# Patient Record
Sex: Male | Born: 1935 | Race: Black or African American | Hispanic: No | Marital: Married | Smoking: Current every day smoker
Health system: Southern US, Community
[De-identification: ages and names within clinical notes are randomized; demographics above are authoritative.]

## PROBLEM LIST (undated history)

## (undated) ENCOUNTER — Emergency Department (HOSPITAL_COMMUNITY): Payer: Medicare Other

## (undated) DIAGNOSIS — I2510 Atherosclerotic heart disease of native coronary artery without angina pectoris: Secondary | ICD-10-CM

## (undated) DIAGNOSIS — G119 Hereditary ataxia, unspecified: Secondary | ICD-10-CM

## (undated) DIAGNOSIS — D649 Anemia, unspecified: Secondary | ICD-10-CM

## (undated) DIAGNOSIS — R29898 Other symptoms and signs involving the musculoskeletal system: Secondary | ICD-10-CM

## (undated) DIAGNOSIS — C61 Malignant neoplasm of prostate: Secondary | ICD-10-CM

## (undated) DIAGNOSIS — I639 Cerebral infarction, unspecified: Secondary | ICD-10-CM

## (undated) DIAGNOSIS — N189 Chronic kidney disease, unspecified: Secondary | ICD-10-CM

## (undated) DIAGNOSIS — I10 Essential (primary) hypertension: Secondary | ICD-10-CM

## (undated) DIAGNOSIS — C801 Malignant (primary) neoplasm, unspecified: Secondary | ICD-10-CM

## (undated) DIAGNOSIS — R0789 Other chest pain: Secondary | ICD-10-CM

## (undated) HISTORY — DX: Hereditary ataxia, unspecified: G11.9

## (undated) HISTORY — DX: Other symptoms and signs involving the musculoskeletal system: R29.898

## (undated) HISTORY — DX: Cerebral infarction, unspecified: I63.9

## (undated) HISTORY — DX: Chronic kidney disease, unspecified: N18.9

---
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
## 2003-08-23 ENCOUNTER — Encounter: Admission: RE | Admit: 2003-08-23 | Discharge: 2003-08-23 | Payer: Self-pay | Admitting: General Practice

## 2004-04-17 ENCOUNTER — Emergency Department (HOSPITAL_COMMUNITY): Admission: EM | Admit: 2004-04-17 | Discharge: 2004-04-17 | Payer: Self-pay | Admitting: Emergency Medicine

## 2004-04-29 ENCOUNTER — Emergency Department (HOSPITAL_COMMUNITY): Admission: EM | Admit: 2004-04-29 | Discharge: 2004-04-29 | Payer: Self-pay | Admitting: Emergency Medicine

## 2004-06-05 ENCOUNTER — Ambulatory Visit (HOSPITAL_COMMUNITY): Admission: RE | Admit: 2004-06-05 | Discharge: 2004-06-05 | Payer: Self-pay | Admitting: Urology

## 2004-06-17 ENCOUNTER — Ambulatory Visit: Admission: RE | Admit: 2004-06-17 | Discharge: 2004-09-15 | Payer: Self-pay | Admitting: Radiation Oncology

## 2004-09-16 ENCOUNTER — Ambulatory Visit: Admission: RE | Admit: 2004-09-16 | Discharge: 2004-12-06 | Payer: Self-pay | Admitting: Radiation Oncology

## 2006-01-05 ENCOUNTER — Emergency Department (HOSPITAL_COMMUNITY): Admission: EM | Admit: 2006-01-05 | Discharge: 2006-01-06 | Payer: Self-pay | Admitting: Emergency Medicine

## 2006-07-07 ENCOUNTER — Ambulatory Visit (HOSPITAL_COMMUNITY): Admission: RE | Admit: 2006-07-07 | Discharge: 2006-07-07 | Payer: Self-pay | Admitting: Family Medicine

## 2006-11-14 ENCOUNTER — Inpatient Hospital Stay (HOSPITAL_COMMUNITY): Admission: EM | Admit: 2006-11-14 | Discharge: 2006-11-17 | Payer: Self-pay | Admitting: Emergency Medicine

## 2006-11-16 ENCOUNTER — Ambulatory Visit: Payer: Self-pay | Admitting: Vascular Surgery

## 2006-11-16 ENCOUNTER — Encounter (INDEPENDENT_AMBULATORY_CARE_PROVIDER_SITE_OTHER): Payer: Self-pay | Admitting: Interventional Cardiology

## 2006-12-24 ENCOUNTER — Encounter: Payer: Self-pay | Admitting: Internal Medicine

## 2006-12-24 DIAGNOSIS — C180 Malignant neoplasm of cecum: Secondary | ICD-10-CM

## 2007-01-08 DIAGNOSIS — C801 Malignant (primary) neoplasm, unspecified: Secondary | ICD-10-CM

## 2007-01-08 HISTORY — DX: Malignant (primary) neoplasm, unspecified: C80.1

## 2007-01-19 ENCOUNTER — Ambulatory Visit: Payer: Self-pay | Admitting: Internal Medicine

## 2007-02-06 ENCOUNTER — Inpatient Hospital Stay (HOSPITAL_COMMUNITY): Admission: RE | Admit: 2007-02-06 | Discharge: 2007-02-11 | Payer: Self-pay | Admitting: General Surgery

## 2007-02-06 HISTORY — PX: CHOLECYSTECTOMY: SHX55

## 2007-02-06 HISTORY — PX: HEMICOLECTOMY: SHX854

## 2007-02-07 ENCOUNTER — Encounter (INDEPENDENT_AMBULATORY_CARE_PROVIDER_SITE_OTHER): Payer: Self-pay | Admitting: General Surgery

## 2007-02-18 ENCOUNTER — Ambulatory Visit: Payer: Self-pay | Admitting: Oncology

## 2007-03-11 LAB — CBC WITH DIFFERENTIAL/PLATELET
BASO%: 0.6 % (ref 0.0–2.0)
EOS%: 19.3 % — ABNORMAL HIGH (ref 0.0–7.0)
Eosinophils Absolute: 1.2 10*3/uL — ABNORMAL HIGH (ref 0.0–0.5)
MCHC: 31.8 g/dL — ABNORMAL LOW (ref 32.0–35.9)
MCV: 75.3 fL — ABNORMAL LOW (ref 81.6–98.0)
MONO%: 7.3 % (ref 0.0–13.0)
NEUT#: 3 10*3/uL (ref 1.5–6.5)
RBC: 4.19 10*6/uL — ABNORMAL LOW (ref 4.20–5.71)
RDW: 24.3 % — ABNORMAL HIGH (ref 11.2–14.6)

## 2007-03-14 LAB — IRON AND TIBC
%SAT: 12 % — ABNORMAL LOW (ref 20–55)
TIBC: 435 ug/dL (ref 215–435)

## 2007-03-14 LAB — FERRITIN: Ferritin: 16 ng/mL — ABNORMAL LOW (ref 22–322)

## 2007-03-14 LAB — COMPREHENSIVE METABOLIC PANEL
ALT: 11 U/L (ref 0–53)
AST: 15 U/L (ref 0–37)
Albumin: 4.3 g/dL (ref 3.5–5.2)
Alkaline Phosphatase: 90 U/L (ref 39–117)
Glucose, Bld: 70 mg/dL (ref 70–99)
Potassium: 3.9 mEq/L (ref 3.5–5.3)
Sodium: 139 mEq/L (ref 135–145)
Total Protein: 8.2 g/dL (ref 6.0–8.3)

## 2007-03-14 LAB — TRANSFERRIN RECEPTOR, SOLUABLE: Transferrin Receptor, Soluble: 41.3 nmol/L

## 2007-04-04 ENCOUNTER — Ambulatory Visit (HOSPITAL_COMMUNITY): Admission: RE | Admit: 2007-04-04 | Discharge: 2007-04-04 | Payer: Self-pay | Admitting: Oncology

## 2007-04-07 ENCOUNTER — Ambulatory Visit: Payer: Self-pay | Admitting: Oncology

## 2007-05-20 ENCOUNTER — Ambulatory Visit: Payer: Self-pay | Admitting: Oncology

## 2007-12-16 IMAGING — CR DG CHEST 2V
2 series · 2 of 2 positions shown · non-contrast
Comparison: 11/15/06.

CLINICAL DATA: Colon cancer.  Preop radiograph. 
 CHEST ? 2 VIEW:

[w chest pa]
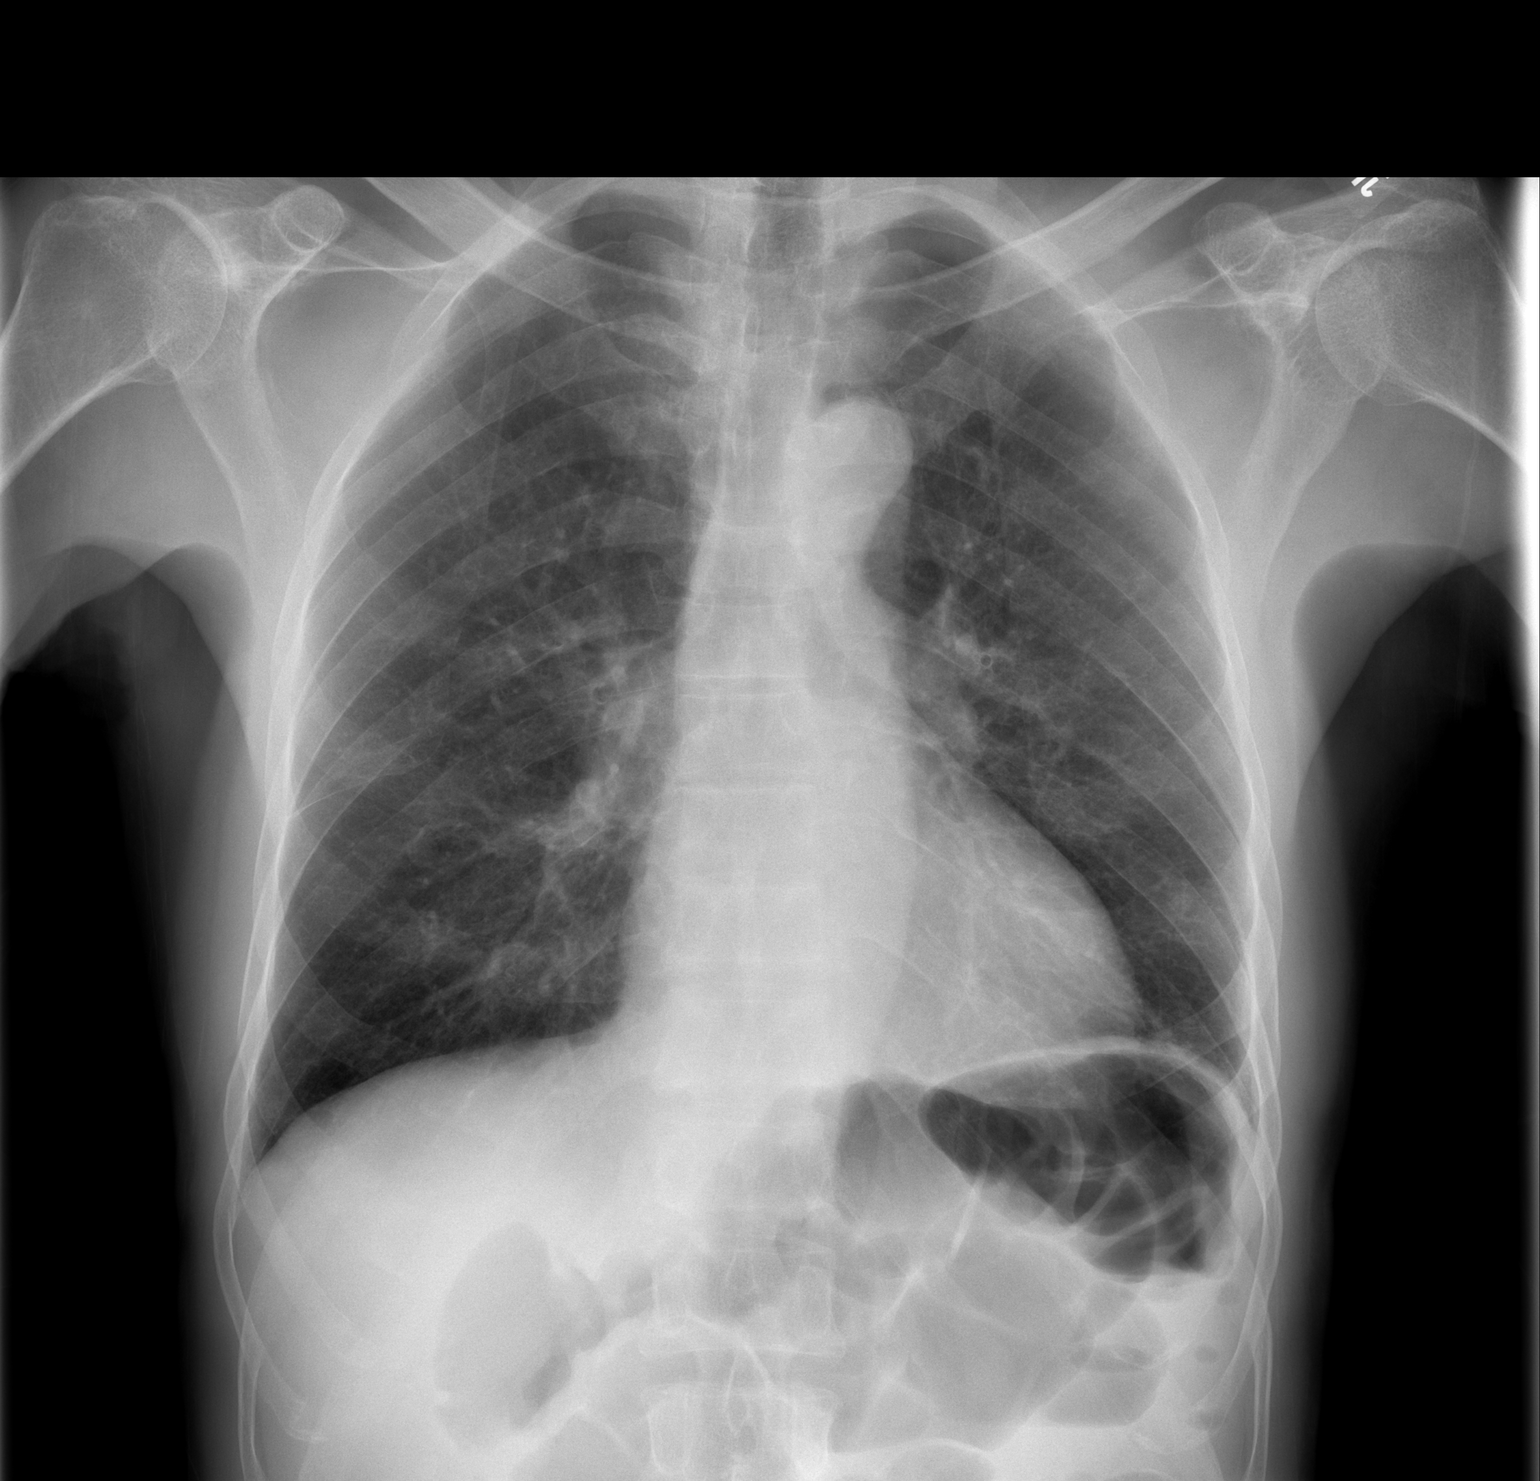

[w chest lat]
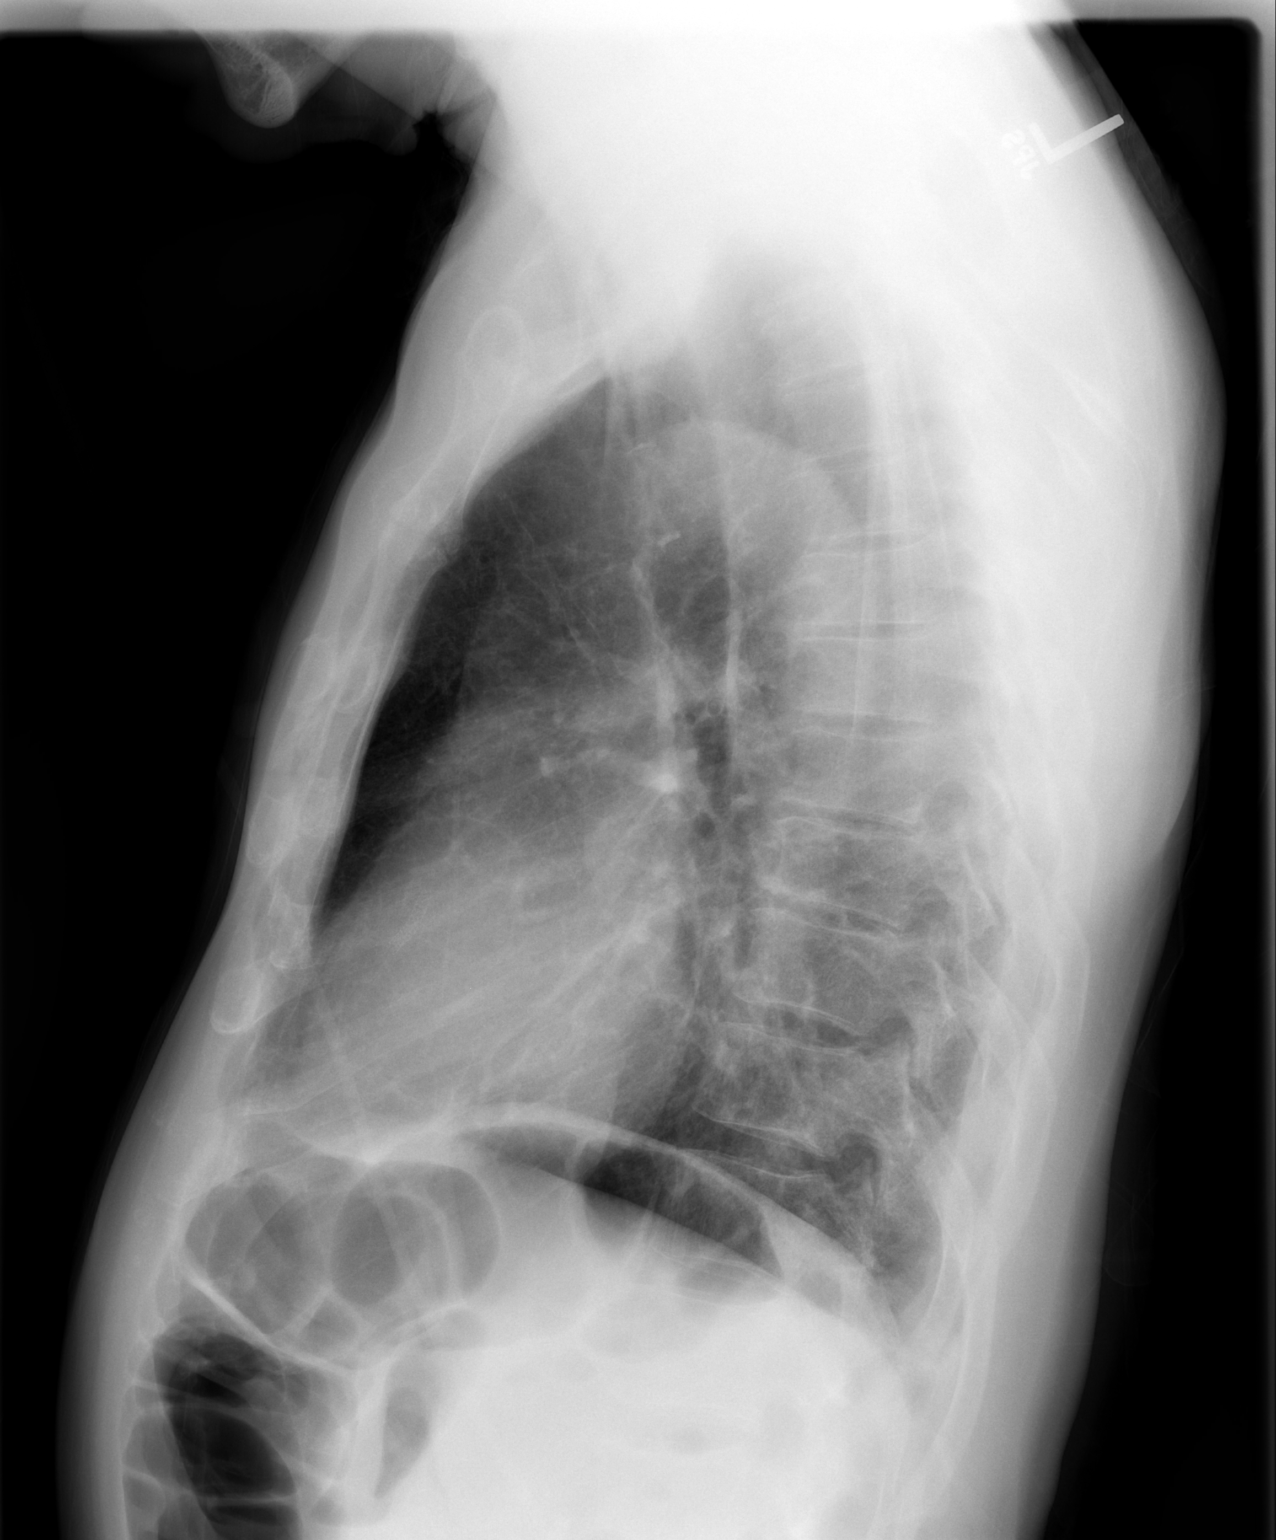

[2 of 2 positions shown; findings below may reference images not displayed]

FINDINGS: Heart size is mildly enlarged.  
 There is no pleural fluid or interstitial edema.  
 No airspace opacities are identified. 
 There are coarsened interstitial markings consistent with COPD/emphysema.
IMPRESSION: 1.  Chronic interstitial change.  
 2.  No acute cardiopulmonary abnormalities.

## 2008-03-02 ENCOUNTER — Emergency Department (HOSPITAL_COMMUNITY): Admission: EM | Admit: 2008-03-02 | Discharge: 2008-03-02 | Payer: Self-pay | Admitting: Emergency Medicine

## 2009-11-11 ENCOUNTER — Inpatient Hospital Stay (HOSPITAL_COMMUNITY)
Admission: EM | Admit: 2009-11-11 | Discharge: 2009-11-22 | Payer: Self-pay | Source: Home / Self Care | Admitting: Emergency Medicine

## 2010-03-09 DIAGNOSIS — I639 Cerebral infarction, unspecified: Secondary | ICD-10-CM

## 2010-03-09 HISTORY — DX: Cerebral infarction, unspecified: I63.9

## 2010-03-30 ENCOUNTER — Encounter (HOSPITAL_COMMUNITY): Payer: Self-pay | Admitting: Oncology

## 2010-04-29 ENCOUNTER — Emergency Department (HOSPITAL_COMMUNITY)
Admission: EM | Admit: 2010-04-29 | Discharge: 2010-04-29 | Disposition: A | Discharge status: Home / Self Care | Payer: No Typology Code available for payment source | Attending: Emergency Medicine | Admitting: Emergency Medicine

## 2010-04-29 ENCOUNTER — Emergency Department (HOSPITAL_COMMUNITY): Payer: No Typology Code available for payment source

## 2010-04-29 ENCOUNTER — Encounter (HOSPITAL_COMMUNITY): Payer: Self-pay | Admitting: Radiology

## 2010-04-29 DIAGNOSIS — E7800 Pure hypercholesterolemia, unspecified: Secondary | ICD-10-CM | POA: Insufficient documentation

## 2010-04-29 DIAGNOSIS — R1011 Right upper quadrant pain: Secondary | ICD-10-CM | POA: Insufficient documentation

## 2010-04-29 DIAGNOSIS — S20219A Contusion of unspecified front wall of thorax, initial encounter: Secondary | ICD-10-CM | POA: Insufficient documentation

## 2010-04-29 DIAGNOSIS — M542 Cervicalgia: Secondary | ICD-10-CM | POA: Insufficient documentation

## 2010-04-29 DIAGNOSIS — I10 Essential (primary) hypertension: Secondary | ICD-10-CM | POA: Insufficient documentation

## 2010-04-29 DIAGNOSIS — IMO0001 Reserved for inherently not codable concepts without codable children: Secondary | ICD-10-CM | POA: Insufficient documentation

## 2010-04-29 HISTORY — DX: Essential (primary) hypertension: I10

## 2010-04-29 LAB — POCT I-STAT, CHEM 8
BUN: 20 mg/dL (ref 6–23)
Potassium: 3.8 mEq/L (ref 3.5–5.1)
Sodium: 139 mEq/L (ref 135–145)
TCO2: 24 mmol/L (ref 0–100)

## 2010-04-29 MED ORDER — IOHEXOL 300 MG/ML  SOLN
100.0000 mL | Freq: Once | INTRAMUSCULAR | Status: AC | PRN
  Administered 2010-04-29: 100 mL via INTRAVENOUS

## 2010-05-22 LAB — URINE MICROSCOPIC-ADD ON

## 2010-05-22 LAB — COMPREHENSIVE METABOLIC PANEL
ALT: 10 U/L (ref 0–53)
ALT: 10 U/L (ref 0–53)
AST: 19 U/L (ref 0–37)
Albumin: 3.5 g/dL (ref 3.5–5.2)
Albumin: 4 g/dL (ref 3.5–5.2)
Alkaline Phosphatase: 79 U/L (ref 39–117)
Alkaline Phosphatase: 81 U/L (ref 39–117)
BUN: 15 mg/dL (ref 6–23)
CO2: 27 mEq/L (ref 19–32)
Calcium: 10.7 mg/dL — ABNORMAL HIGH (ref 8.4–10.5)
Chloride: 102 mEq/L (ref 96–112)
Chloride: 104 mEq/L (ref 96–112)
Creatinine, Ser: 1.42 mg/dL (ref 0.4–1.5)
GFR calc Af Amer: 58 mL/min — ABNORMAL LOW (ref >60)
GFR calc non Af Amer: 48 mL/min — ABNORMAL LOW (ref >60)
Glucose, Bld: 135 mg/dL — ABNORMAL HIGH (ref 70–99)
Glucose, Bld: 144 mg/dL — ABNORMAL HIGH (ref 70–99)
Potassium: 3.8 mEq/L (ref 3.5–5.1)
Potassium: 4 mEq/L (ref 3.5–5.1)
Sodium: 137 mEq/L (ref 135–145)
Sodium: 139 mEq/L (ref 135–145)
Total Bilirubin: 0.5 mg/dL (ref 0.3–1.2)
Total Protein: 7.8 g/dL (ref 6.0–8.3)
Total Protein: 8.6 g/dL — ABNORMAL HIGH (ref 6.0–8.3)

## 2010-05-22 LAB — BASIC METABOLIC PANEL
BUN: 11 mg/dL (ref 6–23)
BUN: 18 mg/dL (ref 6–23)
BUN: 23 mg/dL (ref 6–23)
CO2: 24 mEq/L (ref 19–32)
CO2: 24 mEq/L (ref 19–32)
CO2: 28 mEq/L (ref 19–32)
CO2: 29 mEq/L (ref 19–32)
CO2: 30 mEq/L (ref 19–32)
Calcium: 8.9 mg/dL (ref 8.4–10.5)
Calcium: 10.1 mg/dL (ref 8.4–10.5)
Calcium: 10.4 mg/dL (ref 8.4–10.5)
Calcium: 9.1 mg/dL (ref 8.4–10.5)
Calcium: 9.4 mg/dL (ref 8.4–10.5)
Calcium: 9.5 mg/dL (ref 8.4–10.5)
Chloride: 103 mEq/L (ref 96–112)
Chloride: 103 mEq/L (ref 96–112)
Creatinine, Ser: 1.04 mg/dL (ref 0.4–1.5)
Creatinine, Ser: 1.11 mg/dL (ref 0.4–1.5)
Creatinine, Ser: 1.36 mg/dL (ref 0.4–1.5)
Creatinine, Ser: 1.37 mg/dL (ref 0.4–1.5)
GFR calc Af Amer: >60 mL/min (ref >60)
GFR calc Af Amer: >60 mL/min (ref >60)
GFR calc Af Amer: >60 mL/min (ref >60)
GFR calc Af Amer: >60 mL/min (ref >60)
GFR calc Af Amer: >60 mL/min (ref >60)
GFR calc non Af Amer: >60 mL/min (ref >60)
GFR calc non Af Amer: 50 mL/min — ABNORMAL LOW (ref >60)
GFR calc non Af Amer: >60 mL/min (ref >60)
GFR calc non Af Amer: >60 mL/min (ref >60)
Glucose, Bld: 113 mg/dL — ABNORMAL HIGH (ref 70–99)
Glucose, Bld: 127 mg/dL — ABNORMAL HIGH (ref 70–99)
Glucose, Bld: 129 mg/dL — ABNORMAL HIGH (ref 70–99)
Glucose, Bld: 137 mg/dL — ABNORMAL HIGH (ref 70–99)
Potassium: 4.1 mEq/L (ref 3.5–5.1)
Potassium: 3.8 mEq/L (ref 3.5–5.1)
Potassium: 4.4 mEq/L (ref 3.5–5.1)
Sodium: 131 mEq/L — ABNORMAL LOW (ref 135–145)
Sodium: 135 mEq/L (ref 135–145)
Sodium: 140 mEq/L (ref 135–145)
Sodium: 143 mEq/L (ref 135–145)

## 2010-05-22 LAB — CBC
HCT: 37 % — ABNORMAL LOW (ref 39.0–52.0)
HCT: 37.3 % — ABNORMAL LOW (ref 39.0–52.0)
HCT: 39 % (ref 39.0–52.0)
HCT: 40.5 % (ref 39.0–52.0)
HCT: 42.3 % (ref 39.0–52.0)
HCT: 44.9 % (ref 39.0–52.0)
Hemoglobin: 12.8 g/dL — ABNORMAL LOW (ref 13.0–17.0)
Hemoglobin: 12.8 g/dL — ABNORMAL LOW (ref 13.0–17.0)
Hemoglobin: 13.3 g/dL (ref 13.0–17.0)
Hemoglobin: 13.4 g/dL (ref 13.0–17.0)
Hemoglobin: 15.2 g/dL (ref 13.0–17.0)
MCH: 28.7 pg (ref 26.0–34.0)
MCH: 28.9 pg (ref 26.0–34.0)
MCH: 29 pg (ref 26.0–34.0)
MCH: 29.2 pg (ref 26.0–34.0)
MCH: 29.5 pg (ref 26.0–34.0)
MCHC: 32.8 g/dL (ref 30.0–36.0)
MCHC: 33.1 g/dL (ref 30.0–36.0)
MCHC: 33.2 g/dL (ref 30.0–36.0)
MCHC: 34.3 g/dL (ref 30.0–36.0)
MCV: 84.5 fL (ref 78.0–100.0)
MCV: 85 fL (ref 78.0–100.0)
MCV: 87.4 fL (ref 78.0–100.0)
Platelets: 146 10*3/uL — ABNORMAL LOW (ref 150–400)
Platelets: 146 10*3/uL — ABNORMAL LOW (ref 150–400)
Platelets: 168 10*3/uL (ref 150–400)
Platelets: 185 10*3/uL (ref 150–400)
RBC: 4.38 MIL/uL (ref 4.22–5.81)
RBC: 4.39 MIL/uL (ref 4.22–5.81)
RBC: 4.46 MIL/uL (ref 4.22–5.81)
RBC: 4.91 MIL/uL (ref 4.22–5.81)
RBC: 5.16 MIL/uL (ref 4.22–5.81)
RDW: 13.9 % (ref 11.5–15.5)
RDW: 13.9 % (ref 11.5–15.5)
RDW: 14.3 % (ref 11.5–15.5)
WBC: 4.4 10*3/uL (ref 4.0–10.5)
WBC: 4.8 10*3/uL (ref 4.0–10.5)
WBC: 5.5 10*3/uL (ref 4.0–10.5)
WBC: 7 10*3/uL (ref 4.0–10.5)

## 2010-05-22 LAB — POCT CARDIAC MARKERS
CKMB, poc: <1 ng/mL — ABNORMAL LOW (ref 1.0–8.0)
Myoglobin, poc: 54.3 ng/mL (ref 12–200)
Troponin i, poc: <0.05 ng/mL (ref 0.00–0.09)

## 2010-05-22 LAB — DIFFERENTIAL
Basophils Absolute: 0 10*3/uL (ref 0.0–0.1)
Basophils Relative: 0 % (ref 0–1)
Basophils Relative: 0 % (ref 0–1)
Eosinophils Absolute: 0.1 10*3/uL (ref 0.0–0.7)
Eosinophils Absolute: 0.2 10*3/uL (ref 0.0–0.7)
Eosinophils Relative: 2 % (ref 0–5)
Lymphocytes Relative: 7 % — ABNORMAL LOW (ref 12–46)
Lymphs Abs: 0.6 10*3/uL — ABNORMAL LOW (ref 0.7–4.0)
Monocytes Absolute: 0.4 10*3/uL (ref 0.1–1.0)
Monocytes Absolute: 0.6 10*3/uL (ref 0.1–1.0)
Monocytes Relative: 12 % (ref 3–12)
Monocytes Relative: 5 % (ref 3–12)
Neutro Abs: 7.5 10*3/uL (ref 1.7–7.7)
Neutrophils Relative %: 86 % — ABNORMAL HIGH (ref 43–77)

## 2010-05-22 LAB — URINE CULTURE
Colony Count: NO GROWTH
Culture  Setup Time: 201109060043
Culture: NO GROWTH

## 2010-05-22 LAB — LACTIC ACID, PLASMA: Lactic Acid, Venous: 2.1 mmol/L (ref 0.5–2.2)

## 2010-05-22 LAB — LIPASE, BLOOD: Lipase: 21 U/L (ref 11–59)

## 2010-05-22 LAB — URINALYSIS, ROUTINE W REFLEX MICROSCOPIC
Glucose, UA: NEGATIVE mg/dL
Specific Gravity, Urine: 1.026 (ref 1.005–1.030)
Urobilinogen, UA: 1 mg/dL (ref 0.0–1.0)
pH: 5.5 (ref 5.0–8.0)

## 2010-05-22 LAB — T3: T3, Total: 56.5 ng/dl — ABNORMAL LOW (ref 80.0–204.0)

## 2010-05-22 LAB — TSH: TSH: 0.906 u[IU]/mL (ref 0.350–4.500)

## 2010-05-22 LAB — MAGNESIUM: Magnesium: 2 mg/dL (ref 1.5–2.5)

## 2010-07-22 NOTE — H&P (Signed)
Glen, [name] NO.:  [id number]   MEDICAL RECORD NO.:  [id number]          PATIENT TYPE:  INP   LOCATION:  NA                           FACILITY:  [facility]   PHYSICIAN:  Gwenyth Ober, M.D.    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   HISTORY OF PRESENT ILLNESS:  I first met Mr. [name] on January 21, 2007,  when he was seen in my office after referral from Dr. Teena Irani,  gastroenterologist for the [facility].  At that time I was seeing  him after he had been hospitalized in September 2008 with some  myocardial ischemia, requiring stent.  He was anemic at that time, and  further workup demonstrated that the patient had a cecal mass, likely  cecal cancer, along with some benign tubular adenomas of the sigmoid  colon.  He was sent to [place] for evaluation for possible right colectomy.  Unfortunately about a month ago, the patient's son died of complications  after surgery for colon cancer, and the patient came into my office on  the fourth very reluctant considering surgical intervention.   PAST MEDICAL HISTORY:  Significant for:  1. Previous ST-segment elevated myocardial infarction with      percutaneous coronary intervention with a bare metal stent.  2. Anemia, likely secondary to his cecal cancer.  3. History of prostate CA.  He has received intensity modulated      radiation therapy.  4. Hypertension  5. Hypercholesterolemia.   PAST SURGERIES:  He had the cardiac catheterization back in September  2008 but no other previous procedures.   PAST HISTORY:  Significant as we mentioned before.   MEDICATIONS:   When he came into see me:  1. Simvastatin 80 mg once a day.  2. Amlodipine 5 mg a day.  3. Metoprolol 50 mg p.o. daily.  4. Nitroglycerin p.r.n.   ALLERGIES:  NO KNOWN DRUG ALLERGIES.   SOCIAL HISTORY:  He is a half-pack a day smoker and nondrinker.   FAMILY HISTORY:  Significant for colon cancer in a son who died recently  from complications secondary to surgery.   REVIEW OF SYSTEMS:  The patient has history of heart disease, high blood  pressure, and colon cancer and skin cancer in the family.   PHYSICAL EXAMINATION:  VITAL SIGNS:  Last saw him in my office he had  his weight of 145 pounds, is 5 feet 8 inches tall and blood pressure was  120/63, pulse of 53.  HEENT: He is normocephalic and atraumatic.  He had no scleral icterus.  His mucous membranes were moist and pink.  NECK:  He had no carotid bruits.  He had no palpable cervical or  supraclavicular or scaly masses.  No adenopathy.  He had no  palpable  thyroid disease.  LUNG:  Clear to auscultation with normal excursion.  CARDIAC:  Regular rhythm and rate with no murmurs, no gallops, no lifts,  no heaves.  ABDOMEN:  Soft with mild distention, but he had good bowel sounds.  He  had no tenderness.  He had no rebound.  He had no guarding.  RECTAL:  Positive for blood.  No palpable masses.   LABORATORY:  I did review all the data sent over with him from his  office.  At that time he had a CT scan of the abdomen and pelvis done on  July 07, 2006 which showed he had a soft tissue cecum mass concerning  for colon cancer.  He also had gallstones.  There was some changes in  his lungs.  Pelvic CT was negative.  Biopsy of his cecal mass  demonstrated adenocarcinoma, and the colon polyp with diagnosis of  tubulovillous adenoma.  Pictures from the colonoscopy are available on  the patient's chart.  Recent laboratory studies show that his hemoglobin  was less than 8.0, and therefore, I am admitting the patient a day early  prior to  surgery in order although for [place] to give him blood prior to his  surgical intervention.  There is no evidence of any metastasis, and the  patient will require CEA level on admission.   IMPRESSION:  Adenocarcinoma of the cecum, requiring resection.  The  patient was recently diagnosed with coronary disease, manifested  primarily because of anemia.  The plan is to admit him a day before  surgery, give him blood and make sure that he is ready for surgery.  Give him a bowel prep during the day of surgery, but started him clear  liquids 48 hours prior to surgery.  Then we will go ahead do a right  colectomy as planned previously.  The risk and benefits have been  explained to the patient and his wife, and they wish to proceed.      Gwenyth Ober, M.D.  Electronically Signed     JOW/MEDQ  D:  02/04/2007  T:  02/04/2007  Job:  [id number]

## 2010-07-22 NOTE — Op Note (Signed)
NAMECONWELL, VITO NO.:  [id number]   MEDICAL RECORD NO.:  [id number]          PATIENT TYPE:  INP   LOCATION:  5703                         FACILITY:  [facility]   PHYSICIAN:  Gwenyth Ober, M.D.    DATE OF BIRTH:  Oct 20, 1930   DATE OF PROCEDURE:  02/07/2007  DATE OF DISCHARGE:                               OPERATIVE REPORT   PREOPERATIVE DIAGNOSIS:  Right colon cancer.   POSTOPERATIVE DIAGNOSES:  1. Right colon cancer.  2. Cholelithiasis.   PROCEDURES:  1. Right hemicolectomy.  2. Cholecystectomy.   SURGEON:  Gwenyth Ober, M.D.   ASSISTANT:  Sammuel Hines. Daiva Nakayama, M.D.   ANESTHESIA:  General endotracheal.   ESTIMATED BLOOD LOSS:  Less than 50 mL.   COMPLICATIONS:  None.   CONDITION:  Stable.   INDICATIONS FOR OPERATION:  The patient is a 77 year old male who was  admitted earlier this year with chest pain and anemia.  Subsequent  workup demonstrated cardiac disease and a right colon cancer.  He is now  being admitted for cholecystectomy.   FINDINGS AT SURGERY:  The patient was found to have a very large right  colon or cecal mass which was not fixed to surrounding structures but  well encased inside the colon on the right side.  He also had palpable  gallstones.  There was no evidence of metastasis to the liver, stomach,  the pelvis or any other intra-abdominal structure.   OPERATION:  The patient was taken to the operating room, placed on table  in supine position.  After an adequate general endotracheal anesthetic  was administered, he was prepped and draped in the usual sterile manner  exposing the midline.   A midline incision was made from about 3 cm below the xiphoid to about 1  cm below the umbilicus.  It was taken down through the midline fascia  using electrocautery.  Once we had of the peritoneal cavity, we  inspected the abdomen carefully, visually and palpably.  There was a  large mass in the right lower quadrant consistent with the  patient's  preoperative diagnosis of a cecal or right colon cancer.  There was a  palpable gallstone in the gallbladder but no evidence of any metastatic  disease to the surrounding area, liver or to the perihepatic area.  The  stomach was palpably normal and an orogastric tube could be palpated in  the stomach.  The spleen was normal but was not visually inspected.  We  palpated the rest of the colon, could not palpate any other masses in  the colon.  The small bowel was run from the ligament of Treitz down to  the terminal ileum and no findings were noted.   It was noted that the terminal ileum was tethered to the sigmoid colon  through adhesions and scar tissue which we had to remove in order to get  an adequate mobilization of the terminal ileum for resection and  anastomosis.   We dissected the right colon at the line of Toldt and rotated it  medially as we came upon the hepatic  flexure.  At that point, part of  the omentum was encountered which was actually detached from the liver  and the colon using a LigaSure device.  We dissected up to the  midportion of the transverse colon where we came across the colon with a  GIA 75 blue stapler.   The terminal ileum, the appendix and cecum had to be detached from its  surrounding peritoneal attachments including some scar tissue to the  redundant sigmoid colon.  This was done carefully and we subsequently  came across the terminal ileum using a GIA 75 stapler.  The mesentery  between the mid transverse colon and the terminal ileum was taken using  a LigaSure device with the exception of the large ileocolic vessel which  was ligated with a double ligature of 2-0 silk ties.  Once this was done  and we had taken the mesentery, we removed specimen.  We then removed  the gallbladder in a retrograde manner, isolating initially the cystic  duct, ligating that with large clips x2, transecting the cystic duct,  finding the cystic artery,  clipping it and then dissected out the  gallbladder retrograde with minimal difficulty.  We packed that area as  we went ahead and did our anastomosis between the transverse colon and  the terminal ileum.  This was done using a GIA 75 stapler with a TX-60  stapler being used to close off the resulting enterotomy.  The mesentery  was closed using interrupted figure-of-eight stitches of 2-0 silk.  We  allowed this to fall back into the right paracolic area after inspecting  that area for bleeding and finding minimal bleeding that required any  significant attention.  We changed the surgeon's gloves and assistant's  gloves and we irrigated with saline solution.  We inspected the hepatic  bed which was not bleeding.  We removed all packs and then closed.   We closed the fascia using a running looped #1 PDS suture.  The subcu  was irrigated with saline and the skin was closed using stainless steel  staples.  Sterile dressing was applied.  All needle counts, sponge  counts and instrument counts were correct.      Gwenyth Ober, M.D.  Electronically Signed     JOW/MEDQ  D:  02/07/2007  T:  02/07/2007  Job:  [id number]

## 2010-07-22 NOTE — Discharge Summary (Signed)
NAMEGARIEL, GLASTETTER NO.:  [id number]   MEDICAL RECORD NO.:  [id number]          PATIENT TYPE:  INP   LOCATION:  [id number]                         FACILITY:  [facility]   PHYSICIAN:  Gwenyth Ober, M.D.    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   PRINCIPAL PROCEDURE:  By Dr. Hulen Skains was a right hemicolectomy and a  cholecystectomy.  He also had a diagnosis of gallstones.   He was discharged to home in the care of his family.  At discharge, he  was placed back on his preoperative medications in addition to taking  Percocet to be taken for pain.  He is to return to see Dr. Hulen Skains on  February 15, 2007.   BRIEF SUMMARY OF HOSPITAL COURSE:  The patient was admitted the day of  surgery on February 06, 2007 for a right colectomy and a  cholecystectomy.  He had gallstones noted.  Postoperatively, he did well  on postoperative day #1.  His hemoglobin was 10.5.  Postoperative day  #2, his hemoglobin remained stable.  Postoperative day #3, he was doing  well.  He was on the Entereg postoperative bowel study and on a  postoperative motility drug which he tolerated well postoperative day  #4.  He was allowed to go home, tolerating a diet well, eating well with  minimal difficulty.  He was return to clinic to see Dr. Hulen Skains on  February 15, 2007.      Gwenyth Ober, M.D.  Electronically Signed     JOW/MEDQ  D:  03/18/2007  T:  03/18/2007  Job:  [id number]

## 2010-07-22 NOTE — Cardiovascular Report (Signed)
[name], [name] NO.:  [id number]   MEDICAL RECORD NO.:  [id number]          PATIENT TYPE:  INP   LOCATION:  2109                         FACILITY:  [facility]   PHYSICIAN:  Belva Crome, M.D.   DATE OF BIRTH:  05-26-1930   DATE OF PROCEDURE:  DATE OF DISCHARGE:                            CARDIAC CATHETERIZATION   INDICATIONS:  Probable acute coronary syndrome affecting the inferior  wall.   PROCEDURE PERFORMED:  1. Left heart mid to selective coronary angio.  2. Left ventriculography.  3. Cath with thrombectomy.  4. Bare metal stent.   DESCRIPTION:  After informed consent, the patient was emergently brought  to the cath lab where a 6-French sheath was started on the right femoral  artery using the modified Seldinger technique.  A 6-French A2  multipurpose catheter was used for hemodynamic recordings and left  ventriculography.  We then used preformed 6-French left Judkins  catheters for left coronary angiography.  He used a 6-French side-hole  Judkins right guide catheter for right coronary angiography.   We demonstrated total occlusion of the right coronary and quickly gave  the patient a bolus followed by an infusion of AngioMax.  He was loaded  with 600 mg of Plavix.  PCI was then performed using a hockey-stick 6-  [place] guide catheter as the side hole right catheter did not give good  support.  We were able to cross the stenosis with some difficulty.  We  used a FETCH thrombectomy catheter for two thrombectomy runs.  The  catheter would initially not cross the lesion, and we therefore used a 2-  0 balloon to predilate the lesion.  We then deployed an 18 mm long x  2.75 mm mini Vision stent in the mid RCA extending back to the proximal  RCA and postdilated to 3.00 mm using a 12-mm long 3.0 Quantum balloon.  We postdilated to 15 atmospheres.  Angio-Seal was used for closure.  TIMI grade 3 flow was noted.  Diffuse disease with up to 50% was noted  residually in the mid RCA.   Angio-Seal closure with good hemostasis.   RESULTS:  1. Hemodynamic data:      a.     Aortic pressure 148/67.      b.     Left ventricular pressure 148/16.  2. Left ventriculography:  Moderate inferior hypokinesis.  EF 60%.  3. Coronary angiography.      a.     Left main coronary widely patent.      b.     Left anterior descending coronary:  Widely patent with the       vessel wrapped around left ventricular apex, first diagonal,       contains eccentric 60-70% stenosis in the first diagonal.  The       diagonal is moderate in size.      c.     Circumflex artery:  Circumflex mid vessel, after the first       obtuse marginal, contains between 50-90% stenosis over the segment       that also involves the second obtuse  marginal.  The marginal       branch is moderate in size.      d.     Right coronary:  RCA is totally occluded proximally, distal       left-to-right collaterals are noted.  4. PCI:  100% mid RCA lesion reduced to 0% with TIMI grade 3 flow      following the intervention as outlined above.  Bare metal stent was      used because of the patient's comorbidities.  Residual 50% stenosis      was left in the distal portion of the mid right coronary.   CONCLUSIONS:  1. Acute coronary syndrome with inferior infarction, left coronary to      right coronary collaterals to the RCA prevented significant ST      elevation  2. Inferior wall motion abnormality.  3. Successful PCI on the right coronary with reduction in stenosis      from 100% to 0% residual, 50% stenosis is noted in the midvessel.  4. Severe disease involving the proximal/mid circumflex segmentally.  5. Widely patent LAD with 70-80% stenosis in the first diagonal.  6. Successful Angio-Seal.   PLAN:  Aspirin, Plavix times 2-4 weeks.  Continue beta blocker therapy.  Start statin.  Discontinue Angiomax.  Further management based upon  clinical status.      Belva Crome, M.D.   Electronically Signed     HWS/MEDQ  D:  11/14/2006  T:  11/15/2006  Job:  [id number]   cc:   Dr. Alyson Ingles

## 2010-07-22 NOTE — Assessment & Plan Note (Signed)
[facility]                         GASTROENTEROLOGY OFFICE NOTE   [name], [name]                        MRN:          [id number]  DATE:01/19/2007                            DOB:          09/01/1930    [name] is a very nice 77 year old gentleman who is brought by his  daughter for a second opinion of colon cancer.  This is actually  requested by [name], his daughter, because patient has refused  surgery.  Since we are close friends, she wanted me to give him my  opinion of how to proceed at this point.  The complicating factor in [name]' case is that his son died recently of complication of rectal  carcinoma.  He apparently had some other major medical issues including  alcohol use and his cancer was located in the rectum, requiring  colostomy.  There were multiple medical complications related to the  advanced stage of his cancer.   I have reviewed the records from [name] as well as a Consultation  report from Dr. Hulen Skains, and I have personally talked to Dr. Pernell Dupre  about [name]' cardiac catheterization which took place in September  of this year and resulted in placement of a bare metal stent after he  presented with unstable angina in the setting of the hemoglobin of 7  grams.  Dr. Tamala Julian assured me that he already talked to Dr. Hulen Skains and to  the patient and is quite sure that his cardiac output and his coronary  artery disease should not be an objection to general anesthesia.   [name] has an adenocarcinoma of the cecum which may be partial  obstructing, based on the size of the tumor by [name] and also by  patient's symptoms of constipation and right lower quadrant abdominal  pain.  His tumor causing significant GI blood loss as evidenced by his  hemoglobin of 7.  He required 7 units of packed cells while in the  hospital recently.  Other medical issues are history of prostate CA  which is under control.   MEDICATIONS:  1. Metoprolol 50 mg p.o. b.i.d.  2. Simvastatin 80 mg p.o. daily.  3. Amlodipine 5 mg p.o. daily.  4. Hyomax-SL 0.125 mg p.r.n.  5. Nitroglycerin.   PHYSICAL EXAM:  Blood pressure 112/58, pulse 64 and weight 147 pounds.  The patient was alert and oriented and very cooperative.  LUNGS:  Clear to auscultation.  COR:  With normal S1, normal S2.  ABDOMEN:  With voluntary guarding, normoactive bowel sounds, no  tenderness and no fullness in the right lower quadrant.  RECTAL EXAM:  Not done.  I anticipate patient is heme positive.  EXTREMITIES:  No edema.   IMPRESSION:  A 77 year old gentleman with well documented and histologic-  proven adenocarcinoma of the cecum without obvious liver metastasis or  spread beyond the wall of the colon.  He is an ideal candidate for cecal  right hemicolectomy, having his coronary artery disease treated recently  and being transfused to now close to normal hemoglobin.  I spent at  least 30  minutes talking to the patient in the direction of encouraging  him for surgery.  I also gave him a statistic as to if he does not have  surgery which would result in continued gastrointestinal blood loss and  eventual obstruction.  I also emphasized the fact that he ought to move  rather efficiently with his decision since he has been taken off the  Plavix and aspirin and he needs to go back on it as soon as possible  after the surgery.  Patient was concerned about possibility of colostomy  and, although we cannot rule out the possibility, the location of the  tumor and the size of it seems to make it much less likely that he would  end up with a diverting colostomy.  I am sure Dr. Hulen Skains already talked  to him about it.   PLAN:  I have encouraged [name] to go ahead and contact Dr. Hulen Skains so  he can put him on the schedule.  In the meantime, he ought to take some  Ensure or Boost as a dietary supplement and start on over-the-counter  iron supplements  to maintain his iron level.  I will be happy to talk to  him in the future if he has any questions, but at this point it is  really Dr. Richarda [name] decision as to the timing and type of the procedure.     Glen Bandy. Olevia Perches, MD  Electronically Signed    DMB/MedQ  DD: 01/19/2007  DT: 01/20/2007  Job #: [id number]   cc:   [name], M.D.  [name], M.D.  [name], M.D.  [name], M.D.

## 2010-07-22 NOTE — H&P (Signed)
[name], [name] NO.:  [id number]   MEDICAL RECORD NO.:  [id number]          PATIENT TYPE:  INP   LOCATION:  2109                         FACILITY:  [facility]   PHYSICIAN:  Belva Crome, M.D.   DATE OF BIRTH:  10/19/1930   DATE OF ADMISSION:  11/14/2006  DATE OF DISCHARGE:                              HISTORY & PHYSICAL   REASON FOR ADMISSION:  Chest pain.   SUBJECTIVE:  The patient is 55 and has a history of prostate cancer and  hypertension.  He comes in with a 4 to 6-week history of exertional  midsternal discomfort. 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  It was associated with diaphoresis  and dyspnea.  It was initially characterized as a 10/10 pain that is now  2/10.  ECG in ER revealed questionable inferior injury.   HABITS:  Positive tobacco, negative ethanol.   ALLERGIES:  None.   MEDICATIONS:  1. Metoprolol 100 mg per day.  2. Amlodipine 10 mg per day.  3. Darvocet-N 100 p.r.n. recently given for chest pain.   FAMILY HISTORY:  Positive for CVA from which his mother died at 65.  His  dad died at 17 of unknown causes. He has 7 siblings, many of whom have  hypertension.   PAST MEDICAL HISTORY:  1. Surgery for a brain tumor by Dr. Hal Neer greater than 6 years ago.  2. History of prostate cancer, no details known.  3. Hypertension.   REVIEW OF SYSTEMS:  Difficulty walking.  States he is weak on the right  side of his body.  Both of his legs hurt when he ambulates.   OBJECTIVE:   PHYSICAL EXAMINATION:  GENERAL:  The patient is an African-American  gentleman who appears older than his stated age of 54.  VITAL SIGNS:  Blood pressure 170/70, heart rate 60, respirations 16.  He  is lying flat in bed.  HEENT:  No jaundice is noted.  Extraocular movements are full.  NECK:  No JVD, carotid bruits, or adenopathy noted.  LUNGS:  Clear.  CARDIAC:  S4.  No murmur, no rub, no click.  ABDOMEN:  Soft.  No  obvious masses are noted.  Bruits are not present.  EXTREMITIES:  No edema.  Femoral pulses 2+.  Posterior tibial and  dorsalis pedis pulses are not audible.  NEUROLOGIC:  Exam reveals no focal deficits.  He is able to move his  upper and lower extremities without problems.  There is no focal cranial  nerve deficit.   EKGS raises the question of inferior angina with biphasic inferior T  waves, sinus bradycardia.   Chest x-ray reports possible pneumothorax, left lower lobe.   Creatinine is 1.7.  Hemoglobin 9.5.  INR and PTT are both normal.  Troponin I is 2.35 at point of care.  No other laboratory data are  available.   ASSESSMENT:  1. Possible acute coronary syndrome.  2. Medical comorbidities including prostate cancer, the details of      which are not apparent, and a history of brain tumor  resection, not      sure exactly when; it certainly has been greater than 5 years.  3. Renal insufficiency based upon laboratory data with creatinine of      1.7.  4. Anemia, etiology uncertain.   PLAN:  1. Emergency catheterization and PCI if clinically indicated.  2. Will withhold antithrombotic therapy for the time being as the      patient would seemingly be at high risk of bleeding to document      anatomy.  At that point, we will go forward if need be. We will go      ahead and give aspirin now.  3. Will review others.      Belva Crome, M.D.  Electronically Signed     HWS/MEDQ  D:  11/14/2006  T:  11/14/2006  Job:  [id number]   cc:   Rushie Nyhan, M.D.  Hanley Ben, M.D.

## 2010-07-22 NOTE — Consult Note (Signed)
NAMEMIKING, COVIN NO.:  [id number]   MEDICAL RECORD NO.:  [id number]          PATIENT TYPE:  INP   LOCATION:  [id number]                         FACILITY:  [facility]   PHYSICIAN:  Hanley Ben, M.D.  DATE OF BIRTH:  Aug 10, 1930   DATE OF CONSULTATION:  11/17/2006  DATE OF DISCHARGE:  11/17/2006                                 CONSULTATION   INDICATION FOR CONSULTATION:  Hematuria and prostate cancer.   The patient is 77 year old male who was admitted on November 14, 2006,  for chest pain.  He had a cardiac catheterization that showed occlusion  of the right coronary artery and he had a coronary artery stent  placement done.  He has a history of prostate cancer and was treated  with external beam from June 27 through October 28, 2004.  PSA at  diagnosis was 13.39, Gleason score 3+3.  PSA went down to 1.07 in  September 2007 and was 1.99 in March 2008.  He is scheduled for follow-  up next month.  The patient denies gross hematuria.  That he states that  he has been voiding fairly well.  He has no hesitancy or straining on  urination.   PAST MEDICAL HISTORY:  Positive for a history of heart disease, and he  had surgery for a brain tumor about 12 years ago.  He also has a history  of hypertension.   MEDICATIONS:  Metoprolol and amlodipine.   ALLERGIES:  No known drug allergies.   FAMILY HISTORY:  Positive for heart disease, hypertension, stroke.  He  has four brothers and three sisters.   SOCIAL HISTORY:  He is married, has seven children.  He has smoked for  several years and does not drink.   REVIEW OF SYSTEMS:  Positive for history of chest pain and shortness of  breath.  Everything else is negative.   PHYSICAL EXAMINATION:  This is a well-developed 77 year old male who is  in no acute distress at this time.  He is alert and oriented to time,  place and person.  Blood pressure is 151/76, pulse 50, respirations 18, temperature 98.2.  His head is normal.  He  has pink conjunctivae.  Ears, nose and throat  are within normal limits.  NECK:  Supple.  He has no cervical adenopathy, no thyromegaly.  CHEST:  Symmetrical.  LUNGS:  Fully expanded and clear to percussion and auscultation.  HEART:  Regular rhythm.  ABDOMEN:  Soft, nondistended, nontender.  He has no CVA tenderness.  Kidneys are not palpable.  He has no hepatomegaly or splenomegaly.  Bladder is not distended.  He has no inguinal hernia.  He has bilateral  nontender inguinal adenopathy.  Penis is uncircumcised.  The meatus is  normal.  Scrotum is normal in appearance.  He has no hydrocele.  The  right testis is atrophic.  The left testis is normal.  Both cords and  epididymis are within normal limits.  RECTAL:  He has no external hemorrhoids.  His sphincter tone is normal.  Prostate is firm, flat, nontender.  Seminal vesicles are not palpable   BUN  is 13, creatinine 1.28.  Hemoglobin today is 10.8, hematocrit 33.7,  WBC 5.7.  The patient had a CT scan in April 2008 that showed a tiny  cyst at the lower pole of the left kidney and a normal right kidney, and  there is a cecal soft tissue mass.   IMPRESSION:  1. Adenocarcinoma of prostate, status post intensity-modulated      radiation therapy.  2. Coronary artery disease status, post coronary stent placement.  3. Cecal soft tissue mass.   PLAN:  The patient will be followed in the office with a repeat PSA.  He  also has an appointment with a gastroenterologist for colonoscopy.  The  patient is scheduled for discharge today.  We will follow him up as an  outpatient.      Hanley Ben, M.D.  Electronically Signed     MN/MEDQ  D:  11/17/2006  T:  11/17/2006  Job:  [id number]

## 2010-07-25 NOTE — Discharge Summary (Signed)
NAMESUNAO, FAIRWEATHER NO.:  [id number]   MEDICAL RECORD NO.:  [id number]          PATIENT TYPE:  INP   LOCATION:  [id number]                         FACILITY:  [facility]   PHYSICIAN:  Belva Crome, M.D.   DATE OF BIRTH:  October 17, 1930   DATE OF ADMISSION:  11/14/2006  DATE OF DISCHARGE:  11/17/2006                               DISCHARGE SUMMARY   DISCHARGE DIAGNOSES:  1. ST-segment elevated myocardial infarction, status post percutaneous      coronary intervention utilizing a bare metal stent.  2. Anemia, microcytic, hypochromic, requiring transfusion.  3. History of prostate cancer, adenocarcinoma, status post intensity      modulated radiation therapy.  4. Hypertension.  5. Long-term medication use.   Mr. Kemna is a 77 year old male patient who comes in with a 4 to 6-week  history of exertional mid sternal chest discomfort while at church.  The  discomfort occurred at rest and it was associated with diaphoresis and  shortness of breath.  He presented to the emergency room and, in the  emergency room, there was questionable ST-segment elevation in the  inferior leads.  Therefore,  the patient was taken emergently to the  cardiac catheterization lab and found to have the following:  Left main  normal, LAD with irregularities, first diagonal 80%, circumflex with a  90% diffuse disease, right coronary artery occluded with collaterals,  left-to-right.  The patient then underwent bare metal stent placement to  the right coronary artery occluded area resulting in 0% post procedure  with TIMI III flow.  The patient does have residual circumflex disease  as noted above.   The patient was transfused during this hospitalization due to a  hemoglobin of 8.3.  We did consult the GI service and the patient had  been scheduled for colonoscopy in the past, but apparently never showed  up.  Indeed, the physician, Dr. Penelope Coop, felt the patient needed workup  for iron-deficiency anemia  and this was explained to the patient.  The  patient needs to follow up with Dr. Amedeo Plenty as an outpatient for the  colonoscopy and the phone number was written on the instruction sheet so  that the patient would call.   In addition, the patient has a history of prostate cancer and had some  hematuria, but during the hospitalization, Dr. Janice Norrie was consulted to  help [place] since the patient was going to be on blood thinners.  He had no  further recommendation and felt that the patient could be followed in  the office as an outpatient with a repeated PSA.   The patient's discharge instructions are as follows.  Clean the cath  site gently with soap and water.  No scrubbing.  No lifting over 10  pounds for 1 week.  No driving for 2 days.  Increase activity slowly.  Remain on a low-sodium heart-healthy diet.  The patient is to see Dr.  Janice Norrie next week and needed to call for this appointment.  He needs to see  Dr. Teena Irani as well to evaluate anemia and we have given the patient  his  phone number, and he is to call for this appointment.  The patient  has appointment with Dr. Daneen Schick on December 06, 2006, at 4 p.m.   DISCHARGE MEDICINES:  1. Norvasc 5 mg a day.  2. Darvocet p.r.n. pain.  3. Plavix 75 mg a day.  4. Baby aspirin 81 mg a day.  5. Lopressor 50 mg p.o. b.i.d.  6. Zocor 80 mg a day.  7. Sublingual nitroglycerin p.r.n. chest pain.   Please note the patient's lab studies are as follows.  Chest x-ray COPD.  EKG normal sinus rhythm, rate 52, with minimal ST-segment elevation in  the inferior leads and this was on admission.  By discharge, he had  inverted his T-waves in the inferior leads.  Sodium 139, potassium 4.2,  BUN 13, creatinine 1.28, maximum CK of 588, MB fraction of 50.4, maximum  troponin of 12.4, total cholesterol 136, triglycerides 93.  HDL 37, LDL  80.  Iron studies showed iron-deficiency anemia with a percent  saturation of 4 with an iron level at 12.  Ferritin level  at 7.   The patient is discharged to home in stable, but improved condition.      Joesphine Bare, P.A.      Belva Crome, M.D.  Electronically Signed    LB/MEDQ  D:  01/03/2007  T:  01/03/2007  Job:  [id number]   cc:   Belva Crome, M.D.  Hanley Ben, M.D.  John C. Amedeo Plenty, M.D.  Dr. Alyson Ingles

## 2010-12-12 LAB — DIFFERENTIAL
Basophils Absolute: 0 10*3/uL (ref 0.0–0.1)
Basophils Relative: 1 % (ref 0–1)
Lymphocytes Relative: 15 % (ref 12–46)
Monocytes Absolute: 0.4 10*3/uL (ref 0.1–1.0)
Monocytes Relative: 7 % (ref 3–12)
Neutro Abs: 2.6 10*3/uL (ref 1.7–7.7)
Neutrophils Relative %: 45 % (ref 43–77)

## 2010-12-12 LAB — BASIC METABOLIC PANEL
Calcium: 10.2 mg/dL (ref 8.4–10.5)
Creatinine, Ser: 1.34 mg/dL (ref 0.4–1.5)
GFR calc Af Amer: >60 mL/min (ref >60)
GFR calc non Af Amer: 52 mL/min — ABNORMAL LOW (ref >60)
Sodium: 140 mEq/L (ref 135–145)

## 2010-12-12 LAB — CBC
Hemoglobin: 13.6 g/dL (ref 13.0–17.0)
RBC: 4.61 MIL/uL (ref 4.22–5.81)

## 2010-12-15 LAB — BASIC METABOLIC PANEL
CO2: 27
Calcium: 9.2
Chloride: 98
Creatinine, Ser: 1.05
GFR calc Af Amer: >60
GFR calc Af Amer: >60
GFR calc non Af Amer: 56 — ABNORMAL LOW
Potassium: 4.2
Potassium: 4.8
Sodium: 132 — ABNORMAL LOW

## 2010-12-15 LAB — DIFFERENTIAL
Basophils Absolute: 0
Basophils Relative: 0
Eosinophils Absolute: 0.3
Eosinophils Relative: 2
Eosinophils Relative: 3
Lymphocytes Relative: 8 — ABNORMAL LOW
Lymphs Abs: 0.5 — ABNORMAL LOW
Lymphs Abs: 0.8
Monocytes Absolute: 0.6
Monocytes Absolute: 0.7
Monocytes Relative: 6
Neutro Abs: 8.6 — ABNORMAL HIGH
Neutrophils Relative %: 87 — ABNORMAL HIGH

## 2010-12-15 LAB — CBC
HCT: 30.8 — ABNORMAL LOW
HCT: 32.1 — ABNORMAL LOW
HCT: 33.5 — ABNORMAL LOW
Hemoglobin: 10.5 — ABNORMAL LOW
Hemoglobin: 9.8 — ABNORMAL LOW
MCHC: 31.3
MCV: 76.1 — ABNORMAL LOW
RBC: 4.12 — ABNORMAL LOW
RBC: 4.22
RDW: 21.5 — ABNORMAL HIGH
WBC: 10.3
WBC: 11.2 — ABNORMAL HIGH

## 2010-12-16 LAB — COMPREHENSIVE METABOLIC PANEL
ALT: 9
AST: 16
Albumin: 2.9 — ABNORMAL LOW
Chloride: 102
Creatinine, Ser: 1.37
GFR calc Af Amer: >60
Sodium: 136
Total Bilirubin: 0.5

## 2010-12-16 LAB — CBC
Hemoglobin: 7.3 — CL
Hemoglobin: 7.6 — CL
MCHC: 30.6
MCHC: 30.6
Platelets: 337
RBC: 3.26 — ABNORMAL LOW
RDW: 20.3 — ABNORMAL HIGH
WBC: 5.7

## 2010-12-16 LAB — DIFFERENTIAL
Basophils Absolute: 0
Eosinophils Absolute: 1.3 — ABNORMAL HIGH
Eosinophils Relative: 24 — ABNORMAL HIGH
Lymphocytes Relative: 13
Lymphs Abs: 0.7
Monocytes Absolute: 0.4

## 2010-12-16 LAB — BASIC METABOLIC PANEL
BUN: 18
CO2: 27
Calcium: 9.7
Creatinine, Ser: 1.33
GFR calc non Af Amer: 53 — ABNORMAL LOW
Glucose, Bld: 97

## 2010-12-16 LAB — CEA: CEA: 28.3 — ABNORMAL HIGH

## 2010-12-16 LAB — CROSSMATCH: Antibody Screen: NEGATIVE

## 2010-12-19 LAB — LIPID PANEL
Cholesterol: 136
HDL: 37 — ABNORMAL LOW
Triglycerides: 93

## 2010-12-19 LAB — CBC
HCT: 23.3 — ABNORMAL LOW
HCT: 26 — ABNORMAL LOW
HCT: 33.7 — ABNORMAL LOW
HCT: 37.1 — ABNORMAL LOW
Hemoglobin: 10.8 — ABNORMAL LOW
Hemoglobin: 7.2 — CL
MCHC: 30.2
MCHC: 32
MCHC: 32.6
MCV: 70 — ABNORMAL LOW
MCV: 73.3 — ABNORMAL LOW
MCV: 75.8 — ABNORMAL LOW
MCV: 76.5 — ABNORMAL LOW
Platelets: 191
Platelets: 233
RBC: 3.33 — ABNORMAL LOW
RBC: 3.55 — ABNORMAL LOW
RBC: 4.4
RBC: 4.89
RDW: 18.9 — ABNORMAL HIGH
RDW: 21.6 — ABNORMAL HIGH
WBC: 5.3
WBC: 5.5
WBC: 6.5

## 2010-12-19 LAB — POCT CARDIAC MARKERS
Myoglobin, poc: 146
Operator id: 265201
Troponin i, poc: 2.35

## 2010-12-19 LAB — I-STAT 8, (EC8 V) (CONVERTED LAB)
Glucose, Bld: 128 — ABNORMAL HIGH
Potassium: 4.9
TCO2: 25
pH, Ven: 7.395 — ABNORMAL HIGH

## 2010-12-19 LAB — IRON AND TIBC
Iron: 12 — ABNORMAL LOW
Saturation Ratios: 4 — ABNORMAL LOW
TIBC: 307

## 2010-12-19 LAB — POCT I-STAT CREATININE: Operator id: 265201

## 2010-12-19 LAB — CROSSMATCH

## 2010-12-19 LAB — BASIC METABOLIC PANEL
BUN: 17
CO2: 23
CO2: 26
CO2: 27
Chloride: 102
Chloride: 102
Chloride: 103
Creatinine, Ser: 1.29
Creatinine, Ser: 1.3
GFR calc Af Amer: >60
GFR calc Af Amer: >60
Glucose, Bld: 81
Glucose, Bld: 87
Potassium: 4.2
Potassium: 4.3
Potassium: 4.4
Sodium: 139

## 2010-12-19 LAB — ABO/RH: ABO/RH(D): A POS

## 2010-12-19 LAB — CARDIAC PANEL(CRET KIN+CKTOT+MB+TROPI)
CK, MB: 50.4 — ABNORMAL HIGH
CK, MB: 8.1 — ABNORMAL HIGH
Relative Index: 8.6 — ABNORMAL HIGH
Relative Index: 9.2 — ABNORMAL HIGH
Total CK: 259 — ABNORMAL HIGH
Troponin I: 4.72

## 2010-12-19 LAB — PROTIME-INR: Prothrombin Time: 12.9

## 2010-12-19 LAB — PREPARE RBC (CROSSMATCH)

## 2010-12-19 LAB — VITAMIN B12: Vitamin B-12: 471 (ref 211–911)

## 2012-04-09 ENCOUNTER — Encounter (HOSPITAL_COMMUNITY): Payer: Self-pay | Admitting: Radiology

## 2012-04-09 ENCOUNTER — Emergency Department (HOSPITAL_COMMUNITY)
Admission: EM | Admit: 2012-04-09 | Discharge: 2012-04-09 | Disposition: A | Discharge status: Home / Self Care | Payer: PRIVATE HEALTH INSURANCE | Attending: Emergency Medicine | Admitting: Emergency Medicine

## 2012-04-09 ENCOUNTER — Emergency Department (HOSPITAL_COMMUNITY): Payer: PRIVATE HEALTH INSURANCE

## 2012-04-09 DIAGNOSIS — S02640A Fracture of ramus of mandible, unspecified side, initial encounter for closed fracture: Secondary | ICD-10-CM | POA: Insufficient documentation

## 2012-04-09 DIAGNOSIS — Y9301 Activity, walking, marching and hiking: Secondary | ICD-10-CM | POA: Insufficient documentation

## 2012-04-09 DIAGNOSIS — I10 Essential (primary) hypertension: Secondary | ICD-10-CM | POA: Insufficient documentation

## 2012-04-09 DIAGNOSIS — Y929 Unspecified place or not applicable: Secondary | ICD-10-CM | POA: Insufficient documentation

## 2012-04-09 DIAGNOSIS — W010XXA Fall on same level from slipping, tripping and stumbling without subsequent striking against object, initial encounter: Secondary | ICD-10-CM | POA: Insufficient documentation

## 2012-04-09 DIAGNOSIS — S32519A Fracture of superior rim of unspecified pubis, initial encounter for closed fracture: Secondary | ICD-10-CM

## 2012-04-09 MED ORDER — IBUPROFEN 600 MG PO TABS: 600.0000 mg | ORAL_TABLET | Freq: Four times a day (QID) | ORAL | Status: DC | PRN

## 2012-04-09 NOTE — ED Notes (Signed)
Pt undressed, in gown, on continuous pulse oximetry and blood pressure cuff; family at bedside 

## 2012-04-09 NOTE — ED Provider Notes (Signed)
History     CSN: [id number]  Arrival date & time 04/09/12  [id number]   First MD Initiated Contact with Patient 04/09/12 1016      Chief Complaint  Patient presents with  . Fall    (Consider location/radiation/quality/duration/timing/severity/associated sxs/prior treatment) HPI  Pt with PMH of hypertension and hyperlipidemia presents to the ED after a fall that occurred yesterday. He uses a cane intermittently at baseline. He slipped on something yesterday and fell towards his left side. Denies hitting his head or injuring his neck. He admits that since the accident he has not been able to put any weight on his left leg. He used a cane to ambulate to the ED but he needed a significant amount of support. His pain is worse with abduction and weight bearing. Pain resolves with rest. He lives at home with family members. nad vss  Past Medical History  Diagnosis Date  . Hypertension     History reviewed. No pertinent past surgical history.  History reviewed. No pertinent family history.  History  Substance Use Topics  . Smoking status: Not on file  . Smokeless tobacco: Not on file  . Alcohol Use: Not on file      Review of Systems  All other systems reviewed and are negative.    Allergies  Review of patient's allergies indicates no known allergies.  Home Medications   Current Outpatient Rx  Name  Route  Sig  Dispense  Refill  . AMLODIPINE BESYLATE 10 MG PO TABS   Oral   Take 10 mg by mouth daily.         . ATORVASTATIN CALCIUM 40 MG PO TABS   Oral   Take 40 mg by mouth daily.         Marland Kitchen HYDROCODONE-ACETAMINOPHEN 5-325 MG PO TABS   Oral   Take 1 tablet by mouth once.         . IRON PO   Oral   Take 1 tablet by mouth at bedtime.         Marland Kitchen LISINOPRIL 40 MG PO TABS   Oral   Take 40 mg by mouth daily.         Marland Kitchen METOPROLOL TARTRATE 25 MG PO TABS   Oral   Take 12.5 mg by mouth 2 (two) times daily.         Marland Kitchen NITROGLYCERIN 0.4 MG SL SUBL   Sublingual  Place 0.4 mg under the tongue every 5 (five) minutes as needed. For chest pain           BP 153/64  Pulse 87  Temp 98.1 F (36.7 C) (Oral)  Resp 18  SpO2 100%  Physical Exam  Nursing note and vitals reviewed. Constitutional: He appears well-developed and well-nourished. No distress.  HENT:  Head: Normocephalic and atraumatic.  Eyes: Pupils are equal, round, and reactive to light.  Neck: Normal range of motion. Neck supple.  Cardiovascular: Normal rate and regular rhythm.   Pulmonary/Chest: Effort normal.  Abdominal: Soft.  Musculoskeletal:       Left hip: He exhibits decreased range of motion, decreased strength and tenderness. He exhibits no bony tenderness, no swelling, no crepitus, no deformity and no laceration.       Legs: Neurological: He is alert.  Skin: Skin is warm and dry.    ED Course  Procedures (including critical care time)  Labs Reviewed - No data to display Dg Hip Complete Left  04/09/2012  *RADIOLOGY REPORT*  Clinical Data: Post  fall, now with left-sided hip pain  LEFT HIP - COMPLETE 2+ VIEW  Comparison: CT abdomen pelvis - 02/27/2011  Findings:  There is a minimally displaced fracture of the left superior pubic ramus.  Query nondisplaced fracture of the left inferior pubic ramus.  No definite hip fracture.  The left hip joint spaces are preserved.  Normal appearance of the pubic symphysis.  Multiple phleboliths overlie the lower pelvis.  Vascular calcifications.  Regional soft tissues otherwise normal.  No radiopaque foreign body.  IMPRESSION:  Minimally displaced fracture of the left superior pubic ramus with likely nondisplaced fracture of the left inferior pubic ramus.   Original Report Authenticated By: Jake Seats, MD      1. Fracture of superior pubic ramus       MDM  Pt has fracture of the left superior pubic rami. I recommended admission because of concern for weight bearing but he has a wheel chair at home, a walker and assistance if he needs  it. He really wants to go home. We got a walker and ambulated him in the hallway without any difficulty. Will let patient go home. Family members advised to bring him back to the ED if they have any concerns. They agreed to check on him frequently.  Pt has been advised of the symptoms that warrant their return to the ED. Patient has voiced understanding and has agreed to follow-up with the PCP or specialist.        Linus Mako, [place] 04/09/12 1311

## 2012-04-09 NOTE — ED Notes (Signed)
Ambulated with walker in hallway without difficulty-

## 2012-04-09 NOTE — ED Notes (Signed)
Patient transported to X-ray 

## 2012-04-09 NOTE — ED Notes (Signed)
Pt presents with right leg extremity pain r.t fall from standing yesterday. Pt states that he was walking and slide onto the pavement.

## 2012-04-09 NOTE — ED Notes (Signed)
Pt returned from xray

## 2012-04-10 NOTE — ED Provider Notes (Signed)
Medical screening examination/treatment/procedure(s) were performed by non-physician practitioner and as supervising physician I was immediately available for consultation/collaboration.   Mirna Mires, MD 04/10/12 1447

## 2012-08-08 ENCOUNTER — Other Ambulatory Visit (HOSPITAL_COMMUNITY): Payer: Self-pay | Admitting: Urology

## 2012-08-08 DIAGNOSIS — C61 Malignant neoplasm of prostate: Secondary | ICD-10-CM

## 2012-08-15 ENCOUNTER — Encounter (HOSPITAL_COMMUNITY)
Admission: RE | Admit: 2012-08-15 | Discharge: 2012-08-15 | Disposition: A | Discharge status: Home / Self Care | Payer: PRIVATE HEALTH INSURANCE | Source: Ambulatory Visit | Attending: Urology | Admitting: Urology

## 2012-08-15 DIAGNOSIS — C61 Malignant neoplasm of prostate: Secondary | ICD-10-CM | POA: Insufficient documentation

## 2012-08-15 MED ORDER — TECHNETIUM TC 99M MEDRONATE IV KIT
25.0000 | PACK | Freq: Once | INTRAVENOUS | Status: AC | PRN
  Administered 2012-08-15: 25 via INTRAVENOUS

## 2012-12-26 ENCOUNTER — Other Ambulatory Visit: Payer: Self-pay | Admitting: *Deleted

## 2012-12-26 DIAGNOSIS — Z79899 Other long term (current) drug therapy: Secondary | ICD-10-CM

## 2012-12-26 DIAGNOSIS — E782 Mixed hyperlipidemia: Secondary | ICD-10-CM

## 2013-01-25 ENCOUNTER — Encounter: Payer: Self-pay | Admitting: Interventional Cardiology

## 2013-01-25 ENCOUNTER — Ambulatory Visit (INDEPENDENT_AMBULATORY_CARE_PROVIDER_SITE_OTHER): Payer: PRIVATE HEALTH INSURANCE | Admitting: Interventional Cardiology

## 2013-01-25 VITALS — BP 150/60 | HR 55 | Ht 68.0 in | Wt 165.0 lb

## 2013-01-25 DIAGNOSIS — I25810 Atherosclerosis of coronary artery bypass graft(s) without angina pectoris: Secondary | ICD-10-CM

## 2013-01-25 DIAGNOSIS — C180 Malignant neoplasm of cecum: Secondary | ICD-10-CM

## 2013-01-25 DIAGNOSIS — E782 Mixed hyperlipidemia: Secondary | ICD-10-CM | POA: Insufficient documentation

## 2013-01-25 DIAGNOSIS — I2510 Atherosclerotic heart disease of native coronary artery without angina pectoris: Secondary | ICD-10-CM | POA: Insufficient documentation

## 2013-01-25 DIAGNOSIS — I10 Essential (primary) hypertension: Secondary | ICD-10-CM | POA: Insufficient documentation

## 2013-01-25 DIAGNOSIS — E785 Hyperlipidemia, unspecified: Secondary | ICD-10-CM | POA: Insufficient documentation

## 2013-01-25 NOTE — Progress Notes (Signed)
Patient ID: [name], male   DOB: [birth year], 79 y.o.   MRN: [id number]    [address], [address], [place]  [address] Phone: [phone] Fax:  [phone]  Date:  01/25/2013   ID:  [name], DOB Jul 13, 1930, MRN [id number]  PCP:  No primary provider on file.   ASSESSMENT:  1. Coronary artery disease with prior history of right coronary stent, stable without angina 2. Hyperlipidemia on statin therapy (see the atorvastatin or simvastatin) 3. Muscle aches and pains possibly related to statin therapy 4. Hypertension, controlled  PLAN:  1. Discontinue statin therapy for at least 4 weeks. He should then give [place] a call and let us know if the muscle aches resolved. Management of lipids will be based upon response to statin free interval. 2. Maintain an active lifestyle 3. Decrease fat and salt in diet   SUBJECTIVE: [name] is a 79 y.o. male who is experiencing bilateral arm and leg discomfort/soreness. He's had no chest discomfort. He denies dyspnea and edema. No palpitations or syncope.   Wt Readings from Last 3 Encounters:  01/25/13 165 lb (74.844 kg)     Past Medical History  Diagnosis Date  . Hypertension     Current Outpatient Prescriptions  Medication Sig Dispense Refill  . amLODipine (NORVASC) 10 MG tablet Take 10 mg by mouth daily.      Marland Kitchen atorvastatin (LIPITOR) 40 MG tablet Take 40 mg by mouth daily.      Marland Kitchen HYDROcodone-acetaminophen (NORCO/VICODIN) 5-325 MG per tablet Take 1 tablet by mouth once.      Marland Kitchen ibuprofen (ADVIL,MOTRIN) 600 MG tablet Take 1 tablet (600 mg total) by mouth every 6 (six) hours as needed for pain.  30 tablet  0  . IRON PO Take 1 tablet by mouth at bedtime.      Marland Kitchen lisinopril (PRINIVIL,ZESTRIL) 40 MG tablet Take 40 mg by mouth daily.      . metoprolol tartrate (LOPRESSOR) 25 MG tablet Take 12.5 mg by mouth 2 (two) times daily.      . nitroGLYCERIN (NITROSTAT) 0.4 MG SL tablet Place 0.4 mg under the tongue every 5 (five)  minutes as needed. For chest pain       No current facility-administered medications for this visit.    Allergies:   No Known Allergies  Social History:  The patient  reports that he has been smoking.  He does not have any smokeless tobacco history on file. He reports that he does not drink alcohol or use illicit drugs.   ROS:  Please see the history of present illness.   Has difficulty arising from seats. Movement of arm and leg causes soreness. He denies angina. He has not needed nitroglycerin. No issues with his colon cancer. No blood in stool or urine.   All other systems reviewed and negative.   OBJECTIVE: VS:  BP 150/60  Pulse 55  Ht 5\' 8"  (1.727 m)  Wt 165 lb (74.844 kg)  BMI 25.09 kg/m2 Well nourished, well developed, in no acute distress, other than stated age 75: normal Neck: JVD flat. Carotid bruit 2+  Cardiac:  normal S1, S2; RRR; no murmur Lungs:  clear to auscultation bilaterally, no wheezing, rhonchi or rales Abd: soft, nontender, no hepatomegaly Ext: Edema  Absent . Pulses 2+  Skin: warm and dry Neuro:  CNs 2-12 intact, no focal abnormalities noted  EKG:  Normal sinus rhythm with flat T waves. No acute ST-T wave change  Signed, Illene Labrador III, MD 01/25/2013 10:20 AM

## 2013-01-25 NOTE — Patient Instructions (Signed)
Do NOT take atorvastatin/simvastatin( cholesterol medication) for 1 month. Call us with an update of how you are feeling. [phone]  Take all other medications as prescribed.  Your physician wants you to follow-up in: 1 year You will receive a reminder letter in the mail two months in advance. If you don't receive a letter, please call our office to schedule the follow-up appointment.

## 2013-05-30 ENCOUNTER — Encounter (HOSPITAL_COMMUNITY): Payer: Self-pay | Admitting: Emergency Medicine

## 2013-05-30 ENCOUNTER — Emergency Department (HOSPITAL_COMMUNITY)
Admission: EM | Admit: 2013-05-30 | Discharge: 2013-05-30 | Disposition: A | Discharge status: Home / Self Care | Payer: PRIVATE HEALTH INSURANCE | Attending: Emergency Medicine | Admitting: Emergency Medicine

## 2013-05-30 DIAGNOSIS — I10 Essential (primary) hypertension: Secondary | ICD-10-CM | POA: Insufficient documentation

## 2013-05-30 DIAGNOSIS — Z79899 Other long term (current) drug therapy: Secondary | ICD-10-CM | POA: Diagnosis not present

## 2013-05-30 DIAGNOSIS — I2510 Atherosclerotic heart disease of native coronary artery without angina pectoris: Secondary | ICD-10-CM | POA: Diagnosis not present

## 2013-05-30 DIAGNOSIS — M79609 Pain in unspecified limb: Secondary | ICD-10-CM | POA: Insufficient documentation

## 2013-05-30 DIAGNOSIS — M7989 Other specified soft tissue disorders: Secondary | ICD-10-CM | POA: Diagnosis not present

## 2013-05-30 DIAGNOSIS — F17200 Nicotine dependence, unspecified, uncomplicated: Secondary | ICD-10-CM | POA: Insufficient documentation

## 2013-05-30 DIAGNOSIS — IMO0001 Reserved for inherently not codable concepts without codable children: Secondary | ICD-10-CM | POA: Diagnosis not present

## 2013-05-30 DIAGNOSIS — M79604 Pain in right leg: Secondary | ICD-10-CM

## 2013-05-30 LAB — CBC WITH DIFFERENTIAL/PLATELET
BASOS PCT: 1 % (ref 0–1)
Basophils Absolute: 0.1 10*3/uL (ref 0.0–0.1)
EOS PCT: 22 % — AB (ref 0–5)
Eosinophils Absolute: 1.1 10*3/uL — ABNORMAL HIGH (ref 0.0–0.7)
HEMATOCRIT: 34.8 % — AB (ref 39.0–52.0)
HEMOGLOBIN: 11.5 g/dL — AB (ref 13.0–17.0)
LYMPHS ABS: 1 10*3/uL (ref 0.7–4.0)
Lymphocytes Relative: 19 % (ref 12–46)
MCH: 28.4 pg (ref 26.0–34.0)
MCHC: 33 g/dL (ref 30.0–36.0)
MCV: 85.9 fL (ref 78.0–100.0)
MONO ABS: 0.4 10*3/uL (ref 0.1–1.0)
Monocytes Relative: 8 % (ref 3–12)
NEUTROS PCT: 50 % (ref 43–77)
Neutro Abs: 2.5 10*3/uL (ref 1.7–7.7)
Platelets: 146 10*3/uL — ABNORMAL LOW (ref 150–400)
RBC: 4.05 MIL/uL — AB (ref 4.22–5.81)
RDW: 14.5 % (ref 11.5–15.5)
WBC: 5 10*3/uL (ref 4.0–10.5)

## 2013-05-30 LAB — BASIC METABOLIC PANEL
BUN: 18 mg/dL (ref 6–23)
CALCIUM: 10.5 mg/dL (ref 8.4–10.5)
CO2: 27 mEq/L (ref 19–32)
CREATININE: 1.18 mg/dL (ref 0.50–1.35)
Chloride: 101 mEq/L (ref 96–112)
GFR, EST AFRICAN AMERICAN: 65 mL/min — AB (ref >90)
GFR, EST NON AFRICAN AMERICAN: 56 mL/min — AB (ref >90)
Glucose, Bld: 84 mg/dL (ref 70–99)
POTASSIUM: 4.7 meq/L (ref 3.7–5.3)
Sodium: 138 mEq/L (ref 137–147)

## 2013-05-30 LAB — APTT: APTT: 25 s (ref 24–37)

## 2013-05-30 LAB — PROTIME-INR
INR: 1.03 (ref 0.00–1.49)
Prothrombin Time: 13.3 seconds (ref 11.6–15.2)

## 2013-05-30 MED ORDER — ONDANSETRON HCL 4 MG/2ML IJ SOLN: 4.0000 mg | Freq: Once | INTRAMUSCULAR | Status: DC

## 2013-05-30 MED ORDER — MORPHINE SULFATE 4 MG/ML IJ SOLN: 2.0000 mg | Freq: Once | INTRAMUSCULAR | Status: DC

## 2013-05-30 MED ORDER — TRAMADOL HCL 50 MG PO TABS
50.0000 mg | ORAL_TABLET | Freq: Once | ORAL | Status: AC
  Administered 2013-05-30: 50 mg via ORAL
  Filled 2013-05-30: qty 1

## 2013-05-30 MED ORDER — TRAMADOL HCL 50 MG PO TABS: 50.0000 mg | ORAL_TABLET | Freq: Four times a day (QID) | ORAL | Status: DC | PRN

## 2013-05-30 NOTE — ED Notes (Signed)
Pt states he does not want pain medication at this time.

## 2013-05-30 NOTE — ED Provider Notes (Signed)
CSN: [id number]     Arrival date & time 05/30/13  1246 History   First MD Initiated Contact with Patient 05/30/13 1310     Chief Complaint  Patient presents with  . Leg Pain     (Consider location/radiation/quality/duration/timing/severity/associated sxs/prior Treatment) HPI Comments: Patient is an 80 year old male with a past medical history of hypertension and CAD who presents with right leg pain for the past 3 days. Symptoms started gradually and progressively worsened since the onset. The pain is aching and severe without radiation. He reports associated leg swelling. Palpation of the right calf makes the pain worse. No alleviating factors. Patient denies any injury. No other associated symptoms. Patient denies recent travel, surgery, or immobilization.    Past Medical History  Diagnosis Date  . Hypertension    History reviewed. No pertinent past surgical history. Family History  Problem Relation Age of Onset  . Stroke Mother    History  Substance Use Topics  . Smoking status: Current Some Day Smoker  . Smokeless tobacco: Not on file  . Alcohol Use: No    Review of Systems  Constitutional: Negative for fever, chills and fatigue.  HENT: Negative for trouble swallowing.   Eyes: Negative for visual disturbance.  Respiratory: Negative for shortness of breath.   Cardiovascular: Positive for leg swelling. Negative for chest pain and palpitations.  Gastrointestinal: Negative for nausea, vomiting, abdominal pain and diarrhea.  Genitourinary: Negative for dysuria and difficulty urinating.  Musculoskeletal: Positive for myalgias. Negative for arthralgias and neck pain.  Skin: Negative for color change.  Neurological: Negative for dizziness and weakness.  Psychiatric/Behavioral: Negative for dysphoric mood.      Allergies  Review of patient's allergies indicates no known allergies.  Home Medications   Current Outpatient Rx  Name  Route  Sig  Dispense  Refill  . amLODipine  (NORVASC) 10 MG tablet   Oral   Take 10 mg by mouth daily.         Marland Kitchen atorvastatin (LIPITOR) 40 MG tablet   Oral   Take 40 mg by mouth daily.         . IRON PO   Oral   Take 1 tablet by mouth at bedtime.         Marland Kitchen lisinopril (PRINIVIL,ZESTRIL) 40 MG tablet   Oral   Take 40 mg by mouth daily.         . metoprolol tartrate (LOPRESSOR) 25 MG tablet   Oral   Take 12.5 mg by mouth 2 (two) times daily.         . nitroGLYCERIN (NITROSTAT) 0.4 MG SL tablet   Sublingual   Place 0.4 mg under the tongue every 5 (five) minutes as needed. For chest pain          BP 194/103  Pulse 81  Temp(Src) 98.9 F (37.2 C) (Oral)  Resp 20  Ht 5\' 8"  (1.727 m)  Wt 144 lb (65.318 kg)  BMI 21.90 kg/m2  SpO2 99% Physical Exam  Nursing note and vitals reviewed. Constitutional: He is oriented to person, place, and time. He appears well-developed and well-nourished. No distress.  HENT:  Head: Normocephalic and atraumatic.  Eyes: Conjunctivae and EOM are normal.  Neck: Normal range of motion.  Cardiovascular: Normal rate, regular rhythm and intact distal pulses.  Exam reveals no gallop and no friction rub.   No murmur heard. Pulmonary/Chest: Effort normal and breath sounds normal. He has no wheezes. He has no rales. He exhibits no tenderness.  Musculoskeletal: Normal range of motion.  Right lower leg edema and calf tenderness to palpation. No calf tenderness or edema on the left.   Neurological: He is alert and oriented to person, place, and time. Coordination abnormal.  Speech is goal-oriented. Moves limbs without ataxia.   Skin: Skin is warm and dry.  Psychiatric: He has a normal mood and affect. His behavior is normal.    ED Course  Procedures (including critical care time) Labs Review Labs Reviewed  CBC WITH DIFFERENTIAL - Abnormal; Notable for the following:    RBC 4.05 (*)    Hemoglobin 11.5 (*)    HCT 34.8 (*)    Platelets 146 (*)    Eosinophils Relative 22 (*)     Eosinophils Absolute 1.1 (*)    All other components within normal limits  BASIC METABOLIC PANEL - Abnormal; Notable for the following:    GFR calc non Af Amer 56 (*)    GFR calc Af Amer 65 (*)    All other components within normal limits  PROTIME-INR  APTT   Imaging Review No results found.   EKG Interpretation None      MDM   Final diagnoses:  Right leg pain    2:07 PM Labs pending. Patient will have DVT study of right lower extremity to rule out DVT. Vitals stable and patient afebrile. Patient denies chest pain or SOB.   Patient signed out to Noland Fordyce pending DVT study and labs. If patient has a DVT, he will be treated with lovenox injection and be discharged with anticoagulation.     Alvina Chou, [place] 05/31/13 [id number]

## 2013-05-30 NOTE — Progress Notes (Signed)
VASCULAR LAB PRELIMINARY  PRELIMINARY  PRELIMINARY  PRELIMINARY  Right lower extremity venous duplex  completed.    Preliminary report: Right leg is negative for deep and superficial vein thrombosis.  There is an ill defined structure in the mid to distal medial calf.  Arterial and venous flow in noted in and around this structure.  Uncertain of etiology   Latesa Fratto, RVT 05/30/2013, 3:14 PM

## 2013-05-30 NOTE — ED Provider Notes (Signed)
Pt signed out to me by Alvina Chou PA-C at shift change.  Pt is an 80yo male presenting with right lower leg pain x3 days. Denies injury to leg, ambulatory in triage with walker. On exam, pt has right lower leg edema and calf tenderness to palpation. No calf tenderness or edema on the left.    3:43 PM Labs pending. Patient will have DVT study of right lower extremity to rule out DVT.   Pt is not SOB or c/o chest pain.  Plan is to discharge pt home with appropriate tx pending DVT workup.   U/S right leg: Preliminary report: Right leg is negative for deep and superficial vein thrombosis. There is an ill defined structure in the mid to distal medial calf. Arterial and venous flow in noted in and around this structure. Uncertain of etiology. Labs: unremarkable.   Discussed findings with Dr. Audie Pinto. No further imaging or workup needed at this time. Pt may be discharged home. Discussed findings with pt. Will have him f/u with his PCP later this week for recheck of symptoms. Will tx symptomatically for pain. Rx: tramadol. Pt and family member verbalized understanding and agreement with tx plan.               Noland Fordyce, PA-C 05/30/13 1725

## 2013-05-30 NOTE — ED Notes (Addendum)
Pt reports right lower leg pain x 3 days. Denies injury to leg, pt is ambulatory at triage with walker. Pt is hypertensive at triage, states he has not taken his meds today.

## 2013-05-30 NOTE — ED Notes (Signed)
Pt returned from doppler study, attempted to obtain labs x2, second person to bedside to obtain labs

## 2013-06-03 NOTE — ED Provider Notes (Signed)
Medical screening examination/treatment/procedure(s) were performed by non-physician practitioner and as supervising physician I was immediately available for consultation/collaboration.   EKG Interpretation None        Tanna Furry, MD 06/03/13 1050

## 2014-01-29 ENCOUNTER — Ambulatory Visit (INDEPENDENT_AMBULATORY_CARE_PROVIDER_SITE_OTHER): Payer: PRIVATE HEALTH INSURANCE | Admitting: Interventional Cardiology

## 2014-01-29 ENCOUNTER — Encounter: Payer: Self-pay | Admitting: Interventional Cardiology

## 2014-01-29 ENCOUNTER — Other Ambulatory Visit: Payer: Self-pay

## 2014-01-29 VITALS — BP 148/100 | HR 73 | Ht 68.0 in | Wt 139.0 lb

## 2014-01-29 DIAGNOSIS — E785 Hyperlipidemia, unspecified: Secondary | ICD-10-CM

## 2014-01-29 DIAGNOSIS — I25810 Atherosclerosis of coronary artery bypass graft(s) without angina pectoris: Secondary | ICD-10-CM

## 2014-01-29 DIAGNOSIS — I10 Essential (primary) hypertension: Secondary | ICD-10-CM

## 2014-01-29 MED ORDER — NITROGLYCERIN 0.4 MG SL SUBL: 0.4000 mg | SUBLINGUAL_TABLET | SUBLINGUAL | Status: DC | PRN

## 2014-01-29 MED ORDER — ISOSORBIDE MONONITRATE ER 60 MG PO TB24: 60.0000 mg | ORAL_TABLET | Freq: Every day | ORAL | Status: DC

## 2014-01-29 MED ORDER — ATORVASTATIN CALCIUM 40 MG PO TABS: 40.0000 mg | ORAL_TABLET | Freq: Every day | ORAL | Status: DC

## 2014-01-29 MED ORDER — METOPROLOL TARTRATE 25 MG PO TABS: 12.5000 mg | ORAL_TABLET | Freq: Two times a day (BID) | ORAL | Status: DC

## 2014-01-29 NOTE — Progress Notes (Signed)
Patient [name]: [name], male   DOB: 05/15/1930, 80 y.o.   MRN: [id number]    [address], [place]  [address] Phone: [phone] Fax:  [phone]  Date:  01/29/2014   [name]:  [name], DOB 08/12/1930, MRN [id number]  PCP:  [name], MD   ASSESSMENT:  1. Coronary artery disease with prior mL stent in RCA and residual 80% obtuse marginal. Progressive angina over the past 2 months. Multiple sublingual nitroglycerin tablets to be in use. There is exertional dyspnea. 2. Hypertension, essential, stable 3. Hyperlipidemia, on statin therapy 4. ECG with new lateral T wave abnormality in lead 1, aVL, and V5 and 6.  PLAN:  1. Imdur 60 mg daily 2. Nitroglycerin use for episodes of chest discomfort did not resolve with rest 3. Coronary angiography to define anatomy and help guide therapy in this patient with known prior bare-metal stent in the right coronary and significant stenosis in the first obtuse marginal 8 years ago 4. The risk of the procedure including stroke, death, myocardial infarction, kidney injury, limb ischemia, among others were discussed in detail with the patient in the presence of his granddaughter. The patient is uncertain about whether to proceed but will let us know. I will plan to see him back in 30 days no matter the final decision concerning the cath.   SUBJECTIVE: [name] is a 80 y.o. male who is noticing exertional dyspnea and angina with walking. He uses a walker. He is independent. He had his wife live independently. He is accompanied by his granddaughter today. He states that for the last 2 months he has had more dyspnea than usual and a feeling of tightness in his chest with activity. He developed chest discomfort with getting out of the car coming into the building today. He is not having discomfort at this time. He denies transient neurological complaints. He has difficulty with balance and uses a walker. He denies orthopnea,  edema, PND, and palpitations.   Wt Readings from Last 3 Encounters:  01/29/14 139 lb (63.05 kg)  05/30/13 144 lb (65.318 kg)  01/25/13 165 lb (74.844 kg)     Past Medical History  Diagnosis Date  . Hypertension     Current Outpatient Prescriptions  Medication Sig Dispense Refill  . amLODipine (NORVASC) 10 MG tablet Take 10 mg by mouth daily.    Marland Kitchen atorvastatin (LIPITOR) 40 MG tablet Take 40 mg by mouth daily.    . IRON PO Take 1 tablet by mouth at bedtime.    Marland Kitchen lisinopril (PRINIVIL,ZESTRIL) 40 MG tablet Take 40 mg by mouth daily.    . metoprolol tartrate (LOPRESSOR) 25 MG tablet Take 12.5 mg by mouth 2 (two) times daily.    . nitroGLYCERIN (NITROSTAT) 0.4 MG SL tablet Place 0.4 mg under the tongue every 5 (five) minutes as needed. For chest pain    . traMADol (ULTRAM) 50 MG tablet Take 1 tablet (50 mg total) by mouth every 6 (six) hours as needed. 15 tablet 0   No current facility-administered medications for this visit.    Allergies:   No Known Allergies  Social History:  The patient  reports that he has been smoking.  He does not have any smokeless tobacco history on file. He reports that he does not drink alcohol or use illicit drugs.   ROS:  Please see the history of present illness.   Denies palpitations, nausea, vomiting, transient neurological complaints, recent falls, blood in the urine or  stool.   All other systems reviewed and negative.   OBJECTIVE: VS:  BP 148/100 mmHg  Pulse 73  Ht 5\' 8"  (1.727 m)  Wt 139 lb (63.05 kg)  BMI 21.14 kg/m2 Well nourished, well developed, in no acute distress, elderly black gentleman in no distress who has atrophy of his right hand HEENT: normal Neck: JVD flat. Carotid bruit absent  Cardiac:  normal S1, S2; RRR; no murmur Lungs:  clear to auscultation bilaterally, no wheezing, rhonchi or rales Abd: soft, nontender, no hepatomegaly Ext: Edema absent. Pulses 2+ Skin: warm and dry Neuro:  CNs 2-12 intact, no focal abnormalities  noted  EKG:  Normal sinus rhythm with lateral T-wave inversion including V4 through V6.. T-wave abnormality in 1 and aVL is new compared to prior from 2014    Signed, [place], MD 01/29/2014 12:14 PM

## 2014-01-29 NOTE — Patient Instructions (Signed)
Your physician has recommended you make the following change in your medication:  1) START Isosorbide 60mg  daily. An Rx has been sent to your pharmacy  Call the office when you decide to proceed with your Cardiac Catheterization.[phone] ( ask for Lattie Haw)  You have a follow up appointment on 02/21/14 @ 9am

## 2014-02-09 ENCOUNTER — Emergency Department (HOSPITAL_COMMUNITY): Payer: PRIVATE HEALTH INSURANCE

## 2014-02-09 ENCOUNTER — Encounter (HOSPITAL_COMMUNITY): Payer: Self-pay | Admitting: Emergency Medicine

## 2014-02-09 ENCOUNTER — Inpatient Hospital Stay (HOSPITAL_COMMUNITY)
Admission: EM | Admit: 2014-02-09 | Discharge: 2014-02-13 | DRG: 281 | Disposition: A | Discharge status: Home / Self Care | Payer: PRIVATE HEALTH INSURANCE | Attending: Cardiology | Admitting: Cardiology

## 2014-02-09 ENCOUNTER — Other Ambulatory Visit: Payer: Self-pay

## 2014-02-09 DIAGNOSIS — J449 Chronic obstructive pulmonary disease, unspecified: Secondary | ICD-10-CM | POA: Diagnosis present

## 2014-02-09 DIAGNOSIS — N183 Chronic kidney disease, stage 3 (moderate): Secondary | ICD-10-CM | POA: Diagnosis present

## 2014-02-09 DIAGNOSIS — N179 Acute kidney failure, unspecified: Secondary | ICD-10-CM | POA: Diagnosis present

## 2014-02-09 DIAGNOSIS — R0682 Tachypnea, not elsewhere classified: Secondary | ICD-10-CM | POA: Diagnosis present

## 2014-02-09 DIAGNOSIS — Z955 Presence of coronary angioplasty implant and graft: Secondary | ICD-10-CM

## 2014-02-09 DIAGNOSIS — J209 Acute bronchitis, unspecified: Secondary | ICD-10-CM | POA: Diagnosis not present

## 2014-02-09 DIAGNOSIS — I161 Hypertensive emergency: Secondary | ICD-10-CM | POA: Insufficient documentation

## 2014-02-09 DIAGNOSIS — Z8546 Personal history of malignant neoplasm of prostate: Secondary | ICD-10-CM

## 2014-02-09 DIAGNOSIS — I248 Other forms of acute ischemic heart disease: Secondary | ICD-10-CM | POA: Diagnosis present

## 2014-02-09 DIAGNOSIS — J440 Chronic obstructive pulmonary disease with acute lower respiratory infection: Secondary | ICD-10-CM | POA: Diagnosis not present

## 2014-02-09 DIAGNOSIS — J441 Chronic obstructive pulmonary disease with (acute) exacerbation: Secondary | ICD-10-CM | POA: Diagnosis not present

## 2014-02-09 DIAGNOSIS — R000 Tachycardia, unspecified: Secondary | ICD-10-CM | POA: Diagnosis present

## 2014-02-09 DIAGNOSIS — I359 Nonrheumatic aortic valve disorder, unspecified: Secondary | ICD-10-CM

## 2014-02-09 DIAGNOSIS — J810 Acute pulmonary edema: Secondary | ICD-10-CM | POA: Diagnosis present

## 2014-02-09 DIAGNOSIS — I2510 Atherosclerotic heart disease of native coronary artery without angina pectoris: Secondary | ICD-10-CM | POA: Diagnosis present

## 2014-02-09 DIAGNOSIS — R509 Fever, unspecified: Secondary | ICD-10-CM | POA: Insufficient documentation

## 2014-02-09 DIAGNOSIS — F17210 Nicotine dependence, cigarettes, uncomplicated: Secondary | ICD-10-CM | POA: Diagnosis present

## 2014-02-09 DIAGNOSIS — E782 Mixed hyperlipidemia: Secondary | ICD-10-CM | POA: Diagnosis present

## 2014-02-09 DIAGNOSIS — R079 Chest pain, unspecified: Secondary | ICD-10-CM

## 2014-02-09 DIAGNOSIS — I10 Essential (primary) hypertension: Secondary | ICD-10-CM | POA: Diagnosis present

## 2014-02-09 DIAGNOSIS — Z823 Family history of stroke: Secondary | ICD-10-CM | POA: Diagnosis not present

## 2014-02-09 DIAGNOSIS — R062 Wheezing: Secondary | ICD-10-CM | POA: Insufficient documentation

## 2014-02-09 DIAGNOSIS — I5033 Acute on chronic diastolic (congestive) heart failure: Principal | ICD-10-CM | POA: Diagnosis present

## 2014-02-09 DIAGNOSIS — I214 Non-ST elevation (NSTEMI) myocardial infarction: Secondary | ICD-10-CM | POA: Diagnosis present

## 2014-02-09 DIAGNOSIS — J811 Chronic pulmonary edema: Secondary | ICD-10-CM | POA: Diagnosis present

## 2014-02-09 DIAGNOSIS — E785 Hyperlipidemia, unspecified: Secondary | ICD-10-CM | POA: Diagnosis present

## 2014-02-09 DIAGNOSIS — I5032 Chronic diastolic (congestive) heart failure: Secondary | ICD-10-CM | POA: Insufficient documentation

## 2014-02-09 DIAGNOSIS — R0602 Shortness of breath: Secondary | ICD-10-CM | POA: Diagnosis present

## 2014-02-09 DIAGNOSIS — Z85038 Personal history of other malignant neoplasm of large intestine: Secondary | ICD-10-CM

## 2014-02-09 DIAGNOSIS — I129 Hypertensive chronic kidney disease with stage 1 through stage 4 chronic kidney disease, or unspecified chronic kidney disease: Secondary | ICD-10-CM | POA: Diagnosis present

## 2014-02-09 DIAGNOSIS — N189 Chronic kidney disease, unspecified: Secondary | ICD-10-CM

## 2014-02-09 DIAGNOSIS — I160 Hypertensive urgency: Secondary | ICD-10-CM | POA: Diagnosis present

## 2014-02-09 HISTORY — DX: Malignant (primary) neoplasm, unspecified: C80.1

## 2014-02-09 HISTORY — DX: Malignant neoplasm of prostate: C61

## 2014-02-09 HISTORY — DX: Anemia, unspecified: D64.9

## 2014-02-09 HISTORY — DX: Atherosclerotic heart disease of native coronary artery without angina pectoris: I25.10

## 2014-02-09 LAB — URINALYSIS, ROUTINE W REFLEX MICROSCOPIC
Bilirubin Urine: NEGATIVE
Glucose, UA: NEGATIVE mg/dL
KETONES UR: NEGATIVE mg/dL
LEUKOCYTES UA: NEGATIVE
NITRITE: NEGATIVE
PROTEIN: 100 mg/dL — AB
Specific Gravity, Urine: 1.008 (ref 1.005–1.030)
UROBILINOGEN UA: 1 mg/dL (ref 0.0–1.0)
pH: 6 (ref 5.0–8.0)

## 2014-02-09 LAB — URINE MICROSCOPIC-ADD ON

## 2014-02-09 LAB — TROPONIN I
TROPONIN I: 0.42 ng/mL — AB (ref <0.30)
Troponin I: 0.49 ng/mL (ref <0.30)

## 2014-02-09 LAB — POCT I-STAT 3, ART BLOOD GAS (G3+)
Acid-base deficit: 4 mmol/L — ABNORMAL HIGH (ref 0.0–2.0)
BICARBONATE: 24.5 meq/L — AB (ref 20.0–24.0)
Bicarbonate: 23.7 mEq/L (ref 20.0–24.0)
O2 Saturation: 100 %
O2 Saturation: 99 %
PCO2 ART: 37.3 mmHg (ref 35.0–45.0)
PH ART: 7.275 — AB (ref 7.350–7.450)
PH ART: 7.423 (ref 7.350–7.450)
Patient temperature: 36.2
TCO2: 25 mmol/L (ref 0–100)
TCO2: 26 mmol/L (ref 0–100)
pCO2 arterial: 51.1 mmHg — ABNORMAL HIGH (ref 35.0–45.0)
pO2, Arterial: 219 mmHg — ABNORMAL HIGH (ref 80.0–100.0)
pO2, Arterial: 116 mmHg — ABNORMAL HIGH (ref 80.0–100.0)

## 2014-02-09 LAB — PROTIME-INR
INR: 1.18 (ref 0.00–1.49)
PROTHROMBIN TIME: 15.2 s (ref 11.6–15.2)

## 2014-02-09 LAB — CBC WITH DIFFERENTIAL/PLATELET
Basophils Absolute: 0.1 10*3/uL (ref 0.0–0.1)
Basophils Relative: 1 % (ref 0–1)
EOS ABS: 2.4 10*3/uL — AB (ref 0.0–0.7)
EOS PCT: 24 % — AB (ref 0–5)
HCT: 38 % — ABNORMAL LOW (ref 39.0–52.0)
HEMOGLOBIN: 12.3 g/dL — AB (ref 13.0–17.0)
LYMPHS ABS: 2.8 10*3/uL (ref 0.7–4.0)
LYMPHS PCT: 28 % (ref 12–46)
MCH: 29.1 pg (ref 26.0–34.0)
MCHC: 32.4 g/dL (ref 30.0–36.0)
MCV: 89.8 fL (ref 78.0–100.0)
MONOS PCT: 6 % (ref 3–12)
Monocytes Absolute: 0.6 10*3/uL (ref 0.1–1.0)
NEUTROS PCT: 41 % — AB (ref 43–77)
Neutro Abs: 4.1 10*3/uL (ref 1.7–7.7)
Platelets: 143 10*3/uL — ABNORMAL LOW (ref 150–400)
RBC: 4.23 MIL/uL (ref 4.22–5.81)
RDW: 14.8 % (ref 11.5–15.5)
WBC: 9.9 10*3/uL (ref 4.0–10.5)

## 2014-02-09 LAB — BASIC METABOLIC PANEL
Anion gap: 18 — ABNORMAL HIGH (ref 5–15)
BUN: 27 mg/dL — ABNORMAL HIGH (ref 6–23)
CO2: 19 mEq/L (ref 19–32)
Calcium: 9.9 mg/dL (ref 8.4–10.5)
Chloride: 104 mEq/L (ref 96–112)
Creatinine, Ser: 1.94 mg/dL — ABNORMAL HIGH (ref 0.50–1.35)
GFR calc Af Amer: 36 mL/min — ABNORMAL LOW (ref >90)
GFR, EST NON AFRICAN AMERICAN: 31 mL/min — AB (ref >90)
GLUCOSE: 215 mg/dL — AB (ref 70–99)
POTASSIUM: 4.3 meq/L (ref 3.7–5.3)
Sodium: 141 mEq/L (ref 137–147)

## 2014-02-09 LAB — PRO B NATRIURETIC PEPTIDE: PRO B NATRI PEPTIDE: 17006 pg/mL — AB (ref 0–450)

## 2014-02-09 LAB — MRSA PCR SCREENING: MRSA BY PCR: NEGATIVE

## 2014-02-09 MED ORDER — AMLODIPINE BESYLATE 10 MG PO TABS
10.0000 mg | ORAL_TABLET | Freq: Every day | ORAL | Status: DC
  Administered 2014-02-09 – 2014-02-13 (×5): 10 mg via ORAL
  Filled 2014-02-09 (×5): qty 1

## 2014-02-09 MED ORDER — SODIUM CHLORIDE 0.9 % IJ SOLN
3.0000 mL | Freq: Two times a day (BID) | INTRAMUSCULAR | Status: DC
  Administered 2014-02-09 – 2014-02-13 (×9): 3 mL via INTRAVENOUS

## 2014-02-09 MED ORDER — FUROSEMIDE 10 MG/ML IJ SOLN
40.0000 mg | Freq: Once | INTRAMUSCULAR | Status: AC
  Administered 2014-02-09: 40 mg via INTRAVENOUS

## 2014-02-09 MED ORDER — NITROGLYCERIN IN D5W 200-5 MCG/ML-% IV SOLN
0.0000 ug/min | INTRAVENOUS | Status: DC
  Administered 2014-02-09 (×2): 40 ug/min via INTRAVENOUS

## 2014-02-09 MED ORDER — ISOSORBIDE MONONITRATE ER 60 MG PO TB24
60.0000 mg | ORAL_TABLET | Freq: Every day | ORAL | Status: DC
  Administered 2014-02-09 – 2014-02-13 (×5): 60 mg via ORAL
  Filled 2014-02-09 (×5): qty 1

## 2014-02-09 MED ORDER — ONDANSETRON HCL 4 MG/2ML IJ SOLN: 4.0000 mg | Freq: Four times a day (QID) | INTRAMUSCULAR | Status: DC | PRN

## 2014-02-09 MED ORDER — SODIUM CHLORIDE 0.9 % IJ SOLN: 3.0000 mL | INTRAMUSCULAR | Status: DC | PRN

## 2014-02-09 MED ORDER — SODIUM CHLORIDE 0.9 % IV SOLN
250.0000 mL | INTRAVENOUS | Status: DC | PRN
  Administered 2014-02-10: 250 mL via INTRAVENOUS

## 2014-02-09 MED ORDER — TRAMADOL HCL 50 MG PO TABS: 50.0000 mg | ORAL_TABLET | Freq: Four times a day (QID) | ORAL | Status: DC | PRN

## 2014-02-09 MED ORDER — METOPROLOL TARTRATE 12.5 MG HALF TABLET
12.5000 mg | ORAL_TABLET | Freq: Two times a day (BID) | ORAL | Status: DC
  Administered 2014-02-09 – 2014-02-10 (×4): 12.5 mg via ORAL
  Filled 2014-02-09 (×6): qty 1

## 2014-02-09 MED ORDER — PNEUMOCOCCAL VAC POLYVALENT 25 MCG/0.5ML IJ INJ
0.5000 mL | INJECTION | INTRAMUSCULAR | Status: AC
Start: 2014-02-10 — End: 2014-02-10
  Administered 2014-02-10: 0.5 mL via INTRAMUSCULAR
  Filled 2014-02-09: qty 0.5

## 2014-02-09 MED ORDER — NITROGLYCERIN 0.4 MG SL SUBL
0.4000 mg | SUBLINGUAL_TABLET | SUBLINGUAL | Status: DC | PRN
  Administered 2014-02-10 (×3): 0.4 mg via SUBLINGUAL
  Filled 2014-02-09 (×2): qty 1

## 2014-02-09 MED ORDER — ACETAMINOPHEN 325 MG PO TABS
650.0000 mg | ORAL_TABLET | ORAL | Status: DC | PRN
  Administered 2014-02-10: 650 mg via ORAL

## 2014-02-09 MED ORDER — INFLUENZA VAC SPLIT QUAD 0.5 ML IM SUSY
0.5000 mL | PREFILLED_SYRINGE | INTRAMUSCULAR | Status: AC
  Administered 2014-02-10: 0.5 mL via INTRAMUSCULAR
  Filled 2014-02-09: qty 0.5

## 2014-02-09 MED ORDER — NITROGLYCERIN IN D5W 200-5 MCG/ML-% IV SOLN
0.0000 ug/min | Freq: Once | INTRAVENOUS | Status: AC
  Administered 2014-02-09: 40 ug/min via INTRAVENOUS

## 2014-02-09 MED ORDER — ATORVASTATIN CALCIUM 40 MG PO TABS
40.0000 mg | ORAL_TABLET | Freq: Every evening | ORAL | Status: DC
  Administered 2014-02-09 – 2014-02-12 (×4): 40 mg via ORAL
  Filled 2014-02-09 (×5): qty 1

## 2014-02-09 NOTE — ED Notes (Signed)
Pt here from home with c/o sob and chest pain , pt placed on cpap on arrival and a nitro gtt due to his blood pressure ,

## 2014-02-09 NOTE — ED Provider Notes (Signed)
CSN: [id number]     Arrival date & time 02/09/14  0718 History   First MD Initiated Contact with Patient 02/09/14 [phone]     Chief Complaint  Patient presents with  . Respiratory Distress     (Consider location/radiation/quality/duration/timing/severity/associated sxs/prior Treatment) HPI Comments: Patient presents to the ER for evaluation of chest pain. Patient reports that he has been feeling short of breath through the night. Symptoms worsened this morning and then he had onset of substernal chest pain and pressure. He called EMS and was brought to the ER. Patient has been markedly hypertensive, tachycardic and very short of breath during transport.   Past Medical History  Diagnosis Date  . Hypertension    History reviewed. No pertinent past surgical history. Family History  Problem Relation Age of Onset  . Stroke Mother    History  Substance Use Topics  . Smoking status: Current Some Day Smoker  . Smokeless tobacco: Not on file  . Alcohol Use: No    Review of Systems  Respiratory: Positive for shortness of breath.   Cardiovascular: Positive for chest pain.  All other systems reviewed and are negative.     Allergies  Review of patient's allergies indicates no known allergies.  Home Medications   Prior to Admission medications   Medication Sig Start Date End Date Taking? Authorizing Provider  amLODipine (NORVASC) 10 MG tablet Take 10 mg by mouth daily.    Historical Provider, MD  atorvastatin (LIPITOR) 40 MG tablet Take 1 tablet (40 mg total) by mouth daily. 01/29/14   Belva Crome III, MD  IRON PO Take 1 tablet by mouth at bedtime.    Historical Provider, MD  isosorbide mononitrate (IMDUR) 60 MG 24 hr tablet Take 1 tablet (60 mg total) by mouth daily. 01/29/14   Belva Crome III, MD  lisinopril (PRINIVIL,ZESTRIL) 40 MG tablet Take 40 mg by mouth daily.    Historical Provider, MD  metoprolol tartrate (LOPRESSOR) 25 MG tablet Take 0.5 tablets (12.5 mg total) by mouth  2 (two) times daily. 01/29/14   Belva Crome III, MD  nitroGLYCERIN (NITROSTAT) 0.4 MG SL tablet Place 1 tablet (0.4 mg total) under the tongue every 5 (five) minutes as needed. For chest pain 01/29/14   Belva Crome III, MD  traMADol (ULTRAM) 50 MG tablet Take 1 tablet (50 mg total) by mouth every 6 (six) hours as needed. 05/30/13   Noland Fordyce, PA-C   BP 168/101 mmHg  Pulse 80  Temp(Src) 97.2 F (36.2 C)  Resp 19  SpO2 95% Physical Exam  Constitutional: He is oriented to person, place, and time. He appears well-developed and well-nourished. He appears distressed.  HENT:  Head: Normocephalic and atraumatic.  Right Ear: Hearing normal.  Left Ear: Hearing normal.  Nose: Nose normal.  Mouth/Throat: Oropharynx is clear and moist and mucous membranes are normal.  Eyes: Conjunctivae and EOM are normal. Pupils are equal, round, and reactive to light.  Neck: Normal range of motion. Neck supple.  Cardiovascular: Regular rhythm, S1 normal and S2 normal.  Tachycardia present.  Exam reveals no gallop and no friction rub.   No murmur heard. Pulmonary/Chest: Accessory muscle usage present. Tachypnea noted. He is in respiratory distress. He has rales. He exhibits no tenderness.  Abdominal: Soft. Normal appearance and bowel sounds are normal. There is no hepatosplenomegaly. There is no tenderness. There is no rebound, no guarding, no tenderness at McBurney's point and negative Murphy's sign. No hernia.  Musculoskeletal: Normal range of  motion.  Neurological: He is alert and oriented to person, place, and time. He has normal strength. No cranial nerve deficit or sensory deficit. Coordination normal. GCS eye subscore is 4. GCS verbal subscore is 5. GCS motor subscore is 6.  Skin: Skin is warm and intact. No rash noted. He is diaphoretic. No cyanosis.  Psychiatric: He has a normal mood and affect. His speech is normal and behavior is normal. Thought content normal.  Nursing note and vitals  reviewed.   ED Course  Procedures (including critical care time) Labs Review Labs Reviewed  CBC WITH DIFFERENTIAL - Abnormal; Notable for the following:    Hemoglobin 12.3 (*)    HCT 38.0 (*)    Platelets 143 (*)    Neutrophils Relative % 41 (*)    Eosinophils Relative 24 (*)    Eosinophils Absolute 2.4 (*)    All other components within normal limits  BASIC METABOLIC PANEL - Abnormal; Notable for the following:    Glucose, Bld 215 (*)    BUN 27 (*)    Creatinine, Ser 1.94 (*)    GFR calc non Af Amer 31 (*)    GFR calc Af Amer 36 (*)    Anion gap 18 (*)    All other components within normal limits  PRO B NATRIURETIC PEPTIDE - Abnormal; Notable for the following:    Pro B Natriuretic peptide (BNP) 17006.0 (*)    All other components within normal limits  TROPONIN I  PROTIME-INR  URINALYSIS, ROUTINE W REFLEX MICROSCOPIC  I-STAT CG4 LACTIC ACID, ED  I-STAT TROPOININ, ED  I-STAT CHEM 8, ED    Imaging Review Dg Chest Portable 1 View  02/09/2014   CLINICAL DATA:  Chest pain and difficulty breathing  EXAM: PORTABLE CHEST - 1 VIEW  COMPARISON:  April 29, 2010  FINDINGS: There is a degree of underlying emphysematous change. There is no edema or consolidation. Heart is enlarged with pulmonary vascularity within normal limits. No adenopathy. No bone lesions.  IMPRESSION: Cardiomegaly.  Underlying emphysema.  No edema or consolidation.   Electronically Signed   By: Lowella Grip M.D.   On: 02/09/2014 07:40     EKG Interpretation None       Date: 02/09/2014  Rate: 130  Rhythm: sinus tachycardia  QRS Axis: normal  Intervals: normal  ST/T Wave abnormalities: ST elevations anteriorly, ST depressions inferiorly and ST depressions laterally  Conduction Disutrbances:LVH  Narrative Interpretation:   Old EKG Reviewed: changes noted and anterior ST elevation are old, inf-lat depressions new, likely related to rate    MDM   Final diagnoses:  Acute pulmonary edema   Hypertensive emergency   Patient presented to the ER for evaluation of chest pain. Patient had been feeling short of breath over the course of the night and then suddenly worsened this morning. Upon arrival to the ER, patient is markedly hypertensive. EKG showed anterior ST elevations, but comparison to previous EKG reveals no change. He does have new subtle inferior and lateral depressions and tachycardia. Depressions might be rate related. Patient administered nitroglycerin. IV nitroglycerin was chosen because of his severe hypertension. Chest pain resolved, patient continued to be hypertensive. Examination revealed diffuse rales, consistent with pulmonary edema. Patient administered IV Lasix. Current presentation is consistent with hypertensive emergency with pulmonary edema. Cardiology has been consult, will admit the patient.  CRITICAL CARE Performed by: Orpah Greek   Total critical care time: 10min  Critical care time was exclusive of separately billable procedures and treating  other patients.  Critical care was necessary to treat or prevent imminent or life-threatening deterioration.  Critical care was time spent personally by me on the following activities: development of treatment plan with patient and/or surrogate as well as nursing, discussions with consultants, evaluation of patient's response to treatment, examination of patient, obtaining history from patient or surrogate, ordering and performing treatments and interventions, ordering and review of laboratory studies, ordering and review of radiographic studies, pulse oximetry and re-evaluation of patient's condition.     Orpah Greek, MD 02/09/14 [id number]

## 2014-02-09 NOTE — ED Notes (Signed)
Attempted report 

## 2014-02-09 NOTE — ED Notes (Signed)
Nitro gtt up to 40 mcg

## 2014-02-09 NOTE — ED Notes (Signed)
NTG rate changed to 66mcg

## 2014-02-09 NOTE — Plan of Care (Signed)
Problem: Phase I Progression Outcomes Goal: Dyspnea controlled at rest (HF) Outcome: Progressing

## 2014-02-09 NOTE — ED Notes (Signed)
Admitting MD at bedside talking with pt and family

## 2014-02-09 NOTE — H&P (Signed)
CARDIOLOGY HISTORY AND PHYSICAL   Patient ID: Nahir Daurio MRN: [id number]  DOB/AGE: Sep 08, 1930 80 y.o. Admit date: 02/09/2014  Primary Care Physician: [name], MD Primary Cardiologist:  Pernell Dupre  Clinical Summary Mr. Gayler is a 80 y.o.male. Patient presents with chest pain and pulmonary edema. He has known coronary disease. He had PCI to the right coronary artery in 2012. At that time there was an ejection fraction of 60% with inferior hypokinesis. He had seen Dr. Tamala Julian in the office recently with increasing chest pain. Dr. Tamala Julian wanted to proceed with catheterization. At that time the patient decided he did not want to proceed. It is not clear if the patient fully understands all of these issues. For several days he has had increased shortness of breath and chest discomfort. He presented the emergency room with pulmonary edema. He is responding to BiPAP. Foley has been placed and he is now having good urine output. His creatinine is up to 1.9. This is a significant increase from the last value available to me in the range of 1.18. I spoken with the patient's family in the emergency room also. He has severe ongoing hypertension at this point despite IV nitroglycerin. His blood pressure was mildly elevated in the office when seen recently. BNP is markedly elevated at 17,000. His first troponin is normal.   No Known Allergies  Home Medications  (Not in a hospital admission)  Scheduled Medications    Infusions    PRN Medications       Past Medical History  Diagnosis Date  . Hypertension     History reviewed. No pertinent past surgical history.  Family History  Problem Relation Age of Onset  . Stroke Mother     Social History Mr. Mccurdy reports that he has been smoking.  He does not have any smokeless tobacco history on file. Mr. Damery reports that he does not drink alcohol.   Review of systems: At this time patient denies fever, chills, headache,  sweats, rash, change in vision, change in hearing, nausea vomiting, urinary symptoms. All other systems are reviewed and are negative.  Physical examination: Patient is oriented to person time and place. Affect is normal in the setting of this shortness of breath. Head is atraumatic. Sclera and conjunctiva are normal. There is jugular venous distention. Lungs reveal diffuse rales. With BiPAP in place he is not uncomfortable. Cardiac exam revealed distant heart sounds. Abdomen is soft. He has no significant peripheral edema. There are no musculoskeletal deformities. There are no skin rashes. Neurologic is grossly intact. Overall he is uncomfortable but feeling better with BiPAP in place.    Neuropsychiatric: Alert and oriented x3, affect grossly appropriate.   Lab Results  Basic Metabolic Panel:  Recent Labs Lab 02/09/14 0702  NA 141  K 4.3  CL 104  CO2 19  GLUCOSE 215*  BUN 27*  CREATININE 1.94*  CALCIUM 9.9    Liver Function Tests: No results for input(s): AST, ALT, ALKPHOS, BILITOT, PROT, ALBUMIN in the last 168 hours.  CBC:  Recent Labs Lab 02/09/14 0702  WBC 9.9  NEUTROABS 4.1  HGB 12.3*  HCT 38.0*  MCV 89.8  PLT 143*    Cardiac Enzymes:  Recent Labs Lab 02/09/14 0702  TROPONINI <0.30    BNP: Invalid input(s): POCBNP   Radiology Dg Chest Portable 1 View  02/09/2014   CLINICAL DATA:  Chest pain and difficulty breathing  EXAM: PORTABLE CHEST - 1 VIEW  COMPARISON:  April 29, 2010  FINDINGS: There is a degree of underlying emphysematous change. There is no edema or consolidation. Heart is enlarged with pulmonary vascularity within normal limits. No adenopathy. No bone lesions.  IMPRESSION: Cardiomegaly.  Underlying emphysema.  No edema or consolidation.   Electronically Signed   By: Lowella Grip M.D.   On: 02/09/2014 07:40    Telemetry reveals sinus rhythm/sinus tachycardia  EKG reveals no acute ST changes.  Impression and Recommendations     Coronary artery disease     The patient underwent stenting of the right coronary artery in 2012. When he saw Dr. Tamala Julian in November, 2015, he was having some increase in his chest discomfort. Cardiac catheterization was recommended. The patient was hasn't at that time and never made plans to have his catheterization. He has had chest discomfort with his shortness of breath recently. However he presents currently with a normal troponin. There is no acute EKG change. We will first have to treat his acute pulmonary edema and his hypertension. Then decisions can be made as to whether or not proceeding with catheterization is appropriate. The patient does not appear to have a full understanding of his situation. He thought he might be too old to have further heart testing. I've spoken with him and his family member about this.    Essential hypertension       He presents with severe hypertension today. In the office in November he had mild hypertension. I suspect that most of his hypertensive response is related to his acute illness. He is beginning to diuresis and he is receiving IV nitroglycerin. We will watch his blood pressure over the next hour. If there is not significant improvement I will add IV labetalol.    Hyperlipidemia         Pulmonary edema     The patient does admit to having increasing shortness of breath over several days. However he does not have peripheral edema. He is diuresing well. Diuresis will be continued. Careful attempts will be made to control his blood pressure. When he is stable we will need follow-up 2-D echo. At the time of his catheterization in 2012, his EF was said to be 60% with inferior hypokinesis. I do not see any echo data since that time.   Acute renal failure    Historically his creatinine runs in the range of 1.2. Today it is 1.9. I'm hopeful that he'll have improvement as he diuresis and his blood pressure stabilizes.  Daryel November, MD 02/09/2014, 8:25 AM

## 2014-02-10 DIAGNOSIS — I5033 Acute on chronic diastolic (congestive) heart failure: Secondary | ICD-10-CM | POA: Diagnosis not present

## 2014-02-10 DIAGNOSIS — I160 Hypertensive urgency: Secondary | ICD-10-CM | POA: Diagnosis present

## 2014-02-10 DIAGNOSIS — J438 Other emphysema: Secondary | ICD-10-CM

## 2014-02-10 DIAGNOSIS — I248 Other forms of acute ischemic heart disease: Secondary | ICD-10-CM

## 2014-02-10 DIAGNOSIS — J810 Acute pulmonary edema: Secondary | ICD-10-CM | POA: Diagnosis present

## 2014-02-10 DIAGNOSIS — J449 Chronic obstructive pulmonary disease, unspecified: Secondary | ICD-10-CM | POA: Diagnosis present

## 2014-02-10 HISTORY — PX: CARDIAC CATHETERIZATION: SHX172

## 2014-02-10 LAB — BASIC METABOLIC PANEL
ANION GAP: 15 (ref 5–15)
BUN: 25 mg/dL — ABNORMAL HIGH (ref 6–23)
CHLORIDE: 97 meq/L (ref 96–112)
CO2: 25 mEq/L (ref 19–32)
Calcium: 9.8 mg/dL (ref 8.4–10.5)
Creatinine, Ser: 1.7 mg/dL — ABNORMAL HIGH (ref 0.50–1.35)
GFR calc Af Amer: 42 mL/min — ABNORMAL LOW (ref >90)
GFR calc non Af Amer: 36 mL/min — ABNORMAL LOW (ref >90)
GLUCOSE: 103 mg/dL — AB (ref 70–99)
POTASSIUM: 3.8 meq/L (ref 3.7–5.3)
SODIUM: 137 meq/L (ref 137–147)

## 2014-02-10 LAB — PRO B NATRIURETIC PEPTIDE: PRO B NATRI PEPTIDE: 27880 pg/mL — AB (ref 0–450)

## 2014-02-10 LAB — TROPONIN I: Troponin I: 0.34 ng/mL (ref <0.30)

## 2014-02-10 MED ORDER — FLUTICASONE PROPIONATE HFA 110 MCG/ACT IN AERO
1.0000 | INHALATION_SPRAY | Freq: Two times a day (BID) | RESPIRATORY_TRACT | Status: DC
  Administered 2014-02-10 – 2014-02-11 (×2): 1 via RESPIRATORY_TRACT
  Filled 2014-02-10: qty 12

## 2014-02-10 MED ORDER — FUROSEMIDE 10 MG/ML IJ SOLN
INTRAMUSCULAR | Status: AC
  Filled 2014-02-10: qty 4

## 2014-02-10 MED ORDER — TIOTROPIUM BROMIDE MONOHYDRATE 18 MCG IN CAPS
18.0000 ug | ORAL_CAPSULE | Freq: Every day | RESPIRATORY_TRACT | Status: DC
  Administered 2014-02-11 – 2014-02-13 (×3): 18 ug via RESPIRATORY_TRACT
  Filled 2014-02-10: qty 5

## 2014-02-10 MED ORDER — MORPHINE SULFATE 2 MG/ML IJ SOLN
0.5000 mg | Freq: Once | INTRAMUSCULAR | Status: AC | PRN
  Administered 2014-02-10: 0.5 mg via INTRAVENOUS
  Filled 2014-02-10: qty 1

## 2014-02-10 MED ORDER — HYDRALAZINE HCL 25 MG PO TABS
25.0000 mg | ORAL_TABLET | Freq: Three times a day (TID) | ORAL | Status: DC
  Administered 2014-02-10 – 2014-02-11 (×3): 25 mg via ORAL
  Filled 2014-02-10 (×6): qty 1

## 2014-02-10 MED ORDER — HYDRALAZINE HCL 20 MG/ML IJ SOLN
10.0000 mg | Freq: Four times a day (QID) | INTRAMUSCULAR | Status: DC | PRN
  Administered 2014-02-10 – 2014-02-12 (×2): 10 mg via INTRAVENOUS
  Filled 2014-02-10 (×2): qty 1

## 2014-02-10 MED ORDER — ALBUTEROL SULFATE HFA 108 (90 BASE) MCG/ACT IN AERS
2.0000 | INHALATION_SPRAY | Freq: Four times a day (QID) | RESPIRATORY_TRACT | Status: DC
  Administered 2014-02-10 – 2014-02-11 (×4): 2 via RESPIRATORY_TRACT
  Filled 2014-02-10: qty 6.7

## 2014-02-10 MED ORDER — FUROSEMIDE 10 MG/ML IJ SOLN
40.0000 mg | Freq: Once | INTRAMUSCULAR | Status: AC
  Administered 2014-02-10: 40 mg via INTRAVENOUS

## 2014-02-10 MED ORDER — MORPHINE SULFATE 2 MG/ML IJ SOLN: 1.0000 mg | Freq: Once | INTRAMUSCULAR | Status: DC | PRN

## 2014-02-10 NOTE — Progress Notes (Signed)
Primary Cardiologist: Pernell Dupre  Subjective:    80 year old with CAD worsening chest pain who recently did not want to proceed with cath. Prior PCI to RCA 2012. Presented with respiratory failure-Bipap-lasix-HTN urgency, BNP 17000 with mildly elevated troponin. Had chest pain.   Feeling a little better. NTG IV   Objective:  Vital Signs in the last 24 hours: Temp:  [97.2 F (36.2 C)-99.8 F (37.7 C)] 98 F (36.7 C) (12/05 0800) Pulse Rate:  [59-124] 94 (12/05 0929) Resp:  [13-28] 18 (12/05 0800) BP: (146-208)/(62-125) 156/75 mmHg (12/05 0929) SpO2:  [42 %-100 %] 99 % (12/05 0800) Weight:  [127 lb (57.607 kg)-132 lb 4.4 oz (60 kg)] 127 lb (57.607 kg) (12/05 0319)  Intake/Output from previous day: 12/04 0701 - 12/05 0700 In: 735.9 [P.O.:240; I.V.:495.9] Out: 5350 [Urine:5350]   -4.3 liters out  Scheduled Meds: . amLODipine  10 mg Oral Daily  . atorvastatin  40 mg Oral QPM  . isosorbide mononitrate  60 mg Oral Daily  . metoprolol tartrate  12.5 mg Oral BID  . sodium chloride  3 mL Intravenous Q12H   Continuous Infusions: . nitroGLYCERIN 50 mcg/min (02/10/14 0800)   PRN Meds:.sodium chloride, acetaminophen, nitroGLYCERIN, ondansetron (ZOFRAN) IV, sodium chloride, traMADol  Physical Exam: General: Thin in no acute distress. Laying on side Head:  Normocephalic and atraumatic. Lungs: Diffuse mild wheeze, no rales. Heart: Normal S1 and S2.  No murmur, rubs or gallops.  Abdomen: soft, non-tender, positive bowel sounds. Extremities: No clubbing or cyanosis. No edema. Neurologic: Alert and oriented x 3.    Lab Results:  Recent Labs  02/09/14 0702  WBC 9.9  HGB 12.3*  PLT 143*    Recent Labs  02/09/14 0702 02/10/14 0148  NA 141 137  K 4.3 3.8  CL 104 97  CO2 19 25  GLUCOSE 215* 103*  BUN 27* 25*  CREATININE 1.94* 1.70*    Recent Labs  02/09/14 1937 02/10/14 0148  TROPONINI 0.42* 0.34*    Imaging: Dg Chest Portable 1 View  02/09/2014    CLINICAL DATA:  Chest pain and difficulty breathing  EXAM: PORTABLE CHEST - 1 VIEW  COMPARISON:  April 29, 2010  FINDINGS: There is a degree of underlying emphysematous change. There is no edema or consolidation. Heart is enlarged with pulmonary vascularity within normal limits. No adenopathy. No bone lesions.  IMPRESSION: Cardiomegaly.  Underlying emphysema.  No edema or consolidation.   Electronically Signed   By: Lowella Grip M.D.   On: 02/09/2014 07:40    Telemetry: NSR Personally viewed.   EKG:  11/23 - SR, LVH, NSSTW changes.   Cardiac Studies:  Trop 0.49 to 0.34 BNP now 28000  Assessment/Plan:   80 year old with PCI to RCA in 2012 admitted with acute respiratory failure secondary to acute diastolic heart failure, COPD.  Acute diastolic heart failure  - Great diuresis and improvement in symptoms   - Creat down to 1.7 from 1.9  - BNP up to 28000  - Will give him one more dose of IV lasix 40mg  (out 4 liters now)  - Off Bipap  - CXR interestingly no reported edema (?COPD playing a role)  - wean off IV ntg.  Elevated troponin  - mild elevation trending down, possibly demand ischemia  - no chest pain currently. Improved after treatment of severe HTN  - he is not very interested in cath.  - check ECHO tomorrow  COPD  - should likely be on chronic inhaler.   -  will start Spiriva, Flovent (mild wheeze), albuterol inh.   - will avoid oral prednisone with recent fluid overload.   CKD 3  - watch with diuresis.   - previously in 1.2 range.   - also comes into play if cath requested.   Hypertensive Urgency  - resolved after diuresis  - weaning off NTG IV.     Jeanine Caven 02/10/2014, 10:03 AM

## 2014-02-10 NOTE — Plan of Care (Signed)
Problem: Phase I Progression Outcomes Goal: Voiding-avoid urinary catheter unless indicated Outcome: Completed/Met Date Met:  02/10/14

## 2014-02-11 ENCOUNTER — Encounter (HOSPITAL_COMMUNITY): Payer: Self-pay | Admitting: Internal Medicine

## 2014-02-11 ENCOUNTER — Inpatient Hospital Stay (HOSPITAL_COMMUNITY): Payer: PRIVATE HEALTH INSURANCE

## 2014-02-11 DIAGNOSIS — I161 Hypertensive emergency: Secondary | ICD-10-CM | POA: Insufficient documentation

## 2014-02-11 DIAGNOSIS — R062 Wheezing: Secondary | ICD-10-CM | POA: Insufficient documentation

## 2014-02-11 DIAGNOSIS — J810 Acute pulmonary edema: Secondary | ICD-10-CM

## 2014-02-11 DIAGNOSIS — J449 Chronic obstructive pulmonary disease, unspecified: Secondary | ICD-10-CM

## 2014-02-11 DIAGNOSIS — I10 Essential (primary) hypertension: Secondary | ICD-10-CM

## 2014-02-11 DIAGNOSIS — R509 Fever, unspecified: Secondary | ICD-10-CM | POA: Clinically undetermined

## 2014-02-11 LAB — URINALYSIS, ROUTINE W REFLEX MICROSCOPIC
Bilirubin Urine: NEGATIVE
Glucose, UA: NEGATIVE mg/dL
Hgb urine dipstick: NEGATIVE
Ketones, ur: NEGATIVE mg/dL
LEUKOCYTES UA: NEGATIVE
NITRITE: NEGATIVE
Protein, ur: 100 mg/dL — AB
Specific Gravity, Urine: 1.024 (ref 1.005–1.030)
Urobilinogen, UA: 1 mg/dL (ref 0.0–1.0)
pH: 5 (ref 5.0–8.0)

## 2014-02-11 LAB — URINE MICROSCOPIC-ADD ON

## 2014-02-11 LAB — CBC WITH DIFFERENTIAL/PLATELET
BASOS PCT: 1 % (ref 0–1)
Basophils Absolute: 0 10*3/uL (ref 0.0–0.1)
EOS ABS: 0.2 10*3/uL (ref 0.0–0.7)
Eosinophils Relative: 5 % (ref 0–5)
HCT: 38.3 % — ABNORMAL LOW (ref 39.0–52.0)
HEMOGLOBIN: 12.9 g/dL — AB (ref 13.0–17.0)
Lymphocytes Relative: 12 % (ref 12–46)
Lymphs Abs: 0.6 10*3/uL — ABNORMAL LOW (ref 0.7–4.0)
MCH: 29.7 pg (ref 26.0–34.0)
MCHC: 33.7 g/dL (ref 30.0–36.0)
MCV: 88 fL (ref 78.0–100.0)
MONOS PCT: 9 % (ref 3–12)
Monocytes Absolute: 0.5 10*3/uL (ref 0.1–1.0)
NEUTROS PCT: 73 % (ref 43–77)
Neutro Abs: 3.5 10*3/uL (ref 1.7–7.7)
Platelets: 129 10*3/uL — ABNORMAL LOW (ref 150–400)
RBC: 4.35 MIL/uL (ref 4.22–5.81)
RDW: 14.5 % (ref 11.5–15.5)
WBC: 4.8 10*3/uL (ref 4.0–10.5)

## 2014-02-11 LAB — BASIC METABOLIC PANEL
ANION GAP: 14 (ref 5–15)
BUN: 27 mg/dL — ABNORMAL HIGH (ref 6–23)
CALCIUM: 10.3 mg/dL (ref 8.4–10.5)
CHLORIDE: 96 meq/L (ref 96–112)
CO2: 25 meq/L (ref 19–32)
Creatinine, Ser: 1.81 mg/dL — ABNORMAL HIGH (ref 0.50–1.35)
GFR calc non Af Amer: 33 mL/min — ABNORMAL LOW (ref >90)
GFR, EST AFRICAN AMERICAN: 39 mL/min — AB (ref >90)
Glucose, Bld: 94 mg/dL (ref 70–99)
Potassium: 3.6 mEq/L — ABNORMAL LOW (ref 3.7–5.3)
SODIUM: 135 meq/L — AB (ref 137–147)

## 2014-02-11 LAB — POCT I-STAT 3, ART BLOOD GAS (G3+)
Acid-Base Excess: 1 mmol/L (ref 0.0–2.0)
Bicarbonate: 24.3 mEq/L — ABNORMAL HIGH (ref 20.0–24.0)
O2 SAT: 96 %
PCO2 ART: 32.6 mmHg — AB (ref 35.0–45.0)
PO2 ART: 75 mmHg — AB (ref 80.0–100.0)
Patient temperature: 98.6
TCO2: 25 mmol/L (ref 0–100)
pH, Arterial: 7.481 — ABNORMAL HIGH (ref 7.350–7.450)

## 2014-02-11 MED ORDER — BUDESONIDE-FORMOTEROL FUMARATE 160-4.5 MCG/ACT IN AERO
2.0000 | INHALATION_SPRAY | Freq: Two times a day (BID) | RESPIRATORY_TRACT | Status: DC
  Administered 2014-02-11 – 2014-02-13 (×3): 2 via RESPIRATORY_TRACT
  Filled 2014-02-11: qty 6

## 2014-02-11 MED ORDER — FUROSEMIDE 20 MG PO TABS
20.0000 mg | ORAL_TABLET | Freq: Every day | ORAL | Status: DC
  Administered 2014-02-11 – 2014-02-13 (×3): 20 mg via ORAL
  Filled 2014-02-11 (×3): qty 1

## 2014-02-11 MED ORDER — IPRATROPIUM-ALBUTEROL 0.5-2.5 (3) MG/3ML IN SOLN
3.0000 mL | Freq: Four times a day (QID) | RESPIRATORY_TRACT | Status: DC
  Administered 2014-02-11 (×2): 3 mL via RESPIRATORY_TRACT
  Filled 2014-02-11 (×2): qty 3

## 2014-02-11 MED ORDER — POTASSIUM CHLORIDE CRYS ER 20 MEQ PO TBCR
20.0000 meq | EXTENDED_RELEASE_TABLET | Freq: Once | ORAL | Status: AC
  Administered 2014-02-11: 20 meq via ORAL
  Filled 2014-02-11: qty 1

## 2014-02-11 MED ORDER — ACETAMINOPHEN 325 MG PO TABS
650.0000 mg | ORAL_TABLET | Freq: Four times a day (QID) | ORAL | Status: DC | PRN
  Administered 2014-02-11: 650 mg via ORAL
  Filled 2014-02-11: qty 2

## 2014-02-11 MED ORDER — HYDRALAZINE HCL 50 MG PO TABS
50.0000 mg | ORAL_TABLET | Freq: Three times a day (TID) | ORAL | Status: DC
  Administered 2014-02-11 – 2014-02-13 (×6): 50 mg via ORAL
  Filled 2014-02-11 (×9): qty 1

## 2014-02-11 MED ORDER — IPRATROPIUM-ALBUTEROL 0.5-2.5 (3) MG/3ML IN SOLN
3.0000 mL | Freq: Two times a day (BID) | RESPIRATORY_TRACT | Status: DC
  Administered 2014-02-12: 3 mL via RESPIRATORY_TRACT
  Filled 2014-02-11: qty 3

## 2014-02-11 MED ORDER — IPRATROPIUM-ALBUTEROL 0.5-2.5 (3) MG/3ML IN SOLN
3.0000 mL | RESPIRATORY_TRACT | Status: DC | PRN
  Filled 2014-02-11: qty 3

## 2014-02-11 MED ORDER — CARVEDILOL 12.5 MG PO TABS
12.5000 mg | ORAL_TABLET | Freq: Two times a day (BID) | ORAL | Status: DC
  Administered 2014-02-11 – 2014-02-13 (×4): 12.5 mg via ORAL
  Filled 2014-02-11 (×7): qty 1

## 2014-02-11 NOTE — Progress Notes (Signed)
Primary Cardiologist: Pernell Dupre  Subjective:    80 year old with CAD worsening chest pain who recently did not want to proceed with cath. Prior PCI to RCA 2012. Presented with respiratory failure-Bipap-lasix-HTN urgency, BNP 17000 with mildly elevated troponin. Had chest pain.   Feeling a little better. NTG IV  Fever 101.4 overnight. Complained of being cold. Tylenol given.   He wants to go home. Had more SOB last night.    Objective:  Vital Signs in the last 24 hours: Temp:  [97.9 F (36.6 C)-101.4 F (38.6 C)] 97.9 F (36.6 C) (12/06 0700) Pulse Rate:  [53-116] 69 (12/06 0700) Resp:  [16-25] 16 (12/06 0700) BP: (142-202)/(57-108) 155/80 mmHg (12/06 0700) SpO2:  [92 %-100 %] 97 % (12/06 0818) FiO2 (%):  [21 %] 21 % (12/06 0818) Weight:  [122 lb 2.2 oz (55.4 kg)] 122 lb 2.2 oz (55.4 kg) (12/06 0500)  Intake/Output from previous day: 12/05 0701 - 12/06 0700 In: 688 [P.O.:560; I.V.:128] Out: 1525 [Urine:1525]   -4.3 liters out  Scheduled Meds: . albuterol  2 puff Inhalation Q6H  . amLODipine  10 mg Oral Daily  . atorvastatin  40 mg Oral QPM  . fluticasone  1 puff Inhalation BID  . hydrALAZINE  25 mg Oral Q8H  . isosorbide mononitrate  60 mg Oral Daily  . metoprolol tartrate  12.5 mg Oral BID  . sodium chloride  3 mL Intravenous Q12H  . tiotropium  18 mcg Inhalation Daily   Continuous Infusions:   PRN Meds:.sodium chloride, acetaminophen, acetaminophen, hydrALAZINE, nitroGLYCERIN, ondansetron (ZOFRAN) IV, sodium chloride, traMADol  Physical Exam: General: Thin in no acute distress. Laying on side Head:  Normocephalic and atraumatic. Lungs: Diffuse mild wheeze (better from yesterday), no rales. Heart: Normal S1 and S2.  No murmur, rubs or gallops.  Abdomen: soft, non-tender, positive bowel sounds. Extremities: No clubbing or cyanosis. No edema. Neurologic: Alert and oriented x 3.    Lab Results:  Recent Labs  02/09/14 0702  WBC 9.9  HGB 12.3*  PLT  143*    Recent Labs  02/10/14 0148 02/11/14 0333  NA 137 135*  K 3.8 3.6*  CL 97 96  CO2 25 25  GLUCOSE 103* 94  BUN 25* 27*  CREATININE 1.70* 1.81*    Recent Labs  02/09/14 1937 02/10/14 0148  TROPONINI 0.42* 0.34*    Telemetry: NSR Personally viewed.   EKG:  11/23 - SR, LVH, NSSTW changes.   Cardiac Studies:  Trop 0.49 to 0.34 BNP now 28000  Assessment/Plan:   80 year old with PCI to RCA in 2012 admitted with acute respiratory failure secondary to acute diastolic heart failure, COPD.  Acute diastolic heart failure  - Great diuresis and improvement in symptoms   - Creat down to 1.8 from 1.9  - BNP up to 28000  - Will hold IV lasix 40mg  (out 5.4 liters now) and give lasix 20mg  PO QD  - Off Bipap  - CXR interestingly no reported edema (?COPD playing a role)  - weaned off IV ntg.  - Starting coreg 12.5 BID  Elevated troponin  - mild elevation trending down, possibly demand ischemia  - no chest pain currently. Improved after treatment of severe HTN  - he is not very interested in cath but does not fully understand.  - daughter in room helped to explain to him. They are not opposed to heart cath but I would like to optimize meds first.  - check ECHO   COPD  -  should likely be on chronic inhaler.   - started Spiriva, Flovent  (wheeze), albuterol inh.   - will avoid oral prednisone with recent fluid overload.   - Will consult TRH with recent wheeze, now fever  CKD 3  - watch with diuresis.   - previously in 1.2 range.   - also comes into play if cath requested.   - Discussed chance of AKI with IV dye to daughter.   Hypertensive Urgency  - resolved after diuresis  - weaned off NTG IV.   - Starting coreg 12.5 BID, Hydral 50 TID    [name] 02/11/2014, 9:02 AM

## 2014-02-11 NOTE — Plan of Care (Signed)
Problem: Phase I Progression Outcomes Goal: Dyspnea controlled at rest (HF) Outcome: Completed/Met Date Met:  02/11/14 Goal: Pain controlled with appropriate interventions Outcome: Completed/Met Date Met:  02/11/14

## 2014-02-11 NOTE — Plan of Care (Signed)
Problem: Consults Goal: Heart Failure Patient Education (See Patient Education module for education specifics.)  Outcome: Completed/Met Date Met:  02/11/14 Goal: Skin Care Protocol Initiated - if Braden Score 18 or less If consults are not indicated, leave blank or document N/A  Outcome: Completed/Met Date Met:  02/11/14 Goal: Tobacco Cessation referral if indicated Outcome: Progressing Goal: Nutrition Consult-if indicated Outcome: Not Applicable Date Met:  35/24/81 Goal: Diabetes Guidelines if Diabetic/Glucose > 140 If diabetic or lab glucose is > 140 mg/dl - Initiate Diabetes/Hyperglycemia Guidelines & Document Interventions  Outcome: Not Applicable Date Met:  85/90/93

## 2014-02-11 NOTE — Progress Notes (Signed)
Rt note:  Bipap is a PRN order and pt is in no distress at this time.  Rt will continue to monitor.

## 2014-02-11 NOTE — Progress Notes (Signed)
Pt Temp 101.4 Oral. Pt c/o of being cold. On call MD paged. Dr. Murvin Natal ordered PRN tylenol. Will continue to monitor and assess pt's condition.

## 2014-02-11 NOTE — Consult Note (Signed)
[facility] Medical Consultation  [name] [id number] DOB: [birth year] DOA: 02/09/2014 PCP: [name], MD   Requesting physician: Dr. Marlou Porch Date of consultation: 02/11/2014 Reason for consultation: Fever/wheezing  Impression/Recommendations Principal Problem:   Hypertensive urgency Active Problems:   Fever   Coronary artery disease   Essential hypertension   Hyperlipidemia   Pulmonary edema   Acute renal failure   Demand ischemia   COPD (chronic obstructive pulmonary disease)   Acute pulmonary edema  #1 fever Questionable etiology. Patient noted to have a temperature 101.4 which improved on Tylenol. Will check a UA with cultures and sensitivities. Check blood cultures 2. Check a CBC with differential. Check a chest x-ray. Follow. It also for infection is noted patient will be placed appropriately on IV antibiotics. Follow.  #2 wheezing Likely secondary to cardiac wheezing. Patient however does have a history of COPD per chest x-ray with ongoing tobacco abuse. Will check ABG. Check a chest x-ray. Check a CBC with differential. Continue diuresis. Will place patient on scheduled nebulizer treatments of DuoNeb nebs. Will change Flovent to Symbicort twice daily. Will discontinue Spiriva for now as patient will be placed on scheduled DuoNeb nebs and recommend patient be discharged on a Spiriva inhaler. Tobacco cessation. Follow.  #3 hypertensive urgency Improved on IV nitroglycerin. Continue current regimen of Norvasc, Coreg, Lasix, hydralazine, Imdur per cardiology.  #4 acute on chronic diastolic heart failure Clinical improvement. Patient with good diuresis on current diuretics. Patient is currently -5.2 L during this hospitalization. Continue Coreg, hydralazine, Imdur, Lasix. Per cardiology.  #5 demand ischemia Patient had presented with chest pain. Per cardiology notes patient does not want to seem to proceed with a catheterization at this time. Continue  medical management. Per cardiology.  #6 acute renal failure Likely secondary to problem #4. Improved with diuresis. Per primary team.  #7 COPD Patient with ongoing tobacco abuse. Will place patient on scheduled DuoNeb's. Place patient on Symbicort. On discharge patient will likely need to be discharged on Spiriva inhaler as well. Tobacco cessation.  #8 coronary artery disease Continue current medical management. Per cardiology.  I will followup again tomorrow. Please contact me if I can be of assistance in the meanwhile. Thank you for this consultation.  Chief Complaint: Shortness of breath/chest pain  HPI:  [name] is a pleasant 80 year old African-American gentleman with history of STEMI status post PCI with bare-metal stent to RCA in 9/ 2008, history of anemia, ongoing tobacco abuse, history of cecal cancer status post right hemicolectomy 02/06/2007, history of prostate adenocarcinoma status post intensity modulated radiotherapy, hyperlipidemia, hypertension who was admitted to the cardiology service on 02/09/2014 with chest pain or shortness of breath. Patient was noted to be in pulmonary edema and responded to the BiPAP and diuresis. Patient was also noted to be in acute on chronic renal failure with improvement with diuresis. Patient also noted to be in hypertensive urgency which improved on IV nitroglycerin and antihypertensive medications. Patient denies any fevers prior to admission no cough no weakness no melena no hematemesis no hematochezia, no abdominal pain, no diarrhea, no constipation, no dysuria, no nausea, no vomiting. Patient does endorse some wheezing prior to admission. Last night patient was noted to have a temperature of 101.4. Patient also noted to have some wheezing on examination. [facility] was consulted for further evaluation and management.  Review of Systems:  As per HPI otherwise negative.  Past Medical History  Diagnosis Date  . Hypertension   .  Anemia   . Coronary artery  disease 11/2006    hx STEMI s/p PCI with BMS to RCA  . Cancer 01/2007    hx Cecal CA s/p R hemicolectomy sec adenocarcinoma colon 02/06/07  . Prostate cancer     s/p intensity modulated radiation therapy   Past Surgical History  Procedure Laterality Date  . Cholecystectomy  02/06/07  . Hemicolectomy Right 02/06/07    secondary to adenocarcinoma right colon   Social History:  reports that he has been smoking.  He does not have any smokeless tobacco history on file. He reports that he does not drink alcohol or use illicit drugs.  No Known Allergies Family History  Problem Relation Age of Onset  . Stroke Mother     Prior to Admission medications   Medication Sig Start Date End Date Taking? Authorizing Provider  amLODipine (NORVASC) 10 MG tablet Take 10 mg by mouth daily.   Yes Historical Provider, MD  aspirin 81 MG tablet Take 81 mg by mouth daily.   Yes Historical Provider, MD  atorvastatin (LIPITOR) 40 MG tablet Take 1 tablet (40 mg total) by mouth daily. 01/29/14  Yes Belva Crome III, MD  IRON PO Take 1 tablet by mouth at bedtime.   Yes Historical Provider, MD  isosorbide mononitrate (IMDUR) 60 MG 24 hr tablet Take 1 tablet (60 mg total) by mouth daily. 01/29/14  Yes Belva Crome III, MD  lisinopril (PRINIVIL,ZESTRIL) 40 MG tablet Take 40 mg by mouth daily.   Yes Historical Provider, MD  metoprolol tartrate (LOPRESSOR) 25 MG tablet Take 0.5 tablets (12.5 mg total) by mouth 2 (two) times daily. 01/29/14  Yes Belva Crome III, MD  nitroGLYCERIN (NITROSTAT) 0.4 MG SL tablet Place 1 tablet (0.4 mg total) under the tongue every 5 (five) minutes as needed. For chest pain 01/29/14  Yes Belva Crome III, MD  traMADol (ULTRAM) 50 MG tablet Take 1 tablet (50 mg total) by mouth every 6 (six) hours as needed. 05/30/13  Yes Noland Fordyce, PA-C   Physical Exam: Blood pressure 165/71, pulse 86, temperature 97.9 F (36.6 C), temperature source Oral, resp. rate 17,  height 5\' 8"  (1.727 m), weight 55.4 kg (122 lb 2.2 oz), SpO2 96 %. Filed Vitals:   02/11/14 1010  BP: 165/71  Pulse: 86  Temp:   Resp: 17     General:  Well-developed well-nourished laying in bed in no acute cardiopulmonary distress. Speaking in full sentences.  Eyes: Pupils equal round and reactive to light and accommodation. Extra movements intact. Muddy sclera.  ENT: Oropharynx is clear, no lesions, no exudates.  Neck: Supple with no lymphadenopathy. Positive JVD.  Cardiovascular: Regular rate rhythm no murmurs rubs or gallops.  Respiratory: Bibasilar crackles. Minimal to mild expiratory wheezing. No coarse breath sounds.  Abdomen: Soft, nontender, nondistended, positive bowel sounds.  Skin: No rashes, no lesions.  Musculoskeletal: 5/5 bilateral upper extremity strength. 5/5 bilateral lower extremity strength.  Psychiatric: Normal mood. Normal affect. Fair insight. Fair judgment.  Neurologic: Alert and oriented 3. Cranial no stool through 12 are grossly intact. No focal deficits.  Labs on Admission:  Basic Metabolic Panel:  Recent Labs Lab 02/09/14 0702 02/10/14 0148 02/11/14 0333  NA 141 137 135*  K 4.3 3.8 3.6*  CL 104 97 96  CO2 19 25 25   GLUCOSE 215* 103* 94  BUN 27* 25* 27*  CREATININE 1.94* 1.70* 1.81*  CALCIUM 9.9 9.8 10.3   Liver Function Tests: No results for input(s): AST, ALT, ALKPHOS, BILITOT, PROT, ALBUMIN in  the last 168 hours. No results for input(s): LIPASE, AMYLASE in the last 168 hours. No results for input(s): AMMONIA in the last 168 hours. CBC:  Recent Labs Lab 02/09/14 0702  WBC 9.9  NEUTROABS 4.1  HGB 12.3*  HCT 38.0*  MCV 89.8  PLT 143*   Cardiac Enzymes:  Recent Labs Lab 02/09/14 0702 02/09/14 1225 02/09/14 1937 02/10/14 0148  TROPONINI <0.30 0.49* 0.42* 0.34*   BNP: Invalid input(s): POCBNP CBG: No results for input(s): GLUCAP in the last 168 hours.  Radiological Exams on Admission: No results found.  EKG:  Independently reviewed. Sinus tachycardia with LVH  Time spent: 72 mins  [facility] MD [facility] Pager [phone]  If 7PM-7AM, please contact night-coverage [email or web address] Password [email or web address] 02/11/2014, 11:33 AM

## 2014-02-12 ENCOUNTER — Encounter (HOSPITAL_COMMUNITY)
Admission: EM | Disposition: A | Discharge status: Home / Self Care | Payer: Self-pay | Source: Home / Self Care | Attending: Cardiology

## 2014-02-12 DIAGNOSIS — I2510 Atherosclerotic heart disease of native coronary artery without angina pectoris: Secondary | ICD-10-CM

## 2014-02-12 DIAGNOSIS — I214 Non-ST elevation (NSTEMI) myocardial infarction: Secondary | ICD-10-CM | POA: Diagnosis present

## 2014-02-12 HISTORY — PX: LEFT HEART CATHETERIZATION WITH CORONARY ANGIOGRAM: SHX5451

## 2014-02-12 LAB — BASIC METABOLIC PANEL
Anion gap: 14 (ref 5–15)
BUN: 32 mg/dL — AB (ref 6–23)
CALCIUM: 10.2 mg/dL (ref 8.4–10.5)
CO2: 26 meq/L (ref 19–32)
Chloride: 95 mEq/L — ABNORMAL LOW (ref 96–112)
Creatinine, Ser: 1.76 mg/dL — ABNORMAL HIGH (ref 0.50–1.35)
GFR calc Af Amer: 40 mL/min — ABNORMAL LOW (ref >90)
GFR calc non Af Amer: 35 mL/min — ABNORMAL LOW (ref >90)
GLUCOSE: 98 mg/dL (ref 70–99)
Potassium: 4.4 mEq/L (ref 3.7–5.3)
SODIUM: 135 meq/L — AB (ref 137–147)

## 2014-02-12 LAB — GLUCOSE, CAPILLARY: GLUCOSE-CAPILLARY: 110 mg/dL — AB (ref 70–99)

## 2014-02-12 LAB — URINE CULTURE
Colony Count: NO GROWTH
Culture: NO GROWTH

## 2014-02-12 SURGERY — LEFT HEART CATHETERIZATION WITH CORONARY ANGIOGRAM
Anesthesia: LOCAL

## 2014-02-12 MED ORDER — FENTANYL CITRATE 0.05 MG/ML IJ SOLN
INTRAMUSCULAR | Status: AC
  Filled 2014-02-12: qty 2

## 2014-02-12 MED ORDER — SODIUM CHLORIDE 0.9 % IV SOLN: INTRAVENOUS | Status: DC

## 2014-02-12 MED ORDER — SODIUM CHLORIDE 0.9 % IV SOLN
INTRAVENOUS | Status: DC
Start: 2014-02-12 — End: 2014-02-13

## 2014-02-12 MED ORDER — HEPARIN SODIUM (PORCINE) 1000 UNIT/ML IJ SOLN
INTRAMUSCULAR | Status: AC
  Filled 2014-02-12: qty 1

## 2014-02-12 MED ORDER — NITROGLYCERIN 1 MG/10 ML FOR IR/CATH LAB
INTRA_ARTERIAL | Status: AC
  Filled 2014-02-12: qty 10

## 2014-02-12 MED ORDER — SODIUM CHLORIDE 0.9 % IJ SOLN: 3.0000 mL | INTRAMUSCULAR | Status: DC | PRN

## 2014-02-12 MED ORDER — VERAPAMIL HCL 2.5 MG/ML IV SOLN
INTRAVENOUS | Status: AC
  Filled 2014-02-12: qty 2

## 2014-02-12 MED ORDER — MIDAZOLAM HCL 2 MG/2ML IJ SOLN
INTRAMUSCULAR | Status: AC
  Filled 2014-02-12: qty 2

## 2014-02-12 MED ORDER — LIDOCAINE HCL (PF) 1 % IJ SOLN
INTRAMUSCULAR | Status: AC
  Filled 2014-02-12: qty 30

## 2014-02-12 MED ORDER — HEPARIN (PORCINE) IN NACL 2-0.9 UNIT/ML-% IJ SOLN
INTRAMUSCULAR | Status: AC
  Filled 2014-02-12: qty 1000

## 2014-02-12 MED ORDER — SODIUM CHLORIDE 0.9 % IV SOLN: 250.0000 mL | INTRAVENOUS | Status: DC | PRN

## 2014-02-12 MED ORDER — ACETAMINOPHEN 325 MG PO TABS: 650.0000 mg | ORAL_TABLET | Freq: Four times a day (QID) | ORAL | Status: DC | PRN

## 2014-02-12 MED ORDER — ASPIRIN 81 MG PO CHEW: 81.0000 mg | CHEWABLE_TABLET | ORAL | Status: DC

## 2014-02-12 MED ORDER — SODIUM CHLORIDE 0.9 % IJ SOLN: 3.0000 mL | Freq: Two times a day (BID) | INTRAMUSCULAR | Status: DC

## 2014-02-12 NOTE — Significant Event (Signed)
1635pm-Accepted patient from Cath lab into room 2H18. Patient awake, alert. Arrived with TR band on right wrist (16cc of air in it per report from cath lab RN), NS infusing at 125cc/hour via right FA PIV. No belongings arrived with patient, no family members in unit. Placed on monitors.   RN called patient's spouse who is at home sick and will not be visiting patient today.

## 2014-02-12 NOTE — Care Management Note (Signed)
    Page 1 of 1   02/12/2014     9:38:09 AM CARE MANAGEMENT NOTE 02/12/2014  Patient:  Flegel,Endrit   Account Number:  [id number]  Date Initiated:  02/12/2014  Documentation initiated by:  Elissa Hefty  Subjective/Objective Assessment:   adm w htn urgency     Action/Plan:   lives w wife, pcp dr Darlin Coco   Anticipated DC Date:  02/15/2014   Anticipated DC Plan:  [place]  CM consult      Choice offered to / List presented to:             Status of service:   Medicare Important Message given?  YES (If response is "NO", the following Medicare IM given date fields will be blank) Date Medicare IM given:  02/12/2014 Medicare IM given by:  Elissa Hefty Date Additional Medicare IM given:   Additional Medicare IM given by:    Discharge Disposition:    Per UR Regulation:  Reviewed for med. necessity/level of care/duration of stay  If discussed at [place] of Stay Meetings, dates discussed:    Comments:  12/7 0937 debbie Paulmichael Schreck rn,bsn will moniter for dc needs as pt progresses.

## 2014-02-12 NOTE — Progress Notes (Addendum)
       Patient Name: [name] Date of Encounter: 02/12/2014    SUBJECTIVE: Well known to me. He presented with severe hypertension in setting of dyspnea and chest tightness. Had been having chest tightness for greater than a month when seen as OP. I tried to schedule cath but he refused. He now consents to finding out what is wrong.  TELEMETRY:  NSR/SB Filed Vitals:   02/12/14 0700 02/12/14 0800 02/12/14 0802 02/12/14 0900  BP: 159/67 165/83  151/56  Pulse: 91 93  81  Temp:  98.5 F (36.9 C)    TempSrc:  Oral    Resp: 18 21  21   Height:      Weight:      SpO2: 95% 93% 97% 92%    Intake/Output Summary (Last 24 hours) at 02/12/14 1057 Last data filed at 02/12/14 0900  Gross per 24 hour  Intake    480 ml  Output    555 ml  Net    -75 ml   LABS: Basic Metabolic Panel:  Recent Labs  02/11/14 0333 02/12/14 0255  NA 135* 135*  K 3.6* 4.4  CL 96 95*  CO2 25 26  GLUCOSE 94 98  BUN 27* 32*  CREATININE 1.81* 1.76*  CALCIUM 10.3 10.2   CBC:  Recent Labs  02/11/14 1346  WBC 4.8  NEUTROABS 3.5  HGB 12.9*  HCT 38.3*  MCV 88.0  PLT 129*   Cardiac Enzymes:  Recent Labs  02/09/14 1225 02/09/14 1937 02/10/14 0148  TROPONINI 0.49* 0.42* 0.34*   Radiology/Studies:  None  Physical Exam: Blood pressure 151/56, pulse 81, temperature 98.5 F (36.9 C), temperature source Oral, resp. rate 21, height 5\' 8"  (1.727 m), weight 123 lb 3.8 oz (55.9 kg), SpO2 92 %. Weight change: 1 lb 1.6 oz (0.5 kg)  Wt Readings from Last 3 Encounters:  02/12/14 123 lb 3.8 oz (55.9 kg)  01/29/14 139 lb (63.05 kg)  05/30/13 144 lb (65.318 kg)    Flat neck veins Chest with decreased breath sounds S4 gallop with soft apical systolic murmur. No edema Neuro is normal.  ASSESSMENT:  1. CAD with sudden heart failure. Cause is unclear but we need to exclude progression to 3 vessel CAD vs Renal artery stenosis. 2. Acute on chronic kidney disease, stage  3. Severe hypertension, r/o  RAS 4. Acute diastolic HF  Plan:  1. Coronary angio today with cors only and possible PCI if clinically possible. Procedure and risk of stroke, death, MI, bleeding, allergy, kidney failure, limb ischemia discussed in detail. 2. Need assessment for RAS. ? Abdominal aortogram today if feasible with contrast limit. 3. Hydrate until cath. 4. Watch for worsening heart failure.  [name] 02/12/2014, 10:57 AM

## 2014-02-12 NOTE — Consult Note (Signed)
[facility] TEAM 1 - Stepdown/ICU TEAM Consult F/U Note   [name] [id number] DOB: 1930-03-26 DOA: 02/09/2014 PCP: [name], MD  Admit HPI / Brief Narrative: 80 year old gentleman with history of STEMI status post PCI with bare-metal stent to RCA 11/2006, anemia, ongoing tobacco abuse, cecal cancer status post right hemicolectomy 02/06/2007, prostate adenocarcinoma status post intensity modulated radiotherapy, hyperlipidemia, and hypertension who was admitted to the Cardiology Service on 02/09/2014 with chest pain and shortness of breath. Patient was noted to be in pulmonary edema and responded to the BiPAP and diuresis. Patient was also noted to be in acute on chronic renal failure with improvement with diuresis, as well as hypertensive urgency which improved on IV nitroglycerin and antihypertensive medications.  Patient denied any fevers prior to admission.  The night following his admission the pt was noted to have a temperature of 101.4. Patient also noted to have some wheezing on examination. [facility] was consulted for further evaluation and management.  HPI/Subjective: No complaints this morning.  Tells me he is ready to go home.  Denies chills, sob, or abdom pain.    Assessment/Plan:  FUO No more fever - UA unrevealing - blood cx pending - CXR w/o focal infiltrate - treating for acute bronchitis in setting of acute COPD exac w/o abx at this time   Wheezing / COPD w/ acute bronchospastic exacerbation  does have a history of COPD with ongoing tobacco abuse - on scheduled nebulizer treatments - appears to be stabilizing - possible component of cardiac wheeze due to CHF exac  Hypertensive urgency Improved on IV nitroglycerin - tx per Cardiology  Acute on chronic diastolic heart failure Improved - tx per Cardiology   Demand ischemia / elevated troponin Per Cardiology   Acute renal failure on CKD stage 3 crt holding steady at ~1.8 - baseline crt reportedly  ~1.2  Coronary artery disease Continue current medical management per Cardiology  Code Status: FULL Family Communication: no family present at time of exam  Antibiotics: none  DVT prophylaxis: SCDs  Objective: Blood pressure 151/56, pulse 81, temperature 98.5 F (36.9 C), temperature source Oral, resp. rate 21, height 5\' 8"  (1.727 m), weight 55.9 kg (123 lb 3.8 oz), SpO2 92 %.  Intake/Output Summary (Last 24 hours) at 02/12/14 0949 Last data filed at 02/12/14 0900  Gross per 24 hour  Intake    480 ml  Output    555 ml  Net    -75 ml   Exam: General: No acute respiratory distress at rest in bed Lungs: Clear to auscultation bilaterally without wheezes or crackles Cardiovascular: Regular rate and rhythm without murmur gallop or rub  Abdomen: Nontender, nondistended, soft, bowel sounds positive, no rebound, no ascites, no appreciable mass Extremities: No significant cyanosis, clubbing, or edema bilateral lower extremities  Data Reviewed: Basic Metabolic Panel:  Recent Labs Lab 02/09/14 0702 02/10/14 0148 02/11/14 0333 02/12/14 0255  NA 141 137 135* 135*  K 4.3 3.8 3.6* 4.4  CL 104 97 96 95*  CO2 19 25 25 26   GLUCOSE 215* 103* 94 98  BUN 27* 25* 27* 32*  CREATININE 1.94* 1.70* 1.81* 1.76*  CALCIUM 9.9 9.8 10.3 10.2    Liver Function Tests: No results for input(s): AST, ALT, ALKPHOS, BILITOT, PROT, ALBUMIN in the last 168 hours. No results for input(s): LIPASE, AMYLASE in the last 168 hours. No results for input(s): AMMONIA in the last 168 hours.  Coags:  Recent Labs Lab 02/09/14 0702  INR 1.18   CBC:  Recent Labs Lab 02/09/14 0702 02/11/14 1346  WBC 9.9 4.8  NEUTROABS 4.1 3.5  HGB 12.3* 12.9*  HCT 38.0* 38.3*  MCV 89.8 88.0  PLT 143* 129*    Cardiac Enzymes:  Recent Labs Lab 02/09/14 0702 02/09/14 1225 02/09/14 1937 02/10/14 0148  TROPONINI <0.30 0.49* 0.42* 0.34*   BNP (last 3 results)  Recent Labs  02/09/14 0702 02/10/14 0148    PROBNP 17006.0* [id number]*    Recent Results (from the past 240 hour(s))  MRSA PCR Screening     Status: None   Collection Time: 02/09/14 11:29 AM  Result Value Ref Range Status   MRSA by PCR NEGATIVE NEGATIVE Final    Comment:        The GeneXpert MRSA Assay (FDA approved for NASAL specimens only), is one component of a comprehensive MRSA colonization surveillance program. It is not intended to diagnose MRSA infection nor to guide or monitor treatment for MRSA infections.      Studies:  Recent x-ray studies have been reviewed in detail by the Attending Physician  Scheduled Meds:  Scheduled Meds: . amLODipine  10 mg Oral Daily  . atorvastatin  40 mg Oral QPM  . budesonide-formoterol  2 puff Inhalation BID  . carvedilol  12.5 mg Oral BID WC  . furosemide  20 mg Oral Daily  . hydrALAZINE  50 mg Oral Q8H  . ipratropium-albuterol  3 mL Nebulization BID  . isosorbide mononitrate  60 mg Oral Daily  . sodium chloride  3 mL Intravenous Q12H  . tiotropium  18 mcg Inhalation Daily    Time spent on care of this patient: 35 mins   MCCLUNG,JEFFREY T , MD   [facility] Office  [phone] Pager - Text Page per Shea Evans as per below:  On-Call/Text Page:      [email or web address]      password [email or web address]  If 7PM-7AM, please contact night-coverage [email or web address] Password [email or web address] 02/12/2014, 9:49 AM   LOS: 3 days

## 2014-02-12 NOTE — CV Procedure (Addendum)
Cardiac Catheterization Operative Report  [name] [id number] 12/7/20154:09 PM Glen Hey, MD  Procedure Performed:  1. Left Heart Catheterization 2. Selective Coronary Angiography  Operator: Lauree Chandler, MD  Arterial access site:  Right radial artery.   Indication:  80 yo male with history of CAD s/p PCI RCA in 2008 admitted with chest pain and ruled in for MI with elevated troponin.                                      Procedure Details: The risks, benefits, complications, treatment options, and expected outcomes were discussed with the patient. The patient and/or family concurred with the proposed plan, giving informed consent. The patient was brought to the cath lab after IV hydration was begun and oral premedication was given. The patient was further sedated with Versed and Fentanyl. The right wrist was assessed with a modified Allens test which was positive. The right wrist was prepped and draped in a sterile fashion. 1% lidocaine was used for local anesthesia. Using the modified Seldinger access technique, a 5 French sheath was placed in the right radial artery. 3 mg Verapamil was given through the sheath. 3000 units IV heparin was given. Standard diagnostic catheters were used to perform selective coronary angiography. LV pressures measured with JR4 catheter. I attempted to pass the catheter down the descending aorta to perform non-selective renal artery angiography but there was spasm in the right radial artery and the catheter would not advance. The sheath was removed from the right radial artery and a hemostasis band was applied at the arteriotomy site on the right wrist.   There were no immediate complications. The patient was taken to the recovery area in stable condition.   Hemodynamic Findings: Central aortic pressure: 112/54 Left ventricular pressure: 122/0/13  Angiographic Findings:  Left main: No obstructive disease.   Left Anterior  Descending Artery: Large caliber vessel that courses to the apex with no obstructive disease in the proximal and mid segments. The distal vessel becomes small in caliber. There is a 90% stenosis in the apical LAD, too small for PCI. Small to moderate caliber diagonal branch with proximal 80% stenosis followed by 50% stenosis (2.0 mm vessel).   Circumflex Artery: Large caliber vessel with large bifurcating obtuse marginal branch. There is diffuse 40% stenosis in the proximal segment of both branches of the obtuse marginal.   Right Coronary Artery: Moderate caliber vessel with 30% proximal stenosis, patent mid stent with minimal restenosis, diffuse mild distal plaque.   Left Ventricular Angiogram: Deferred.   Impression: 1. Triple vessel CAD 2. Patent stent mid RCA 3. Diffuse non-obstructive disease in the Circumflex. 4. Moderately severe stenosis in the small to moderate caliber diagonal branch.   Recommendations: I would continue medical management of his CAD. The diagonal branch could be easily approached with PCI but it is not a large vessel (2.0 mm). The diagonal branch is the most likely culprit for his small NSTEMI. The major epicardial vessels are patent. If he fails medical management, could pursue PCI of Diagonal at another time if renal function remains stable. Unable to perform non-selective angiography of the renal arteries due to radial artery spasm after coronary angiography. Suggest renal artery dopplers to exclude renal artery stenosis.        Complications:  None. The patient tolerated the procedure well.

## 2014-02-12 NOTE — Interval H&P Note (Signed)
History and Physical Interval Note:  02/12/2014 3:22 PM  [name]  has presented today for cardiac cath with the diagnosis of chest pain. The various methods of treatment have been discussed with the patient and family. After consideration of risks, benefits and other options for treatment, the patient has consented to  Procedure(s): LEFT HEART CATHETERIZATION WITH CORONARY ANGIOGRAM (N/A) as a surgical intervention .  The patient's history has been reviewed, patient examined, no change in status, stable for surgery.  I have reviewed the patient's chart and labs.  Questions were answered to the patient's satisfaction.    Cath Lab Visit (complete for each Cath Lab visit)  Clinical Evaluation Leading to the Procedure:   ACS:   Non-ACS:  yes  Anginal Classification: CCS IV  Anti-ischemic medical therapy: Maximal Therapy (2 or more classes of medications)  Non-Invasive Test Results: No non-invasive testing performed  Prior CABG: No previous CABG         Christphor Groft

## 2014-02-12 NOTE — H&P (View-Only) (Signed)
       Patient Name: [name] Date of Encounter: 02/12/2014    SUBJECTIVE: Well known to me. He presented with severe hypertension in setting of dyspnea and chest tightness. Had been having chest tightness for greater than a month when seen as OP. I tried to schedule cath but he refused. He now consents to finding out what is wrong.  TELEMETRY:  NSR/SB Filed Vitals:   02/12/14 0700 02/12/14 0800 02/12/14 0802 02/12/14 0900  BP: 159/67 165/83  151/56  Pulse: 91 93  81  Temp:  98.5 F (36.9 C)    TempSrc:  Oral    Resp: 18 21  21   Height:      Weight:      SpO2: 95% 93% 97% 92%    Intake/Output Summary (Last 24 hours) at 02/12/14 1057 Last data filed at 02/12/14 0900  Gross per 24 hour  Intake    480 ml  Output    555 ml  Net    -75 ml   LABS: Basic Metabolic Panel:  Recent Labs  02/11/14 0333 02/12/14 0255  NA 135* 135*  K 3.6* 4.4  CL 96 95*  CO2 25 26  GLUCOSE 94 98  BUN 27* 32*  CREATININE 1.81* 1.76*  CALCIUM 10.3 10.2   CBC:  Recent Labs  02/11/14 1346  WBC 4.8  NEUTROABS 3.5  HGB 12.9*  HCT 38.3*  MCV 88.0  PLT 129*   Cardiac Enzymes:  Recent Labs  02/09/14 1225 02/09/14 1937 02/10/14 0148  TROPONINI 0.49* 0.42* 0.34*   Radiology/Studies:  None  Physical Exam: Blood pressure 151/56, pulse 81, temperature 98.5 F (36.9 C), temperature source Oral, resp. rate 21, height 5\' 8"  (1.727 m), weight 123 lb 3.8 oz (55.9 kg), SpO2 92 %. Weight change: 1 lb 1.6 oz (0.5 kg)  Wt Readings from Last 3 Encounters:  02/12/14 123 lb 3.8 oz (55.9 kg)  01/29/14 139 lb (63.05 kg)  05/30/13 144 lb (65.318 kg)    Flat neck veins Chest with decreased breath sounds S4 gallop with soft apical systolic murmur. No edema Neuro is normal.  ASSESSMENT:  1. CAD with sudden heart failure. Cause is unclear but we need to exclude progression to 3 vessel CAD vs Renal artery stenosis. 2. Acute on chronic kidney disease, stage  3. Severe hypertension, r/o  RAS 4. Acute diastolic HF  Plan:  1. Coronary angio today with cors only and possible PCI if clinically possible. Procedure and risk of stroke, death, MI, bleeding, allergy, kidney failure, limb ischemia discussed in detail. 2. Need assessment for RAS. ? Abdominal aortogram today if feasible with contrast limit. 3. Hydrate until cath. 4. Watch for worsening heart failure.  [name] 02/12/2014, 10:57 AM

## 2014-02-12 NOTE — Progress Notes (Signed)
\  Echocardiogram 2D Echocardiogram has been performed.  [name] 02/12/2014, 1:22 PM

## 2014-02-13 ENCOUNTER — Other Ambulatory Visit: Payer: Self-pay | Admitting: Physician Assistant

## 2014-02-13 ENCOUNTER — Encounter (HOSPITAL_COMMUNITY): Payer: Self-pay | Admitting: Physician Assistant

## 2014-02-13 DIAGNOSIS — R509 Fever, unspecified: Secondary | ICD-10-CM | POA: Insufficient documentation

## 2014-02-13 DIAGNOSIS — J441 Chronic obstructive pulmonary disease with (acute) exacerbation: Secondary | ICD-10-CM

## 2014-02-13 DIAGNOSIS — E785 Hyperlipidemia, unspecified: Secondary | ICD-10-CM

## 2014-02-13 DIAGNOSIS — N179 Acute kidney failure, unspecified: Secondary | ICD-10-CM | POA: Insufficient documentation

## 2014-02-13 DIAGNOSIS — I25119 Atherosclerotic heart disease of native coronary artery with unspecified angina pectoris: Secondary | ICD-10-CM

## 2014-02-13 DIAGNOSIS — I10 Essential (primary) hypertension: Secondary | ICD-10-CM

## 2014-02-13 DIAGNOSIS — I5032 Chronic diastolic (congestive) heart failure: Secondary | ICD-10-CM | POA: Insufficient documentation

## 2014-02-13 DIAGNOSIS — I5043 Acute on chronic combined systolic (congestive) and diastolic (congestive) heart failure: Secondary | ICD-10-CM

## 2014-02-13 DIAGNOSIS — N189 Chronic kidney disease, unspecified: Secondary | ICD-10-CM

## 2014-02-13 DIAGNOSIS — J810 Acute pulmonary edema: Secondary | ICD-10-CM

## 2014-02-13 LAB — COMPREHENSIVE METABOLIC PANEL
ALT: 10 U/L (ref 0–53)
AST: 21 U/L (ref 0–37)
Albumin: 3 g/dL — ABNORMAL LOW (ref 3.5–5.2)
Alkaline Phosphatase: 79 U/L (ref 39–117)
Anion gap: 15 (ref 5–15)
BUN: 34 mg/dL — ABNORMAL HIGH (ref 6–23)
CO2: 21 meq/L (ref 19–32)
CREATININE: 1.65 mg/dL — AB (ref 0.50–1.35)
Calcium: 9.9 mg/dL (ref 8.4–10.5)
Chloride: 99 mEq/L (ref 96–112)
GFR, EST AFRICAN AMERICAN: 43 mL/min — AB (ref >90)
GFR, EST NON AFRICAN AMERICAN: 37 mL/min — AB (ref >90)
Glucose, Bld: 92 mg/dL (ref 70–99)
Potassium: 4 mEq/L (ref 3.7–5.3)
Sodium: 135 mEq/L — ABNORMAL LOW (ref 137–147)
TOTAL PROTEIN: 7.3 g/dL (ref 6.0–8.3)
Total Bilirubin: 0.5 mg/dL (ref 0.3–1.2)

## 2014-02-13 LAB — CBC
HCT: 37 % — ABNORMAL LOW (ref 39.0–52.0)
Hemoglobin: 12.5 g/dL — ABNORMAL LOW (ref 13.0–17.0)
MCH: 29.6 pg (ref 26.0–34.0)
MCHC: 33.8 g/dL (ref 30.0–36.0)
MCV: 87.5 fL (ref 78.0–100.0)
Platelets: 136 10*3/uL — ABNORMAL LOW (ref 150–400)
RBC: 4.23 MIL/uL (ref 4.22–5.81)
RDW: 14.5 % (ref 11.5–15.5)
WBC: 4.6 10*3/uL (ref 4.0–10.5)

## 2014-02-13 LAB — INFLUENZA PANEL BY PCR (TYPE A & B)
H1N1 flu by pcr: NOT DETECTED
INFLBPCR: NEGATIVE
Influenza A By PCR: NEGATIVE

## 2014-02-13 MED ORDER — FUROSEMIDE 20 MG PO TABS: 20.0000 mg | ORAL_TABLET | Freq: Every day | ORAL | Status: DC

## 2014-02-13 MED ORDER — BUDESONIDE-FORMOTEROL FUMARATE 160-4.5 MCG/ACT IN AERO: 2.0000 | INHALATION_SPRAY | Freq: Two times a day (BID) | RESPIRATORY_TRACT | Status: DC

## 2014-02-13 MED ORDER — METOPROLOL TARTRATE 25 MG PO TABS: 25.0000 mg | ORAL_TABLET | Freq: Two times a day (BID) | ORAL | Status: DC

## 2014-02-13 MED ORDER — ASPIRIN 81 MG PO CHEW: 81.0000 mg | CHEWABLE_TABLET | Freq: Every day | ORAL | Status: DC

## 2014-02-13 MED ORDER — HYDRALAZINE HCL 50 MG PO TABS
50.0000 mg | ORAL_TABLET | Freq: Two times a day (BID) | ORAL | Status: DC
  Filled 2014-02-13: qty 1

## 2014-02-13 MED ORDER — HYDRALAZINE HCL 50 MG PO TABS: 50.0000 mg | ORAL_TABLET | Freq: Two times a day (BID) | ORAL | Status: DC

## 2014-02-13 NOTE — Progress Notes (Signed)
CARDIAC REHAB PHASE I   PRE:  Rate/Rhythm: 94 SR  BP:  Supine: 109/83  Sitting:   Standing:    SaO2: 95%RA  MODE:  Ambulation: 390 ft   POST:  Rate/Rhythm: 98 SR  BP:  Supine:   Sitting: 125/56  Standing:    SaO2: 99%RA 1345-1420 Pt with small amount stool in bed. Cleaned pt's bottom before we walked. Told pt he needed to get bath. Stated he would get one at home. Walked 390 ft on RA with rolling walker and asst x 1. Gait fairly steady. Denied CP or SOB. Stated he has walker and cane at home to use. Left CHF booklet and low sodium diet for family to read as pt will need re enforcement with ed. Encouraged him not to use salt shaker at home. Gave smoking cessation handout and he stated he had quit and was not smoking anymore. To bed after walk.   Graylon Good, RN BSN  02/13/2014 2:17 PM

## 2014-02-13 NOTE — Discharge Summary (Signed)
Discharge Summary   Patient ID: [name],  MRN: [id number], DOB/AGE: 08/29/1930 80 y.o.  Admit date: 02/09/2014 Discharge date: 02/13/2014  Primary Care Provider: [name] Primary Cardiologist: Dr. Tamala Julian  Discharge Diagnoses Principal Problem:   Hypertensive urgency Active Problems:   Coronary artery disease   Essential hypertension   Hyperlipidemia   Pulmonary edema   Acute renal failure   Demand ischemia   COPD (chronic obstructive pulmonary disease)   Acute pulmonary edema   Fever   Wheezing   Hypertensive emergency   NSTEMI (non-ST elevated myocardial infarction)   Allergies No Known Allergies  Procedures  Echocardiogram 02/09/2014             ------------------------------------------------------------------- LV EF: 45% -  50%  ------------------------------------------------------------------- Indications:   CHF - 428.0.  ------------------------------------------------------------------- History:  PMH:  Coronary artery disease. Risk factors: Current tobacco use. Hypertension.  ------------------------------------------------------------------- Study Conclusions  - Left ventricle: Wall thickness was increased in a pattern of moderate LVH. Systolic function was mildly reduced. The estimated ejection fraction was in the range of 45% to 50%. Diffuse hypokinesis. There is moderate hypokinesis of the inferior myocardium. Doppler parameters are consistent with abnormal left ventricular relaxation (grade 1 diastolic dysfunction). Doppler parameters are consistent with high ventricular filling pressure. - Aortic valve: There was moderate regurgitation. - Pericardium, extracardiac: A trivial pericardial effusion was identified.  Impressions:  - Myocardium has starry sky appearance raising possibility of amyloidosis. LV strain pattern shows preservation of apical systolic function. Consider cardiac MRI if  clinically indicated.         Cardiac catheterization 02/12/2014  Procedure Performed:  1. Left Heart Catheterization 2. Selective Coronary Angiography 3.  Hemodynamic Findings: Central aortic pressure: 112/54 Left ventricular pressure: 122/0/13  Angiographic Findings:  Left main: No obstructive disease.   Left Anterior Descending Artery: Large caliber vessel that courses to the apex with no obstructive disease in the proximal and mid segments. The distal vessel becomes small in caliber. There is a 90% stenosis in the apical LAD, too small for PCI. Small to moderate caliber diagonal branch with proximal 80% stenosis followed by 50% stenosis (2.0 mm vessel).   Circumflex Artery: Large caliber vessel with large bifurcating obtuse marginal branch. There is diffuse 40% stenosis in the proximal segment of both branches of the obtuse marginal.   Right Coronary Artery: Moderate caliber vessel with 30% proximal stenosis, patent mid stent with minimal restenosis, diffuse mild distal plaque.   Left Ventricular Angiogram: Deferred.   Impression: 1. Triple vessel CAD 2. Patent stent mid RCA 3. Diffuse non-obstructive disease in the Circumflex. 4. Moderately severe stenosis in the small to moderate caliber diagonal branch.   Recommendations: I would continue medical management of his CAD. The diagonal branch could be easily approached with PCI but it is not a large vessel (2.0 mm). The diagonal branch is the most likely culprit for his small NSTEMI. The major epicardial vessels are patent. If he fails medical management, could pursue PCI of Diagonal at another time if renal function remains stable. Unable to perform non-selective angiography of the renal arteries due to radial artery spasm after coronary angiography. Suggest renal artery dopplers to exclude renal artery stenosis.    Complications: None. The patient tolerated the procedure well.       Hospital  Course  The patient is a 80 year old male with history of coronary artery disease status post PCI in 2012, HTN, HLD, history of anemia, history of prostate and cecal cancer. Patient was recently seen by  his primary cardiologist Dr. Tamala Julian on 01/29/2014 when he complained of increasing exertional dyspnea and angina with walking for the past 2 month. He was placed on Imdur 60 mg daily. Dr. Tamala Julian discussed with the patient regarding potential possibility of coronary angiography to define anatomy and help guide further therapy. Risk and benefit of the procedure was discussed with the patient, however patient was uncertain about whether to proceed at the time. He was placed on medical therapy and plan for follow-up in 30 days.  Patient came in to [facility] ED on 02/09/2014 with chest pain and pulmonary edema. On arrival, his systolic blood pressure was over 200. Pro BNP on arrival was markedly elevated at 17000. Serum creatinine was 1.9 whereas his baseline was around 1.2. He was placed on BiPAP and IV Lasix. IV nitroglycerin was started in the ED for malignant hypertension. Initial troponin was negative, however serial troponin went up to 0.49 before coming down. Echocardiogram was obtained on the day of arrival which showed EF 45-50%, diffuse hypokinesis, moderate hypokinesis of the inferior myocardium, grade 1 diastolic dysfunction, moderate AR. Of note, his previous EF was 60% on cath in 2012. ProBNP on the following day increased to 28,000. Serum creatinine showed mild improvement with diuresis. His IV nitroglycerin was weaned off on 02/10/2014. On the night of 12/5, patient had an episode of fever with MAXIMUM TEMPERATURE 101.4. On exam he had some wheezing as well. Internal medicine service was consulted for wheezing and fever. Urinalysis with culture and sensitivity and blood culture was obtained. Urinalysis had rare bacteria and negative nitrite. Patient was treated for bronchitis in the setting of  COPD exacerbation without antibiotic. He was initially resistant to cardiac catheterization, however eventually agreed to a cardiac cath. He underwent a scheduled cardiac catheterization on 02/12/2014 which showed largely nonobstructive triple-vessel disease, patent stent in mid RCA, diffuse nonobstructive disease in left circumflex, moderately severe stenosis in small to moderate diagonal branch. Medical therapy was recommended. Although diagonal branch could be treated with PCI, however it is not a large vessel. It was initially planned to do renal artery angiography during the procedure, during cardiac catheterization, he had some radial artery spasm, therefore renal artery angiography was not able to be done. Renal artery Doppler was recommended to rule out bilateral renal artery stenosis in the setting of recent hypertensive urgency and flash pulmonary edema.  Patient was seen in the morning of 12/80/2015 at which time she was doing well and denies significant dyspnea. His previously lisinopril has been discontinued given acute renal insufficiency during this admission, and the possibility of bilateral renal artery stenosis. We will obtain renal Doppler study as outpatient for evaluation of his sudden malignant hypertension. Hydralazine was added to his medical regimen along with amlodipine, Imdur and metoprolol. He ambulated with cardiac rehab without significant discomfort or SOB. He is deemed stable for discharge from cardiology perspective.    Discharge Vitals Blood pressure 94/41, pulse 72, temperature 99.2 F (37.3 C), temperature source Oral, resp. rate 23, height 5\' 8"  (1.727 m), weight 119 lb 11.4 oz (54.3 kg), SpO2 95 %.  Filed Weights   02/11/14 0500 02/12/14 0600 02/13/14 0300  Weight: 122 lb 2.2 oz (55.4 kg) 123 lb 3.8 oz (55.9 kg) 119 lb 11.4 oz (54.3 kg)    Labs  CBC  Recent Labs  02/11/14 1346 02/13/14 0043  WBC 4.8 4.6  NEUTROABS 3.5  --   HGB 12.9* 12.5*  HCT 38.3*  37.0*  MCV 88.0 87.5  PLT 129* [id number]*   Basic Metabolic Panel  Recent Labs  02/12/14 0255 02/13/14 0043  NA 135* 135*  K 4.4 4.0  CL 95* 99  CO2 26 21  GLUCOSE 98 92  BUN 32* 34*  CREATININE 1.76* 1.65*  CALCIUM 10.2 9.9   Liver Function Tests  Recent Labs  02/13/14 0043  AST 21  ALT 10  ALKPHOS 79  BILITOT 0.5  PROT 7.3  ALBUMIN 3.0*    Disposition  Pt is being discharged home today in good condition.  Follow-up Plans & Appointments      Follow-up Information    Follow up with Sinclair Grooms, MD On 02/21/2014.   Specialty:  Cardiology   Why:  9:00am   Contact information:   [id number] [place] [id number] [phone]       Follow up with [facility] On 02/16/2014.   Specialty:  Cardiology   Why:  9:30am   Contact information:   [address], [place] [phone]      Discharge Medications    Medication List    STOP taking these medications        lisinopril 40 MG tablet  Commonly known as:  PRINIVIL,ZESTRIL      TAKE these medications        amLODipine 10 MG tablet  Commonly known as:  NORVASC  Take 10 mg by mouth daily.     aspirin 81 MG tablet  Take 81 mg by mouth daily.     atorvastatin 40 MG tablet  Commonly known as:  LIPITOR  Take 1 tablet (40 mg total) by mouth daily.     budesonide-formoterol 160-4.5 MCG/ACT inhaler  Commonly known as:  SYMBICORT  Inhale 2 puffs into the lungs 2 (two) times daily.     furosemide 20 MG tablet  Commonly known as:  LASIX  Take 1 tablet (20 mg total) by mouth daily.     hydrALAZINE 50 MG tablet  Commonly known as:  APRESOLINE  Take 1 tablet (50 mg total) by mouth 2 (two) times daily.     IRON PO  Take 1 tablet by mouth at bedtime.     isosorbide mononitrate 60 MG 24 hr tablet  Commonly known as:  IMDUR  Take 1 tablet (60 mg total) by mouth daily.     metoprolol tartrate 25 MG tablet  Commonly  known as:  LOPRESSOR  Take 1 tablet (25 mg total) by mouth 2 (two) times daily.     nitroGLYCERIN 0.4 MG SL tablet  Commonly known as:  NITROSTAT  Place 1 tablet (0.4 mg total) under the tongue every 5 (five) minutes as needed. For chest pain     traMADol 50 MG tablet  Commonly known as:  ULTRAM  Take 1 tablet (50 mg total) by mouth every 6 (six) hours as needed.        Outstanding Labs/Studies  Outpatient renal artery ultrasound before followup with Dr. Tamala Julian  Duration of Discharge Encounter   Greater than 30 minutes including physician time.  Hilbert Corrigan PA-C Pager: [phone] 02/13/2014, 3:44 PM

## 2014-02-13 NOTE — Progress Notes (Signed)
Discharge instructions given to pt and granddaughter who verbalized understanding, pt departed floor with all belonging via wheelchair

## 2014-02-13 NOTE — Consult Note (Signed)
[place] TEAM 1 - Stepdown/ICU TEAM Consult F/U Note   [name] [id number] DOB: Nov 10, 1930 DOA: 02/09/2014 PCP: [name], MD  Admit HPI / Brief Narrative: 80 year old gentleman with history of STEMI status post PCI with bare-metal stent to RCA 11/2006, anemia, ongoing tobacco abuse, cecal cancer status post right hemicolectomy 02/06/2007, prostate adenocarcinoma status post intensity modulated radiotherapy, hyperlipidemia, and hypertension who was admitted to the Cardiology Service on 02/09/2014 with chest pain and shortness of breath. Patient was noted to be in pulmonary edema and responded to the BiPAP and diuresis. Patient was also noted to be in acute on chronic renal failure with improvement with diuresis, as well as hypertensive urgency which improved on IV nitroglycerin and antihypertensive medications.  Patient denied any fevers prior to admission.  The night following his admission the pt was noted to have a temperature of 101.4. Patient also noted to have some wheezing on examination. [facility] was consulted for further evaluation and management.  HPI/Subjective: Alert, denies cough, CP or SOB-eager to dc home    Assessment/Plan:  FUO No additional fever - urine cx negative- blood cx pending - CXR w/o focal infiltrate - continue to treat as acute bronchitis in setting of acute COPD exac w/o abx at this time -OK to discharge from a medical standpoint-continue PO Levaquin for a total of 7 days therapy  Wheezing / COPD w/ acute bronchospastic exacerbation  known history of COPD with ongoing tobacco abuse - on scheduled nebulizer treatments - appears to be stabilizing - possible component of cardiac wheeze due to CHF exac  Hypertensive urgency Improved on IV nitroglycerin - tx per Cardiology  Acute on chronic diastolic heart failure Improved - tx per Cardiology   Demand ischemia / elevated troponin Per Cardiology   Acute renal failure on CKD stage 3 crt  holding steady at ~1.8 - baseline crt reportedly ~1.2  Coronary artery disease Continue current medical management per Cardiology-post cath 12/7  Code Status: FULL Family Communication: no family present at time of exam  Antibiotics: none  DVT prophylaxis: SCDs  Objective: Blood pressure 134/56, pulse 84, temperature 99.2 F (37.3 C), temperature source Oral, resp. rate 21, height 5\' 8"  (1.727 m), weight 119 lb 11.4 oz (54.3 kg), SpO2 100 %.  Intake/Output Summary (Last 24 hours) at 02/13/14 1114 Last data filed at 02/13/14 0900  Gross per 24 hour  Intake 1036.25 ml  Output    600 ml  Net 436.25 ml   Exam: General: No acute respiratory distress at rest in bed Lungs: Clear to auscultation bilaterally without rhonchi, wheezes or crackles, good air movement RA Cardiovascular: Regular rate and rhythm without murmur gallop or rub  Abdomen: Nontender, nondistended, soft, bowel sounds positive, no rebound, no ascites, no appreciable mass Extremities: No significant cyanosis, clubbing, or edema bilateral lower extremities  Data Reviewed: Basic Metabolic Panel:  Recent Labs Lab 02/09/14 0702 02/10/14 0148 02/11/14 0333 02/12/14 0255 02/13/14 0043  NA 141 137 135* 135* 135*  K 4.3 3.8 3.6* 4.4 4.0  CL 104 97 96 95* 99  CO2 19 25 25 26 21   GLUCOSE 215* 103* 94 98 92  BUN 27* 25* 27* 32* 34*  CREATININE 1.94* 1.70* 1.81* 1.76* 1.65*  CALCIUM 9.9 9.8 10.3 10.2 9.9    Liver Function Tests:  Recent Labs Lab 02/13/14 0043  AST 21  ALT 10  ALKPHOS 79  BILITOT 0.5  PROT 7.3  ALBUMIN 3.0*   No results for input(s): LIPASE, AMYLASE in the last  168 hours. No results for input(s): AMMONIA in the last 168 hours.  Coags:  Recent Labs Lab 02/09/14 0702  INR 1.18   CBC:  Recent Labs Lab 02/09/14 0702 02/11/14 1346 02/13/14 0043  WBC 9.9 4.8 4.6  NEUTROABS 4.1 3.5  --   HGB 12.3* 12.9* 12.5*  HCT 38.0* 38.3* 37.0*  MCV 89.8 88.0 87.5  PLT 143* 129* 136*     Cardiac Enzymes:  Recent Labs Lab 02/09/14 0702 02/09/14 1225 02/09/14 1937 02/10/14 0148  TROPONINI <0.30 0.49* 0.42* 0.34*   BNP (last 3 results)  Recent Labs  02/09/14 0702 02/10/14 0148  PROBNP 17006.0* [id number]*    Recent Results (from the past 240 hour(s))  MRSA PCR Screening     Status: None   Collection Time: 02/09/14 11:29 AM  Result Value Ref Range Status   MRSA by PCR NEGATIVE NEGATIVE Final    Comment:        The GeneXpert MRSA Assay (FDA approved for NASAL specimens only), is one component of a comprehensive MRSA colonization surveillance program. It is not intended to diagnose MRSA infection nor to guide or monitor treatment for MRSA infections.   Culture, Urine     Status: None   Collection Time: 02/11/14 11:06 AM  Result Value Ref Range Status   Specimen Description URINE, CLEAN CATCH  Final   Special Requests NONE  Final   Culture  Setup Time   Final    02/11/2014 17:57 Performed at [facility] Performed at [facility]   Final   Culture NO GROWTH Performed at [facility]   Final   Report Status 02/12/2014 FINAL  Final  Culture, blood (routine x 2)     Status: None (Preliminary result)   Collection Time: 02/11/14  1:36 PM  Result Value Ref Range Status   Specimen Description BLOOD RIGHT WRIST  Final   Special Requests BOTTLES DRAWN AEROBIC AND ANAEROBIC 5CC  Final   Culture  Setup Time   Final    02/11/2014 17:34 Performed at [facility]    Culture   Final           BLOOD CULTURE RECEIVED NO GROWTH TO DATE CULTURE WILL BE HELD FOR 5 DAYS BEFORE ISSUING A FINAL NEGATIVE REPORT Performed at [facility]    Report Status PENDING  Incomplete  Culture, blood (routine x 2)     Status: None (Preliminary result)   Collection Time: 02/11/14  1:46 PM  Result Value Ref Range Status   Specimen Description BLOOD LEFT WRIST  Final   Special Requests   Final    BOTTLES  DRAWN AEROBIC AND ANAEROBIC 5CC AER,3CC ANA   Culture  Setup Time   Final    02/11/2014 17:34 Performed at [facility]    Culture   Final           BLOOD CULTURE RECEIVED NO GROWTH TO DATE CULTURE WILL BE HELD FOR 5 DAYS BEFORE ISSUING A FINAL NEGATIVE REPORT Performed at [facility]    Report Status PENDING  Incomplete     Studies:  Recent x-ray studies have been reviewed in detail by the Attending Physician  Scheduled Meds:  Scheduled Meds: . amLODipine  10 mg Oral Daily  . aspirin  81 mg Oral Daily  . atorvastatin  40 mg Oral QPM  . budesonide-formoterol  2 puff Inhalation BID  . carvedilol  12.5 mg Oral BID WC  .  furosemide  20 mg Oral Daily  . hydrALAZINE  50 mg Oral Q8H  . isosorbide mononitrate  60 mg Oral Daily  . sodium chloride  3 mL Intravenous Q12H  . tiotropium  18 mcg Inhalation Daily    Time spent on care of this patient: 35 mins   ELLIS,ALLISON L. , ANP   [facility] Office  [phone] Pager - Text Page per Shea Evans as per below:  On-Call/Text Page:      [email or web address]      password [email or web address]  If 7PM-7AM, please contact night-coverage [email or web address] Password [email or web address] 02/13/2014, 11:14 AM   LOS: 4 days  Examined patient and discussed assessment and plan with ANP Ebony Hail and agree with above. Patient with multiple complex medical problems> 35 minutes spent in direct patient care

## 2014-02-13 NOTE — Progress Notes (Signed)
       Patient Name: [name] Date of Encounter: 02/13/2014    SUBJECTIVE: He is doing well. He wants to go home. He denies dyspnea. There is been no recurrent angina. He has been ambulating in the room but not yet in the hall.  TELEMETRY:  Normal sinus rhythm Filed Vitals:   02/13/14 0900 02/13/14 1000 02/13/14 1150 02/13/14 1200  BP: 150/61 134/56  94/41  Pulse: 81 84 72   Temp:   99.2 F (37.3 C)   TempSrc:   Oral   Resp: 19 21 20 23   Height:      Weight:      SpO2: 96% 100% 95%     Intake/Output Summary (Last 24 hours) at 02/13/14 1259 Last data filed at 02/13/14 0900  Gross per 24 hour  Intake 1036.25 ml  Output    600 ml  Net 436.25 ml   LABS: Basic Metabolic Panel:  Recent Labs  02/12/14 0255 02/13/14 0043  NA 135* 135*  K 4.4 4.0  CL 95* 99  CO2 26 21  GLUCOSE 98 92  BUN 32* 34*  CREATININE 1.76* 1.65*  CALCIUM 10.2 9.9   CBC:  Recent Labs  02/11/14 1346 02/13/14 0043  WBC 4.8 4.6  NEUTROABS 3.5  --   HGB 12.9* 12.5*  HCT 38.3* 37.0*  MCV 88.0 87.5  PLT 129* 136*   BNP    Component Value Date/Time   PROBNP 27880.0* 02/10/2014 0148     Radiology/Studies:  Chest x-ray from 02/11/2014 is improved  Physical Exam: Blood pressure 94/41, pulse 72, temperature 99.2 F (37.3 C), temperature source Oral, resp. rate 23, height 5\' 8"  (1.727 m), weight 119 lb 11.4 oz (54.3 kg), SpO2 95 %. Weight change: -3 lb 8.4 oz (-1.6 kg)  Wt Readings from Last 3 Encounters:  02/13/14 119 lb 11.4 oz (54.3 kg)  01/29/14 139 lb (63.05 kg)  05/30/13 144 lb (65.318 kg)    S4 gallop is heard on auscultation No JVD is noted on neck exam Chest is clear Extremities no edema Right radial cath site is unremarkable  ASSESSMENT:  1. Sudden pulmonary edema with extreme blood pressure elevation raises a question of renal artery stenosis or multivessel coronary disease (which is now been excluded by the coronary angiogram performed yesterday). 2. Coronary  artery disease with widely patent RCA stent and diffuse distal disease in the LAD. A small diagonal also has high-grade disease. 3. Acute on chronic diastolic heart failure, improved with diuresis and blood pressure control 4. Chronic kidney disease, stage III-IV with improvement in function with diuresis and stabilization of cardiac status 5. Severe hypertension, better controlled on current medical regimen  Plan:  1. Plan renal Doppler study to exclude renal artery stenosis. May be done as an outpatient if not possible prior to discharge 2. Will discontinue angiotensin-converting enzyme inhibitor 3. Continue diuretic therapy, add hydralazine, continue amlodipine, continue Imdur, and slightly increase metoprolol from 12.5 mg twice a day to 25 mg twice a day. 4. Ambulate with cardiac rehabilitation 5. Home later today if all goes well. If unable to discharge, he could be transferred to telemetry.    [name] 02/13/2014, 12:59 PM

## 2014-02-15 ENCOUNTER — Encounter (HOSPITAL_COMMUNITY): Payer: Self-pay | Admitting: Cardiovascular Disease

## 2014-02-16 ENCOUNTER — Ambulatory Visit (HOSPITAL_COMMUNITY): Payer: PRIVATE HEALTH INSURANCE | Attending: Physician Assistant | Admitting: Cardiology

## 2014-02-16 DIAGNOSIS — E785 Hyperlipidemia, unspecified: Secondary | ICD-10-CM | POA: Diagnosis not present

## 2014-02-16 DIAGNOSIS — J810 Acute pulmonary edema: Secondary | ICD-10-CM

## 2014-02-16 DIAGNOSIS — I700 Atherosclerosis of aorta: Secondary | ICD-10-CM | POA: Diagnosis not present

## 2014-02-16 DIAGNOSIS — J811 Chronic pulmonary edema: Secondary | ICD-10-CM | POA: Diagnosis not present

## 2014-02-16 DIAGNOSIS — F17200 Nicotine dependence, unspecified, uncomplicated: Secondary | ICD-10-CM | POA: Diagnosis not present

## 2014-02-16 DIAGNOSIS — I10 Essential (primary) hypertension: Secondary | ICD-10-CM | POA: Diagnosis present

## 2014-02-16 DIAGNOSIS — I2510 Atherosclerotic heart disease of native coronary artery without angina pectoris: Secondary | ICD-10-CM | POA: Insufficient documentation

## 2014-02-16 DIAGNOSIS — I160 Hypertensive urgency: Secondary | ICD-10-CM

## 2014-02-16 NOTE — Progress Notes (Signed)
Renal artery duplex performed  

## 2014-02-17 LAB — CULTURE, BLOOD (ROUTINE X 2)
CULTURE: NO GROWTH
Culture: NO GROWTH

## 2014-02-21 ENCOUNTER — Encounter: Payer: Self-pay | Admitting: Interventional Cardiology

## 2014-02-21 ENCOUNTER — Ambulatory Visit (INDEPENDENT_AMBULATORY_CARE_PROVIDER_SITE_OTHER): Payer: PRIVATE HEALTH INSURANCE | Admitting: Interventional Cardiology

## 2014-02-21 VITALS — BP 140/66 | HR 61 | Ht 68.0 in | Wt 130.0 lb

## 2014-02-21 DIAGNOSIS — J441 Chronic obstructive pulmonary disease with (acute) exacerbation: Secondary | ICD-10-CM

## 2014-02-21 DIAGNOSIS — I5032 Chronic diastolic (congestive) heart failure: Secondary | ICD-10-CM

## 2014-02-21 DIAGNOSIS — N179 Acute kidney failure, unspecified: Secondary | ICD-10-CM

## 2014-02-21 DIAGNOSIS — I10 Essential (primary) hypertension: Secondary | ICD-10-CM

## 2014-02-21 DIAGNOSIS — I25119 Atherosclerotic heart disease of native coronary artery with unspecified angina pectoris: Secondary | ICD-10-CM

## 2014-02-21 DIAGNOSIS — N189 Chronic kidney disease, unspecified: Secondary | ICD-10-CM

## 2014-02-21 LAB — BASIC METABOLIC PANEL
BUN: 25 mg/dL — ABNORMAL HIGH (ref 6–23)
CO2: 25 meq/L (ref 19–32)
Calcium: 9.9 mg/dL (ref 8.4–10.5)
Chloride: 104 mEq/L (ref 96–112)
Creatinine, Ser: 1.4 mg/dL (ref 0.4–1.5)
GFR: 61.99 mL/min (ref >60.00)
GLUCOSE: 85 mg/dL (ref 70–99)
POTASSIUM: 4.1 meq/L (ref 3.5–5.1)
SODIUM: 134 meq/L — AB (ref 135–145)

## 2014-02-21 NOTE — Patient Instructions (Signed)
Your physician recommends that you continue on your current medications as directed. Please refer to the Current Medication list given to you today.  Lab Today: [facility]   Your physician recommends that you schedule a follow-up appointment in: 3 month with Dr.Smith

## 2014-02-21 NOTE — Progress Notes (Signed)
Patient ID: [name], male   DOB: 31-Jul-1930, 80 y.o.   MRN: [id number]    [address], [address] [place], [place]  [address] Phone: [phone] Fax:  [phone]  Date:  02/21/2014   ID:  [name], DOB Jul 17, 1930, MRN [id number]  PCP:  [name], MD   ASSESSMENT:  1. Sudden pulmonary edema, resolved with medication adjustment.  Recent Doppler study demonstrated no evidence of renal artery stenosis 2. Coronary artery disease without significant proximal vessel disease but diffuse distal vessel involvement  3. Chronic diastolic heart failure, stable   PLAN:  1. Basic metabolic panel to reassess renal function on diuretic therapy 2. Clinical follow-up in 3 months   SUBJECTIVE: [name] is a 80 y.o. male who is doing well. He presented with sudden pulmonary edema and had extremely elevated blood pressure. Medication adjustments were made and he states that things a bit much better. Coronary angiography demonstrated diffuse distal 3 vessel coronary disease. No proximal vessel disease or need for intervention was identified. He has had no neurological complaints. A Doppler study done to detect renal artery stenosis was negative this week. Since being at home he is done well and denies orthopnea.   Wt Readings from Last 3 Encounters:  02/21/14 130 lb (58.968 kg)  02/13/14 119 lb 11.4 oz (54.3 kg)  01/29/14 139 lb (63.05 kg)     Past Medical History  Diagnosis Date  . Hypertension   . Anemia   . Coronary artery disease 11/2006, 02/10/2014    a. hx STEMI s/p PCI with BMS to RCA b. 02/12/2014, 3 v disease, largely nonobstructive, moderate disease in diag which is small. Medical therapy.  . Cancer 01/2007    hx Cecal CA s/p R hemicolectomy sec adenocarcinoma colon 02/06/07  . Prostate cancer     s/p intensity modulated radiation therapy    Current Outpatient Prescriptions  Medication Sig Dispense Refill  . amLODipine (NORVASC) 10 MG tablet Take 10 mg by  mouth daily.    Marland Kitchen aspirin 81 MG tablet Take 81 mg by mouth daily.    Marland Kitchen atorvastatin (LIPITOR) 40 MG tablet Take 1 tablet (40 mg total) by mouth daily. 30 tablet 11  . budesonide-formoterol (SYMBICORT) 160-4.5 MCG/ACT inhaler Inhale 2 puffs into the lungs 2 (two) times daily. 1 Inhaler 1  . furosemide (LASIX) 20 MG tablet Take 1 tablet (20 mg total) by mouth daily. 30 tablet 3  . hydrALAZINE (APRESOLINE) 50 MG tablet Take 1 tablet (50 mg total) by mouth 2 (two) times daily. 60 tablet 5  . IRON PO Take 1 tablet by mouth at bedtime.    . isosorbide mononitrate (IMDUR) 60 MG 24 hr tablet Take 1 tablet (60 mg total) by mouth daily. 30 tablet 11  . lisinopril (PRINIVIL,ZESTRIL) 40 MG tablet Take 40 mg by mouth daily.   5  . metoprolol tartrate (LOPRESSOR) 25 MG tablet Take 1 tablet (25 mg total) by mouth 2 (two) times daily. 60 tablet 5  . nitroGLYCERIN (NITROSTAT) 0.4 MG SL tablet Place 1 tablet (0.4 mg total) under the tongue every 5 (five) minutes as needed. For chest pain 25 tablet 1   No current facility-administered medications for this visit.    Allergies:   No Known Allergies  Social History:  The patient  reports that he has been smoking.  He does not have any smokeless tobacco history on file. He reports that he does not drink alcohol or use illicit drugs.   ROS:  Please see the history of present illness.   No edema. No orthopnea. No medication side effects.   All other systems reviewed and negative.   OBJECTIVE: VS:  BP 140/66 mmHg  Pulse 61  Ht 5\' 8"  (1.727 m)  Wt 130 lb (58.968 kg)  BMI 19.77 kg/m2 Well nourished, well developed, in no acute distress, appears consistent with stated age HEENT: normal Neck: JVD flat. Carotid bruit absent  Cardiac:  normal S1, S2; RRR; no murmur Lungs:  clear to auscultation bilaterally, no wheezing, rhonchi or rales Abd: soft, nontender, no hepatomegaly Ext: Edema absent. Pulses 2+ Skin: warm and dry Neuro:  CNs 2-12 intact, no focal  abnormalities noted  EKG:  None performed       Signed, Illene Labrador III, MD 02/21/2014 9:16 AM

## 2014-02-22 ENCOUNTER — Telehealth: Payer: Self-pay

## 2014-02-22 NOTE — Telephone Encounter (Signed)
Pt aware of lab results. Labs are stable. Continue current medical regimen as prescribed.  Pt verbalized understanding.

## 2014-02-22 NOTE — Telephone Encounter (Signed)
-----   Message from Sinclair Grooms, MD sent at 02/21/2014  7:11 PM EST ----- Labs are stable. Continue current medical regimen as prescribed.

## 2014-02-27 ENCOUNTER — Telehealth: Payer: Self-pay | Admitting: Interventional Cardiology

## 2014-02-27 NOTE — Telephone Encounter (Signed)
New message     Need ejection fraction

## 2014-02-27 NOTE — Telephone Encounter (Signed)
EF  WAS 45-50% ON 02-09-14  [facility]  NOTIFIED .Adonis Housekeeper

## 2014-05-28 ENCOUNTER — Other Ambulatory Visit: Payer: Self-pay | Admitting: Interventional Cardiology

## 2014-05-28 ENCOUNTER — Telehealth: Payer: Self-pay | Admitting: Interventional Cardiology

## 2014-05-28 ENCOUNTER — Ambulatory Visit (INDEPENDENT_AMBULATORY_CARE_PROVIDER_SITE_OTHER): Payer: Medicare Other | Admitting: Interventional Cardiology

## 2014-05-28 ENCOUNTER — Encounter: Payer: Self-pay | Admitting: Interventional Cardiology

## 2014-05-28 VITALS — BP 158/110 | HR 70 | Ht 68.0 in | Wt 138.4 lb

## 2014-05-28 DIAGNOSIS — I25118 Atherosclerotic heart disease of native coronary artery with other forms of angina pectoris: Secondary | ICD-10-CM | POA: Diagnosis not present

## 2014-05-28 DIAGNOSIS — I10 Essential (primary) hypertension: Secondary | ICD-10-CM | POA: Diagnosis not present

## 2014-05-28 DIAGNOSIS — E785 Hyperlipidemia, unspecified: Secondary | ICD-10-CM | POA: Diagnosis not present

## 2014-05-28 NOTE — Telephone Encounter (Signed)
New message     Pt was seen this am.  He is taking the following medications Nitrostat 0.4mg  , isosorbide 60mg , furosemide 20mg , metoprolol 25mg , atrovastatin 40mg , hydralazin 50mg , symbicort inhaler

## 2014-05-28 NOTE — Patient Instructions (Addendum)
Your physician recommends that you continue on your current medications as directed. Please refer to the Current Medication list given to you today.  Call the office with a list of medications you are currently taking. [phone]  Your physician wants you to follow-up in: 6 months with PA/NP.You will receive a reminder letter in the mail two months in advance. If you don't receive a letter, please call our office to schedule the follow-up appointment.  Bring a ;ist of your medications with you to your next office visit

## 2014-05-28 NOTE — Progress Notes (Signed)
Cardiology Office Note   Date:  05/28/2014   ID:  [name], DOB 05-24-1930, MRN [id number]  PCP:  [name], MD  Cardiologist:   Sinclair Grooms, MD   No chief complaint on file.     History of Present Illness: [name] is a 81 y.o. male who presents for hypertension, diastolic dysfunction, and coronary artery disease. Recent catheterization in 2015 demonstrated that the RCA stent placed several years ago is widely patent. He had diffuse circumflex disease and diagonal disease. Medical therapy was recommended. Hypertension was/is his major problem. He has significant confusion about which medications he takes. He is here alone today. May need social work help to ensure that he is taking his medications appropriately.    Past Medical History  Diagnosis Date  . Hypertension   . Anemia   . Coronary artery disease 11/2006, 02/10/2014    a. hx STEMI s/p PCI with BMS to RCA b. 02/12/2014, 3 v disease, largely nonobstructive, moderate disease in diag which is small. Medical therapy.  . Cancer 01/2007    hx Cecal CA s/p R hemicolectomy sec adenocarcinoma colon 02/06/07  . Prostate cancer     s/p intensity modulated radiation therapy    Past Surgical History  Procedure Laterality Date  . Cholecystectomy  02/06/07  . Hemicolectomy Right 02/06/07    secondary to adenocarcinoma right colon  . Cardiac catheterization  02/10/2014    a. hx STEMI s/p PCI with BMS to RCA b. 02/12/2014, 3 v disease, largely nonobstructive, moderate disease in diag which is small. Medical therapy.  . Left heart catheterization with coronary angiogram N/A 02/12/2014    Procedure: LEFT HEART CATHETERIZATION WITH CORONARY ANGIOGRAM;  Surgeon: Burnell Blanks, MD;  Location: [facility] CATH LAB;  Service: Cardiovascular;  Laterality: N/A;     Current Outpatient Prescriptions  Medication Sig Dispense Refill  . amLODipine (NORVASC) 10 MG tablet Take 10 mg by mouth daily.    Marland Kitchen aspirin 81 MG tablet  Take 81 mg by mouth daily.    Marland Kitchen atorvastatin (LIPITOR) 40 MG tablet Take 1 tablet (40 mg total) by mouth daily. 30 tablet 11  . budesonide-formoterol (SYMBICORT) 160-4.5 MCG/ACT inhaler Inhale 2 puffs into the lungs 2 (two) times daily. 1 Inhaler 1  . furosemide (LASIX) 20 MG tablet Take 1 tablet (20 mg total) by mouth daily. 30 tablet 3  . hydrALAZINE (APRESOLINE) 50 MG tablet Take 1 tablet (50 mg total) by mouth 2 (two) times daily. 60 tablet 5  . IRON PO Take 1 tablet by mouth at bedtime.    . isosorbide mononitrate (IMDUR) 60 MG 24 hr tablet Take 1 tablet (60 mg total) by mouth daily. 30 tablet 11  . lisinopril (PRINIVIL,ZESTRIL) 40 MG tablet Take 40 mg by mouth daily.   5  . metoprolol tartrate (LOPRESSOR) 25 MG tablet Take 1 tablet (25 mg total) by mouth 2 (two) times daily. 60 tablet 5  . nitroGLYCERIN (NITROSTAT) 0.4 MG SL tablet Place 1 tablet (0.4 mg total) under the tongue every 5 (five) minutes as needed. For chest pain 25 tablet 1   No current facility-administered medications for this visit.    Allergies:   Review of patient's allergies indicates no known allergies.    Social History:  The patient  reports that he has been smoking.  He does not have any smokeless tobacco history on file. He reports that he does not drink alcohol or use illicit drugs.   Family History:  The  patient's family history includes Stroke in his mother.    ROS:  Please see the history of present illness.   Otherwise, review of systems are positive for confusion concerning medications. Otherwise no complaints..   All other systems are reviewed and negative.    PHYSICAL EXAM: VS:  BP 158/110 mmHg  Pulse 70  Ht 5\' 8"  (1.727 m)  Wt 138 lb 6.4 oz (62.778 kg)  BMI 21.05 kg/m2 , BMI Body mass index is 21.05 kg/(m^2). GEN: Well nourished, well developed, in no acute distress HEENT: normal Neck: no JVD, carotid bruits, or masses Cardiac: RRR; no murmurs, rubs, or gallops,no edema  Respiratory:  clear  to auscultation bilaterally, normal work of breathing GI: soft, nontender, nondistended, + BS MS: no deformity or atrophy Skin: warm and dry, no rash Neuro:  Strength and sensation are intact Psych: euthymic mood, full affect   EKG:  EKG is not ordered today.    Recent Labs: 02/10/2014: Pro B Natriuretic peptide (BNP) 27880.0* 02/13/2014: ALT 10; Hemoglobin 12.5*; Platelets 136* 02/21/2014: BUN 25*; Creatinine 1.4; Potassium 4.1; Sodium 134*    Lipid Panel    Component Value Date/Time   CHOL  11/15/2006 0800    136        ATP III CLASSIFICATION:  <200     mg/dL   Desirable  200-239  mg/dL   Borderline High  >=240    mg/dL   High   TRIG 93 11/15/2006 0800   HDL 37* 11/15/2006 0800   CHOLHDL 3.7 11/15/2006 0800   VLDL 19 11/15/2006 0800   LDLCALC  11/15/2006 0800    80        Total Cholesterol/HDL:CHD Risk Coronary Heart Disease Risk Table                     Men   Women  1/2 Average Risk   3.4   3.3      Wt Readings from Last 3 Encounters:  05/28/14 138 lb 6.4 oz (62.778 kg)  02/21/14 130 lb (58.968 kg)  02/13/14 119 lb 11.4 oz (54.3 kg)      Other studies Reviewed: Additional studies/ records that were reviewed today include: None.   ASSESSMENT AND PLAN:  Coronary artery disease with other forms of angina pectoris  Essential hypertension: Poorly controlled and not understanding of medical regimen  Hyperlipidemia     Current medicines are reviewed at length with the patient today.  The patient has concerns regarding medicines.  The following changes have been made:  no change. He doesn't understand what his medications are and does not have a list. He has not had medication she had today.  Labs/ tests ordered today include:  No orders of the defined types were placed in this encounter.     Disposition:   FU with H. Smith/extender in 6 months   Signed, Sinclair Grooms, MD  05/28/2014 10:24 AM    [facility] [place], [place], [place]  [address] Phone: [phone]; Fax: [phone]

## 2014-05-29 NOTE — Telephone Encounter (Signed)
Noted. med list updated.

## 2014-06-09 ENCOUNTER — Other Ambulatory Visit: Payer: Self-pay | Admitting: Interventional Cardiology

## 2014-07-05 ENCOUNTER — Other Ambulatory Visit: Payer: Self-pay | Admitting: Physician Assistant

## 2014-07-06 ENCOUNTER — Other Ambulatory Visit: Payer: Self-pay | Admitting: Physician Assistant

## 2014-07-06 NOTE — Telephone Encounter (Signed)
Please review for refill. Thanks!  

## 2014-07-07 NOTE — Telephone Encounter (Signed)
ok 

## 2014-10-24 ENCOUNTER — Other Ambulatory Visit: Payer: Self-pay | Admitting: Physician Assistant

## 2014-10-24 ENCOUNTER — Other Ambulatory Visit: Payer: Self-pay | Admitting: Interventional Cardiology

## 2014-12-21 ENCOUNTER — Emergency Department (HOSPITAL_COMMUNITY): Payer: Medicare Other

## 2014-12-21 ENCOUNTER — Emergency Department (HOSPITAL_COMMUNITY)
Admission: EM | Admit: 2014-12-21 | Discharge: 2014-12-21 | Disposition: A | Discharge status: Home / Self Care | Payer: Medicare Other | Attending: Emergency Medicine | Admitting: Emergency Medicine

## 2014-12-21 ENCOUNTER — Encounter (HOSPITAL_COMMUNITY): Payer: Self-pay | Admitting: Emergency Medicine

## 2014-12-21 DIAGNOSIS — Z720 Tobacco use: Secondary | ICD-10-CM | POA: Diagnosis not present

## 2014-12-21 DIAGNOSIS — D649 Anemia, unspecified: Secondary | ICD-10-CM | POA: Insufficient documentation

## 2014-12-21 DIAGNOSIS — Z7982 Long term (current) use of aspirin: Secondary | ICD-10-CM | POA: Diagnosis not present

## 2014-12-21 DIAGNOSIS — Z792 Long term (current) use of antibiotics: Secondary | ICD-10-CM | POA: Diagnosis not present

## 2014-12-21 DIAGNOSIS — Z9889 Other specified postprocedural states: Secondary | ICD-10-CM | POA: Diagnosis not present

## 2014-12-21 DIAGNOSIS — J189 Pneumonia, unspecified organism: Secondary | ICD-10-CM

## 2014-12-21 DIAGNOSIS — Z79899 Other long term (current) drug therapy: Secondary | ICD-10-CM | POA: Diagnosis not present

## 2014-12-21 DIAGNOSIS — I10 Essential (primary) hypertension: Secondary | ICD-10-CM | POA: Insufficient documentation

## 2014-12-21 DIAGNOSIS — J159 Unspecified bacterial pneumonia: Secondary | ICD-10-CM | POA: Insufficient documentation

## 2014-12-21 DIAGNOSIS — I2510 Atherosclerotic heart disease of native coronary artery without angina pectoris: Secondary | ICD-10-CM | POA: Insufficient documentation

## 2014-12-21 DIAGNOSIS — Z8546 Personal history of malignant neoplasm of prostate: Secondary | ICD-10-CM | POA: Diagnosis not present

## 2014-12-21 DIAGNOSIS — R05 Cough: Secondary | ICD-10-CM | POA: Diagnosis present

## 2014-12-21 MED ORDER — AZITHROMYCIN 250 MG PO TABS: 250.0000 mg | ORAL_TABLET | Freq: Every day | ORAL | Status: AC

## 2014-12-21 MED ORDER — PREDNISONE 10 MG PO TABS: 40.0000 mg | ORAL_TABLET | Freq: Every day | ORAL | Status: AC

## 2014-12-21 MED ORDER — AZITHROMYCIN 250 MG PO TABS
500.0000 mg | ORAL_TABLET | Freq: Once | ORAL | Status: AC
  Administered 2014-12-21: 500 mg via ORAL
  Filled 2014-12-21: qty 2

## 2014-12-21 MED ORDER — PREDNISONE 20 MG PO TABS
40.0000 mg | ORAL_TABLET | ORAL | Status: AC
  Administered 2014-12-21: 40 mg via ORAL
  Filled 2014-12-21: qty 2

## 2014-12-21 NOTE — ED Provider Notes (Signed)
CSN: [id number]     Arrival date & time 12/21/14  1025 History   First MD Initiated Contact with Patient 12/21/14 1144     Chief Complaint  Patient presents with  . Cough     (Consider location/radiation/quality/duration/timing/severity/associated sxs/prior Treatment) HPI patient presents with concern of one week of cough, productive. Patient denies other ongoing complaints, denies chest pain, abdominal pain, nausea, vomiting, fever, chills. No relief with OTC medication. Patient continues to smoke, though he states that he has had diminished cigarette use in the past week.   Smoking cessation provided, particularly in light of this patient's evaluation in the ED.   Past Medical History  Diagnosis Date  . Hypertension   . Anemia   . Coronary artery disease 11/2006, 02/10/2014    a. hx STEMI s/p PCI with BMS to RCA b. 02/12/2014, 3 v disease, largely nonobstructive, moderate disease in diag which is small. Medical therapy.  . Cancer (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 Prostate cancer ([place])     s/p intensity modulated radiation therapy   Past Surgical History  Procedure Laterality Date  . Cholecystectomy  02/06/07  . Hemicolectomy Right 02/06/07    secondary to adenocarcinoma right colon  . Cardiac catheterization  02/10/2014    a. hx STEMI s/p PCI with BMS to RCA b. 02/12/2014, 3 v disease, largely nonobstructive, moderate disease in diag which is small. Medical therapy.  . Left heart catheterization with coronary angiogram N/A 02/12/2014    Procedure: LEFT HEART CATHETERIZATION WITH CORONARY ANGIOGRAM;  Surgeon: Burnell Blanks, MD;  Location: [facility] CATH LAB;  Service: Cardiovascular;  Laterality: N/A;   Family History  Problem Relation Age of Onset  . Stroke Mother    Social History  Substance Use Topics  . Smoking status: Current Some Day Smoker  . Smokeless tobacco: None  . Alcohol Use: No    Review of Systems   Constitutional:       Per HPI, otherwise negative  HENT:       Per HPI, otherwise negative  Respiratory:       Per HPI, otherwise negative  Cardiovascular:       Per HPI, otherwise negative  Gastrointestinal: Negative for vomiting.  Endocrine:       Negative aside from HPI  Genitourinary:       Neg aside from HPI   Musculoskeletal:       Per HPI, otherwise negative  Skin: Negative.   Neurological: Negative for syncope.      Allergies  Review of patient's allergies indicates no known allergies.  Home Medications   Prior to Admission medications   Medication Sig Start Date End Date Taking? Authorizing Provider  aspirin 81 MG tablet Take 81 mg by mouth daily.    Historical Provider, MD  atorvastatin (LIPITOR) 40 MG tablet Take 1 tablet (40 mg total) by mouth daily. 01/29/14   Belva Crome, MD  azithromycin (ZITHROMAX) 250 MG tablet Take 1 tablet (250 mg total) by mouth daily. 12/22/14 12/25/14  Carmin Muskrat, MD  furosemide (LASIX) 20 MG tablet TAKE 1 TABLET BY MOUTH EVERY DAY 10/24/14   Belva Crome, MD  hydrALAZINE (APRESOLINE) 50 MG tablet Take 1 tablet (50 mg total) by mouth 2 (two) times daily. 02/13/14   Almyra Deforest, PA  IRON PO Take 1 tablet by mouth at bedtime.    Historical Provider, MD  isosorbide mononitrate (IMDUR) 60 MG 24 hr tablet  Take 1 tablet (60 mg total) by mouth daily. 01/29/14   Belva Crome, MD  lisinopril (PRINIVIL,ZESTRIL) 40 MG tablet Take 40 mg by mouth daily.  02/18/14   Historical Provider, MD  metoprolol tartrate (LOPRESSOR) 25 MG tablet Take 1 tablet (25 mg total) by mouth 2 (two) times daily. 02/13/14   Almyra Deforest, PA  NITROSTAT 0.4 MG SL tablet PLACE 1 TABLET UNDER THE TONGUE EVERY 5 MINUTES AS NEEDED FOR CHEST PAIN 05/29/14   Belva Crome, MD  predniSONE (DELTASONE) 10 MG tablet Take 4 tablets (40 mg total) by mouth daily. 12/22/14 12/25/14  Carmin Muskrat, MD  SYMBICORT 160-4.5 MCG/ACT inhaler INHALE 2 PUFFS BY MOUTH TWICE DAILY 10/24/14   Belva Crome, MD   BP 173/64 mmHg  Pulse 52  Temp(Src) 98.6 F (37 C) (Oral)  Resp 18  Ht 5\' 8"  (1.727 m)  Wt 140 lb (63.504 kg)  BMI 21.29 kg/m2  SpO2 99% Physical Exam  Constitutional: He is oriented to person, place, and time. He appears well-developed. No distress.  HENT:  Head: Normocephalic and atraumatic.  Eyes: Conjunctivae and EOM are normal.  Cardiovascular: Normal rate and regular rhythm.   Pulmonary/Chest: Effort normal. No stridor. No respiratory distress.  Abdominal: He exhibits no distension.  Musculoskeletal: He exhibits no edema.  Neurological: He is alert and oriented to person, place, and time.  Skin: Skin is warm and dry.  Psychiatric: He has a normal mood and affect.  Nursing note and vitals reviewed.   ED Course  Procedures (including critical care time) Labs Review Labs Reviewed - No data to display  Imaging Review Dg Chest 2 View  12/21/2014  CLINICAL DATA:  Cough and chest congestion. Forty year smoking history. EXAM: One-view chest x-ray 02/11/2014. COMPARISON:  One-view chest x-ray 02/11/2014 FINDINGS: The heart size is normal. Emphysematous changes are noted. The a linear density is present in the left lower lobe. The lungs are otherwise clear. There is no edema or effusion. The visualized soft tissues and bony thorax are unremarkable. IMPRESSION: 1. Emphysema. 2. Linear density in the left lower lobe. This likely represents atelectasis or minimal infection. Recommend follow-up chest x-ray to assure clearing. Electronically Signed   By: San Morelle M.D.   On: 12/21/2014 12:01   I have personally reviewed and evaluated these images and lab results as part of my medical decision-making.    MDM   Final diagnoses:  CAP (community acquired pneumonia)   Elderly male smoker presents with isolated cough, not responsive to OTC medication, and x-ray concerning for possible pneumonia. No evidence for eminent decompensation, bacteremia or sepsis. She  started on antibiotics, steroids given his smoking history, likely COPD. patient will follow-up with primary care   Carmin Muskrat, MD 12/21/14 1224

## 2014-12-21 NOTE — ED Notes (Signed)
Pt c/o productive cough x 1 week, with yellow secretions. Pt denies being around others with illness.

## 2014-12-21 NOTE — Discharge Instructions (Signed)
As discussed, with your continued smoking, and your new x-ray concerning for pneumonia, it is important to take medication as directed, be sure to follow up with her primary care physician in one week. Return here for concerning changes in your condition. Community-Acquired Pneumonia, Adult Pneumonia is an infection of the lungs. There are different types of pneumonia. One type can develop while a person is in a hospital. A different type, called community-acquired pneumonia, develops in people who are not, or have not recently been, in the hospital or other health care facility.  CAUSES Pneumonia may be caused by bacteria, viruses, or funguses. Community-acquired pneumonia is often caused by Streptococcus pneumonia bacteria. These bacteria are often passed from one person to another by breathing in droplets from the cough or sneeze of an infected person. RISK FACTORS The condition is more likely to develop in:  People who havechronic diseases, such as chronic obstructive pulmonary disease (COPD), asthma, congestive heart failure, cystic fibrosis, diabetes, or kidney disease.  People who haveearly-stage or late-stage HIV.  People who havesickle cell disease.  People who havehad their spleen removed (splenectomy).  People who havepoor Human resources officer.  People who havemedical conditions that increase the risk of breathing in (aspirating) secretions their own mouth and nose.   People who havea weakened immune system (immunocompromised).  People who smoke.  People whotravel to areas where pneumonia-causing germs commonly exist.  People whoare around animal habitats or animals that have pneumonia-causing germs, including birds, bats, rabbits, cats, and farm animals. SYMPTOMS Symptoms of this condition include:  Adry cough.  A wet (productive) cough.  Fever.  Sweating.  Chest pain, especially when breathing deeply or coughing.  Rapid breathing or difficulty  breathing.  Shortness of breath.  Shaking chills.  Fatigue.  Muscle aches. DIAGNOSIS Your health care provider will take a medical history and perform a physical exam. You may also have other tests, including:  Imaging studies of your chest, including X-rays.  Tests to check your blood oxygen level and other blood gases.  Other tests on blood, mucus (sputum), fluid around your lungs (pleural fluid), and urine. If your pneumonia is severe, other tests may be done to identify the specific cause of your illness. TREATMENT The type of treatment that you receive depends on many factors, such as the cause of your pneumonia, the medicines you take, and other medical conditions that you have. For most adults, treatment and recovery from pneumonia may occur at home. In some cases, treatment must happen in a hospital. Treatment may include:  Antibiotic medicines, if the pneumonia was caused by bacteria.  Antiviral medicines, if the pneumonia was caused by a virus.  Medicines that are given by mouth or through an IV tube.  Oxygen.  Respiratory therapy. Although rare, treating severe pneumonia may include:  Mechanical ventilation. This is done if you are not breathing well on your own and you cannot maintain a safe blood oxygen level.  Thoracentesis. This procedureremoves fluid around one lung or both lungs to help you breathe better. HOME CARE INSTRUCTIONS  Take over-the-counter and prescription medicines only as told by your health care provider.  Only takecough medicine if you are losing sleep. Understand that cough medicine can prevent your body's natural ability to remove mucus from your lungs.  If you were prescribed an antibiotic medicine, take it as told by your health care provider. Do not stop taking the antibiotic even if you start to feel better.  Sleep in a semi-upright position at night. Try sleeping  in a reclining chair, or place a few pillows under your head.  Do  not use tobacco products, including cigarettes, chewing tobacco, and e-cigarettes. If you need help quitting, ask your health care provider.  Drink enough water to keep your urine clear or pale yellow. This will help to thin out mucus secretions in your lungs. PREVENTION There are ways that you can decrease your risk of developing community-acquired pneumonia. Consider getting a pneumococcal vaccine if:  You are older than 79 years of age.  You are older than 79 years of age and are undergoing cancer treatment, have chronic lung disease, or have other medical conditions that affect your immune system. Ask your health care provider if this applies to you. There are different types and schedules of pneumococcal vaccines. Ask your health care provider which vaccination option is best for you. You may also prevent community-acquired pneumonia if you take these actions:  Get an influenza vaccine every year. Ask your health care provider which type of influenza vaccine is best for you.  Go to the dentist on a regular basis.  Wash your hands often. Use hand sanitizer if soap and water are not available. SEEK MEDICAL CARE IF:  You have a fever.  You are losing sleep because you cannot control your cough with cough medicine. SEEK IMMEDIATE MEDICAL CARE IF:  You have worsening shortness of breath.  You have increased chest pain.  Your sickness becomes worse, especially if you are an older adult or have a weakened immune system.  You cough up blood.   This information is not intended to replace advice given to you by your health care provider. Make sure you discuss any questions you have with your health care provider.   Document Released: 02/23/2005 Document Revised: 11/14/2014 Document Reviewed: 06/20/2014 Elsevier Interactive Patient Education [employer].

## 2015-01-10 ENCOUNTER — Other Ambulatory Visit: Payer: Self-pay | Admitting: Interventional Cardiology

## 2015-01-14 NOTE — Telephone Encounter (Signed)
I am not responsible for that med.

## 2015-01-15 NOTE — Telephone Encounter (Signed)
His PCP should take care of this.

## 2015-01-28 DIAGNOSIS — C61 Malignant neoplasm of prostate: Secondary | ICD-10-CM | POA: Diagnosis not present

## 2015-01-29 DIAGNOSIS — I10 Essential (primary) hypertension: Secondary | ICD-10-CM | POA: Diagnosis not present

## 2015-01-29 DIAGNOSIS — I2510 Atherosclerotic heart disease of native coronary artery without angina pectoris: Secondary | ICD-10-CM | POA: Diagnosis not present

## 2015-01-29 DIAGNOSIS — F17210 Nicotine dependence, cigarettes, uncomplicated: Secondary | ICD-10-CM | POA: Diagnosis not present

## 2015-01-29 DIAGNOSIS — J449 Chronic obstructive pulmonary disease, unspecified: Secondary | ICD-10-CM | POA: Diagnosis not present

## 2015-02-01 ENCOUNTER — Emergency Department (HOSPITAL_COMMUNITY)
Admission: EM | Admit: 2015-02-01 | Discharge: 2015-02-01 | Disposition: A | Discharge status: Home / Self Care | Payer: Medicare Other | Attending: Emergency Medicine | Admitting: Emergency Medicine

## 2015-02-01 ENCOUNTER — Emergency Department (HOSPITAL_COMMUNITY): Payer: Medicare Other

## 2015-02-01 ENCOUNTER — Encounter (HOSPITAL_COMMUNITY): Payer: Self-pay | Admitting: Emergency Medicine

## 2015-02-01 DIAGNOSIS — D649 Anemia, unspecified: Secondary | ICD-10-CM | POA: Diagnosis not present

## 2015-02-01 DIAGNOSIS — R059 Cough, unspecified: Secondary | ICD-10-CM

## 2015-02-01 DIAGNOSIS — Z8546 Personal history of malignant neoplasm of prostate: Secondary | ICD-10-CM | POA: Diagnosis not present

## 2015-02-01 DIAGNOSIS — Z9889 Other specified postprocedural states: Secondary | ICD-10-CM | POA: Diagnosis not present

## 2015-02-01 DIAGNOSIS — I2510 Atherosclerotic heart disease of native coronary artery without angina pectoris: Secondary | ICD-10-CM | POA: Insufficient documentation

## 2015-02-01 DIAGNOSIS — Z7982 Long term (current) use of aspirin: Secondary | ICD-10-CM | POA: Insufficient documentation

## 2015-02-01 DIAGNOSIS — R05 Cough: Secondary | ICD-10-CM | POA: Diagnosis not present

## 2015-02-01 DIAGNOSIS — I10 Essential (primary) hypertension: Secondary | ICD-10-CM | POA: Insufficient documentation

## 2015-02-01 DIAGNOSIS — Z79899 Other long term (current) drug therapy: Secondary | ICD-10-CM | POA: Insufficient documentation

## 2015-02-01 DIAGNOSIS — Z85038 Personal history of other malignant neoplasm of large intestine: Secondary | ICD-10-CM | POA: Insufficient documentation

## 2015-02-01 DIAGNOSIS — Z7951 Long term (current) use of inhaled steroids: Secondary | ICD-10-CM | POA: Diagnosis not present

## 2015-02-01 DIAGNOSIS — F17200 Nicotine dependence, unspecified, uncomplicated: Secondary | ICD-10-CM | POA: Insufficient documentation

## 2015-02-01 LAB — BASIC METABOLIC PANEL
Anion gap: 4 — ABNORMAL LOW (ref 5–15)
BUN: 20 mg/dL (ref 6–20)
CALCIUM: 10.3 mg/dL (ref 8.9–10.3)
CO2: 30 mmol/L (ref 22–32)
CREATININE: 1.36 mg/dL — AB (ref 0.61–1.24)
Chloride: 103 mmol/L (ref 101–111)
GFR calc non Af Amer: 47 mL/min — ABNORMAL LOW (ref >60)
GFR, EST AFRICAN AMERICAN: 54 mL/min — AB (ref >60)
GLUCOSE: 92 mg/dL (ref 65–99)
Potassium: 4.6 mmol/L (ref 3.5–5.1)
Sodium: 137 mmol/L (ref 135–145)

## 2015-02-01 LAB — CBC WITH DIFFERENTIAL/PLATELET
Basophils Absolute: 0.1 10*3/uL (ref 0.0–0.1)
Basophils Relative: 1 %
EOS ABS: 1.5 10*3/uL — AB (ref 0.0–0.7)
Eosinophils Relative: 29 %
HEMATOCRIT: 36.8 % — AB (ref 39.0–52.0)
Hemoglobin: 11.9 g/dL — ABNORMAL LOW (ref 13.0–17.0)
LYMPHS PCT: 26 %
Lymphs Abs: 1.3 10*3/uL (ref 0.7–4.0)
MCH: 28.8 pg (ref 26.0–34.0)
MCHC: 32.3 g/dL (ref 30.0–36.0)
MCV: 89.1 fL (ref 78.0–100.0)
Monocytes Absolute: 0.3 10*3/uL (ref 0.1–1.0)
Monocytes Relative: 5 %
Neutro Abs: 1.8 10*3/uL (ref 1.7–7.7)
Neutrophils Relative %: 39 %
Platelets: 152 10*3/uL (ref 150–400)
RBC: 4.13 MIL/uL — ABNORMAL LOW (ref 4.22–5.81)
RDW: 14.1 % (ref 11.5–15.5)
WBC: 5 10*3/uL (ref 4.0–10.5)

## 2015-02-01 MED ORDER — AEROCHAMBER PLUS FLO-VU MEDIUM MISC: 1.0000 | Freq: Once | Status: DC

## 2015-02-01 MED ORDER — ALBUTEROL SULFATE HFA 108 (90 BASE) MCG/ACT IN AERS: 1.0000 | INHALATION_SPRAY | Freq: Four times a day (QID) | RESPIRATORY_TRACT | Status: DC | PRN

## 2015-02-01 MED ORDER — IPRATROPIUM-ALBUTEROL 0.5-2.5 (3) MG/3ML IN SOLN
3.0000 mL | Freq: Once | RESPIRATORY_TRACT | Status: AC
  Administered 2015-02-01: 3 mL via RESPIRATORY_TRACT
  Filled 2015-02-01: qty 3

## 2015-02-01 MED ORDER — DEXAMETHASONE 2 MG PO TABS: 10.0000 mg | ORAL_TABLET | Freq: Once | ORAL | Status: DC

## 2015-02-01 MED ORDER — DEXAMETHASONE 4 MG PO TABS
10.0000 mg | ORAL_TABLET | Freq: Once | ORAL | Status: AC
  Administered 2015-02-01: 10 mg via ORAL
  Filled 2015-02-01: qty 3

## 2015-02-01 NOTE — ED Provider Notes (Signed)
CSN: [id number]     Arrival date & time 02/01/15  [id number] History   First MD Initiated Contact with Patient 02/01/15 1108     Chief Complaint  Patient presents with  . Cough     (Consider location/radiation/quality/duration/timing/severity/associated sxs/prior Treatment) HPI Comments: 81 y.o. Male with history of HTN, CAD, anemia, chronic smoker presents for cough x3 weeks.  The patient reports that he otherwise feels well but has had a persistent cough over this time that is non productive.  He says that it has been keeping him awake and night.  He denies chest pain, nasal congestion, sneezing, shortness of breath, leg pain or swelling.  He has been seen for this outpatient and was prescribed tessalon without improvement in cough.  Patient has been on Lisinopril for a long time.  Patient is a 81 y.o. male presenting with cough.  Cough Associated symptoms: no chest pain, no chills, no diaphoresis, no myalgias, no rash, no rhinorrhea, no shortness of breath and no wheezing     Past Medical History  Diagnosis Date  . Hypertension   . Anemia   . Coronary artery disease 11/2006, 02/10/2014    a. hx STEMI s/p PCI with BMS to RCA b. 02/12/2014, 3 v disease, largely nonobstructive, moderate disease in diag which is small. Medical therapy.  . 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 Prostate cancer ([place])     s/p intensity modulated radiation therapy   Past Surgical History  Procedure Laterality Date  . Cholecystectomy  02/06/07  . Hemicolectomy Right 02/06/07    secondary to adenocarcinoma right colon  . Cardiac catheterization  02/10/2014    a. hx STEMI s/p PCI with BMS to RCA b. 02/12/2014, 3 v disease, largely nonobstructive, moderate disease in diag which is small. Medical therapy.  . Left heart catheterization with coronary angiogram N/A 02/12/2014    Procedure: LEFT HEART CATHETERIZATION WITH CORONARY ANGIOGRAM;  Surgeon: Burnell Blanks, MD;  Location: [facility] CATH LAB;  Service: Cardiovascular;  Laterality: N/A;   Family History  Problem Relation Age of Onset  . Stroke Mother    Social History  Substance Use Topics  . Smoking status: Current Some Day Smoker  . Smokeless tobacco: None  . Alcohol Use: No    Review of Systems  Constitutional: Negative for chills, diaphoresis, activity change and fatigue.  HENT: Negative for congestion, postnasal drip, rhinorrhea and sinus pressure.   Eyes: Negative for pain, redness and visual disturbance.  Respiratory: Positive for cough. Negative for chest tightness, shortness of breath and wheezing.   Cardiovascular: Negative for chest pain and palpitations.  Gastrointestinal: Negative for nausea, vomiting, abdominal pain, diarrhea and constipation.  Genitourinary: Negative for dysuria and flank pain.  Musculoskeletal: Negative for myalgias and back pain.  Skin: Negative for rash.  Neurological: Negative for dizziness, weakness and numbness.  Hematological: Does not bruise/bleed easily.      Allergies  Review of patient's allergies indicates no known allergies.  Home Medications   Prior to Admission medications   Medication Sig Start Date End Date Taking? Authorizing Provider  amLODipine (NORVASC) 10 MG tablet Take 10 mg by mouth daily.   Yes Historical Provider, MD  aspirin 81 MG tablet Take 81 mg by mouth daily.   Yes Historical Provider, MD  atorvastatin (LIPITOR) 40 MG tablet Take 1 tablet (40 mg total) by mouth daily. 01/29/14  Yes Belva Crome, MD  benzonatate (TESSALON) 100 MG  capsule Take 100 mg by mouth 3 (three) times daily as needed for cough.   Yes Historical Provider, MD  furosemide (LASIX) 20 MG tablet TAKE 1 TABLET BY MOUTH EVERY DAY 10/24/14  Yes Belva Crome, MD  hydrALAZINE (APRESOLINE) 50 MG tablet Take 50 mg by mouth 2 (two) times daily.   Yes Historical Provider, MD  lisinopril (PRINIVIL,ZESTRIL) 40 MG tablet Take 40 mg by mouth daily.  02/18/14   Yes Historical Provider, MD  metoprolol tartrate (LOPRESSOR) 25 MG tablet Take 1 tablet (25 mg total) by mouth 2 (two) times daily. 02/13/14  Yes Almyra Deforest, PA  SYMBICORT 160-4.5 MCG/ACT inhaler INHALE 2 PUFFS BY MOUTH TWICE DAILY 10/24/14  Yes Belva Crome, MD  albuterol (PROVENTIL HFA;VENTOLIN HFA) 108 (90 BASE) MCG/ACT inhaler Inhale 1-2 puffs into the lungs every 6 (six) hours as needed for wheezing or shortness of breath (cough). 02/01/15   Harvel Quale, MD  dexamethasone (DECADRON) 2 MG tablet Take 5 tablets (10 mg total) by mouth once. Take as a single dose on Sunday morning after eating breakfast. 02/03/15   Harvel Quale, MD  hydrALAZINE (APRESOLINE) 50 MG tablet Take 1 tablet (50 mg total) by mouth 2 (two) times daily. Patient not taking: Reported on 02/01/2015 02/13/14   Almyra Deforest, PA  IRON PO Take 1 tablet by mouth at bedtime.    Historical Provider, MD  isosorbide mononitrate (IMDUR) 60 MG 24 hr tablet Take 1 tablet (60 mg total) by mouth daily. Patient not taking: Reported on 02/01/2015 01/29/14   Belva Crome, MD  NITROSTAT 0.4 MG SL tablet PLACE 1 TABLET UNDER THE TONGUE EVERY 5 MINUTES AS NEEDED FOR CHEST PAIN 05/29/14   Belva Crome, MD  Spacer/Aero-Holding Chambers (AEROCHAMBER PLUS FLO-VU MEDIUM) MISC 1 each by Other route once. 02/01/15   Harvel Quale, MD   BP 178/71 mmHg  Pulse 52  Temp(Src) 98.1 F (36.7 C) (Oral)  Resp 16  SpO2 100% Physical Exam  Constitutional: He is oriented to person, place, and time. He appears well-developed and well-nourished. No distress.  HENT:  Head: Normocephalic and atraumatic.  Right Ear: External ear normal.  Left Ear: External ear normal.  Mouth/Throat: Oropharynx is clear and moist. No oropharyngeal exudate.  Eyes: EOM are normal. Pupils are equal, round, and reactive to light.  Neck: Normal range of motion. Neck supple.  Cardiovascular: Normal rate, regular rhythm, normal heart sounds and intact distal pulses.   No murmur  heard. Pulmonary/Chest: Effort normal. No respiratory distress. He has no wheezes. He has no rales.  Abdominal: Soft. He exhibits no distension. There is no tenderness.  Musculoskeletal: He exhibits no edema.  Neurological: He is alert and oriented to person, place, and time.  Skin: Skin is warm and dry. No rash noted. He is not diaphoretic.  Vitals reviewed.   ED Course  Procedures (including critical care time) Labs Review Labs Reviewed  CBC WITH DIFFERENTIAL/PLATELET - Abnormal; Notable for the following:    RBC 4.13 (*)    Hemoglobin 11.9 (*)    HCT 36.8 (*)    Eosinophils Absolute 1.5 (*)    All other components within normal limits  BASIC METABOLIC PANEL - Abnormal; Notable for the following:    Creatinine, Ser 1.36 (*)    GFR calc non Af Amer 47 (*)    GFR calc Af Amer 54 (*)    Anion gap 4 (*)    All other components within normal limits  Imaging Review Dg Chest 2 View  02/01/2015  CLINICAL DATA:  Cough 3 weeks EXAM: CHEST  2 VIEW COMPARISON:  12/21/2014 FINDINGS: Mild cardiac enlargement. Negative for heart failure. Lungs are clear without infiltrate or effusion. No acute skeletal abnormality. IMPRESSION: No active cardiopulmonary disease. Electronically Signed   By: Franchot Gallo M.D.   On: 02/01/2015 12:35   I have personally reviewed and evaluated these images and lab results as part of my medical decision-making.   EKG Interpretation None      MDM  Patient seen and evaluated in stable condition.  Chest xray unremarkable.  Witnessed cough dry and non productive.  Patient otherwise well appearing.  Breathing treatment and decadron given in light of smoking history and likely underlying COPD.  Patient's symptoms improved with breathing treatment and decadron.  Chest xray unremarkable.  No signs of acute infection.  Discussed with patient and daugther clinical impression and plan of care which they expressed understanding and agreement with.  Patient discharged to  follow up with PCP next week and with prescriptions for albuterol inhaler with spacer as well as second dose of decadron.  They were informed if symptoms do not improve this could be related to Lisinopril. Final diagnoses:  Cough    1. Cough    Harvel Quale, MD 02/01/15 1440

## 2015-02-01 NOTE — ED Notes (Signed)
Family at bedside. 

## 2015-02-01 NOTE — Discharge Instructions (Signed)
You were seen today for your cough.  This may be related to your smoking and COPD.  Take the medications prescribed.  Follow up with your primary care physician next week.  If the cough persists despite this treatment it may secondary to the medication Lisinopril that you are on so make sure you ask your primary care physician about this next week.  Cough, Adult Coughing is a reflex that clears your throat and your airways. Coughing helps to heal and protect your lungs. It is normal to cough occasionally, but a cough that happens with other symptoms or lasts a long time may be a sign of a condition that needs treatment. A cough may last only 2-3 weeks (acute), or it may last longer than 8 weeks (chronic). CAUSES Coughing is commonly caused by:  Breathing in substances that irritate your lungs.  A viral or bacterial respiratory infection.  Allergies.  Asthma.  Postnasal drip.  Smoking.  Acid backing up from the stomach into the esophagus (gastroesophageal reflux).  Certain medicines.  Chronic lung problems, including COPD (or rarely, lung cancer).  Other medical conditions such as heart failure. HOME CARE INSTRUCTIONS  Pay attention to any changes in your symptoms. Take these actions to help with your discomfort:  Take medicines only as told by your health care provider.  If you were prescribed an antibiotic medicine, take it as told by your health care provider. Do not stop taking the antibiotic even if you start to feel better.  Talk with your health care provider before you take a cough suppressant medicine.  Drink enough fluid to keep your urine clear or pale yellow.  If the air is dry, use a cold steam vaporizer or humidifier in your bedroom or your home to help loosen secretions.  Avoid anything that causes you to cough at work or at home.  If your cough is worse at night, try sleeping in a semi-upright position.  Avoid cigarette smoke. If you smoke, quit smoking. If  you need help quitting, ask your health care provider.  Avoid caffeine.  Avoid alcohol.  Rest as needed. SEEK MEDICAL CARE IF:   You have new symptoms.  You cough up pus.  Your cough does not get better after 2-3 weeks, or your cough gets worse.  You cannot control your cough with suppressant medicines and you are losing sleep.  You develop pain that is getting worse or pain that is not controlled with pain medicines.  You have a fever.  You have unexplained weight loss.  You have night sweats. SEEK IMMEDIATE MEDICAL CARE IF:  You cough up blood.  You have difficulty breathing.  Your heartbeat is very fast.   This information is not intended to replace advice given to you by your health care provider. Make sure you discuss any questions you have with your health care provider.   Document Released: 08/22/2010 Document Revised: 11/14/2014 Document Reviewed: 05/02/2014 Elsevier Interactive Patient Education [employer].

## 2015-02-01 NOTE — ED Notes (Signed)
Pt. Stated, I've had a cough for 3 weeks, I came over here and got some medicine and it helped and came back. No other symptoms

## 2015-02-04 DIAGNOSIS — R972 Elevated prostate specific antigen [PSA]: Secondary | ICD-10-CM | POA: Diagnosis not present

## 2015-02-04 DIAGNOSIS — C61 Malignant neoplasm of prostate: Secondary | ICD-10-CM | POA: Diagnosis not present

## 2015-02-20 ENCOUNTER — Ambulatory Visit: Payer: Self-pay | Admitting: Internal Medicine

## 2015-02-28 ENCOUNTER — Other Ambulatory Visit: Payer: Self-pay | Admitting: Physician Assistant

## 2015-02-28 ENCOUNTER — Other Ambulatory Visit: Payer: Self-pay | Admitting: Interventional Cardiology

## 2015-02-28 NOTE — Telephone Encounter (Signed)
Wants symbicort inhaler refilled

## 2015-04-08 ENCOUNTER — Other Ambulatory Visit: Payer: Self-pay | Admitting: Physician Assistant

## 2015-04-08 NOTE — Telephone Encounter (Signed)
Please review for refill. Thanks!  

## 2015-04-19 ENCOUNTER — Encounter: Payer: Self-pay | Admitting: Internal Medicine

## 2015-04-19 ENCOUNTER — Ambulatory Visit (INDEPENDENT_AMBULATORY_CARE_PROVIDER_SITE_OTHER): Payer: Self-pay | Admitting: Internal Medicine

## 2015-04-19 VITALS — BP 138/80 | HR 82 | Ht 68.0 in | Wt 133.0 lb

## 2015-04-19 DIAGNOSIS — I5032 Chronic diastolic (congestive) heart failure: Secondary | ICD-10-CM

## 2015-04-19 DIAGNOSIS — N189 Chronic kidney disease, unspecified: Secondary | ICD-10-CM

## 2015-04-19 DIAGNOSIS — N184 Chronic kidney disease, stage 4 (severe): Secondary | ICD-10-CM | POA: Insufficient documentation

## 2015-04-19 DIAGNOSIS — I25118 Atherosclerotic heart disease of native coronary artery with other forms of angina pectoris: Secondary | ICD-10-CM

## 2015-04-19 DIAGNOSIS — J449 Chronic obstructive pulmonary disease, unspecified: Secondary | ICD-10-CM

## 2015-04-19 DIAGNOSIS — Z716 Tobacco abuse counseling: Secondary | ICD-10-CM

## 2015-04-19 DIAGNOSIS — I10 Essential (primary) hypertension: Secondary | ICD-10-CM

## 2015-04-19 DIAGNOSIS — Z720 Tobacco use: Secondary | ICD-10-CM

## 2015-04-19 MED ORDER — NICOTINE 7 MG/24HR TD PT24: MEDICATED_PATCH | TRANSDERMAL | Status: DC

## 2015-04-19 MED ORDER — NICOTINE 21 MG/24HR TD PT24
MEDICATED_PATCH | TRANSDERMAL | Status: DC
Start: 2015-04-19 — End: 2015-04-19

## 2015-04-19 MED ORDER — NICOTINE 14 MG/24HR TD PT24: MEDICATED_PATCH | TRANSDERMAL | Status: DC

## 2015-04-19 MED ORDER — NICOTINE 21 MG/24HR TD PT24: MEDICATED_PATCH | TRANSDERMAL | Status: DC

## 2015-04-19 NOTE — Patient Instructions (Signed)
We will call to set up and appointment for [name] next week. We will get you set up with a two a day dose pill box for your meds

## 2015-04-19 NOTE — Progress Notes (Signed)
Subjective:    Patient ID: [name], male    DOB: 11-Oct-1930, 82 y.o.   MRN: [id number]  HPI   1.  Essential Hypertensive:  Many years.  Has diastolic heart failure and has had hypertensive urgency in past.   Has also suffered pulmonary edema with his failure in past.  Stopped his Furosemide 2 months ago as he takes all of his meds at bedtime and was having to get up frequently to urinate.  When he does not take furosemide, no problems with nocturia. Takes Amlodipine 10 mg, Losartan 100 mg, and Hydralazine 100 mg at night after his evening meal.  Gets "swimmy headed"  At times after taking.  Does not take Hydralazine in the morning. Not clear if he is to be taking Metoprolol.  He does not have that today.    2.  COPD:  Started smoking age 71 yo.  At most smoked 1 1/2 ppd.  Now down to 1/2 ppd.  Wife no longer smokes.  Smokes in the house.  Wants to quit.  Has never really tried anything. Not using Symbicort for past 3 months.  Was having a chronic cough earlier in year, but that has resolved.  Remembers having Albuterol in past, but does not know if he has used in some time.  States he has trouble remembering what to use when. Has not had flu vaccine this year and refuses today.  3.  CAD:  Usually has to take NTG SL about once monthly.  States the pain is usually very mild and cannot say it occurs with exertion.  Mainly with sitting.   Had just dyspnea with MI in past, no CP.  [employer] and he has not utilized Isosorbid Mononitrate from their facility since 06/2014.   4.  Hyperlipidemia:  Takes Atorvastatin 40 mg daily.  Unable to see a recent FLP in his lab data.   5.  History of colon cancer.  Follows up with Dr. Alyson Ingles per patient.  Previously followed by Dr. Janice Norrie.   MEDS: Amlodipine 10 mg daily ASA 81 mg daily Atorvastatin 40 mg daily Benzonatate 100 mg capsule three times daily as needed (not using much) Hydralazine 100 mg twice daily Losartan 100 mg once  daily        Review of Systems     Objective:   Physical Exam  NAD HEENT:  PERRL, EOMI, TMs pearly gray, throat without injection Neck:  Supple, no adenopathy, No JVD Chest:  CTA.  No wheeze and good air movement today. CV: RRR with normal S1 and S2, No S3, S4, or murmur appreciated.  Radial and DP pulses normal and equal Abd:  S, NT, No HSM or masses appreciated, +BS Extrems:  No edema        Assessment & Plan:  1.  CAD:  Sounds stable recently.Hold Isosorbide for now.  2.  Essential Hypertension:  Have asked patient if fine for our [facility] Team to come to his home and assess him there beginning of next week.   We need to look through his pills and be certain what he is taking.  Also to set up a pill box. Concerned he stops taking meds and then develops symptoms of his diastolic HF.  I have written what he takes his meds for in large lettering beside each one on his discharge papers.   For now, to take Losartan 100 mg, Amlodipine 10 mg, and Hydralazine 100 mg twice daily. He is to  take the Losartan in the morning with his Hydralazine He is to take the Amlodipine with the evening dose of Hydralazine. If not controlled, will add back the Metoprolol, if indeed, he is not taking that.  3.  COPD:  Patient not using inhalers properly.  Discussed and labelled the Symbicort for regular twice daily use.  Will call in Albuterol once I know he does not have any at home for certain. Discussed the concept of a rescue inhaler vs. Maintenance inhaler with patient.  To brush teeth and tongue after using Symbicort twice daily.  Recommended using at same time as morning and evening medication dosing for ease in remembering.  4. Hyperlipidemia:  Continue Atorvastatin.  To take in the evening with his meal.  5.  Tobacco Abuse in patient already with complications of smoking:  Pt interested in quitting.  Went over use of nicotine patches and 1-800QUITNOW line.  Not sure if  he will be able to afford.  His wife does not smoke

## 2015-05-03 ENCOUNTER — Encounter: Payer: Self-pay | Admitting: Internal Medicine

## 2015-05-03 ENCOUNTER — Other Ambulatory Visit: Payer: Self-pay | Admitting: Internal Medicine

## 2015-05-03 MED ORDER — ALBUTEROL SULFATE HFA 108 (90 BASE) MCG/ACT IN AERS: INHALATION_SPRAY | RESPIRATORY_TRACT | Status: DC

## 2015-05-13 ENCOUNTER — Other Ambulatory Visit (INDEPENDENT_AMBULATORY_CARE_PROVIDER_SITE_OTHER): Payer: Self-pay | Admitting: Licensed Clinical Social Worker

## 2015-05-13 DIAGNOSIS — F439 Reaction to severe stress, unspecified: Secondary | ICD-10-CM

## 2015-05-13 DIAGNOSIS — Z658 Other specified problems related to psychosocial circumstances: Secondary | ICD-10-CM

## 2015-05-13 NOTE — Progress Notes (Signed)
Patient ID: Kwami Winkelman, male   DOB: Jun 23, 1930, 82 y.o.   MRN: [id number]   Health outreach team (LCSW Metta Clines and [facility] Worker Rob Bunting) visited Mr.and Mrs. Pasquarella in their home. CHW followed up with the Bobby Rumpf' regarding their health needs discussed in previous visit. CHW provided information regarding the inspection by [employer]. CHW refilled Mr. Shubert' medication organizer and noted that he has been taking his medications more regularly. Mrs. Rumpel shared that she had recently been to the emergency department and had been diagnosed with pneumonia. She requested LCSW support in contacting her home health agency to find out the exact duties of her home aide, who she feels is not completing her duties as she should.

## 2015-05-28 ENCOUNTER — Other Ambulatory Visit: Payer: Self-pay | Admitting: Internal Medicine

## 2015-05-28 DIAGNOSIS — J449 Chronic obstructive pulmonary disease, unspecified: Secondary | ICD-10-CM

## 2015-05-28 DIAGNOSIS — I10 Essential (primary) hypertension: Secondary | ICD-10-CM

## 2015-05-28 MED ORDER — METOPROLOL TARTRATE 25 MG PO TABS: 25.0000 mg | ORAL_TABLET | Freq: Two times a day (BID) | ORAL | Status: DC

## 2015-05-28 MED ORDER — ALBUTEROL SULFATE HFA 108 (90 BASE) MCG/ACT IN AERS: 2.0000 | INHALATION_SPRAY | Freq: Four times a day (QID) | RESPIRATORY_TRACT | Status: DC | PRN

## 2015-06-10 ENCOUNTER — Other Ambulatory Visit (INDEPENDENT_AMBULATORY_CARE_PROVIDER_SITE_OTHER): Payer: Self-pay | Admitting: Licensed Clinical Social Worker

## 2015-06-10 DIAGNOSIS — Z7189 Other specified counseling: Secondary | ICD-10-CM

## 2015-06-11 NOTE — Progress Notes (Signed)
Patient ID: [name], male   DOB: 06-May-1930, 82 y.o.   MRN: [id number]   Health outreach team met with [name] and his wife [name] in their home ([name], [place]; [name], [facility] Worker; [name], nurse).  LCSW spoke with pt and wife about their dental issues. LCSW provided wife with information regarding her dental benefits through [facility] insurance. LCSW informed pt that [facility] had shown that his policy was currently expired. Pt reported that one of his daughters, [name], helps him with insurance issues and probably knew what was going on. Pt requested LCSW assistance in coordinating with [name] and insurance.   Nurse took pt's vitals and checked in regarding health status. Nurse asked pt about childcare in reference to his grandson who lives in the home. Pt and wife shared that they do not watch their grandson on their own. Pt reported that grandson is possibly autistic and is currently receiving supportive services.  Pt and wife reported that they had begun cleaning the house in preparation for [employer] to treat for pests.

## 2015-06-17 ENCOUNTER — Ambulatory Visit: Payer: Self-pay | Admitting: Internal Medicine

## 2015-06-17 ENCOUNTER — Telehealth: Payer: Self-pay | Admitting: Internal Medicine

## 2015-06-17 ENCOUNTER — Ambulatory Visit (INDEPENDENT_AMBULATORY_CARE_PROVIDER_SITE_OTHER): Payer: Self-pay | Admitting: Internal Medicine

## 2015-06-17 ENCOUNTER — Encounter: Payer: Self-pay | Admitting: Internal Medicine

## 2015-06-17 VITALS — BP 184/80 | HR 60 | Resp 18 | Ht 68.0 in | Wt 139.0 lb

## 2015-06-17 DIAGNOSIS — I25118 Atherosclerotic heart disease of native coronary artery with other forms of angina pectoris: Secondary | ICD-10-CM

## 2015-06-17 DIAGNOSIS — I10 Essential (primary) hypertension: Secondary | ICD-10-CM

## 2015-06-17 DIAGNOSIS — C180 Malignant neoplasm of cecum: Secondary | ICD-10-CM

## 2015-06-17 DIAGNOSIS — J449 Chronic obstructive pulmonary disease, unspecified: Secondary | ICD-10-CM

## 2015-06-17 NOTE — Patient Instructions (Signed)
You needs a bp check at home when you have not missed your bp medication Bring your medication into each visit.

## 2015-06-17 NOTE — Progress Notes (Signed)
Subjective:    Patient ID: [name], male    DOB: [birth year], 82 y.o.   MRN: [id number]  HPI  Not fasting today.  1.  Essential Hypertension:  BPs have been better with HOT when done in home.  Rob Bunting, CHW, has been filling up his pillbox weekly.  Will need to call her for update on how he is doing with taking meds.  She reportedly is trying to get the young woman who often stays with the [employer] to learn how to fill.  States he rarely misses a pill now. Did not have time to take his morning pills this morning before coming in.  2.  COPD:  Has not needed rescue inhaler since last here, but still not using Symbicort as we have discussed in past.  Does get outside and move around. Pt. Living in a home with roach infestation.  They are working on cleaning the home to have it treated for insects.  States it is going well.  3.  Poor diet:  Because patient drives, they are unable to get him meals on wheels, though his wife does.  He also states he has a broken off tooth both sides of mouth and gum soreness, so does not like to chew.  HOT reported his insurance covers dental, but he does not believe so--states it was cut out of his coverage a couple years ago.  Drinking lots of Pepsi and Ensure.    4.  CAD: No chest pain recently, No swelling of ankle, dyspnea.  No PND or orthopnea.  Has not used SL NTG in more than a month.  5.  Hyperlipidemia:  Taking Atorvastatin.   6.  Tobacco Abuse:  Did not get nicotine patches, though I thought he did per HOT.  Will need to check with Rob Bunting, CHW.  7.  Hx of Prostate Cancer:  Followed by a new urologist at [facility].  Dx when 82 yo.  Sees him every 6 months.  8.  Hx of Colon Cancer: Followed by Dr. Noah Delaine.  Sees him once yearly. Unable to find a colonoscopy report in EMD.  Was seen by Dr. Delfin Edis in past--last in 2008.  Current Outpatient Prescriptions on File Prior to Visit  Medication Sig Dispense Refill  . amLODipine  (NORVASC) 10 MG tablet Take 10 mg by mouth daily.    Marland Kitchen aspirin 81 MG tablet Take 81 mg by mouth daily.    Marland Kitchen atorvastatin (LIPITOR) 40 MG tablet TAKE 1 TABLET BY MOUTH EVERY DAY 30 tablet 3  . furosemide (LASIX) 20 MG tablet TAKE 1 TABLET BY MOUTH EVERY DAY 30 tablet 3  . hydrALAZINE (APRESOLINE) 50 MG tablet Take 1 tablet (50 mg total) by mouth 2 (two) times daily. (Patient taking differently: Take 100 mg by mouth 2 (two) times daily. ) 60 tablet 5  . losartan (COZAAR) 100 MG tablet Take 100 mg by mouth daily.  11  . metoprolol tartrate (LOPRESSOR) 25 MG tablet Take 1 tablet (25 mg total) by mouth 2 (two) times daily. 60 tablet 11  . SYMBICORT 160-4.5 MCG/ACT inhaler INHALE 2 PUFFS BY MOUTH TWICE DAILY 10.2 g 0  . albuterol (PROAIR HFA) 108 (90 Base) MCG/ACT inhaler Inhale 2 puffs into the lungs every 6 (six) hours as needed for wheezing or shortness of breath. (Patient not taking: Reported on 06/17/2015) 1 Inhaler 1  . benzonatate (TESSALON) 100 MG capsule Take 100 mg by mouth 3 (three) times daily as needed for cough.  Reported on 06/17/2015    . IRON PO Take 1 tablet by mouth at bedtime. Reported on 04/19/2015    . isosorbide mononitrate (IMDUR) 60 MG 24 hr tablet Take 1 tablet (60 mg total) by mouth daily. (Patient not taking: Reported on 02/01/2015) 30 tablet 11  . nicotine (NICODERM CQ - DOSED IN MG/24 HOURS) 14 mg/24hr patch After finishing 21 mg patches, start 14 mg patches to skin, change every 24 hours for 14 days, then change to 7 mg patches 14 patch 0  . nicotine (NICODERM CQ - DOSED IN MG/24 HOURS) 21 mg/24hr patch Start with 21 mg patches.  Place 1 patch on skin and change to different area of skin every 24 hours.Use 21 mg patches for 28 days, then move to 14 mg patches 28 patch 0  . nicotine (NICODERM CQ - DOSED IN MG/24 HR) 7 mg/24hr patch After finishing 14 mg patches, move to 7 mg patches and change to skin every 24 hours for 14 days, then stop patches 14 patch 0  . NITROSTAT 0.4 MG SL  tablet PLACE 1 TABLET UNDER THE TONGUE EVERY 5 MINUTES AS NEEDED FOR CHEST PAIN 25 tablet 3  . Spacer/Aero-Holding Chambers (AEROCHAMBER PLUS FLO-VU MEDIUM) MISC 1 each by Other route once. (Patient not taking: Reported on 04/19/2015) 1 each 0   No current facility-administered medications on file prior to visit.    No Known Allergies  Past Medical History  Diagnosis Date  . Hypertension   . Anemia   . Coronary artery disease 11/2006, 02/10/2014    a. hx STEMI s/p PCI with BMS to RCA b. 02/12/2014, 3 v disease, largely nonobstructive, moderate disease in diag which is small. Medical therapy.  . 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 Prostate cancer ([place])     s/p intensity modulated radiation therapy   Past Surgical History  Procedure Laterality Date  . Cholecystectomy  02/06/07  . Hemicolectomy Right 02/06/07    secondary to adenocarcinoma right colon  . Cardiac catheterization  02/10/2014    a. hx STEMI s/p PCI with BMS to RCA b. 02/12/2014, 3 v disease, largely nonobstructive, moderate disease in diag which is small. Medical therapy.  . Left heart catheterization with coronary angiogram N/A 02/12/2014    Procedure: LEFT HEART CATHETERIZATION WITH CORONARY ANGIOGRAM;  Surgeon: Burnell Blanks, MD;  Location: [facility] CATH LAB;  [name]: Cardiovascular;  Laterality: N/A;   Social History   Social History  . Marital Status: Married    Spouse Name: Mabel  . Number of Children: 7  . Years of Education: 11.5   Occupational History  . Retired from [employer].    Social History Main Topics  . Smoking status: Current Some Day Smoker -- 0.50 packs/day    Types: Cigarettes    Start date: 10/14/1956  . Smokeless tobacco: Never Used     Comment: Working on quitting--trying to gradually cut back. Wife smokes as well.  . Alcohol Use: No  . Drug Use: No  . Sexual Activity: Not on file   Other Topics Concern  . Not on file   Social  History Narrative   [place] here about 50 years ago.   Divorced from first wife, who died 94 years ago as well.   Married current wife, Cori Razor, about 30 years ago.   Lives with his wife.     Have  raised several children not biologically theirs--they and their families are in and out of their home.      Review of Systems     Objective:   Physical Exam  HEENT:  PERRL, EOMI,, bilateral arcus senilis, TMs pearly gray, throat without injection Neck:  Supple, no adenopathy or thyromegaly Chest:  CTA CV:  RRR with normal S1 and S2, No S3, S4 or murmur, radial and DP pulses normal and equal Abd:  S, NT, NO HSM or mass, +BS LE:  No edema       Assessment & Plan:  1.  Essential Hypertension:  Concerned not using pillbox regularly as missed med today.  Check with CHW and HO team.  Will need home visit again.  2.  Dental Decay:  Check on dentist   3.  History of colon CA:  Check on GI follow up--?[facility]  4.   CAD:  Stable  5.  COPD:  Sounds stable, but would like for him to utilize maintenance inhaler, Symbicort, regularly.  Again, discussed with patient and will have HO team check on home meds.  6.  Tobacco Abuse:  Does not seem ready yet to quit

## 2015-06-17 NOTE — Telephone Encounter (Signed)
Rob Bunting called and wants Dr. Amil Amen to know that patient took Nitroglycerin tablets for chest pains (last week).  Ms. Belenda Cruise wanted you to know this just in case patient did not tell the Dr.

## 2015-06-18 ENCOUNTER — Ambulatory Visit: Payer: Self-pay | Admitting: Internal Medicine

## 2015-06-21 ENCOUNTER — Other Ambulatory Visit: Payer: Self-pay | Admitting: *Deleted

## 2015-06-21 MED ORDER — NITROGLYCERIN 0.4 MG SL SUBL: 0.4000 mg | SUBLINGUAL_TABLET | SUBLINGUAL | Status: DC | PRN

## 2015-07-04 ENCOUNTER — Other Ambulatory Visit (INDEPENDENT_AMBULATORY_CARE_PROVIDER_SITE_OTHER): Payer: Self-pay | Admitting: Internal Medicine

## 2015-07-04 ENCOUNTER — Encounter: Payer: Self-pay | Admitting: Internal Medicine

## 2015-07-04 DIAGNOSIS — I10 Essential (primary) hypertension: Secondary | ICD-10-CM

## 2015-07-04 MED ORDER — ATORVASTATIN CALCIUM 40 MG PO TABS: ORAL_TABLET | ORAL | Status: DC

## 2015-07-04 MED ORDER — BUDESONIDE-FORMOTEROL FUMARATE 160-4.5 MCG/ACT IN AERO: 2.0000 | INHALATION_SPRAY | Freq: Two times a day (BID) | RESPIRATORY_TRACT | Status: DC

## 2015-07-04 MED ORDER — HYDRALAZINE HCL 100 MG PO TABS: 100.0000 mg | ORAL_TABLET | Freq: Two times a day (BID) | ORAL | Status: DC

## 2015-07-04 MED ORDER — LOSARTAN POTASSIUM 100 MG PO TABS: ORAL_TABLET | ORAL | Status: DC

## 2015-07-04 MED ORDER — METOPROLOL TARTRATE 25 MG PO TABS: 25.0000 mg | ORAL_TABLET | Freq: Two times a day (BID) | ORAL | Status: DC

## 2015-07-04 MED ORDER — FUROSEMIDE 20 MG PO TABS: ORAL_TABLET | ORAL | Status: DC

## 2015-07-04 MED ORDER — AMLODIPINE BESYLATE 10 MG PO TABS: ORAL_TABLET | ORAL | Status: DC

## 2015-07-04 NOTE — Progress Notes (Signed)
Patient ID: [name], male   DOB: [birth year], 82 y.o.   MRN: [id number] Home visit today, planning to recheck bp with idea patient is taking meds regularly. Upon arrival, patient's pill box is empty, and the lids to Saturday are missing.  He states he took his morning meds, but no other spot in the box is filled.   Amlodipine, Atorvastatin, Furosemide, Losartan are all dated 05/27/2015 which should have been finished about 1 week ago. Hydralazine 100 mg twice daily is only being taken once daily in the evening and is dated 04/19/2015--filled pill box once daily through Sunday. Metoprolol is dated 04/17/2015 with many left Arthro 7, which is otc and contains MSM and collagen he reportedly takes twice daily. He also has ASA--two bottles in his shoebox, one states PM, the other, AM.  I told him to just take in the morning.  He has no idea whether he was taking once or twice daily He does have his Symbicort, but not clear when he last filled--feels empty. Unable to fill Saturday as he does not have lids for these. I did not have my computer with me to access what time of day to take Amlodipine vs.  Losartan and put the  Amlodipine in the morning and Losartan in the evening (later found out this is the opposite of what we were doing, but just want to keep them at opposite ends of the day. I have a call into CHW, Rob Bunting to see if we can get this corrected again. He will likely get another pill box.  Called and spoke with [employer], [employer].  He missed filling a number of meds in February, he also is shown to have filled Metoprolol on 06/06/2015, but I did not see that bottle.  This still would have been 3 weeks late in filling, missing the month of March. Will discuss concerns with Rob Bunting and HO team.

## 2015-07-08 ENCOUNTER — Encounter: Payer: Self-pay | Admitting: Internal Medicine

## 2015-07-17 ENCOUNTER — Other Ambulatory Visit: Payer: Self-pay | Admitting: Internal Medicine

## 2015-07-17 NOTE — Progress Notes (Signed)
   Subjective:    Patient ID: [name], male    DOB: [birth year], 82 y.o.   MRN: [id number]  HPI    Review of Systems     Objective:   Physical Exam        Assessment & Plan:  Essential Hypertension:  Gave medication list to Glen Mull, RN, Head of Health Outreach Team.  She and CHW [name] working with [name] on learning how to fill and use pill box.  BP much improved from when I saw him last.

## 2015-07-29 ENCOUNTER — Other Ambulatory Visit: Payer: Medicare Other

## 2015-07-29 ENCOUNTER — Other Ambulatory Visit: Payer: Self-pay | Admitting: Internal Medicine

## 2015-07-29 ENCOUNTER — Other Ambulatory Visit: Payer: Self-pay

## 2015-07-29 DIAGNOSIS — R7303 Prediabetes: Secondary | ICD-10-CM | POA: Insufficient documentation

## 2015-07-29 DIAGNOSIS — N189 Chronic kidney disease, unspecified: Secondary | ICD-10-CM

## 2015-07-29 DIAGNOSIS — R7301 Impaired fasting glucose: Secondary | ICD-10-CM

## 2015-07-29 DIAGNOSIS — E785 Hyperlipidemia, unspecified: Secondary | ICD-10-CM

## 2015-07-30 LAB — SPECIMEN STATUS

## 2015-07-31 ENCOUNTER — Other Ambulatory Visit: Payer: Self-pay | Admitting: Internal Medicine

## 2015-07-31 ENCOUNTER — Ambulatory Visit: Payer: Medicare Other

## 2015-07-31 VITALS — Ht 68.0 in | Wt 138.0 lb

## 2015-07-31 DIAGNOSIS — I10 Essential (primary) hypertension: Secondary | ICD-10-CM

## 2015-07-31 MED ORDER — AMLODIPINE BESYLATE 10 MG PO TABS: ORAL_TABLET | ORAL | Status: DC

## 2015-07-31 MED ORDER — LOSARTAN POTASSIUM 100 MG PO TABS: ORAL_TABLET | ORAL | Status: DC

## 2015-07-31 NOTE — Addendum Note (Signed)
Addended by: Marcelino Duster on: 07/31/2015 03:51 PM   Modules accepted: Miquel Dunn

## 2015-08-02 LAB — HGB A1C W/O EAG: HEMOGLOBIN A1C: 5.8 % — AB (ref 4.8–5.6)

## 2015-08-02 LAB — SPECIMEN STATUS REPORT

## 2015-08-09 ENCOUNTER — Other Ambulatory Visit: Payer: Self-pay | Admitting: Urology

## 2015-08-09 DIAGNOSIS — C61 Malignant neoplasm of prostate: Secondary | ICD-10-CM

## 2015-08-12 LAB — COMPREHENSIVE METABOLIC PANEL
A/G RATIO: 1 — AB (ref 1.2–2.2)
ALT: 7 IU/L (ref 0–44)
AST: 16 IU/L (ref 0–40)
Albumin: 3.9 g/dL (ref 3.5–4.7)
Alkaline Phosphatase: 92 IU/L (ref 39–117)
BILIRUBIN TOTAL: 0.5 mg/dL (ref 0.0–1.2)
BUN/Creatinine Ratio: 12 (ref 10–24)
BUN: 23 mg/dL (ref 8–27)
CHLORIDE: 98 mmol/L (ref 96–106)
CO2: 19 mmol/L (ref 18–29)
Calcium: 10.6 mg/dL — ABNORMAL HIGH (ref 8.6–10.2)
Creatinine, Ser: 1.9 mg/dL — ABNORMAL HIGH (ref 0.76–1.27)
GFR calc non Af Amer: 32 mL/min/{1.73_m2} — ABNORMAL LOW (ref >59)
GFR, EST AFRICAN AMERICAN: 37 mL/min/{1.73_m2} — AB (ref >59)
GLOBULIN, TOTAL: 3.8 g/dL (ref 1.5–4.5)
Glucose: 92 mg/dL (ref 65–99)
POTASSIUM: 4.7 mmol/L (ref 3.5–5.2)
SODIUM: 142 mmol/L (ref 134–144)
TOTAL PROTEIN: 7.7 g/dL (ref 6.0–8.5)

## 2015-08-12 LAB — LP+NON-HDL CHOLESTEROL
CHOLESTEROL TOTAL: 115 mg/dL (ref 100–199)
HDL: 49 mg/dL (ref >39)
LDL CALC: 54 mg/dL (ref 0–99)
Total Non-HDL-Chol (LDL+VLDL): 66 mg/dL (ref 0–129)
Triglycerides: 60 mg/dL (ref 0–149)
VLDL CHOLESTEROL CAL: 12 mg/dL (ref 5–40)

## 2015-08-12 NOTE — Progress Notes (Signed)
Result found in system.

## 2015-08-20 ENCOUNTER — Encounter (HOSPITAL_COMMUNITY)
Admission: RE | Admit: 2015-08-20 | Discharge: 2015-08-20 | Disposition: A | Discharge status: Home / Self Care | Payer: Medicare Other | Source: Ambulatory Visit | Attending: Urology | Admitting: Urology

## 2015-08-20 ENCOUNTER — Ambulatory Visit (HOSPITAL_COMMUNITY)
Admission: RE | Admit: 2015-08-20 | Discharge: 2015-08-20 | Disposition: A | Discharge status: Home / Self Care | Payer: Medicare Other | Source: Ambulatory Visit | Attending: Urology | Admitting: Urology

## 2015-08-20 DIAGNOSIS — C61 Malignant neoplasm of prostate: Secondary | ICD-10-CM | POA: Diagnosis present

## 2015-08-20 DIAGNOSIS — R938 Abnormal findings on diagnostic imaging of other specified body structures: Secondary | ICD-10-CM | POA: Diagnosis not present

## 2015-08-20 MED ORDER — TECHNETIUM TC 99M MEDRONATE IV KIT
27.1000 | PACK | Freq: Once | INTRAVENOUS | Status: AC | PRN
  Administered 2015-08-20: 27.1 via INTRAVENOUS

## 2015-08-28 MED ORDER — BUDESONIDE-FORMOTEROL FUMARATE 80-4.5 MCG/ACT IN AERO: 1.0000 | INHALATION_SPRAY | Freq: Two times a day (BID) | RESPIRATORY_TRACT | Status: DC

## 2015-08-28 NOTE — Telephone Encounter (Signed)
Request form HOT leader, MSN Corky Mull, for liquid nutrition as pt. Not able to eat well due to dental issues.  Pt does have dx of COPD, so will go with [employer] and see if covered--checked and not covered.  Will recommend he drink [employer] in whole milk twice daily. Also, is not using Symbicort as gets a hoarse voice even with 1 puff twice daily

## 2015-08-30 ENCOUNTER — Encounter: Payer: Self-pay | Admitting: Internal Medicine

## 2015-09-01 ENCOUNTER — Other Ambulatory Visit: Payer: Self-pay | Admitting: Interventional Cardiology

## 2015-09-16 ENCOUNTER — Ambulatory Visit (INDEPENDENT_AMBULATORY_CARE_PROVIDER_SITE_OTHER): Payer: Medicare Other | Admitting: Internal Medicine

## 2015-09-16 ENCOUNTER — Encounter: Payer: Self-pay | Admitting: Internal Medicine

## 2015-09-16 VITALS — BP 160/62 | HR 54 | Resp 18 | Ht 68.0 in | Wt 138.0 lb

## 2015-09-16 DIAGNOSIS — I10 Essential (primary) hypertension: Secondary | ICD-10-CM

## 2015-09-16 DIAGNOSIS — R7303 Prediabetes: Secondary | ICD-10-CM

## 2015-09-16 DIAGNOSIS — I25810 Atherosclerosis of coronary artery bypass graft(s) without angina pectoris: Secondary | ICD-10-CM

## 2015-09-16 DIAGNOSIS — Z85038 Personal history of other malignant neoplasm of large intestine: Secondary | ICD-10-CM

## 2015-09-16 DIAGNOSIS — R260 Ataxic gait: Secondary | ICD-10-CM

## 2015-09-16 DIAGNOSIS — Z8546 Personal history of malignant neoplasm of prostate: Secondary | ICD-10-CM

## 2015-09-16 NOTE — Progress Notes (Signed)
Subjective:    Patient ID: [name], male    DOB: [birth year], 82 y.o.   MRN: [id number]  HPI   1.  Prostate Cancer and Colon Cancer:  Unable to get from his Epic chart what his follow up is for his colon cancer and prostate cancer.  Did have a bone scan recently that appeared normal--had some uptake on tibial plateau, which unlikely to be a single metastasis.   Not sure he is a good candidate for colonoscopy.  Appears his last one was in 2008 with Dr. Amedeo Plenty, then saw Dr. Olevia Perches at one point too before EPIC initiated.  Pt. Unable to give me a good history regarding this--seems to think he only has had prostate cancer. In colonoscopy report from 2008, pt. Was evaluated by Dr. St[facility]--I have a message to them to have him call me when he is back in town.    2  Essential Hypertension:  States he is taking all his meds regularly.  Getting his pill box set up regularly by his "daughter"  [name] or "Mert"  Did not bring in his pill box.  His blood pressures have been running a bit lower ([id number] systolic), but not at goal yet  3. Imbalance:  He states he feels this is about the same as it has been since a "light stroke" in 2012, but unable to find documentation of this light stroke and no history of brain imaging.  Using a single pronged can when out and about. Later, pt. 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  Denies vertigo.  He states it was not from his ear.  Denies knowledge of who the doctor was.   Called and spoke with Dr. Daneen Schick, his cardiologist, who states at one point, his primary was Dr. Noah Delaine.    4.  CAD:  Denies chest pain.  Has not used Imdur since before I began following him end of last year.  Has not had chest pain.  Has sublingual Ntg, but has not needed to use.  No swelling of ankle, dyspnea, No orthopnea or PND.  5.  Prediabetes.  Stopped drinking sodas so often.  Is trying to eat healthier  6.   Tobacco Abuse:  Has the patches.  Hesitant to start.      Review of Systems     Objective:   Physical Exam HEENT:  Scar on right temporal-parietal scalp--somewhat difficult to see under hair.  PERRL, EOMI, throat without injection. Neck:  Supple, no adenopathy Chest:  CTA CV:  RRR with normal S1 and S2, No S3, S4 or murmur.  No JVD, No carotid bruits, Carotid, radial, DP pulses normal and equal Abd:  S, NT, No HSM or mass, + BS throughout. LE: No edema Neuro:  A & O x 3, CN II-XII grossly intact.  Motor 5/5, DTRs 2+/4.  Right fingers with flexor position and hand with muscle atrophy with scar across volar wrist.   Appears to have almost truncal coodination difficulties when upright and walking, but also past pointing somewhat with finger to nose to finger, heel to shin also not smooth.  Ataxic gait is his most predominant finding.   Romberg negative/normal.       Assessment & Plan:  1.  Coordination/ataxia issues appear to be more of an issue for the patient today, though he feels this is no worse than before.   I am unable to find his history  of brain tumor and stroke in his Epic records as these predate the records.   Called and spoke with Dr. Delton Prairie send for records with Dr Laren Everts, who apparently was hi primar care in past.   Once have appropriate info, may consider MRI of brain and PT/OT with neuro rehab--patient unclear as to what has been done before.  Will also need to address his driving at follow up.  This was brought up at [facility] discussion.   2.  Essential Hypertension:  Seems to be taking meds regularly now and bp still high.  Restart Imdur.  Discussed with patient.  HOT nurse will come by in 1 week for bp check.  3.  History of cecal cancer:  Need records from [facility]/ Dr. Amedeo Plenty regarding this.  Called to speak with him, but out of town.  Sending release of information to get records and see what his follow up has been as pt. Cannot recall.  4.   History of Prostate Cancer:  Recently underwent bone scan per Dr. Alyson Ingles, Urology, but no note in chart.  Perhaps uses a different EMR than [facility].  Sending for records as well.  5.  Prediabetes:  Discussed that he is doing better with his diet, particularly with soda and sweet drinks.   6.  Tobacco Abuse:  States he has the nicotine patches.  Will have [facility] work with him getting started with use.   7.  CAD:  No symptoms, but need to improve risk factors for repeat injury.  Treat bp and try to get off cigarettes.  Cholesterol recently ok.

## 2015-09-16 NOTE — Patient Instructions (Signed)
Drink a glass of water before every meal Drink 6-8 glasses of water daily Eat three meals daily Eat a protein and healthy fat with every meal (eggs,fish, chicken, turkey and limit red meats) Eat 5 servings of vegetables daily, mix the colors Eat 2 servings of fruit daily with skin, if skin is edible Use smaller plates Put food/utensils down as you chew and swallow each bite Eat at a table with friends/family at least once daily, no TV Do not eat in front of the TV 

## 2015-09-18 ENCOUNTER — Telehealth: Payer: Self-pay | Admitting: Internal Medicine

## 2015-09-18 ENCOUNTER — Encounter: Payer: Self-pay | Admitting: Internal Medicine

## 2015-09-18 DIAGNOSIS — Z8546 Personal history of malignant neoplasm of prostate: Secondary | ICD-10-CM | POA: Insufficient documentation

## 2015-09-18 DIAGNOSIS — R260 Ataxic gait: Secondary | ICD-10-CM | POA: Insufficient documentation

## 2015-09-18 MED ORDER — ISOSORBIDE MONONITRATE ER 60 MG PO TB24: 60.0000 mg | ORAL_TABLET | Freq: Every day | ORAL | Status: DC

## 2015-09-18 NOTE — Telephone Encounter (Signed)
Attempted to reach patient but was hung up on. Left message with Corky Mull to call back to get the message as well.

## 2015-09-24 NOTE — Telephone Encounter (Signed)
Unable to reach patient by phone. [name] will discuss this with him at home visit tomorrow.

## 2015-09-24 NOTE — Telephone Encounter (Signed)
Unable to reach patient today.

## 2015-09-30 ENCOUNTER — Encounter: Payer: Self-pay | Admitting: Internal Medicine

## 2015-09-30 DIAGNOSIS — Z85038 Personal history of other malignant neoplasm of large intestine: Secondary | ICD-10-CM

## 2015-09-30 DIAGNOSIS — Z8546 Personal history of malignant neoplasm of prostate: Secondary | ICD-10-CM

## 2015-10-02 ENCOUNTER — Other Ambulatory Visit: Payer: Self-pay | Admitting: Internal Medicine

## 2015-10-02 ENCOUNTER — Telehealth: Payer: Self-pay | Admitting: Internal Medicine

## 2015-10-02 DIAGNOSIS — R270 Ataxia, unspecified: Secondary | ICD-10-CM

## 2015-10-02 DIAGNOSIS — G119 Hereditary ataxia, unspecified: Secondary | ICD-10-CM

## 2015-10-02 NOTE — Telephone Encounter (Signed)
No answer from patient on first attempt.

## 2015-10-15 NOTE — Telephone Encounter (Signed)
Patient and Rob Bunting informed of appointment date and time.

## 2015-10-24 ENCOUNTER — Ambulatory Visit: Payer: Medicare Other | Attending: Internal Medicine | Admitting: Rehabilitation

## 2015-10-24 ENCOUNTER — Encounter: Payer: Self-pay | Admitting: Rehabilitation

## 2015-10-24 DIAGNOSIS — R2681 Unsteadiness on feet: Secondary | ICD-10-CM | POA: Insufficient documentation

## 2015-10-24 DIAGNOSIS — R2689 Other abnormalities of gait and mobility: Secondary | ICD-10-CM | POA: Diagnosis present

## 2015-10-24 DIAGNOSIS — M6281 Muscle weakness (generalized): Secondary | ICD-10-CM

## 2015-10-24 DIAGNOSIS — R279 Unspecified lack of coordination: Secondary | ICD-10-CM | POA: Diagnosis present
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
## 2015-10-28 ENCOUNTER — Ambulatory Visit: Payer: Medicare Other | Admitting: Physical Therapy

## 2015-10-28 DIAGNOSIS — R2689 Other abnormalities of gait and mobility: Secondary | ICD-10-CM

## 2015-10-28 DIAGNOSIS — R2681 Unsteadiness on feet: Secondary | ICD-10-CM

## 2015-10-28 DIAGNOSIS — R279 Unspecified lack of coordination: Secondary | ICD-10-CM

## 2015-10-28 DIAGNOSIS — M6281 Muscle weakness (generalized): Secondary | ICD-10-CM

## 2015-10-28 NOTE — Patient Instructions (Addendum)
    HAMSTRING STRETCH - TABLE, BED OR COUCH  Sit on a raised flat surface where you can prop your affected leg up on it such as a treatment table, couch or bed.    While keeping your knee straight to slightly bent, slowly lean forward and reach your hands towards your foot until a gentle stretch is felt along the back of your knee/thigh. Hold and then return to starting position and repeat.  Hold for 45 seconds, 3-4 times per day on each leg.   Hip Flexion / Knee Extension: Straight-Leg Raise (Eccentric)    Lie on back. Bent left knee. Keep right knee straight. Lift RIGHT leg with knee straight. Slowly lower leg for 3-5 seconds, making sure the back of your knee and the back of your heel touch the bed at the same time. _15__ reps per set, _2-3__ sets per day on each leg.  Copyright  VHI. All rights reserved.   Knee Extension: Sit to Stand (Eccentric)    Sit in a stable chair with your walker directly in front of you. Scoot to edge of seat and lean forward to stand without using hands. Then, slowly lower self to seated position. _10__ reps per set, _2-3__ sets per day.  Copyright  VHI. All rights reserved.
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
## 2015-10-31 ENCOUNTER — Ambulatory Visit: Payer: Medicare Other | Admitting: Rehabilitation

## 2015-10-31 ENCOUNTER — Encounter: Payer: Self-pay | Admitting: Rehabilitation

## 2015-10-31 DIAGNOSIS — R279 Unspecified lack of coordination: Secondary | ICD-10-CM

## 2015-10-31 DIAGNOSIS — R2681 Unsteadiness on feet: Secondary | ICD-10-CM | POA: Diagnosis not present

## 2015-10-31 DIAGNOSIS — R2689 Other abnormalities of gait and mobility: Secondary | ICD-10-CM

## 2015-10-31 DIAGNOSIS — M6281 Muscle weakness (generalized): Secondary | ICD-10-CM
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
## 2015-10-31 NOTE — Patient Instructions (Addendum)
   HAMSTRING STRETCH - TABLE, BED OR COUCH  Sit on a raised flat surface where you can prop your affected leg up on it such as a treatment table, couch or bed.    While keeping your knee straight to slightly bent, slowly lean forward and reach your hands towards your foot until a gentle stretch is felt along the back of your knee/thigh. Hold and then return to starting position and repeat.  Hold for 45 seconds, 3-4 times per day on each leg.   Hip Flexion / Knee Extension: Straight-Leg Raise (Eccentric)    Lie on back. Bent left knee. Keep right knee straight. Lift RIGHT leg with knee straight. Slowly lower leg for 3-5 seconds, making sure the back of your knee and the back of your heel touch the bed at the same time. _15__ reps per set, _2-3__ sets per day on each leg.  Copyright  VHI. All rights reserved.   Knee Extension: Sit to Stand (Eccentric)    Sit in a stable chair with your walker directly in front of you. Scoot to edge of seat and lean forward to stand without using hands. Then, slowly lower self to seated position. _10__ reps per set, _2-3__ sets per day.  Copyright  VHI. All rights reserved.   For these balance exercises:  Stand in corner and have chair in front of you for support in case you need it.    Feet Together, Arm Motion - Eyes Closed    With eyes closed and feet together, Keep arms by your side.  Hold this position x 30 seconds.   Repeat __3__ times per session. Do __2__ sessions per day.  Copyright  VHI. All rights reserved.   Feet Together, Head Motion - Eyes Open    With eyes open, feet together, move head slowly: up and down x 10 reps, side to side x 10 reps.  Repeat __1__ times per session. Do __2__ sessions per day.  Copyright  VHI. All rights reserved.   Feet Apart (Compliant Surface) Arm Motion - Eyes Closed    Stand on compliant surface: ___pillow or cushion_____, feet shoulder width apart. Close eyes and keep arms by your  side.  Hold for 30 seconds.   Repeat __3__ times per session. Do __2__ sessions per day.  Copyright  VHI. All rights reserved.      Hip Flexor Stretch    Lying on back near edge of bed, bend one leg, foot flat. Hang other leg over edge, relaxed, thigh resting entirely on bed for __2-3__ minutes. Repeat __2__ times on each leg. Do __2-3__ sessions per day. Advanced Exercise: Bend knee back keeping thigh in contact with bed.  http://[email or web address].exer.us/346   Copyright  VHI. All rights reserved.

## 2015-11-04 ENCOUNTER — Ambulatory Visit: Payer: Medicare Other | Admitting: Physical Therapy

## 2015-11-07 ENCOUNTER — Ambulatory Visit: Payer: Medicare Other | Admitting: Rehabilitation

## 2015-11-07 ENCOUNTER — Encounter: Payer: Self-pay | Admitting: Rehabilitation

## 2015-11-07 DIAGNOSIS — R2681 Unsteadiness on feet: Secondary | ICD-10-CM | POA: Diagnosis not present

## 2015-11-07 DIAGNOSIS — R2689 Other abnormalities of gait and mobility: Secondary | ICD-10-CM

## 2015-11-07 DIAGNOSIS — M6281 Muscle weakness (generalized): Secondary | ICD-10-CM

## 2015-11-07 DIAGNOSIS — R279 Unspecified lack of coordination: Secondary | ICD-10-CM
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
## 2015-11-13 ENCOUNTER — Ambulatory Visit: Payer: Medicare Other | Attending: Internal Medicine | Admitting: Physical Therapy

## 2015-11-13 ENCOUNTER — Encounter: Payer: Self-pay | Admitting: Physical Therapy

## 2015-11-13 DIAGNOSIS — R2689 Other abnormalities of gait and mobility: Secondary | ICD-10-CM | POA: Insufficient documentation

## 2015-11-13 DIAGNOSIS — R279 Unspecified lack of coordination: Secondary | ICD-10-CM | POA: Diagnosis present

## 2015-11-13 DIAGNOSIS — R2681 Unsteadiness on feet: Secondary | ICD-10-CM | POA: Diagnosis present

## 2015-11-13 DIAGNOSIS — M6281 Muscle weakness (generalized): Secondary | ICD-10-CM
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
## 2015-11-15 ENCOUNTER — Ambulatory Visit: Payer: Medicare Other | Admitting: Rehabilitation

## 2015-11-15 ENCOUNTER — Encounter: Payer: Self-pay | Admitting: Rehabilitation

## 2015-11-15 DIAGNOSIS — R279 Unspecified lack of coordination: Secondary | ICD-10-CM

## 2015-11-15 DIAGNOSIS — R2681 Unsteadiness on feet: Secondary | ICD-10-CM

## 2015-11-15 DIAGNOSIS — M6281 Muscle weakness (generalized): Secondary | ICD-10-CM

## 2015-11-15 DIAGNOSIS — R2689 Other abnormalities of gait and mobility: Secondary | ICD-10-CM
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
## 2015-11-18 ENCOUNTER — Ambulatory Visit: Payer: Medicare Other | Admitting: Rehabilitation

## 2015-11-21 ENCOUNTER — Encounter: Payer: Self-pay | Admitting: Rehabilitation

## 2015-11-21 ENCOUNTER — Ambulatory Visit: Payer: Medicare Other | Admitting: Rehabilitation

## 2015-11-21 DIAGNOSIS — R2681 Unsteadiness on feet: Secondary | ICD-10-CM

## 2015-11-21 DIAGNOSIS — R279 Unspecified lack of coordination: Secondary | ICD-10-CM

## 2015-11-21 DIAGNOSIS — R2689 Other abnormalities of gait and mobility: Secondary | ICD-10-CM

## 2015-11-21 DIAGNOSIS — M6281 Muscle weakness (generalized): Secondary | ICD-10-CM
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
## 2015-11-21 NOTE — Patient Instructions (Signed)
Adductor Stretch - Supine    Lie on floor or bed, knees bent, feet flat. Keeping feet together, lower knees toward floor until stretch felt at inner thighs. Repeat _3__ times. Do _2-3__ times per day.  Hold for 1 min each time.    Copyright  VHI. All rights reserved.

## 2015-11-26 ENCOUNTER — Encounter: Payer: Self-pay | Admitting: Physical Therapy

## 2015-11-26 ENCOUNTER — Ambulatory Visit: Payer: Medicare Other | Admitting: Physical Therapy

## 2015-11-26 DIAGNOSIS — M6281 Muscle weakness (generalized): Secondary | ICD-10-CM

## 2015-11-26 DIAGNOSIS — R2681 Unsteadiness on feet: Secondary | ICD-10-CM

## 2015-11-26 DIAGNOSIS — R279 Unspecified lack of coordination: Secondary | ICD-10-CM

## 2015-11-26 DIAGNOSIS — R2689 Other abnormalities of gait and mobility: Secondary | ICD-10-CM
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
## 2015-11-29 ENCOUNTER — Ambulatory Visit: Payer: Medicare Other | Admitting: Physical Therapy

## 2015-11-29 DIAGNOSIS — M6281 Muscle weakness (generalized): Secondary | ICD-10-CM

## 2015-11-29 DIAGNOSIS — R2681 Unsteadiness on feet: Secondary | ICD-10-CM | POA: Diagnosis not present

## 2015-11-29 DIAGNOSIS — R2689 Other abnormalities of gait and mobility: Secondary | ICD-10-CM

## 2015-11-29 DIAGNOSIS — R279 Unspecified lack of coordination: Secondary | ICD-10-CM
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
## 2015-12-02 ENCOUNTER — Ambulatory Visit: Payer: Medicare Other | Admitting: Rehabilitation

## 2015-12-02 ENCOUNTER — Encounter: Payer: Self-pay | Admitting: Rehabilitation

## 2015-12-02 DIAGNOSIS — R279 Unspecified lack of coordination: Secondary | ICD-10-CM

## 2015-12-02 DIAGNOSIS — R2689 Other abnormalities of gait and mobility: Secondary | ICD-10-CM

## 2015-12-02 DIAGNOSIS — R2681 Unsteadiness on feet: Secondary | ICD-10-CM | POA: Diagnosis not present
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
## 2015-12-05 ENCOUNTER — Ambulatory Visit: Payer: Medicare Other | Admitting: Rehabilitation

## 2015-12-09 ENCOUNTER — Ambulatory Visit: Payer: Medicare Other | Attending: Internal Medicine | Admitting: Rehabilitation

## 2015-12-09 ENCOUNTER — Encounter: Payer: Self-pay | Admitting: Rehabilitation

## 2015-12-09 DIAGNOSIS — R279 Unspecified lack of coordination: Secondary | ICD-10-CM | POA: Insufficient documentation

## 2015-12-09 DIAGNOSIS — R2689 Other abnormalities of gait and mobility: Secondary | ICD-10-CM | POA: Diagnosis present

## 2015-12-09 DIAGNOSIS — M6281 Muscle weakness (generalized): Secondary | ICD-10-CM

## 2015-12-09 DIAGNOSIS — R2681 Unsteadiness on feet: Secondary | ICD-10-CM | POA: Insufficient documentation
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
## 2015-12-10 IMAGING — CR DG CHEST 2V
2 series · 2 of 2 positions shown · non-contrast
Comparison: 12/21/2014

CLINICAL DATA: Cough 3 weeks

EXAM:
CHEST  2 VIEW

[chest pa]
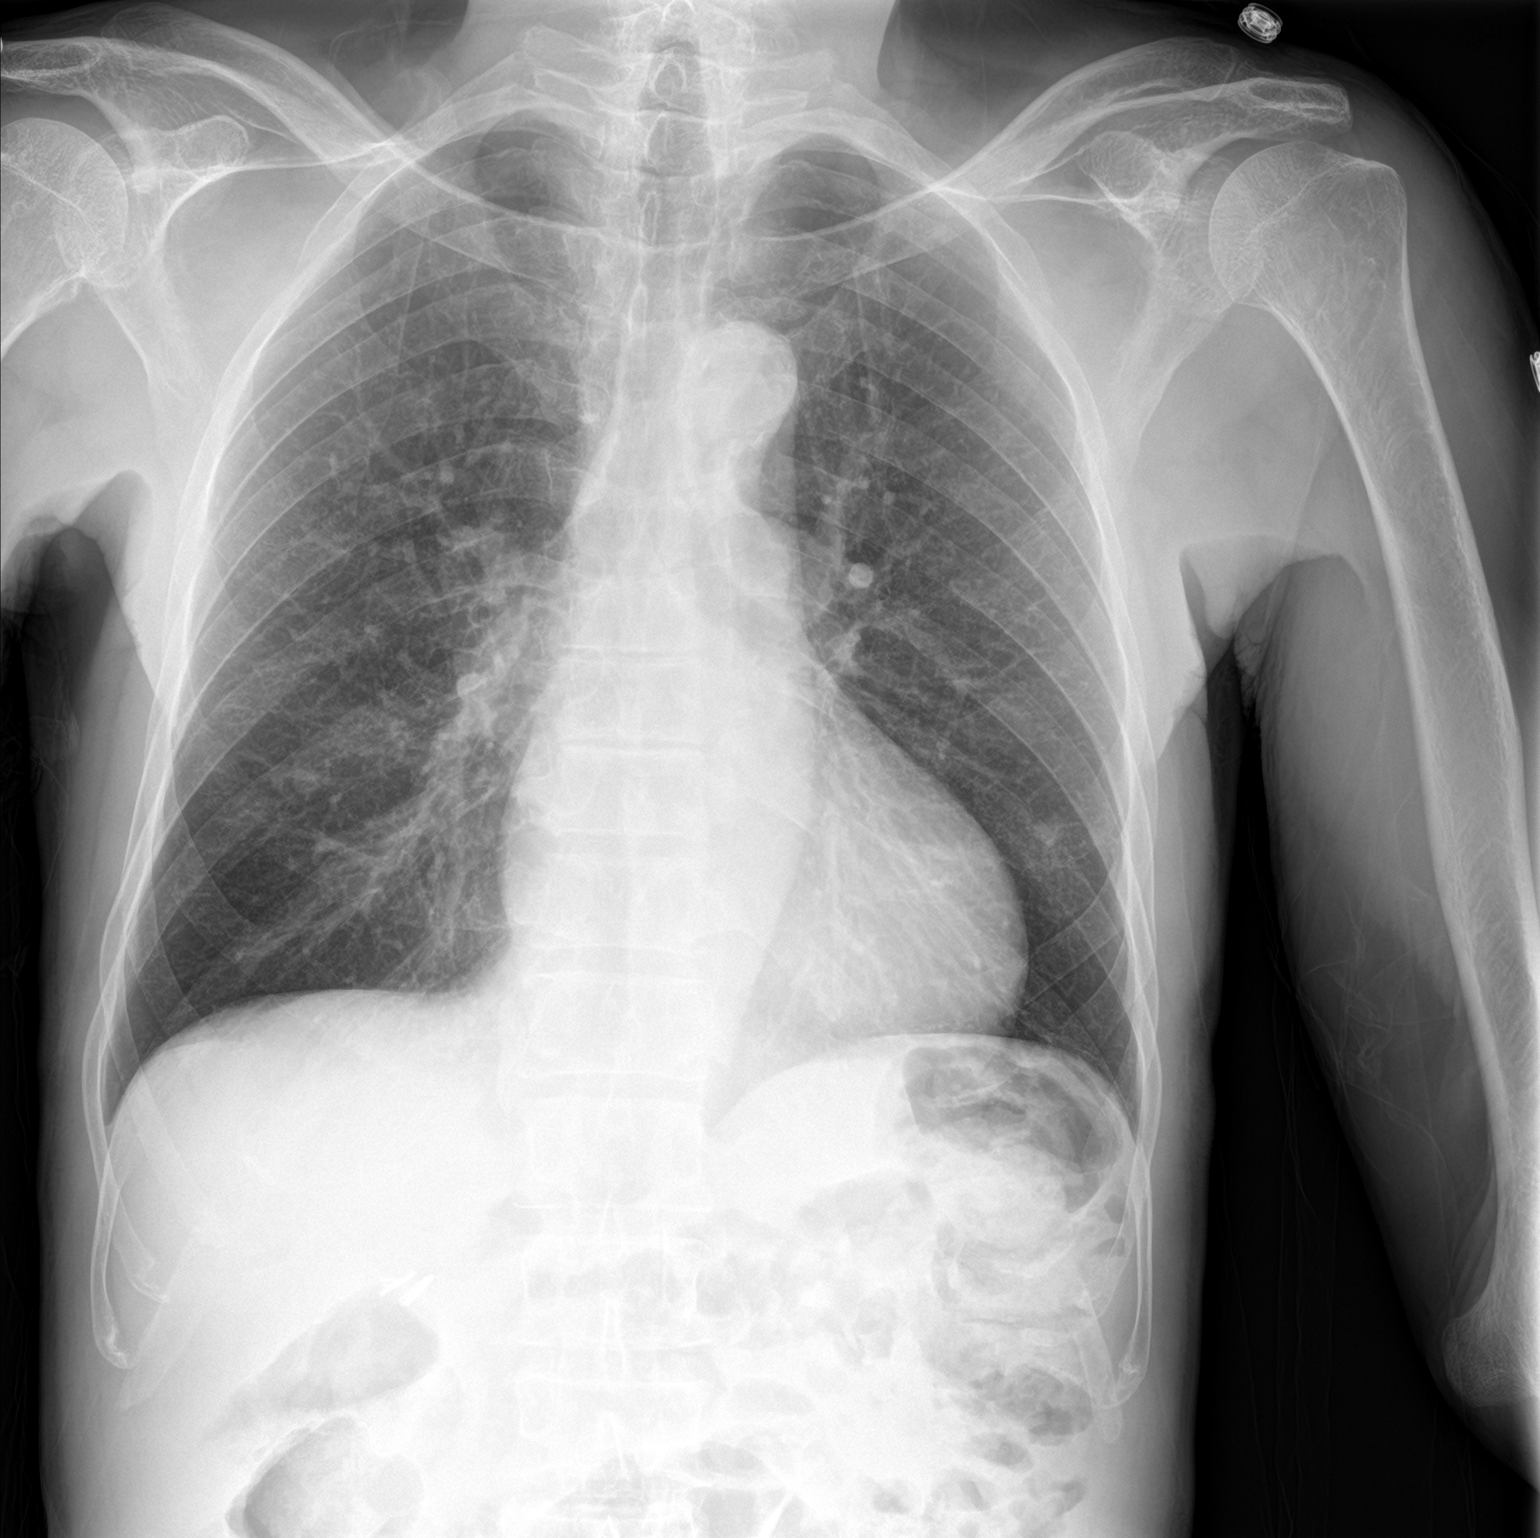

[chest lat]
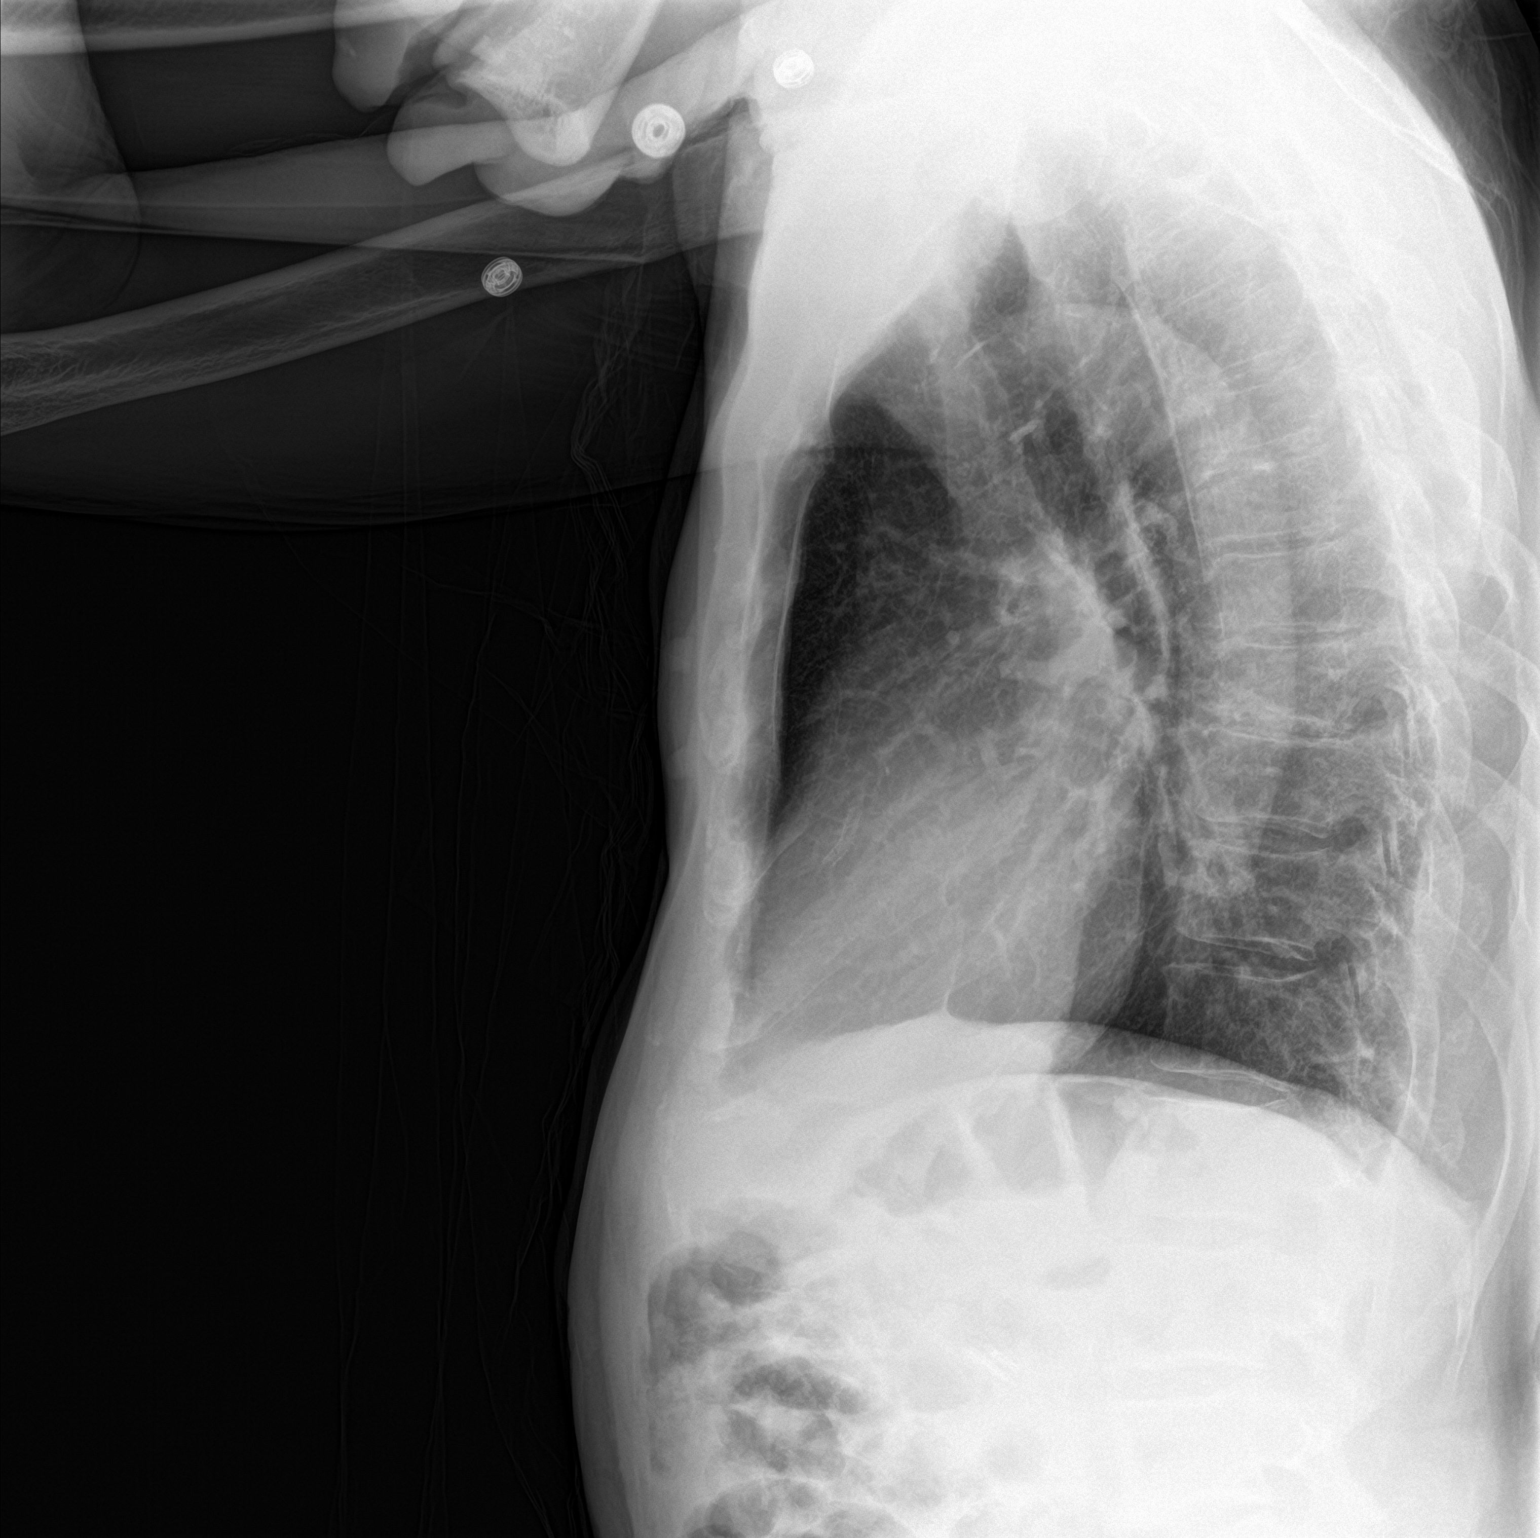

[2 of 2 positions shown; findings below may reference images not displayed]

FINDINGS: Mild cardiac enlargement. Negative for heart failure. Lungs are
clear without infiltrate or effusion. No acute skeletal abnormality.
IMPRESSION: No active cardiopulmonary disease.

## 2015-12-12 ENCOUNTER — Ambulatory Visit: Payer: Medicare Other | Admitting: Rehabilitation

## 2015-12-12 ENCOUNTER — Encounter: Payer: Self-pay | Admitting: Rehabilitation

## 2015-12-12 DIAGNOSIS — R2681 Unsteadiness on feet: Secondary | ICD-10-CM

## 2015-12-12 DIAGNOSIS — R279 Unspecified lack of coordination: Secondary | ICD-10-CM

## 2015-12-12 DIAGNOSIS — M6281 Muscle weakness (generalized): Secondary | ICD-10-CM

## 2015-12-12 DIAGNOSIS — R2689 Other abnormalities of gait and mobility: Secondary | ICD-10-CM
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
## 2015-12-12 NOTE — Patient Instructions (Signed)
   HAMSTRING STRETCH - TABLE, BED OR COUCH  Sit on a raised flat surface where you can prop your affected leg up on it such as a treatment table, couch or bed.   While keeping your knee straight to slightly bent, slowly lean forward and reach your hands towards your foot until a gentle stretch is felt along the back of your knee/thigh. Hold and then return to starting position and repeat.  Hold for 45 seconds, 3-4 times per day on each leg.   Hip Flexion / Knee Extension: Straight-Leg Raise (Eccentric)    Lie on back. Bent left knee. Keep right knee straight. Lift RIGHT leg with knee straight. Slowly lower leg for 3-5 seconds, making sure the back of your knee and the back of your heel touch the bed at the same time. _15__ reps per set, _2-3__ sets per day on each leg.  Copyright  VHI. All rights reserved.  Knee Extension: Sit to Stand (Eccentric)   Sit in a stable chairwith your walker directly in front of you.Scoot to edge of seat and lean forward to stand without using hands. Then, slowly lower self to seated position. _10__ reps per set, _2-3__ sets per day.  Copyright  VHI. All rights reserved.  For these balance exercises:  Stand in corner and have chair in front of you for support in case you need it.    Feet Together, Arm Motion - Eyes Closed    With eyes closed and feet together, Keep arms by your side.  Hold this position x 30 seconds.   Repeat __3__ times per session. Do __2__ sessions per day.  Copyright  VHI. All rights reserved.   Feet Together, Head Motion - Eyes Closed    With eyes CLOSED, feet together, move head slowly: up and down x 10 reps, side to side x 10 reps.  Repeat __1__ times per session. Do __2__ sessions per day.  Copyright  VHI. All rights reserved.   Feet Apart (Compliant Surface) Arm Motion - Eyes Closed    Stand on compliant surface: ___pillow or cushion_____, feet shoulder width apart. Close eyes and keep arms  by your side.  Hold for 30 seconds.   Repeat __3__ times per session. Do __2__ sessions per day.  Copyright  VHI. All rights reserved.      Hip Flexor Stretch    Lying on back near edge of bed, bend one leg, foot flat. Hang other leg over edge, relaxed, thigh resting entirely on bed for __2-3__ minutes. Repeat __2__ times on each leg. Do __2-3__ sessions per day. Advanced Exercise: Bend knee back keeping thigh in contact with bed.  [email or web address].exer.[email or web address]   Copyright  VHI. All rights reserved.

## 2015-12-16 ENCOUNTER — Encounter: Payer: Self-pay | Admitting: Rehabilitation

## 2015-12-16 ENCOUNTER — Ambulatory Visit: Payer: Medicare Other | Admitting: Rehabilitation

## 2015-12-16 DIAGNOSIS — R279 Unspecified lack of coordination: Secondary | ICD-10-CM

## 2015-12-16 DIAGNOSIS — R2681 Unsteadiness on feet: Secondary | ICD-10-CM | POA: Diagnosis not present

## 2015-12-16 DIAGNOSIS — R2689 Other abnormalities of gait and mobility: Secondary | ICD-10-CM

## 2015-12-16 DIAGNOSIS — M6281 Muscle weakness (generalized): Secondary | ICD-10-CM
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
## 2015-12-17 ENCOUNTER — Ambulatory Visit: Payer: Self-pay | Admitting: Internal Medicine

## 2015-12-19 ENCOUNTER — Ambulatory Visit: Payer: Medicare Other | Admitting: Rehabilitation

## 2015-12-19 ENCOUNTER — Encounter: Payer: Self-pay | Admitting: Rehabilitation

## 2015-12-19 DIAGNOSIS — R2681 Unsteadiness on feet: Secondary | ICD-10-CM | POA: Diagnosis not present

## 2015-12-19 DIAGNOSIS — R2689 Other abnormalities of gait and mobility: Secondary | ICD-10-CM

## 2015-12-19 DIAGNOSIS — M6281 Muscle weakness (generalized): Secondary | ICD-10-CM

## 2015-12-19 DIAGNOSIS — R279 Unspecified lack of coordination: Secondary | ICD-10-CM
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
## 2015-12-26 ENCOUNTER — Ambulatory Visit: Payer: Medicare Other | Admitting: Physical Therapy

## 2015-12-26 ENCOUNTER — Encounter: Payer: Self-pay | Admitting: Physical Therapy

## 2015-12-26 DIAGNOSIS — R2681 Unsteadiness on feet: Secondary | ICD-10-CM | POA: Diagnosis not present

## 2015-12-26 DIAGNOSIS — R2689 Other abnormalities of gait and mobility: Secondary | ICD-10-CM

## 2015-12-26 DIAGNOSIS — M6281 Muscle weakness (generalized): Secondary | ICD-10-CM

## 2015-12-26 DIAGNOSIS — R279 Unspecified lack of coordination: Secondary | ICD-10-CM
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
## 2015-12-30 ENCOUNTER — Encounter: Payer: Self-pay | Admitting: Physical Therapy

## 2015-12-30 ENCOUNTER — Ambulatory Visit: Payer: Medicare Other | Admitting: Physical Therapy

## 2015-12-30 DIAGNOSIS — R2681 Unsteadiness on feet: Secondary | ICD-10-CM

## 2015-12-30 DIAGNOSIS — M6281 Muscle weakness (generalized): Secondary | ICD-10-CM

## 2015-12-30 DIAGNOSIS — R2689 Other abnormalities of gait and mobility: Secondary | ICD-10-CM

## 2015-12-30 DIAGNOSIS — R279 Unspecified lack of coordination: Secondary | ICD-10-CM
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
## 2016-01-01 ENCOUNTER — Ambulatory Visit (INDEPENDENT_AMBULATORY_CARE_PROVIDER_SITE_OTHER): Payer: Medicare Other | Admitting: Internal Medicine

## 2016-01-01 VITALS — BP 160/62 | HR 68 | Resp 16 | Ht 68.0 in | Wt 139.0 lb

## 2016-01-01 DIAGNOSIS — Z8546 Personal history of malignant neoplasm of prostate: Secondary | ICD-10-CM

## 2016-01-01 DIAGNOSIS — Z85038 Personal history of other malignant neoplasm of large intestine: Secondary | ICD-10-CM

## 2016-01-01 DIAGNOSIS — R7303 Prediabetes: Secondary | ICD-10-CM

## 2016-01-01 DIAGNOSIS — I25118 Atherosclerotic heart disease of native coronary artery with other forms of angina pectoris: Secondary | ICD-10-CM

## 2016-01-01 DIAGNOSIS — Z23 Encounter for immunization: Secondary | ICD-10-CM

## 2016-01-01 DIAGNOSIS — J449 Chronic obstructive pulmonary disease, unspecified: Secondary | ICD-10-CM

## 2016-01-01 DIAGNOSIS — I10 Essential (primary) hypertension: Secondary | ICD-10-CM

## 2016-01-01 DIAGNOSIS — G119 Hereditary ataxia, unspecified: Secondary | ICD-10-CM

## 2016-01-01 MED ORDER — NICOTINE POLACRILEX 2 MG MT GUM
2.0000 mg | CHEWING_GUM | OROMUCOSAL | 0 refills | Status: DC | PRN
Start: 2016-01-01 — End: 2016-04-26

## 2016-01-01 NOTE — Patient Instructions (Signed)
Will have HOT check on your blood pressure at home and your meds again as well as bring by produce. Will check with Dr. Alyson Ingles about bloodwork--if I need more, I will come to you to draw blood

## 2016-01-01 NOTE — Progress Notes (Signed)
Subjective:    Patient ID: [name], male    DOB: 1931-02-10, 82 y.o.   MRN: [id number]  HPI   Here after long hiatus  1.  Tobacco Abuse:  His wife was diagnosed with GI ?colon cancer and undergoing chemotherapy after surgery.  He is stressed and needs the cigarettes to "cool down"  Would be willing to consider nicotine gum instead.  2.  Prostate Cancer:  States he sees a Dr. Nicolette Bang, Urology, and had blood work done last week, but do not see that in his chart.  Used to be followed by Dr. Janice Norrie.  3.  Prediabetes:  Not eating like he should with his wife going through treatment.  He would be helped by produce from the gardens being dropped by.  4.  CAD:  No chest pain.  Has not needed SL nitrates.  Not clear if he is taking meds as he should.  5.  Balance Issues with history of brain surgery and stroke:  Feels he is getting more balanced with PT.  Continues to work with Willow Ora at [facility] to improve fall risk.  Using a walker regularly.   6.  Essential Hypertension:  BPs from [place] were much closer to goal of <140/90 in recent weeks, but high again today.  He did not bring his medications with him to evaluate whether he is taking as recommended.  Current Meds  Medication Sig  . amLODipine (NORVASC) 10 MG tablet 1 tab by mouth daily in the evening  . aspirin 81 MG tablet Take 81 mg by mouth daily.  Marland Kitchen atorvastatin (LIPITOR) 40 MG tablet 1 tab by mouth daily with evening meal  . budesonide-formoterol (SYMBICORT) 80-4.5 MCG/ACT inhaler Inhale 1 puff into the lungs 2 (two) times daily.  . furosemide (LASIX) 20 MG tablet 1 tab by mouth daily in morning  . hydrALAZINE (APRESOLINE) 100 MG tablet Take 1 tablet (100 mg total) by mouth 2 (two) times daily.  . IRON PO Take 1 tablet by mouth at bedtime. Reported on 09/16/2015  . isosorbide mononitrate (IMDUR) 60 MG 24 hr tablet Take 1 tablet (60 mg total) by mouth daily.  Marland Kitchen losartan (COZAAR) 100 MG tablet 1 tab by mouth daily  in the morning  . metoprolol tartrate (LOPRESSOR) 25 MG tablet Take 1 tablet (25 mg total) by mouth 2 (two) times daily.  Marland Kitchen Spacer/Aero-Holding Chambers (AEROCHAMBER PLUS FLO-VU MEDIUM) MISC 1 each by Other route once.   No Known Allergies Review of Systems     Objective:   Physical Exam  Still with balance issues/ataxia without walker Smells strongly of cigarette smoke Natty dresser NAD HEENT:  PERRL, EOMI, TMs pearly gray. Neck:  Supple, no adenopathy Chest:  CTA, decreased BS throughout CV:  RRR without murmur or rub, no S3, or S4.  No JVD, No LE edema.  Radial and DP pulses normal and equal        Assessment & Plan:  1.  Tobacco Abuse:  He would like to quit, but lots of stress.  Discussed his wife, daughter and grandson should not be exposed to cigarette smoke.  Recommended to pick up Nicorette gum and chew when he feels need for smoking.  Should be cheaper than cigarettes as well.  Can wean from gum once his stress declines.  2.  Prostate Cancer:  Call Dr. Alyson Ingles and see what labs were done as cannot see in EPIC.  He needs several follow up labs.  Nonfasting today  3.  History of cecal cancer:  Long discussion today regarding whether he should ever undergo surveillance with colonoscopy as he has not had any follow up regarding this since 2009 as far as I can tell and never had a follow up colonoscopy after tumor removed.   Discussed spoke with both Dr. Tamala Julian, cardiology and Dr. Amedeo Plenty, GI, who also agreed with his fragile health would not recommend repeat colonoscopy at this point and likely would not tolerate major abdominal surgery even if tumor found on colonoscopy.   Patient agreed with this assessment and also does not want to pursue.    4.  Prediabetes:  [facility] or another member of [place] will see about dropping off fresh produce from gardens to his home weekly as it is available for improved diet. Discussed he or family can drop by and pick up produce from garden as  well.  5.  CAD/CHF:  Compensated and no symptoms of angina in some time.  Trying to keep bp under better control.  Not clear he is compliant with meds still.  HOT will continue intermittent home visits to go over meds and check bp  6.  Essential Hypertension:  Recently with better control--as above.  7.  Ataxia:  Continues to improve with PT.  8.  HM:  Flu vaccine

## 2016-01-02 ENCOUNTER — Encounter: Payer: Self-pay | Admitting: Internal Medicine

## 2016-01-03 ENCOUNTER — Ambulatory Visit: Payer: Medicare Other | Admitting: Rehabilitation

## 2016-01-06 ENCOUNTER — Encounter: Payer: Self-pay | Admitting: Rehabilitation

## 2016-01-06 ENCOUNTER — Ambulatory Visit: Payer: Medicare Other | Admitting: Rehabilitation

## 2016-01-06 DIAGNOSIS — M6281 Muscle weakness (generalized): Secondary | ICD-10-CM

## 2016-01-06 DIAGNOSIS — R279 Unspecified lack of coordination: Secondary | ICD-10-CM

## 2016-01-06 DIAGNOSIS — R2681 Unsteadiness on feet: Secondary | ICD-10-CM

## 2016-01-06 DIAGNOSIS — R2689 Other abnormalities of gait and mobility: Secondary | ICD-10-CM

## 2016-01-06 NOTE — Patient Instructions (Signed)
   HAMSTRING STRETCH - TABLE, BED OR COUCH  Sit on a raised flat surface where you can prop your affected leg up on it such as a treatment table, couch or bed.   While keeping your knee straight to slightly bent, slowly lean forward and reach your hands towards your foot until a gentle stretch is felt along the back of your knee/thigh. Hold and then return to starting position and repeat.  Hold for 45 seconds, 3-4 times per day on each leg.   Hip Flexion / Knee Extension: Straight-Leg Raise (Eccentric)    Lie on back. Bent left knee. Keep right knee straight. Lift RIGHT leg with knee straight. Slowly lower leg for 3-5 seconds, making sure the back of your knee and the back of your heel touch the bed at the same time. _15__ reps per set, _2-3__ sets per day on each leg.  Copyright  VHI. All rights reserved.  Knee Extension: Sit to Stand (Eccentric)   Sit in a stable chairwith your walker directly in front of you.Scoot to edge of seat and lean forward to stand without using hands. Then, slowly lower self to seated position. _10__ reps per set, _2-3__ sets per day.  Copyright  VHI. All rights reserved.  For these balance exercises:  Stand in corner and have chair in front of you for support in case you need it.    Feet Together, Arm Motion - Eyes Closed    With eyes closed and feet together, Keep arms by your side.  Hold this position x 30 seconds.   Repeat __3__ times per session. Do __2__ sessions per day.  Copyright  VHI. All rights reserved.   Feet Together, Head Motion - Eyes Open    With eyes open, feet together, move head slowly: up and down x 10 reps, side to side x 10 reps.  Repeat __1__ times per session. Do __2__ sessions per day.  Copyright  VHI. All rights reserved.   Feet Apart (Compliant Surface) Arm Motion - Eyes Closed    Stand on compliant surface: ___pillow or cushion_____, feet shoulder width apart. Close eyes and keep arms by  your side.  Hold for 30 seconds.   Repeat __3__ times per session. Do __2__ sessions per day.
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
## 2016-04-01 ENCOUNTER — Ambulatory Visit: Payer: Self-pay | Admitting: Internal Medicine

## 2016-04-26 ENCOUNTER — Emergency Department (HOSPITAL_COMMUNITY): Payer: Medicare Other

## 2016-04-26 ENCOUNTER — Encounter (HOSPITAL_COMMUNITY): Payer: Self-pay

## 2016-04-26 ENCOUNTER — Observation Stay (HOSPITAL_COMMUNITY)
Admission: EM | Admit: 2016-04-26 | Discharge: 2016-04-27 | Disposition: A | Discharge status: Home / Self Care | Payer: Medicare Other | Attending: Interventional Cardiology | Admitting: Interventional Cardiology

## 2016-04-26 DIAGNOSIS — Z8673 Personal history of transient ischemic attack (TIA), and cerebral infarction without residual deficits: Secondary | ICD-10-CM | POA: Insufficient documentation

## 2016-04-26 DIAGNOSIS — Z955 Presence of coronary angioplasty implant and graft: Secondary | ICD-10-CM | POA: Diagnosis not present

## 2016-04-26 DIAGNOSIS — N183 Chronic kidney disease, stage 3 (moderate): Secondary | ICD-10-CM | POA: Insufficient documentation

## 2016-04-26 DIAGNOSIS — I130 Hypertensive heart and chronic kidney disease with heart failure and stage 1 through stage 4 chronic kidney disease, or unspecified chronic kidney disease: Secondary | ICD-10-CM | POA: Insufficient documentation

## 2016-04-26 DIAGNOSIS — Z7982 Long term (current) use of aspirin: Secondary | ICD-10-CM | POA: Diagnosis not present

## 2016-04-26 DIAGNOSIS — Z85038 Personal history of other malignant neoplasm of large intestine: Secondary | ICD-10-CM | POA: Insufficient documentation

## 2016-04-26 DIAGNOSIS — Z79899 Other long term (current) drug therapy: Secondary | ICD-10-CM | POA: Diagnosis not present

## 2016-04-26 DIAGNOSIS — I200 Unstable angina: Secondary | ICD-10-CM

## 2016-04-26 DIAGNOSIS — E785 Hyperlipidemia, unspecified: Secondary | ICD-10-CM | POA: Insufficient documentation

## 2016-04-26 DIAGNOSIS — F17210 Nicotine dependence, cigarettes, uncomplicated: Secondary | ICD-10-CM | POA: Diagnosis not present

## 2016-04-26 DIAGNOSIS — Z7951 Long term (current) use of inhaled steroids: Secondary | ICD-10-CM | POA: Diagnosis not present

## 2016-04-26 DIAGNOSIS — R079 Chest pain, unspecified: Secondary | ICD-10-CM | POA: Diagnosis present

## 2016-04-26 DIAGNOSIS — I25110 Atherosclerotic heart disease of native coronary artery with unstable angina pectoris: Secondary | ICD-10-CM | POA: Diagnosis not present

## 2016-04-26 DIAGNOSIS — I5032 Chronic diastolic (congestive) heart failure: Secondary | ICD-10-CM | POA: Insufficient documentation

## 2016-04-26 DIAGNOSIS — I252 Old myocardial infarction: Secondary | ICD-10-CM | POA: Diagnosis not present

## 2016-04-26 DIAGNOSIS — Z8546 Personal history of malignant neoplasm of prostate: Secondary | ICD-10-CM | POA: Insufficient documentation

## 2016-04-26 DIAGNOSIS — J449 Chronic obstructive pulmonary disease, unspecified: Secondary | ICD-10-CM | POA: Insufficient documentation

## 2016-04-26 LAB — I-STAT CHEM 8, ED
BUN: 25 mg/dL — AB (ref 6–20)
CALCIUM ION: 1.2 mmol/L (ref 1.15–1.40)
CHLORIDE: 104 mmol/L (ref 101–111)
Creatinine, Ser: 1.9 mg/dL — ABNORMAL HIGH (ref 0.61–1.24)
Glucose, Bld: 138 mg/dL — ABNORMAL HIGH (ref 65–99)
HEMATOCRIT: 40 % (ref 39.0–52.0)
Hemoglobin: 13.6 g/dL (ref 13.0–17.0)
Potassium: 4.3 mmol/L (ref 3.5–5.1)
SODIUM: 142 mmol/L (ref 135–145)
TCO2: 30 mmol/L (ref 0–100)

## 2016-04-26 LAB — BASIC METABOLIC PANEL
Anion gap: 10 (ref 5–15)
BUN: 19 mg/dL (ref 6–20)
CHLORIDE: 103 mmol/L (ref 101–111)
CO2: 23 mmol/L (ref 22–32)
Calcium: 10.3 mg/dL (ref 8.9–10.3)
Creatinine, Ser: 1.88 mg/dL — ABNORMAL HIGH (ref 0.61–1.24)
GFR calc Af Amer: 36 mL/min — ABNORMAL LOW (ref >60)
GFR calc non Af Amer: 31 mL/min — ABNORMAL LOW (ref >60)
GLUCOSE: 135 mg/dL — AB (ref 65–99)
POTASSIUM: 4.3 mmol/L (ref 3.5–5.1)
Sodium: 136 mmol/L (ref 135–145)

## 2016-04-26 LAB — CBC WITH DIFFERENTIAL/PLATELET
BASOS PCT: 1 %
Basophils Absolute: 0.1 10*3/uL (ref 0.0–0.1)
EOS PCT: 33 %
Eosinophils Absolute: 2.2 10*3/uL — ABNORMAL HIGH (ref 0.0–0.7)
HCT: 37 % — ABNORMAL LOW (ref 39.0–52.0)
HEMOGLOBIN: 12.3 g/dL — AB (ref 13.0–17.0)
LYMPHS PCT: 16 %
Lymphs Abs: 1.1 10*3/uL (ref 0.7–4.0)
MCH: 28.9 pg (ref 26.0–34.0)
MCHC: 33.2 g/dL (ref 30.0–36.0)
MCV: 87.1 fL (ref 78.0–100.0)
Monocytes Absolute: 0.3 10*3/uL (ref 0.1–1.0)
Monocytes Relative: 5 %
NEUTROS PCT: 45 %
Neutro Abs: 3 10*3/uL (ref 1.7–7.7)
Platelets: 155 10*3/uL (ref 150–400)
RBC: 4.25 MIL/uL (ref 4.22–5.81)
RDW: 15.1 % (ref 11.5–15.5)
WBC: 6.7 10*3/uL (ref 4.0–10.5)

## 2016-04-26 LAB — I-STAT TROPONIN, ED: Troponin i, poc: 0 ng/mL (ref 0.00–0.08)

## 2016-04-26 LAB — TROPONIN I: TROPONIN I: 0.05 ng/mL — AB (ref <0.03)

## 2016-04-26 MED ORDER — ALBUTEROL SULFATE HFA 108 (90 BASE) MCG/ACT IN AERS: 2.0000 | INHALATION_SPRAY | Freq: Four times a day (QID) | RESPIRATORY_TRACT | Status: DC | PRN

## 2016-04-26 MED ORDER — HYDRALAZINE HCL 50 MG PO TABS
100.0000 mg | ORAL_TABLET | Freq: Two times a day (BID) | ORAL | Status: DC
  Administered 2016-04-26 – 2016-04-27 (×2): 100 mg via ORAL
  Filled 2016-04-26 (×2): qty 2

## 2016-04-26 MED ORDER — NITROGLYCERIN IN D5W 200-5 MCG/ML-% IV SOLN
0.0000 ug/min | INTRAVENOUS | Status: DC
  Administered 2016-04-26: 5 ug/min via INTRAVENOUS
  Filled 2016-04-26: qty 250

## 2016-04-26 MED ORDER — AMLODIPINE BESYLATE 5 MG PO TABS
5.0000 mg | ORAL_TABLET | Freq: Every day | ORAL | Status: DC
  Administered 2016-04-26: 5 mg via ORAL
  Filled 2016-04-26: qty 1

## 2016-04-26 MED ORDER — ISOSORBIDE MONONITRATE ER 60 MG PO TB24
60.0000 mg | ORAL_TABLET | Freq: Every day | ORAL | Status: DC
  Administered 2016-04-26: 60 mg via ORAL
  Filled 2016-04-26: qty 1

## 2016-04-26 MED ORDER — ACETAMINOPHEN 325 MG PO TABS: 650.0000 mg | ORAL_TABLET | ORAL | Status: DC | PRN

## 2016-04-26 MED ORDER — ATORVASTATIN CALCIUM 40 MG PO TABS
40.0000 mg | ORAL_TABLET | Freq: Every day | ORAL | Status: DC
  Administered 2016-04-26: 40 mg via ORAL
  Filled 2016-04-26: qty 1

## 2016-04-26 MED ORDER — AEROCHAMBER PLUS FLO-VU MEDIUM MISC
1.0000 | Freq: Once | Status: DC
Start: 2016-04-26 — End: 2016-04-26

## 2016-04-26 MED ORDER — LOSARTAN POTASSIUM 50 MG PO TABS
100.0000 mg | ORAL_TABLET | Freq: Every day | ORAL | Status: DC
  Administered 2016-04-27: 100 mg via ORAL
  Filled 2016-04-26: qty 2

## 2016-04-26 MED ORDER — FUROSEMIDE 40 MG PO TABS
40.0000 mg | ORAL_TABLET | Freq: Every day | ORAL | Status: DC
  Administered 2016-04-27: 40 mg via ORAL
  Filled 2016-04-26: qty 2

## 2016-04-26 MED ORDER — HEPARIN (PORCINE) IN NACL 100-0.45 UNIT/ML-% IJ SOLN
850.0000 [IU]/h | INTRAMUSCULAR | Status: DC
  Administered 2016-04-26: 1000 [IU]/h via INTRAVENOUS
  Filled 2016-04-26: qty 250

## 2016-04-26 MED ORDER — ONDANSETRON HCL 4 MG/2ML IJ SOLN: 4.0000 mg | Freq: Four times a day (QID) | INTRAMUSCULAR | Status: DC | PRN

## 2016-04-26 MED ORDER — ASPIRIN EC 81 MG PO TBEC
81.0000 mg | DELAYED_RELEASE_TABLET | Freq: Every day | ORAL | Status: DC
  Administered 2016-04-26 – 2016-04-27 (×2): 81 mg via ORAL
  Filled 2016-04-26 (×2): qty 1

## 2016-04-26 MED ORDER — HEPARIN BOLUS VIA INFUSION
3000.0000 [IU] | Freq: Once | INTRAVENOUS | Status: AC
  Administered 2016-04-26: 3000 [IU] via INTRAVENOUS
  Filled 2016-04-26: qty 3000

## 2016-04-26 MED ORDER — METOPROLOL TARTRATE 25 MG PO TABS
25.0000 mg | ORAL_TABLET | Freq: Two times a day (BID) | ORAL | Status: DC
  Administered 2016-04-26: 25 mg via ORAL
  Filled 2016-04-26: qty 1

## 2016-04-26 MED ORDER — ALBUTEROL SULFATE (2.5 MG/3ML) 0.083% IN NEBU: 2.5000 mg | INHALATION_SOLUTION | Freq: Four times a day (QID) | RESPIRATORY_TRACT | Status: DC | PRN

## 2016-04-26 MED ORDER — NITROGLYCERIN 0.4 MG SL SUBL: 0.4000 mg | SUBLINGUAL_TABLET | SUBLINGUAL | Status: DC | PRN
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
## 2016-04-26 NOTE — ED Triage Notes (Signed)
To hallway bed via EMS.  Onset 11:30a chest pain after taking unknown medication.  EMS found 3 prescription bottles:  Amlodipine, hydralazine, Isosorbide.  Pt took NTG x 1, EMS gave x 2 more and ASA 324 mg.  Pt has decreased since onset.  No other s/s noted.
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
## 2016-04-26 NOTE — Progress Notes (Signed)
CRITICAL VALUE ALERT  Critical value received: Troponin 0.05  Date of notification: 04/26/16  Time of notification:  07:20 pm  Critical value read back: yes  Nurse who received alert: Rich Number  MD notified (1st page):  Dr. Radford Pax  Time of first page:  07:26pm  MD notified (2nd page):  Time of second page: 08:32 pm  Responding MD:  Dr. Radford Pax  Time MD responded:  08:35 pm

## 2016-04-26 NOTE — ED Notes (Signed)
ED Provider at bedside. 

## 2016-04-26 NOTE — ED Notes (Signed)
Attempted report x1. 

## 2016-04-26 NOTE — ED Provider Notes (Signed)
Complained of anterior chest pain nonradiating onset approximately 1 hour prior to coming here lasted approximately 40 minutes, resolved after treatment with one sublingual nitroglycerin administered by himself and one sublingual nitroglycerin administered by EMS. EMS also treated patient with 4 baby aspirin's. Patient's not recall the last time he had chest pain. No other associated symptoms. Presently asymptomatic on exam he appears in no distress neck supple. No JVD lungs clear auscultation heart regular rate and rhythm abdomen nondistended nontender   Orlie Dakin, MD 04/26/16 1346

## 2016-04-27 DIAGNOSIS — I10 Essential (primary) hypertension: Secondary | ICD-10-CM

## 2016-04-27 DIAGNOSIS — I5032 Chronic diastolic (congestive) heart failure: Secondary | ICD-10-CM | POA: Diagnosis not present

## 2016-04-27 DIAGNOSIS — I130 Hypertensive heart and chronic kidney disease with heart failure and stage 1 through stage 4 chronic kidney disease, or unspecified chronic kidney disease: Secondary | ICD-10-CM | POA: Diagnosis not present

## 2016-04-27 DIAGNOSIS — R079 Chest pain, unspecified: Secondary | ICD-10-CM

## 2016-04-27 DIAGNOSIS — N183 Chronic kidney disease, stage 3 (moderate): Secondary | ICD-10-CM | POA: Diagnosis not present

## 2016-04-27 DIAGNOSIS — I25110 Atherosclerotic heart disease of native coronary artery with unstable angina pectoris: Secondary | ICD-10-CM | POA: Diagnosis not present

## 2016-04-27 LAB — CBC
HEMATOCRIT: 32.7 % — AB (ref 39.0–52.0)
HEMOGLOBIN: 10.7 g/dL — AB (ref 13.0–17.0)
MCH: 28.4 pg (ref 26.0–34.0)
MCHC: 32.7 g/dL (ref 30.0–36.0)
MCV: 86.7 fL (ref 78.0–100.0)
Platelets: 144 10*3/uL — ABNORMAL LOW (ref 150–400)
RBC: 3.77 MIL/uL — AB (ref 4.22–5.81)
RDW: 15 % (ref 11.5–15.5)
WBC: 7.4 10*3/uL (ref 4.0–10.5)

## 2016-04-27 LAB — LIPID PANEL
CHOL/HDL RATIO: 2.6 ratio
Cholesterol: 110 mg/dL (ref 0–200)
HDL: 42 mg/dL (ref >40)
LDL CALC: 61 mg/dL (ref 0–99)
Triglycerides: 35 mg/dL (ref <150)
VLDL: 7 mg/dL (ref 0–40)

## 2016-04-27 LAB — TROPONIN I
TROPONIN I: 0.05 ng/mL — AB (ref <0.03)
Troponin I: 0.04 ng/mL (ref <0.03)

## 2016-04-27 LAB — HEPARIN LEVEL (UNFRACTIONATED)
HEPARIN UNFRACTIONATED: 0.82 [IU]/mL — AB (ref 0.30–0.70)
Heparin Unfractionated: 0.58 IU/mL (ref 0.30–0.70)

## 2016-04-27 MED ORDER — AMLODIPINE BESYLATE 10 MG PO TABS
10.0000 mg | ORAL_TABLET | Freq: Every day | ORAL | Status: DC
  Administered 2016-04-27: 10 mg via ORAL
  Filled 2016-04-27: qty 1

## 2016-04-27 MED ORDER — FUROSEMIDE 40 MG PO TABS: ORAL_TABLET | ORAL | 11 refills | Status: DC

## 2016-04-27 NOTE — Discharge Summary (Signed)
Discharge Summary    Patient ID: [name],  MRN: [id number], DOB/AGE: 1930/03/18 83 y.o.  Admit date: 04/26/2016 Discharge date: 04/27/2016  Primary Care Provider: [facility] Primary Cardiologist: Dr. Tamala Julian, H.   Discharge Diagnoses    Active Problems:   Unstable angina (HCC)   Allergies No Known Allergies   History of Present Illness     This 83 year old black male has a history of coronary artery disease.  He had a remote MI s/p BMS RCA. 2015 admit with pulmonary edema, cath w/ 90% small apical LAD too small for PCI, small Diag w/ 80% proximal stenosis. CFX w/ diffuse 40% dz and RCA 30% proximal, patent stent. Medical therapy was recommended and he had actually done fairly well through the years.   He quit taking his antihypertensive and anti-anginal medicines a few weeks ago when he ran out of it.  He continues to smoke.  He was sitting at the house watching television on the morning of 04/26/16 when he developed onset of midsternal pain described as a pressure pain.  He took a nitroglycerin which did nothing and after the pain lasted a couple hours he was brought to the emergency room.    Hospital Course     Consultants: None  He was given 2 nitroglycerin on arrival to the ED which resulted in relief of his pain. EKG was changed from the previous EKG of 2015 with resolution of his anterolateral T wave inversions, but he had new T-wave inversions inferiorly. He had a very mild bump in his Tropnin and medical management is recommended. His blood pressure improved. He was seen by Dr. Harrington Challenger and deemed ready for discharge. He ambulated without a problem and is stable for discharge with follow up arranged.  ___________  Discharge Vitals Blood pressure (!) 143/42, pulse 62, temperature 98.5 F (36.9 C), temperature source Oral, resp. rate 18, height 5\' 7"  (1.702 m), weight 148 lb (67.1 kg), SpO2 99 %.  Filed Weights   04/26/16 1301  Weight: 148 lb (67.1 kg)     Labs & Radiologic Studies     CBC  Recent Labs  04/26/16 1317 04/26/16 1357 04/27/16 0527  WBC 6.7  --  7.4  NEUTROABS 3.0  --   --   HGB 12.3* 13.6 10.7*  HCT 37.0* 40.0 32.7*  MCV 87.1  --  86.7  PLT 155  --  921*   Basic Metabolic Panel  Recent Labs  04/26/16 1317 04/26/16 1357  NA 136 142  K 4.3 4.3  CL 103 104  CO2 23  --   GLUCOSE 135* 138*  BUN 19 25*  CREATININE 1.88* 1.90*  CALCIUM 10.3  --    Cardiac Enzymes  Recent Labs  04/26/16 1804 04/26/16 2319 04/27/16 0527  TROPONINI 0.05* 0.05* 0.04*   Fasting Lipid Panel  Recent Labs  04/27/16 0527  CHOL 110  HDL 42  LDLCALC 61  TRIG 35  CHOLHDL 2.6   Thyroid Function Tests No results for input(s): TSH, T4TOTAL, T3FREE, THYROIDAB in the last 72 hours.  Invalid input(s): FREET3  Dg Chest 2 View  Result Date: 04/26/2016 CLINICAL DATA:  Chest pain EXAM: CHEST  2 VIEW COMPARISON:  February 01, 2015 FINDINGS: There are apparent nipple shadows bilaterally. There is no edema or consolidation. Heart is upper normal in size with pulmonary vascularity within normal limits. There is atherosclerotic calcification in the aorta. No pneumothorax. There is mild degenerative change in the thoracic spine. IMPRESSION: No edema or  consolidation. Apparent nipple shadows bilaterally. There is aortic atherosclerosis. Electronically Signed   By: Lowella Grip III M.D.   On: 04/26/2016 14:34    Diagnostic Studies/Procedures   No imaging was done during this visit.  Disposition   Pt is being discharged home today in good condition.  Follow-up Plans & Appointments    Follow-up Information    Cecilie Kicks, NP. Go on 05/11/2016.   Specialties:  Cardiology, Radiology Why:  Appointment time is at 9:00 am. Please arrive 15 minutes early to appointment Contact information: [address] [place] [address] [phone]          Discharge Instructions    Diet - low sodium heart healthy     Complete by:  As directed    Discharge patient    Complete by:  As directed    Discharge disposition:  01-Home or Self Care   Discharge patient date:  04/27/2016   Increase activity slowly    Complete by:  As directed    May shower / Bathe    Complete by:  As directed       Discharge Medications     Medication List    TAKE these medications   AEROCHAMBER PLUS FLO-VU MEDIUM Misc 1 each by Other route once.   albuterol 108 (90 Base) MCG/ACT inhaler Commonly known as:  PROAIR HFA Inhale 2 puffs into the lungs every 6 (six) hours as needed for wheezing or shortness of breath.   amLODipine 10 MG tablet Commonly known as:  NORVASC 1 tab by mouth daily in the evening   aspirin 81 MG tablet Take 81 mg by mouth daily.   atorvastatin 40 MG tablet Commonly known as:  LIPITOR 1 tab by mouth daily with evening meal   budesonide-formoterol 160-4.5 MCG/ACT inhaler Commonly known as:  SYMBICORT Inhale 2 puffs into the lungs 2 (two) times daily.   furosemide 40 MG tablet Commonly known as:  LASIX 1 tab by mouth daily in morning What changed:  medication strength   hydrALAZINE 100 MG tablet Commonly known as:  APRESOLINE Take 1 tablet (100 mg total) by mouth 2 (two) times daily.   isosorbide mononitrate 60 MG 24 hr tablet Commonly known as:  IMDUR Take 1 tablet (60 mg total) by mouth daily.   losartan 100 MG tablet Commonly known as:  COZAAR 1 tab by mouth daily in the morning   metoprolol tartrate 25 MG tablet Commonly known as:  LOPRESSOR Take 1 tablet (25 mg total) by mouth 2 (two) times daily.   nitroGLYCERIN 0.4 MG SL tablet Commonly known as:  NITROSTAT PLACE 1 TABLET UNDER THE TONGUE EVERY 5 MINUTES AS NEEDED FOR CHEST PAIN        Outstanding Labs/Studies   Duration of Discharge Encounter   Greater than 30 minutes including physician time.  Kristopher Glee PA-C 04/27/2016, 3:49 PM

## 2016-04-27 NOTE — Progress Notes (Signed)
Subjective: No CP  Breathng is OK   Objective: Vitals:   04/26/16 2103 04/26/16 2239 04/27/16 0046 04/27/16 0500  BP: (!) 161/58 (!) 125/54 (!) 137/48 (!) 155/61  Pulse: 62 (!) 53  (!) 58  Resp:  16  16  Temp:    98.5 F (36.9 C)  TempSrc:    Oral  SpO2:      Weight:      Height:       Weight change:   Intake/Output Summary (Last 24 hours) at 04/27/16 0808 Last data filed at 04/27/16 0447  Gross per 24 hour  Intake            63.41 ml  Output              400 ml  Net          -336.59 ml    General: Alert, awake, oriented x3, in no acute distress Neck:  JVP is normal Heart: Regular rate and rhythm, without murmurs, rubs, gallops.  Lungs: Clear to auscultation.  No rales or wheezes. Exemities:  No edema.   Neuro: Grossly intact, nonfocal.  Tele:  SB/SR   Lab Results: Results for orders placed or performed during the hospital encounter of 04/26/16 (from the past 24 hour(s))  CBC with Differential     Status: Abnormal   Collection Time: 04/26/16  1:17 PM  Result Value Ref Range   WBC 6.7 4.0 - 10.5 K/uL   RBC 4.25 4.22 - 5.81 MIL/uL   Hemoglobin 12.3 (L) 13.0 - 17.0 g/dL   HCT 37.0 (L) 39.0 - 52.0 %   MCV 87.1 78.0 - 100.0 fL   MCH 28.9 26.0 - 34.0 pg   MCHC 33.2 30.0 - 36.0 g/dL   RDW 15.1 11.5 - 15.5 %   Platelets 155 150 - 400 K/uL   Neutrophils Relative % 45 %   Lymphocytes Relative 16 %   Monocytes Relative 5 %   Eosinophils Relative 33 %   Basophils Relative 1 %   Neutro Abs 3.0 1.7 - 7.7 K/uL   Lymphs Abs 1.1 0.7 - 4.0 K/uL   Monocytes Absolute 0.3 0.1 - 1.0 K/uL   Eosinophils Absolute 2.2 (H) 0.0 - 0.7 K/uL   Basophils Absolute 0.1 0.0 - 0.1 K/uL   Smear Review MORPHOLOGY UNREMARKABLE   Basic metabolic panel     Status: Abnormal   Collection Time: 04/26/16  1:17 PM  Result Value Ref Range   Sodium 136 135 - 145 mmol/L   Potassium 4.3 3.5 - 5.1 mmol/L   Chloride 103 101 - 111 mmol/L   CO2 23 22 - 32 mmol/L   Glucose, Bld 135 (H) 65 - 99 mg/dL   BUN 19 6 - 20 mg/dL   Creatinine, Ser 1.88 (H) 0.61 - 1.24 mg/dL   Calcium 10.3 8.9 - 10.3 mg/dL   GFR calc non Af Amer 31 (L) >60 mL/min   GFR calc Af Amer 36 (L) >60 mL/min   Anion gap 10 5 - 15  I-stat troponin, ED (not at [facility], [facility])     Status: None   Collection Time: 04/26/16  1:55 PM  Result Value Ref Range   Troponin i, poc 0.00 0.00 - 0.08 ng/mL   Comment 3          I-Stat Chem 8, ED  (not at [facility], [facility])     Status: Abnormal   Collection Time: 04/26/16  1:57 PM  Result Value Ref Range  Sodium 142 135 - 145 mmol/L   Potassium 4.3 3.5 - 5.1 mmol/L   Chloride 104 101 - 111 mmol/L   BUN 25 (H) 6 - 20 mg/dL   Creatinine, Ser 1.90 (H) 0.61 - 1.24 mg/dL   Glucose, Bld 138 (H) 65 - 99 mg/dL   Calcium, Ion 1.20 1.15 - 1.40 mmol/L   TCO2 30 0 - 100 mmol/L   Hemoglobin 13.6 13.0 - 17.0 g/dL   HCT 40.0 39.0 - 52.0 %  Troponin I-(serum)     Status: Abnormal   Collection Time: 04/26/16  6:04 PM  Result Value Ref Range   Troponin I 0.05 (HH) <0.03 ng/mL  Troponin I-(serum)     Status: Abnormal   Collection Time: 04/26/16 11:19 PM  Result Value Ref Range   Troponin I 0.05 (HH) <0.03 ng/mL  Troponin I-(serum)     Status: Abnormal   Collection Time: 04/27/16  5:27 AM  Result Value Ref Range   Troponin I 0.04 (HH) <0.03 ng/mL  Heparin level (unfractionated)     Status: None   Collection Time: 04/27/16  5:27 AM  Result Value Ref Range   Heparin Unfractionated 0.58 0.30 - 0.70 IU/mL  CBC     Status: Abnormal   Collection Time: 04/27/16  5:27 AM  Result Value Ref Range   WBC 7.4 4.0 - 10.5 K/uL   RBC 3.77 (L) 4.22 - 5.81 MIL/uL   Hemoglobin 10.7 (L) 13.0 - 17.0 g/dL   HCT 32.7 (L) 39.0 - 52.0 %   MCV 86.7 78.0 - 100.0 fL   MCH 28.4 26.0 - 34.0 pg   MCHC 32.7 30.0 - 36.0 g/dL   RDW 15.0 11.5 - 15.5 %   Platelets 144 (L) 150 - 400 K/uL  Lipid panel     Status: None   Collection Time: 04/27/16  5:27 AM  Result Value Ref Range   Cholesterol 110 0 - 200 mg/dL   Triglycerides 35  <150 mg/dL   HDL 42 >40 mg/dL   Total CHOL/HDL Ratio 2.6 RATIO   VLDL 7 0 - 40 mg/dL   LDL Cholesterol 61 0 - 99 mg/dL    Studies/Results: Dg Chest 2 View  Result Date: 04/26/2016 CLINICAL DATA:  Chest pain EXAM: CHEST  2 VIEW COMPARISON:  February 01, 2015 FINDINGS: There are apparent nipple shadows bilaterally. There is no edema or consolidation. Heart is upper normal in size with pulmonary vascularity within normal limits. There is atherosclerotic calcification in the aorta. No pneumothorax. There is mild degenerative change in the thoracic spine. IMPRESSION: No edema or consolidation. Apparent nipple shadows bilaterally. There is aortic atherosclerosis. Electronically Signed   By: Lowella Grip III M.D.   On: 04/26/2016 14:34    Medications:  Reviewed    @PROBHOSP @  1  CP  Resolved  Triv bump in Trop  I would continue medical Rx (resume)  Pt currently comfortable  If he can ambulate without problem OK to d/c later  F/ollow as outpt   2 HTN  BP is better    3  CKD StageIII  4  HL  LDL 61      LOS: 0 days   Dorris Carnes 04/27/2016, 8:08 AM

## 2016-04-27 NOTE — Progress Notes (Signed)
ANTICOAGULATION CONSULT NOTE - Follow Up Consult  Pharmacy Consult for Heparin  Indication: chest pain/ACS  No Known Allergies  Patient Measurements: Height: 5\' 7"  (170.2 cm) Weight: 148 lb (67.1 kg) IBW/kg (Calculated) : 66.1  Vital Signs: Temp: 98.5 F (36.9 C) (02/19 0500) Temp Source: Oral (02/19 0500) BP: 155/61 (02/19 0500) Pulse Rate: 58 (02/19 0500)  Labs:  Recent Labs  04/26/16 1317 04/26/16 1357 04/26/16 1804 04/26/16 2319 04/27/16 0527  HGB 12.3* 13.6  --   --  10.7*  HCT 37.0* 40.0  --   --  32.7*  PLT 155  --   --   --  144*  HEPARINUNFRC  --   --   --   --  0.58  CREATININE 1.88* 1.90*  --   --   --   TROPONINI  --   --  0.05* 0.05* 0.04*    Estimated Creatinine Clearance: 27.5 mL/min (by C-G formula based on SCr of 1.9 mg/dL (H)).    Assessment: Heparin for CP, initial heparin level is therapeutic Goal of Therapy:  Heparin level 0.3-0.7 units/ml Monitor platelets by anticoagulation protocol: Yes   Plan:  -Cont heparin 1000 units/hr -1200 HL  [name] 04/27/2016,7:08 AM

## 2016-04-27 NOTE — Progress Notes (Signed)
Discussed with the patient and all questioned fully answered. He will call me if any problems arise.  Pt and pt's daughter educated. Given 2 copies of AVS. Emphasized importance of medication adherence and follow up appointments.   IV removed. Telemetry removed.  Fritz Pickerel, RN

## 2016-04-27 NOTE — Discharge Instructions (Signed)
Weigh daily Call [phone] if weight climbs more than 3 pounds in a day or 5 pounds in a week. No salt to very little salt in your diet.  No more than 2000 mg in a day. Call if increased shortness of breath or increased swelling.

## 2016-04-27 NOTE — Progress Notes (Signed)
ANTICOAGULATION CONSULT NOTE - Follow Up Consult  Pharmacy Consult for Heparin  Indication: chest pain/ACS  No Known Allergies  Patient Measurements: Height: 5\' 7"  (170.2 cm) Weight: 148 lb (67.1 kg) IBW/kg (Calculated) : 66.1  Vital Signs: Temp: 98.5 F (36.9 C) (02/19 0500) Temp Source: Oral (02/19 0500) BP: 130/50 (02/19 1222) Pulse Rate: 62 (02/19 1222)  Labs:  Recent Labs  04/26/16 1317 04/26/16 1357 04/26/16 1804 04/26/16 2319 04/27/16 0527 04/27/16 1119  HGB 12.3* 13.6  --   --  10.7*  --   HCT 37.0* 40.0  --   --  32.7*  --   PLT 155  --   --   --  144*  --   HEPARINUNFRC  --   --   --   --  0.58 0.82*  CREATININE 1.88* 1.90*  --   --   --   --   TROPONINI  --   --  0.05* 0.05* 0.04*  --    Estimated Creatinine Clearance: 27.5 mL/min (by C-G formula based on SCr of 1.9 mg/dL (H)).  Assessment: 83 year old male to begin heparin for chest pain; heparin level supratheapeutic after being previously withing goal range. No bleeding or infusion related issues reported.   Goal of Therapy:  Heparin level 0.3-0.7 units/ml Monitor platelets by anticoagulation protocol: Yes   Plan:  Decrease heparin gtt to 850 units/hr Heparin level and CBC daily  Georga Bora, PharmD Clinical Pharmacist Pager: [phone] 04/27/2016 1:32 PM

## 2016-04-30 ENCOUNTER — Encounter: Payer: Self-pay | Admitting: Cardiology

## 2016-05-05 ENCOUNTER — Encounter: Payer: Self-pay | Admitting: Internal Medicine

## 2016-05-05 ENCOUNTER — Ambulatory Visit (INDEPENDENT_AMBULATORY_CARE_PROVIDER_SITE_OTHER): Payer: Self-pay | Admitting: Internal Medicine

## 2016-05-05 VITALS — BP 120/60 | HR 64 | Resp 12 | Ht 68.0 in | Wt 135.0 lb

## 2016-05-05 DIAGNOSIS — I25118 Atherosclerotic heart disease of native coronary artery with other forms of angina pectoris: Secondary | ICD-10-CM

## 2016-05-05 DIAGNOSIS — I10 Essential (primary) hypertension: Secondary | ICD-10-CM

## 2016-05-05 DIAGNOSIS — I5032 Chronic diastolic (congestive) heart failure: Secondary | ICD-10-CM

## 2016-05-05 DIAGNOSIS — N189 Chronic kidney disease, unspecified: Secondary | ICD-10-CM

## 2016-05-05 DIAGNOSIS — R7303 Prediabetes: Secondary | ICD-10-CM

## 2016-05-05 DIAGNOSIS — G119 Hereditary ataxia, unspecified: Secondary | ICD-10-CM

## 2016-05-05 NOTE — Progress Notes (Signed)
Subjective:    Patient ID: [name], male    DOB: 1931-01-16, 83 y.o.   MRN: [id number]  HPI  Pill box a mess.  3 bottles of hydralazine and 2 of Furosemide--the latter with 2 strengths, 20 mg and 40 mg.  1.  Tobacco use:  Smoking 3 cigarettes daily.  Trying to cut down.  Have tried to get him to try nicotine patches or gum without success.  2.  COPD:  Feels breathing is fine.  Not clear he is using symbicort regularly or as recommended.  3.  CAD:  Hospitalized 2.18.2018 after stopping medications for 2 weeks and developing chest pain/unstable angina.  Not clear why he stopped.  Perhaps as he feels he takes too many meds.  No chest pain since discharged on 04/27/2016.  No swelling of ankles.  Furosemide was increased to 40 mg in hospital.  Does have some renal insufficiency as well. No nocturia.  4.  Prediabetes.  Sugars running in low 100s in hospital labs.  Last A1C in May 2017 at 5.8%.  Drinks pepsi 3-4 times daily.  Discussed his risk for DM and then secondary complications of CAD and increased risk for stroke.  5.  Balance Issues:  Went through vestibular PT with some improvement, but still a problem.  Using his walker except when in house.   6.  Essential Hypertension: Appears he is taking his medication somewhat due to good reading today.  7.  Prostate Cancer:  [facility], Dr. Nicolette Bang next month.    Spent 20 minutes setting up pillbox today.  Current Meds  Medication Sig  . albuterol (PROAIR HFA) 108 (90 Base) MCG/ACT inhaler Inhale 2 puffs into the lungs every 6 (six) hours as needed for wheezing or shortness of breath.  Marland Kitchen amLODipine (NORVASC) 10 MG tablet 1 tab by mouth daily in the evening  . aspirin 81 MG tablet Take 81 mg by mouth daily.  Marland Kitchen atorvastatin (LIPITOR) 40 MG tablet 1 tab by mouth daily with evening meal  . furosemide (LASIX) 40 MG tablet 1 tab by mouth daily in morning  . hydrALAZINE (APRESOLINE) 100 MG tablet Take 1 tablet (100 mg total) by  mouth 2 (two) times daily.  . isosorbide mononitrate (IMDUR) 60 MG 24 hr tablet Take 1 tablet (60 mg total) by mouth daily.  Marland Kitchen losartan (COZAAR) 100 MG tablet 1 tab by mouth daily in the morning  . metoprolol tartrate (LOPRESSOR) 25 MG tablet Take 1 tablet (25 mg total) by mouth 2 (two) times daily.  . nitroGLYCERIN (NITROSTAT) 0.4 MG SL tablet PLACE 1 TABLET UNDER THE TONGUE EVERY 5 MINUTES AS NEEDED FOR CHEST PAIN  . Spacer/Aero-Holding Chambers (AEROCHAMBER PLUS FLO-VU MEDIUM) MISC 1 each by Other route once.    No Known Allergies      Review of Systems     Objective:   Physical Exam  Lungs:  CTA CV: RRR LE:  No edema Neuro:  Very ataxic gait still.  Using walker      Assessment & Plan:  1.  Essential Hypertension:  Not clear how he is taking meds as pillbox a mess.  Set back up again today.  Will check in at his home and have [place] check in intermittently as well.  2.  Ataxia:  Probably as stead as will get.  To continue walker use.  3.  COPD:  Fairly stable.  Encourage stopping cigarettes.  Have tried nicotine replacement before, but he never initiated.  4.  CAD:  Okay since hospitalization.  Needs to be consistent with meds.  As in #1.  CHF compensated today.  5.  Prediabetes:  A1C today.  Doing okay.  Continues significant soda use.  Follow up in 2 months.  6.  Renal insufficiency:  BMP

## 2016-05-06 LAB — BASIC METABOLIC PANEL
BUN/Creatinine Ratio: 18 (ref 10–24)
BUN: 35 mg/dL — ABNORMAL HIGH (ref 8–27)
CALCIUM: 10.5 mg/dL — AB (ref 8.6–10.2)
CO2: 25 mmol/L (ref 18–29)
CREATININE: 1.98 mg/dL — AB (ref 0.76–1.27)
Chloride: 99 mmol/L (ref 96–106)
GFR calc Af Amer: 35 mL/min/{1.73_m2} — ABNORMAL LOW (ref >59)
GFR, EST NON AFRICAN AMERICAN: 30 mL/min/{1.73_m2} — AB (ref >59)
GLUCOSE: 92 mg/dL (ref 65–99)
POTASSIUM: 4.5 mmol/L (ref 3.5–5.2)
SODIUM: 140 mmol/L (ref 134–144)

## 2016-05-06 LAB — HGB A1C W/O EAG: Hgb A1c MFr Bld: 5.7 % — ABNORMAL HIGH (ref 4.8–5.6)
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
## 2016-05-11 ENCOUNTER — Encounter: Payer: Self-pay | Admitting: Cardiology

## 2016-05-11 ENCOUNTER — Ambulatory Visit (INDEPENDENT_AMBULATORY_CARE_PROVIDER_SITE_OTHER): Payer: Medicare Other | Admitting: Cardiology

## 2016-05-11 VITALS — BP 142/66 | HR 54 | Ht 68.0 in | Wt 137.0 lb

## 2016-05-11 DIAGNOSIS — I10 Essential (primary) hypertension: Secondary | ICD-10-CM

## 2016-05-11 DIAGNOSIS — N183 Chronic kidney disease, stage 3 unspecified: Secondary | ICD-10-CM

## 2016-05-11 DIAGNOSIS — I2510 Atherosclerotic heart disease of native coronary artery without angina pectoris: Secondary | ICD-10-CM

## 2016-05-11 DIAGNOSIS — R079 Chest pain, unspecified: Secondary | ICD-10-CM | POA: Diagnosis not present

## 2016-05-11 DIAGNOSIS — E782 Mixed hyperlipidemia: Secondary | ICD-10-CM

## 2016-05-11 LAB — BASIC METABOLIC PANEL
BUN / CREAT RATIO: 17 (ref 10–24)
BUN: 33 mg/dL — ABNORMAL HIGH (ref 8–27)
CO2: 22 mmol/L (ref 18–29)
Calcium: 10.7 mg/dL — ABNORMAL HIGH (ref 8.6–10.2)
Chloride: 99 mmol/L (ref 96–106)
Creatinine, Ser: 1.9 mg/dL — ABNORMAL HIGH (ref 0.76–1.27)
GFR calc non Af Amer: 32 mL/min/{1.73_m2} — ABNORMAL LOW (ref >59)
GFR, EST AFRICAN AMERICAN: 37 mL/min/{1.73_m2} — AB (ref >59)
Glucose: 92 mg/dL (ref 65–99)
Potassium: 4.3 mmol/L (ref 3.5–5.2)
SODIUM: 138 mmol/L (ref 134–144)

## 2016-05-11 NOTE — Patient Instructions (Signed)
Medication Instructions:  Your physician recommends that you continue on your current medications as directed. Please refer to the Current Medication list given to you today.   Labwork: TODAY: BMET  Testing/Procedures: None  Follow-Up: Your physician wants you to follow-up in: 6 months with Dr. Tamala Julian. You will receive a reminder letter in the mail two months in advance. If you don't receive a letter, please call our office to schedule the follow-up appointment.   Any Other Special Instructions Will Be Listed Below (If Applicable).     If you need a refill on your cardiac medications before your next appointment, please call your pharmacy.

## 2016-06-09 ENCOUNTER — Ambulatory Visit: Payer: Self-pay | Admitting: Internal Medicine

## 2016-06-09 DIAGNOSIS — Z5329 Procedure and treatment not carried out because of patient's decision for other reasons: Secondary | ICD-10-CM

## 2016-06-09 DIAGNOSIS — Z91199 Patient's noncompliance with other medical treatment and regimen due to unspecified reason: Secondary | ICD-10-CM

## 2016-07-03 ENCOUNTER — Ambulatory Visit (INDEPENDENT_AMBULATORY_CARE_PROVIDER_SITE_OTHER): Payer: Self-pay | Admitting: Internal Medicine

## 2016-07-03 ENCOUNTER — Encounter: Payer: Self-pay | Admitting: Internal Medicine

## 2016-07-03 ENCOUNTER — Other Ambulatory Visit: Payer: Self-pay | Admitting: Internal Medicine

## 2016-07-03 VITALS — BP 172/88 | HR 62 | Resp 12 | Ht 65.5 in | Wt 137.0 lb

## 2016-07-03 DIAGNOSIS — I2510 Atherosclerotic heart disease of native coronary artery without angina pectoris: Secondary | ICD-10-CM

## 2016-07-03 DIAGNOSIS — N183 Chronic kidney disease, stage 3 unspecified: Secondary | ICD-10-CM

## 2016-07-03 DIAGNOSIS — I10 Essential (primary) hypertension: Secondary | ICD-10-CM

## 2016-07-03 DIAGNOSIS — G119 Hereditary ataxia, unspecified: Secondary | ICD-10-CM

## 2016-07-03 DIAGNOSIS — J449 Chronic obstructive pulmonary disease, unspecified: Secondary | ICD-10-CM

## 2016-07-03 NOTE — Progress Notes (Signed)
   Subjective:    Patient ID: [name], male    DOB: 09-27-1930, 83 y.o.   MRN: [id number]  HPI   Has not taken morning pills today.  States often does not take any meds until noon.   Eats breakfast around 9 a.m.  Has someone coming to the home to make breakfast.  Not clear why he waits to take meds at times.  Suspect he is not consistent with eating breakfast.  1.  CAD:  No chest pain.  No leg swelling.  2.  Essential Hypertension:  Back to square 1 with missing meds.  Went to patient's home again end of February and arranged pillbox.  He states his daughter is filling the box, but is not certain what day of week.  He keeps the box on his TV in his bedroom.  He feels this is the best place for him to remind himself to take medication, and not the kitchen.  3.  Cerebellar ataxia:  Feels he is doing well with walker.  4.  Prediabetes:  Drinking 3 Pepsis daily.  States trying to eat in a healthy way.  5.  Cholesterol:  At goal in 04/2016  6.  COPD:  Denies dyspnea or need for rescue inhaler.  States he is using his Symbicort regularly.  Current Meds  Medication Sig  . albuterol (PROAIR HFA) 108 (90 Base) MCG/ACT inhaler Inhale 2 puffs into the lungs every 6 (six) hours as needed for wheezing or shortness of breath.  Marland Kitchen amLODipine (NORVASC) 10 MG tablet 1 tab by mouth daily in the evening  . aspirin 81 MG tablet Take 81 mg by mouth daily.  Marland Kitchen atorvastatin (LIPITOR) 40 MG tablet 1 tab by mouth daily with evening meal  . budesonide-formoterol (SYMBICORT) 160-4.5 MCG/ACT inhaler Inhale 2 puffs into the lungs 2 (two) times daily.  . furosemide (LASIX) 40 MG tablet 1 tab by mouth daily in morning  . losartan (COZAAR) 100 MG tablet 1 tab by mouth daily in the morning  . metoprolol tartrate (LOPRESSOR) 25 MG tablet Take 1 tablet (25 mg total) by mouth 2 (two) times daily.  . nitroGLYCERIN (NITROSTAT) 0.4 MG SL tablet PLACE 1 TABLET UNDER THE TONGUE EVERY 5 MINUTES AS NEEDED FOR CHEST PAIN  .  Spacer/Aero-Holding Chambers (AEROCHAMBER PLUS FLO-VU MEDIUM) MISC 1 each by Other route once.    No Known Allergies    Review of Systems     Objective:   Physical Exam   NAD Lungs:  CTA CV:  RRR with normal S1 and S2, No S3, S4 , or murmur, radial and DP pulses normal and equal. Abd:  S, NT, No HSM or mass, + BS LE:  No edema Neuro: ataxic gait, left arm weakness unchanged.        Assessment & Plan:  1.  Essential Hypertension:  Checked his pill box as had time to check in with him after his visit at home.  Again, pill box not set up.  Filled entire box and asked him to take his Friday morning meds, but he did not seem motivated to do so.  The caregiver who was there did not seem interested in learning how to fill the box as well.    2. CAD:  Stable.  3.  Prediabetes:  Encouraged to cut back on soda.  4.  COPD:  Stable.  4. Cerebellar ataxia:  Stable.

## 2016-07-30 ENCOUNTER — Other Ambulatory Visit: Payer: Self-pay | Admitting: Internal Medicine

## 2016-07-30 DIAGNOSIS — I10 Essential (primary) hypertension: Secondary | ICD-10-CM

## 2016-08-03 NOTE — Progress Notes (Signed)
Went to check on patient at home as did not show for appt.  He was not home.

## 2016-08-08 ENCOUNTER — Encounter: Payer: Self-pay | Admitting: Internal Medicine

## 2016-08-08 ENCOUNTER — Other Ambulatory Visit: Payer: Self-pay | Admitting: Internal Medicine

## 2016-09-07 ENCOUNTER — Other Ambulatory Visit: Payer: Self-pay | Admitting: Internal Medicine

## 2016-09-07 DIAGNOSIS — I10 Essential (primary) hypertension: Secondary | ICD-10-CM

## 2016-10-02 ENCOUNTER — Ambulatory Visit: Payer: Medicare Other | Admitting: Internal Medicine

## 2016-10-12 ENCOUNTER — Other Ambulatory Visit: Payer: Self-pay | Admitting: Internal Medicine

## 2016-10-13 ENCOUNTER — Other Ambulatory Visit: Payer: Self-pay

## 2016-10-13 DIAGNOSIS — I10 Essential (primary) hypertension: Secondary | ICD-10-CM

## 2016-10-13 MED ORDER — AMLODIPINE BESYLATE 10 MG PO TABS: 10.0000 mg | ORAL_TABLET | Freq: Every evening | ORAL | 11 refills | Status: DC

## 2016-10-13 MED ORDER — METOPROLOL TARTRATE 25 MG PO TABS: ORAL_TABLET | ORAL | 11 refills | Status: DC

## 2016-10-13 MED ORDER — LOSARTAN POTASSIUM 100 MG PO TABS: 100.0000 mg | ORAL_TABLET | Freq: Every morning | ORAL | 11 refills | Status: DC

## 2016-10-13 MED ORDER — HYDRALAZINE HCL 100 MG PO TABS: ORAL_TABLET | ORAL | 11 refills | Status: DC

## 2016-10-13 MED ORDER — ATORVASTATIN CALCIUM 40 MG PO TABS: ORAL_TABLET | ORAL | 11 refills | Status: DC

## 2016-10-13 NOTE — Telephone Encounter (Signed)
Please check on this--it looks like he got refills on the 2nd with multiple meds for just 1 month, but no refills.--please make sure he has refills for 11 months

## 2016-10-13 NOTE — Telephone Encounter (Signed)
Corrected all Prescriptions. Rx's sent to [facility]

## 2016-10-14 ENCOUNTER — Ambulatory Visit: Payer: Self-pay | Admitting: Internal Medicine

## 2016-10-14 ENCOUNTER — Other Ambulatory Visit: Payer: Self-pay | Admitting: Internal Medicine

## 2016-10-14 DIAGNOSIS — Z5329 Procedure and treatment not carried out because of patient's decision for other reasons: Secondary | ICD-10-CM

## 2016-10-14 DIAGNOSIS — Z91199 Patient's noncompliance with other medical treatment and regimen due to unspecified reason: Secondary | ICD-10-CM

## 2016-10-23 ENCOUNTER — Ambulatory Visit (INDEPENDENT_AMBULATORY_CARE_PROVIDER_SITE_OTHER): Payer: Self-pay | Admitting: Internal Medicine

## 2016-10-23 ENCOUNTER — Encounter: Payer: Self-pay | Admitting: Internal Medicine

## 2016-10-23 VITALS — BP 170/80 | HR 60 | Resp 13 | Ht 65.5 in | Wt 142.0 lb

## 2016-10-23 DIAGNOSIS — H6121 Impacted cerumen, right ear: Secondary | ICD-10-CM

## 2016-10-23 NOTE — Progress Notes (Signed)
   Subjective:    Patient ID: [name], male    DOB: 06-16-1930, 83 y.o.   MRN: [id number]  HPI   Report from [place] that he could not hear 2 days ago and made appt. Hearing better now.  Here as right  ear has been plugged since last week after I saw him.  Used a hairpin in his ear and got wax out.  States he is hearing better since.  States he actually got a lot of wax out.   No water in his ear.  Ear does not hurt.     Review of Systems     Objective:   Physical Exam Right ear canal with cerumen impaction. Large plug of dark wax removed with combination curettage and irrigation      Assessment & Plan:  Right cerumen impaction: removed with curettage and irrigation.

## 2016-11-16 ENCOUNTER — Other Ambulatory Visit: Payer: Self-pay | Admitting: Internal Medicine

## 2016-11-16 DIAGNOSIS — I10 Essential (primary) hypertension: Secondary | ICD-10-CM

## 2016-11-16 DIAGNOSIS — I25810 Atherosclerosis of coronary artery bypass graft(s) without angina pectoris: Secondary | ICD-10-CM

## 2016-11-27 ENCOUNTER — Encounter: Payer: Self-pay | Admitting: Internal Medicine

## 2016-11-27 ENCOUNTER — Ambulatory Visit (INDEPENDENT_AMBULATORY_CARE_PROVIDER_SITE_OTHER): Payer: Self-pay | Admitting: Internal Medicine

## 2016-11-27 VITALS — BP 170/90 | HR 76 | Resp 12 | Ht 65.5 in | Wt 139.0 lb

## 2016-11-27 DIAGNOSIS — H6122 Impacted cerumen, left ear: Secondary | ICD-10-CM

## 2017-01-03 ENCOUNTER — Encounter: Payer: Self-pay | Admitting: Internal Medicine

## 2017-01-03 NOTE — Progress Notes (Signed)
   Subjective:    Patient ID: [name], male    DOB: 1930/09/11, 83 y.o.   MRN: [id number]  HPI   Here to have left ear irrigated as he realized his hearing is not as good as his right after removal of cerumen impaction from that side about 1 month ago  Current Meds  Medication Sig  . albuterol (PROAIR HFA) 108 (90 Base) MCG/ACT inhaler Inhale 2 puffs into the lungs every 6 (six) hours as needed for wheezing or shortness of breath.  Marland Kitchen amLODipine (NORVASC) 10 MG tablet Take 1 tablet (10 mg total) by mouth every evening.  Marland Kitchen aspirin 81 MG tablet Take 81 mg by mouth daily.  Marland Kitchen atorvastatin (LIPITOR) 40 MG tablet TAKE 1 TABLET BY MOUTH DAILY WITH THE EVENING MEAL  . budesonide-formoterol (SYMBICORT) 160-4.5 MCG/ACT inhaler Inhale 2 puffs into the lungs 2 (two) times daily.  . furosemide (LASIX) 40 MG tablet 1 tab by mouth daily in morning  . hydrALAZINE (APRESOLINE) 100 MG tablet TAKE 1 TABLET(100 MG) BY MOUTH TWICE DAILY  . isosorbide mononitrate (IMDUR) 60 MG 24 hr tablet TAKE 1 TABLET BY MOUTH DAILY  . losartan (COZAAR) 100 MG tablet Take 1 tablet (100 mg total) by mouth every morning.  . metoprolol tartrate (LOPRESSOR) 25 MG tablet TAKE 1 TABLET(25 MG) BY MOUTH TWICE DAILY  . Spacer/Aero-Holding Chambers (AEROCHAMBER PLUS FLO-VU MEDIUM) MISC 1 each by Other route once.    No Known Allergies    Review of Systems     Objective:   Physical Exam Cerumen blocks visualization of left TM.  Irrigation and curettage used to remove moderate amount of cerumen from left ear canal.  Patient tolerated well.  TM appears intact, a bit pink from irrigation.       Assessment & Plan:  Left ear cerumen impaction:  Resolved.  States has great hearing now.

## 2017-01-17 ENCOUNTER — Other Ambulatory Visit: Payer: Self-pay | Admitting: Internal Medicine

## 2017-01-20 ENCOUNTER — Telehealth: Payer: Self-pay | Admitting: Internal Medicine

## 2017-01-20 NOTE — Telephone Encounter (Signed)
Patient called, daughter informed him regarding a recall about blood pressure medicine.  Please call him.

## 2017-01-22 NOTE — Telephone Encounter (Signed)
Spoke with daughter due to patient not understanding which medication was on recall. Daughter states that Losartan is on recall. Patient is currently taking losartan. Daughter would like to know if he should continue taking medication or is there something equivalent to losartan that he can change to taking. Daughter is aware doctor is out of the office until Monday and stated that this can wait until then. Daughter states she will not make any medication changes until she hears from Dr. Amil Amen on Monday.

## 2017-01-25 NOTE — Telephone Encounter (Signed)
Spoke with daughter. Informed okay to continue medication.

## 2017-01-25 NOTE — Telephone Encounter (Signed)
That is just for a certain batch made in [place] of the combination Losartan/HCTZ.  He is okay with the plain Losartan.

## 2017-02-07 ENCOUNTER — Other Ambulatory Visit: Payer: Self-pay | Admitting: Internal Medicine

## 2017-02-07 DIAGNOSIS — I10 Essential (primary) hypertension: Secondary | ICD-10-CM

## 2017-02-07 DIAGNOSIS — I25810 Atherosclerosis of coronary artery bypass graft(s) without angina pectoris: Secondary | ICD-10-CM

## 2017-02-16 ENCOUNTER — Ambulatory Visit: Payer: Medicare Other | Admitting: Internal Medicine

## 2017-03-27 NOTE — Progress Notes (Signed)
No show

## 2017-03-30 ENCOUNTER — Other Ambulatory Visit: Payer: Self-pay | Admitting: Urology

## 2017-03-30 DIAGNOSIS — C61 Malignant neoplasm of prostate: Secondary | ICD-10-CM

## 2017-04-15 ENCOUNTER — Encounter (HOSPITAL_COMMUNITY): Payer: Medicare Other

## 2017-04-15 ENCOUNTER — Encounter (HOSPITAL_COMMUNITY): Payer: Self-pay

## 2017-04-28 ENCOUNTER — Encounter (HOSPITAL_COMMUNITY)
Admission: RE | Admit: 2017-04-28 | Discharge: 2017-04-28 | Disposition: A | Discharge status: Home / Self Care | Payer: Medicare Other | Source: Ambulatory Visit | Attending: Urology | Admitting: Urology

## 2017-04-28 DIAGNOSIS — C61 Malignant neoplasm of prostate: Secondary | ICD-10-CM | POA: Diagnosis present

## 2017-04-28 MED ORDER — TECHNETIUM TC 99M MEDRONATE IV KIT
22.0000 | PACK | Freq: Once | INTRAVENOUS | Status: AC | PRN
  Administered 2017-04-28: 22 via INTRAVENOUS

## 2017-05-12 ENCOUNTER — Telehealth: Payer: Self-pay | Admitting: Internal Medicine

## 2017-05-12 MED ORDER — NITROGLYCERIN 0.4 MG SL SUBL: SUBLINGUAL_TABLET | SUBLINGUAL | 0 refills | Status: DC

## 2017-05-12 NOTE — Telephone Encounter (Signed)
Rx sent to pharmacy   

## 2017-05-12 NOTE — Telephone Encounter (Signed)
Patient has lost nitroGLYCERIN (NITROSTAT) 0.4 mg SL.  Patient would like refill called into [facility] on [address].  [phone] nurse aid, Apolonio Schneiders

## 2017-05-25 ENCOUNTER — Telehealth: Payer: Self-pay | Admitting: Interventional Cardiology

## 2017-05-25 NOTE — Telephone Encounter (Signed)
Pt states he has been having intermittent CP since Sunday.  Denies any CP at this time.  Pt having a couple of episodes a day that are relieved with Nitro.  Had very slight CP this morning at 6:45AM but it has since resolved.  Denies any other sx.  Scheduled pt to come in and see Ermalinda Barrios, PA-C tomorrow at noon.  Advised I would send message to Dr. Tamala Julian to see if further recommendations and would call back if any. Pt aware to proceed to ER if new sx develop or CP unrelieved by Nitro. Pt appreciative for call.

## 2017-05-25 NOTE — Telephone Encounter (Signed)
Pt c/o of Chest Pain: STAT if CP now or developed within 24 hours  1. Are you having CP right now? Not now   2. Are you experiencing any other symptoms (ex. SOB, nausea, vomiting, sweating)? No   3. How long have you been experiencing CP? Since Sunday   4. Is your CP continuous or coming and going? Coming and going   5. Have you taken Nitroglycerin? Yes  ?

## 2017-05-25 NOTE — Telephone Encounter (Signed)
Agree with plan. Thanks

## 2017-05-26 ENCOUNTER — Encounter: Payer: Self-pay | Admitting: *Deleted

## 2017-05-26 ENCOUNTER — Encounter: Payer: Self-pay | Admitting: Physician Assistant

## 2017-05-26 ENCOUNTER — Ambulatory Visit (INDEPENDENT_AMBULATORY_CARE_PROVIDER_SITE_OTHER): Payer: Medicare Other | Admitting: Physician Assistant

## 2017-05-26 VITALS — BP 128/70 | HR 80 | Ht 65.5 in | Wt 133.8 lb

## 2017-05-26 DIAGNOSIS — Z720 Tobacco use: Secondary | ICD-10-CM | POA: Diagnosis not present

## 2017-05-26 DIAGNOSIS — I10 Essential (primary) hypertension: Secondary | ICD-10-CM | POA: Diagnosis not present

## 2017-05-26 DIAGNOSIS — I2510 Atherosclerotic heart disease of native coronary artery without angina pectoris: Secondary | ICD-10-CM | POA: Diagnosis not present

## 2017-05-26 DIAGNOSIS — R002 Palpitations: Secondary | ICD-10-CM | POA: Diagnosis not present

## 2017-05-26 DIAGNOSIS — R079 Chest pain, unspecified: Secondary | ICD-10-CM | POA: Diagnosis not present

## 2017-05-26 DIAGNOSIS — I5032 Chronic diastolic (congestive) heart failure: Secondary | ICD-10-CM | POA: Diagnosis not present

## 2017-05-26 DIAGNOSIS — E782 Mixed hyperlipidemia: Secondary | ICD-10-CM

## 2017-05-26 MED ORDER — ISOSORBIDE MONONITRATE ER 60 MG PO TB24: 120.0000 mg | ORAL_TABLET | Freq: Every day | ORAL | 3 refills | Status: DC
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
## 2017-05-26 NOTE — Patient Instructions (Signed)
Medication Instructions:  Your physician has recommended you make the following change in your medication:  Increase Isosorbide mononitrate to 120 mg by mouth daily.    Labwork: Lab work to be done today--BMP, TSH, CBC  Testing/Procedures: Your physician has requested that you have a lexiscan myoview. For further information please visit [email or web address]. Please follow instruction sheet, as given.  Your physician has recommended that you wear an event monitor. Event monitors are medical devices that record the heart's electrical activity. Doctors most often [place] these monitors to diagnose arrhythmias. Arrhythmias are problems with the speed or rhythm of the heartbeat. The monitor is a small, portable device. You can wear one while you do your normal daily activities. This is usually used to diagnose what is causing palpitations/syncope (passing out).    Follow-Up: Your physician recommends that you schedule a follow-up appointment in: about 6 weeks with Dr. Tamala Julian or Ermalinda Barrios, PA    Any Other Special Instructions Will Be Listed Below (If Applicable).     If you need a refill on your cardiac medications before your next appointment, please call your pharmacy.

## 2017-05-27 LAB — CBC
HEMATOCRIT: 39.5 % (ref 37.5–51.0)
HEMOGLOBIN: 12.4 g/dL — AB (ref 13.0–17.7)
MCH: 29 pg (ref 26.6–33.0)
MCHC: 31.4 g/dL — AB (ref 31.5–35.7)
MCV: 92 fL (ref 79–97)
Platelets: 205 10*3/uL (ref 150–379)
RBC: 4.28 x10E6/uL (ref 4.14–5.80)
RDW: 15.1 % (ref 12.3–15.4)
WBC: 5.6 10*3/uL (ref 3.4–10.8)

## 2017-05-27 LAB — BASIC METABOLIC PANEL
BUN/Creatinine Ratio: 20 (ref 10–24)
BUN: 40 mg/dL — ABNORMAL HIGH (ref 8–27)
CALCIUM: 11.4 mg/dL — AB (ref 8.6–10.2)
CO2: 22 mmol/L (ref 20–29)
Chloride: 97 mmol/L (ref 96–106)
Creatinine, Ser: 2.04 mg/dL — ABNORMAL HIGH (ref 0.76–1.27)
GFR, EST AFRICAN AMERICAN: 34 mL/min/{1.73_m2} — AB (ref >59)
GFR, EST NON AFRICAN AMERICAN: 29 mL/min/{1.73_m2} — AB (ref >59)
Glucose: 98 mg/dL (ref 65–99)
Potassium: 4.2 mmol/L (ref 3.5–5.2)
SODIUM: 139 mmol/L (ref 134–144)

## 2017-05-27 LAB — TSH: TSH: 2.2 u[IU]/mL (ref 0.450–4.500)

## 2017-06-01 ENCOUNTER — Telehealth (HOSPITAL_COMMUNITY): Payer: Self-pay | Admitting: *Deleted

## 2017-06-01 NOTE — Telephone Encounter (Signed)
Patient given detailed instructions per Myocardial Perfusion Study Information Sheet for the test on 06/04/17 at 1100. Patient notified to arrive 15 minutes early and that it is imperative to arrive on time for appointment to keep from having the test rescheduled.  If you need to cancel or reschedule your appointment, please call the office within 24 hours of your appointment. . Patient verbalized understanding.[name], Ranae [place]

## 2017-06-04 ENCOUNTER — Ambulatory Visit (INDEPENDENT_AMBULATORY_CARE_PROVIDER_SITE_OTHER): Payer: Medicare Other

## 2017-06-04 ENCOUNTER — Ambulatory Visit (HOSPITAL_COMMUNITY): Payer: Medicare Other | Attending: Cardiovascular Disease

## 2017-06-04 DIAGNOSIS — Z8673 Personal history of transient ischemic attack (TIA), and cerebral infarction without residual deficits: Secondary | ICD-10-CM | POA: Diagnosis not present

## 2017-06-04 DIAGNOSIS — R079 Chest pain, unspecified: Secondary | ICD-10-CM | POA: Insufficient documentation

## 2017-06-04 DIAGNOSIS — R002 Palpitations: Secondary | ICD-10-CM

## 2017-06-04 DIAGNOSIS — I2510 Atherosclerotic heart disease of native coronary artery without angina pectoris: Secondary | ICD-10-CM | POA: Diagnosis not present

## 2017-06-04 DIAGNOSIS — I10 Essential (primary) hypertension: Secondary | ICD-10-CM | POA: Diagnosis not present

## 2017-06-04 LAB — MYOCARDIAL PERFUSION IMAGING
CHL CUP NUCLEAR SDS: 4
CSEPPHR: 75 {beats}/min
LV dias vol: 94 mL (ref 62–150)
LVSYSVOL: 40 mL
RATE: 0.19
Rest HR: 48 {beats}/min
SRS: 2
SSS: 6
TID: 0.97

## 2017-06-04 MED ORDER — TECHNETIUM TC 99M TETROFOSMIN IV KIT
10.1000 | PACK | Freq: Once | INTRAVENOUS | Status: AC | PRN
  Administered 2017-06-04: 10.1 via INTRAVENOUS
  Filled 2017-06-04: qty 11

## 2017-06-04 MED ORDER — TECHNETIUM TC 99M TETROFOSMIN IV KIT
33.0000 | PACK | Freq: Once | INTRAVENOUS | Status: AC | PRN
  Administered 2017-06-04: 33 via INTRAVENOUS
  Filled 2017-06-04: qty 33

## 2017-06-04 MED ORDER — REGADENOSON 0.4 MG/5ML IV SOLN
0.4000 mg | Freq: Once | INTRAVENOUS | Status: AC
  Administered 2017-06-04: 0.4 mg via INTRAVENOUS

## 2017-06-18 ENCOUNTER — Other Ambulatory Visit (INDEPENDENT_AMBULATORY_CARE_PROVIDER_SITE_OTHER): Payer: Self-pay

## 2017-06-24 LAB — CALCIUM, IONIZED

## 2017-06-24 LAB — PARATHYROID HORMONE, INTACT (NO CA): PTH: 56 pg/mL (ref 15–65)
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
## 2017-07-07 ENCOUNTER — Ambulatory Visit (INDEPENDENT_AMBULATORY_CARE_PROVIDER_SITE_OTHER): Payer: Medicare Other | Admitting: Physician Assistant

## 2017-07-07 ENCOUNTER — Encounter: Payer: Self-pay | Admitting: Physician Assistant

## 2017-07-07 VITALS — BP 122/72 | HR 64 | Ht 67.0 in | Wt 137.0 lb

## 2017-07-07 DIAGNOSIS — Z720 Tobacco use: Secondary | ICD-10-CM

## 2017-07-07 DIAGNOSIS — R0989 Other specified symptoms and signs involving the circulatory and respiratory systems: Secondary | ICD-10-CM

## 2017-07-07 DIAGNOSIS — I2510 Atherosclerotic heart disease of native coronary artery without angina pectoris: Secondary | ICD-10-CM

## 2017-07-07 DIAGNOSIS — I5032 Chronic diastolic (congestive) heart failure: Secondary | ICD-10-CM

## 2017-07-07 DIAGNOSIS — E782 Mixed hyperlipidemia: Secondary | ICD-10-CM | POA: Diagnosis not present

## 2017-07-07 DIAGNOSIS — I10 Essential (primary) hypertension: Secondary | ICD-10-CM

## 2017-07-07 NOTE — Patient Instructions (Signed)
Medication Instructions:  Your physician recommends that you continue on your current medications as directed. Please refer to the Current Medication list given to you today.  Labwork: NONE  Testing/Procedures: Your physician has requested that you have a carotid duplex. This test is an ultrasound of the carotid arteries in your neck. It looks at blood flow through these arteries that supply the brain with blood. Allow one hour for this exam. There are no restrictions or special instructions.  Follow-Up: Your physician wants you to follow-up in: 4 to 5 months with Dr. Tamala Julian.    If you need a refill on your cardiac medications before your next appointment, please call your pharmacy.

## 2017-07-15 ENCOUNTER — Ambulatory Visit (HOSPITAL_COMMUNITY)
Admission: RE | Admit: 2017-07-15 | Discharge: 2017-07-15 | Disposition: A | Discharge status: Home / Self Care | Payer: Medicare Other | Source: Ambulatory Visit | Attending: Cardiovascular Disease | Admitting: Cardiovascular Disease

## 2017-07-15 DIAGNOSIS — E785 Hyperlipidemia, unspecified: Secondary | ICD-10-CM | POA: Diagnosis not present

## 2017-07-15 DIAGNOSIS — I6523 Occlusion and stenosis of bilateral carotid arteries: Secondary | ICD-10-CM | POA: Insufficient documentation

## 2017-07-15 DIAGNOSIS — R0989 Other specified symptoms and signs involving the circulatory and respiratory systems: Secondary | ICD-10-CM

## 2017-07-15 DIAGNOSIS — I10 Essential (primary) hypertension: Secondary | ICD-10-CM | POA: Diagnosis not present

## 2017-07-15 DIAGNOSIS — F17200 Nicotine dependence, unspecified, uncomplicated: Secondary | ICD-10-CM | POA: Diagnosis not present

## 2017-07-15 DIAGNOSIS — I2510 Atherosclerotic heart disease of native coronary artery without angina pectoris: Secondary | ICD-10-CM

## 2017-07-19 NOTE — Progress Notes (Signed)
Pt has been made aware of normal result and verbalized understanding.  jw 07/19/17

## 2017-07-20 ENCOUNTER — Other Ambulatory Visit: Payer: Medicare Other

## 2017-07-24 LAB — CALCIUM, IONIZED: CALCIUM ION: 5.1 mg/dL (ref 4.5–5.6)

## 2017-09-03 ENCOUNTER — Ambulatory Visit: Payer: Medicare Other | Admitting: Interventional Cardiology

## 2017-09-15 ENCOUNTER — Ambulatory Visit (INDEPENDENT_AMBULATORY_CARE_PROVIDER_SITE_OTHER): Payer: Medicare Other | Admitting: Internal Medicine

## 2017-09-15 ENCOUNTER — Encounter: Payer: Self-pay | Admitting: Internal Medicine

## 2017-09-15 VITALS — BP 200/100 | HR 70 | Temp 98.2°F | Resp 12 | Ht 65.5 in | Wt 130.0 lb

## 2017-09-15 DIAGNOSIS — I10 Essential (primary) hypertension: Secondary | ICD-10-CM

## 2017-09-15 DIAGNOSIS — H6123 Impacted cerumen, bilateral: Secondary | ICD-10-CM

## 2017-09-15 MED ORDER — ISOSORBIDE MONONITRATE ER 60 MG PO TB24: ORAL_TABLET | ORAL | 11 refills | Status: DC

## 2017-09-15 NOTE — Progress Notes (Signed)
   Subjective:    Patient ID: [name], male    DOB: 1930-07-21, 84 y.o.   MRN: [id number]  HPI   1.  Mr.  Littles complaint was misunderstood as head congestion.  He actually has right ear cerumen impaction and cannot hear.   2.  Hypertension, et al:  Since his wife [name] has been in the hospital and now in rehab following surgery, no one has been making sure his pill box is filled.  Looked through his meds, which are a jumble and resorted meds.  He has not filled some meds for some time and is completely out of Furosemide.  Many pills left in bottles that should have been done 1 weeks to months ago.  Current Meds  Medication Sig  . albuterol (PROAIR HFA) 108 (90 Base) MCG/ACT inhaler Inhale 2 puffs into the lungs every 6 (six) hours as needed for wheezing or shortness of breath.  Marland Kitchen amLODipine (NORVASC) 10 MG tablet Take 1 tablet (10 mg total) by mouth every evening.  Marland Kitchen aspirin 81 MG tablet Take 81 mg by mouth daily.  Marland Kitchen atorvastatin (LIPITOR) 40 MG tablet TAKE 1 TABLET BY MOUTH DAILY WITH THE EVENING MEAL  . budesonide-formoterol (SYMBICORT) 160-4.5 MCG/ACT inhaler Inhale 2 puffs into the lungs 2 (two) times daily.  . hydrALAZINE (APRESOLINE) 100 MG tablet TAKE 1 TABLET(100 MG) BY MOUTH TWICE DAILY  . isosorbide mononitrate (IMDUR) 60 MG 24 hr tablet 1 tab by mouth twice daily  . losartan (COZAAR) 100 MG tablet Take 1 tablet (100 mg total) by mouth every morning.  . metoprolol tartrate (LOPRESSOR) 25 MG tablet TAKE 1 TABLET(25 MG) BY MOUTH TWICE DAILY  . nitroGLYCERIN (NITROSTAT) 0.4 MG SL tablet PLACE 1 TABLET UNDER THE TONGUE EVERY 5 MINUTES AS NEEDED FOR CHEST PAIN  . Spacer/Aero-Holding Chambers (AEROCHAMBER PLUS FLO-VU MEDIUM) MISC 1 each by Other route once.  . [DISCONTINUED] isosorbide mononitrate (IMDUR) 60 MG 24 hr tablet Take 2 tablets (120 mg total) by mouth daily.    No Known Allergies   Review of Systems     Objective:   Physical Exam NAD HEENT:  Bilateral soft  cerumen impaction:  Irrigated to remove with good results.  Patient able to hear again.  Bilateral TMs pearly gray. Chest:  CTA CV:  RRR without murmur or rub, radial pulses normal and equal LE:  No edema.       Assessment & Plan:  1.  Bilateral Cerumen impaction:  Resolved with irrigation and a bit of curettage.  Patient states hearing was great after cleared.  2.  Hypertension, et al:  Pill box and the larger container where he keeps all of his pills was in significant disarray.  Went through meds with him again and filled box through Wednesday of next week.  He did not have Furosemide so had this filled and delivered to house after hours to add this to his morning pillbox.   Daughter was at home later in evening.  Went over new discharge papers to be certain they understand what pills go in which dosing:  AM vs PM, or both. Bottles also marked with permanent marker on tops to help with this. I did change Imdur to twice daily dosing as he states he gets light headed if he takes both pills at the same time.  Follow up in 4 weeks once back on meds.

## 2017-10-18 ENCOUNTER — Encounter: Payer: Self-pay | Admitting: Internal Medicine

## 2017-10-18 ENCOUNTER — Ambulatory Visit (INDEPENDENT_AMBULATORY_CARE_PROVIDER_SITE_OTHER): Payer: Medicare Other | Admitting: Internal Medicine

## 2017-10-18 VITALS — BP 160/70 | HR 76 | Resp 12 | Ht 65.5 in | Wt 132.0 lb

## 2017-10-18 DIAGNOSIS — I10 Essential (primary) hypertension: Secondary | ICD-10-CM

## 2017-10-18 NOTE — Patient Instructions (Signed)
Bring in you pill box and box of pill bottles to your lab appt please.

## 2017-10-18 NOTE — Progress Notes (Signed)
   Subjective:    Patient ID: [name], male    DOB: Nov 06, 1930, 84 y.o.   MRN: [id number]  HPI   1.  Dizziness after taking Imdur.  We split his dose between morning and evening last visit.  He states he still feels light headed for 2 hours after taking either dose.  No decrease in length or severity.    2.  Essential Hypertension:  Did not bring pill box or container with all pill bottles this time.  He thinks he is taking everything, but his daughter is the one filling his box again.  No chest pain or dyspnea.  No LE edema.   Otherwise, he feels pretty well. His wife, Lenora Boys, is doing better and is home.    They do have an aid that comes out to home, but mainly working with [name] and cooking meals..  Current Meds  Medication Sig  . albuterol (PROAIR HFA) 108 (90 Base) MCG/ACT inhaler Inhale 2 puffs into the lungs every 6 (six) hours as needed for wheezing or shortness of breath.  Marland Kitchen amLODipine (NORVASC) 10 MG tablet Take 1 tablet (10 mg total) by mouth every evening.  Marland Kitchen aspirin 81 MG tablet Take 81 mg by mouth daily.  Marland Kitchen atorvastatin (LIPITOR) 40 MG tablet TAKE 1 TABLET BY MOUTH DAILY WITH THE EVENING MEAL  . budesonide-formoterol (SYMBICORT) 160-4.5 MCG/ACT inhaler Inhale 2 puffs into the lungs 2 (two) times daily.  . hydrALAZINE (APRESOLINE) 100 MG tablet TAKE 1 TABLET(100 MG) BY MOUTH TWICE DAILY  . isosorbide mononitrate (IMDUR) 60 MG 24 hr tablet 1 tab by mouth twice daily  . losartan (COZAAR) 100 MG tablet Take 1 tablet (100 mg total) by mouth every morning.  . metoprolol tartrate (LOPRESSOR) 25 MG tablet TAKE 1 TABLET(25 MG) BY MOUTH TWICE DAILY  . nitroGLYCERIN (NITROSTAT) 0.4 MG SL tablet PLACE 1 TABLET UNDER THE TONGUE EVERY 5 MINUTES AS NEEDED FOR CHEST PAIN  . Spacer/Aero-Holding Chambers (AEROCHAMBER PLUS FLO-VU MEDIUM) MISC 1 each by Other route once.    No Known Allergies Review of Systems     Objective:   Physical Exam NAD Looks well Lungs:  CTA CV: RRR  without murmur or rub.  Radial pulses normal and equal LE:  No edema.        Assessment & Plan:  1.  Essential Hypertension:  Improved, but still not at goal.  He will bring in his pillbox and box full of pill bottles to fasting lab appt in 2-3 days to determine if he is taking meds appropriately.   Looks much better than last month when was missing meds regularly with [name] ill.

## 2017-10-20 ENCOUNTER — Other Ambulatory Visit (INDEPENDENT_AMBULATORY_CARE_PROVIDER_SITE_OTHER): Payer: Self-pay

## 2017-10-20 DIAGNOSIS — Z79899 Other long term (current) drug therapy: Secondary | ICD-10-CM

## 2017-10-20 DIAGNOSIS — Z13220 Encounter for screening for lipoid disorders: Secondary | ICD-10-CM

## 2017-10-21 LAB — CBC WITH DIFFERENTIAL/PLATELET
BASOS ABS: 0.1 10*3/uL (ref 0.0–0.2)
Basos: 1 %
EOS (ABSOLUTE): 2 10*3/uL — AB (ref 0.0–0.4)
Eos: 36 %
Hematocrit: 38.9 % (ref 37.5–51.0)
Hemoglobin: 12.4 g/dL — ABNORMAL LOW (ref 13.0–17.7)
Immature Grans (Abs): 0 10*3/uL (ref 0.0–0.1)
Immature Granulocytes: 0 %
LYMPHS ABS: 1.3 10*3/uL (ref 0.7–3.1)
Lymphs: 23 %
MCH: 28.3 pg (ref 26.6–33.0)
MCHC: 31.9 g/dL (ref 31.5–35.7)
MCV: 89 fL (ref 79–97)
MONOCYTES: 8 %
Monocytes Absolute: 0.4 10*3/uL (ref 0.1–0.9)
Neutrophils Absolute: 1.7 10*3/uL (ref 1.4–7.0)
Neutrophils: 32 %
PLATELETS: 145 10*3/uL — AB (ref 150–450)
RBC: 4.38 x10E6/uL (ref 4.14–5.80)
RDW: 13 % (ref 12.3–15.4)
WBC: 5.5 10*3/uL (ref 3.4–10.8)

## 2017-10-21 LAB — COMPREHENSIVE METABOLIC PANEL
ALK PHOS: 90 IU/L (ref 39–117)
ALT: 7 IU/L (ref 0–44)
AST: 15 IU/L (ref 0–40)
Albumin/Globulin Ratio: 0.8 — ABNORMAL LOW (ref 1.2–2.2)
Albumin: 3.5 g/dL (ref 3.5–4.7)
BILIRUBIN TOTAL: 0.3 mg/dL (ref 0.0–1.2)
BUN/Creatinine Ratio: 12 (ref 10–24)
BUN: 21 mg/dL (ref 8–27)
CHLORIDE: 104 mmol/L (ref 96–106)
CO2: 24 mmol/L (ref 20–29)
Calcium: 10.6 mg/dL — ABNORMAL HIGH (ref 8.6–10.2)
Creatinine, Ser: 1.79 mg/dL — ABNORMAL HIGH (ref 0.76–1.27)
GFR calc Af Amer: 39 mL/min/{1.73_m2} — ABNORMAL LOW (ref >59)
GFR calc non Af Amer: 34 mL/min/{1.73_m2} — ABNORMAL LOW (ref >59)
GLUCOSE: 75 mg/dL (ref 65–99)
Globulin, Total: 4.2 g/dL (ref 1.5–4.5)
POTASSIUM: 4.6 mmol/L (ref 3.5–5.2)
Sodium: 140 mmol/L (ref 134–144)
Total Protein: 7.7 g/dL (ref 6.0–8.5)

## 2017-10-21 LAB — LIPID PANEL W/O CHOL/HDL RATIO
CHOLESTEROL TOTAL: 167 mg/dL (ref 100–199)
HDL: 52 mg/dL (ref >39)
LDL Calculated: 106 mg/dL — ABNORMAL HIGH (ref 0–99)
Triglycerides: 46 mg/dL (ref 0–149)
VLDL CHOLESTEROL CAL: 9 mg/dL (ref 5–40)

## 2017-11-18 ENCOUNTER — Encounter: Payer: Self-pay | Admitting: Interventional Cardiology
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
## 2017-12-01 ENCOUNTER — Ambulatory Visit (INDEPENDENT_AMBULATORY_CARE_PROVIDER_SITE_OTHER): Payer: Medicare Other | Admitting: Interventional Cardiology

## 2017-12-01 ENCOUNTER — Encounter: Payer: Self-pay | Admitting: Interventional Cardiology

## 2017-12-01 VITALS — BP 168/90 | HR 59 | Ht 67.0 in | Wt 136.2 lb

## 2017-12-01 DIAGNOSIS — J449 Chronic obstructive pulmonary disease, unspecified: Secondary | ICD-10-CM | POA: Diagnosis not present

## 2017-12-01 DIAGNOSIS — I10 Essential (primary) hypertension: Secondary | ICD-10-CM | POA: Diagnosis not present

## 2017-12-01 DIAGNOSIS — G119 Hereditary ataxia, unspecified: Secondary | ICD-10-CM

## 2017-12-01 DIAGNOSIS — E7849 Other hyperlipidemia: Secondary | ICD-10-CM | POA: Diagnosis not present

## 2017-12-01 DIAGNOSIS — N183 Chronic kidney disease, stage 3 unspecified: Secondary | ICD-10-CM

## 2017-12-01 DIAGNOSIS — I5032 Chronic diastolic (congestive) heart failure: Secondary | ICD-10-CM

## 2017-12-01 DIAGNOSIS — I2510 Atherosclerotic heart disease of native coronary artery without angina pectoris: Secondary | ICD-10-CM | POA: Diagnosis not present

## 2017-12-01 NOTE — Patient Instructions (Signed)

## 2017-12-26 ENCOUNTER — Other Ambulatory Visit: Payer: Self-pay | Admitting: Internal Medicine

## 2017-12-26 DIAGNOSIS — I10 Essential (primary) hypertension: Secondary | ICD-10-CM

## 2018-01-12 ENCOUNTER — Other Ambulatory Visit: Payer: Self-pay | Admitting: Internal Medicine

## 2018-01-13 ENCOUNTER — Other Ambulatory Visit: Payer: Self-pay | Admitting: Internal Medicine

## 2018-01-13 ENCOUNTER — Other Ambulatory Visit: Payer: Self-pay | Admitting: Physician Assistant

## 2018-01-13 DIAGNOSIS — I10 Essential (primary) hypertension: Secondary | ICD-10-CM

## 2018-01-17 ENCOUNTER — Telehealth: Payer: Self-pay | Admitting: Internal Medicine

## 2018-01-18 NOTE — Telephone Encounter (Signed)
Error

## 2018-01-20 ENCOUNTER — Other Ambulatory Visit: Payer: Self-pay

## 2018-01-20 ENCOUNTER — Encounter (HOSPITAL_COMMUNITY): Payer: Self-pay | Admitting: *Deleted

## 2018-01-20 ENCOUNTER — Observation Stay (HOSPITAL_COMMUNITY)
Admission: EM | Admit: 2018-01-20 | Discharge: 2018-01-21 | Disposition: A | Discharge status: Home / Self Care | Payer: Medicare Other | Attending: Internal Medicine | Admitting: Internal Medicine

## 2018-01-20 ENCOUNTER — Emergency Department (HOSPITAL_COMMUNITY): Payer: Medicare Other

## 2018-01-20 ENCOUNTER — Observation Stay (HOSPITAL_COMMUNITY): Payer: Medicare Other

## 2018-01-20 DIAGNOSIS — F17210 Nicotine dependence, cigarettes, uncomplicated: Secondary | ICD-10-CM | POA: Diagnosis not present

## 2018-01-20 DIAGNOSIS — R079 Chest pain, unspecified: Secondary | ICD-10-CM

## 2018-01-20 DIAGNOSIS — N183 Chronic kidney disease, stage 3 unspecified: Secondary | ICD-10-CM

## 2018-01-20 DIAGNOSIS — I10 Essential (primary) hypertension: Secondary | ICD-10-CM | POA: Diagnosis present

## 2018-01-20 DIAGNOSIS — R0789 Other chest pain: Secondary | ICD-10-CM | POA: Diagnosis not present

## 2018-01-20 DIAGNOSIS — E782 Mixed hyperlipidemia: Secondary | ICD-10-CM | POA: Diagnosis present

## 2018-01-20 DIAGNOSIS — R059 Cough, unspecified: Secondary | ICD-10-CM

## 2018-01-20 DIAGNOSIS — I130 Hypertensive heart and chronic kidney disease with heart failure and stage 1 through stage 4 chronic kidney disease, or unspecified chronic kidney disease: Secondary | ICD-10-CM | POA: Insufficient documentation

## 2018-01-20 DIAGNOSIS — N189 Chronic kidney disease, unspecified: Secondary | ICD-10-CM | POA: Diagnosis not present

## 2018-01-20 DIAGNOSIS — J189 Pneumonia, unspecified organism: Secondary | ICD-10-CM

## 2018-01-20 DIAGNOSIS — R0602 Shortness of breath: Secondary | ICD-10-CM

## 2018-01-20 DIAGNOSIS — J449 Chronic obstructive pulmonary disease, unspecified: Secondary | ICD-10-CM | POA: Diagnosis not present

## 2018-01-20 DIAGNOSIS — I5023 Acute on chronic systolic (congestive) heart failure: Secondary | ICD-10-CM | POA: Diagnosis not present

## 2018-01-20 DIAGNOSIS — I5032 Chronic diastolic (congestive) heart failure: Secondary | ICD-10-CM | POA: Diagnosis present

## 2018-01-20 DIAGNOSIS — I2510 Atherosclerotic heart disease of native coronary artery without angina pectoris: Secondary | ICD-10-CM | POA: Diagnosis present

## 2018-01-20 DIAGNOSIS — Z8546 Personal history of malignant neoplasm of prostate: Secondary | ICD-10-CM

## 2018-01-20 DIAGNOSIS — R05 Cough: Secondary | ICD-10-CM

## 2018-01-20 DIAGNOSIS — E785 Hyperlipidemia, unspecified: Secondary | ICD-10-CM | POA: Insufficient documentation

## 2018-01-20 HISTORY — DX: Other chest pain: R07.89

## 2018-01-20 LAB — CBC WITH DIFFERENTIAL/PLATELET
ABS IMMATURE GRANULOCYTES: 0.04 10*3/uL (ref 0.00–0.07)
Basophils Absolute: 0.1 10*3/uL (ref 0.0–0.1)
Basophils Relative: 1 %
Eosinophils Absolute: 1.1 10*3/uL — ABNORMAL HIGH (ref 0.0–0.5)
Eosinophils Relative: 10 %
HCT: 37.9 % — ABNORMAL LOW (ref 39.0–52.0)
HEMOGLOBIN: 11.8 g/dL — AB (ref 13.0–17.0)
Immature Granulocytes: 0 %
LYMPHS PCT: 16 %
Lymphs Abs: 1.8 10*3/uL (ref 0.7–4.0)
MCH: 27.6 pg (ref 26.0–34.0)
MCHC: 31.1 g/dL (ref 30.0–36.0)
MCV: 88.6 fL (ref 80.0–100.0)
Monocytes Absolute: 0.6 10*3/uL (ref 0.1–1.0)
Monocytes Relative: 5 %
NEUTROS ABS: 7.4 10*3/uL (ref 1.7–7.7)
Neutrophils Relative %: 68 %
Platelets: 274 10*3/uL (ref 150–400)
RBC: 4.28 MIL/uL (ref 4.22–5.81)
RDW: 13.7 % (ref 11.5–15.5)
WBC: 11 10*3/uL — AB (ref 4.0–10.5)
nRBC: 0 % (ref 0.0–0.2)

## 2018-01-20 LAB — CBC
HCT: 32.4 % — ABNORMAL LOW (ref 39.0–52.0)
Hemoglobin: 10.5 g/dL — ABNORMAL LOW (ref 13.0–17.0)
MCH: 28.3 pg (ref 26.0–34.0)
MCHC: 32.4 g/dL (ref 30.0–36.0)
MCV: 87.3 fL (ref 80.0–100.0)
PLATELETS: 243 10*3/uL (ref 150–400)
RBC: 3.71 MIL/uL — ABNORMAL LOW (ref 4.22–5.81)
RDW: 13.3 % (ref 11.5–15.5)
WBC: 7.6 10*3/uL (ref 4.0–10.5)
nRBC: 0 % (ref 0.0–0.2)

## 2018-01-20 LAB — BRAIN NATRIURETIC PEPTIDE: B NATRIURETIC PEPTIDE 5: 427.5 pg/mL — AB (ref 0.0–100.0)

## 2018-01-20 LAB — COMPREHENSIVE METABOLIC PANEL
ALBUMIN: 3.1 g/dL — AB (ref 3.5–5.0)
ALT: 8 U/L (ref 0–44)
AST: 15 U/L (ref 15–41)
Alkaline Phosphatase: 77 U/L (ref 38–126)
Anion gap: 9 (ref 5–15)
BUN: 23 mg/dL (ref 8–23)
CHLORIDE: 106 mmol/L (ref 98–111)
CO2: 22 mmol/L (ref 22–32)
Calcium: 10.6 mg/dL — ABNORMAL HIGH (ref 8.9–10.3)
Creatinine, Ser: 1.65 mg/dL — ABNORMAL HIGH (ref 0.61–1.24)
GFR calc Af Amer: 42 mL/min — ABNORMAL LOW (ref >60)
GFR calc non Af Amer: 36 mL/min — ABNORMAL LOW (ref >60)
GLUCOSE: 100 mg/dL — AB (ref 70–99)
Potassium: 3.7 mmol/L (ref 3.5–5.1)
SODIUM: 137 mmol/L (ref 135–145)
Total Bilirubin: 0.4 mg/dL (ref 0.3–1.2)
Total Protein: 7.8 g/dL (ref 6.5–8.1)

## 2018-01-20 LAB — TROPONIN I
TROPONIN I: 0.07 ng/mL — AB (ref <0.03)
Troponin I: 0.03 ng/mL (ref <0.03)
Troponin I: 0.04 ng/mL (ref <0.03)
Troponin I: 0.07 ng/mL (ref <0.03)

## 2018-01-20 LAB — INFLUENZA PANEL BY PCR (TYPE A & B)
Influenza A By PCR: NEGATIVE
Influenza B By PCR: NEGATIVE

## 2018-01-20 LAB — CREATININE, SERUM
Creatinine, Ser: 1.57 mg/dL — ABNORMAL HIGH (ref 0.61–1.24)
GFR calc Af Amer: 45 mL/min — ABNORMAL LOW (ref >60)
GFR calc non Af Amer: 39 mL/min — ABNORMAL LOW (ref >60)

## 2018-01-20 MED ORDER — ASPIRIN EC 81 MG PO TBEC
81.0000 mg | DELAYED_RELEASE_TABLET | Freq: Every day | ORAL | Status: DC
  Administered 2018-01-20 – 2018-01-21 (×2): 81 mg via ORAL
  Filled 2018-01-20 (×2): qty 1

## 2018-01-20 MED ORDER — ISOSORBIDE MONONITRATE ER 30 MG PO TB24: 60.0000 mg | ORAL_TABLET | Freq: Two times a day (BID) | ORAL | Status: DC

## 2018-01-20 MED ORDER — LEVOFLOXACIN IN D5W 500 MG/100ML IV SOLN
500.0000 mg | Freq: Once | INTRAVENOUS | Status: AC
  Administered 2018-01-20: 500 mg via INTRAVENOUS
  Filled 2018-01-20: qty 100

## 2018-01-20 MED ORDER — METOPROLOL TARTRATE 25 MG PO TABS
25.0000 mg | ORAL_TABLET | Freq: Two times a day (BID) | ORAL | Status: DC
  Administered 2018-01-20 – 2018-01-21 (×2): 25 mg via ORAL
  Filled 2018-01-20 (×3): qty 1

## 2018-01-20 MED ORDER — IPRATROPIUM-ALBUTEROL 0.5-2.5 (3) MG/3ML IN SOLN
3.0000 mL | RESPIRATORY_TRACT | Status: DC
  Administered 2018-01-20: 3 mL via RESPIRATORY_TRACT
  Filled 2018-01-20: qty 3

## 2018-01-20 MED ORDER — ASPIRIN 81 MG PO CHEW
324.0000 mg | CHEWABLE_TABLET | Freq: Once | ORAL | Status: AC
  Administered 2018-01-20: 324 mg via ORAL
  Filled 2018-01-20: qty 4

## 2018-01-20 MED ORDER — LOSARTAN POTASSIUM 50 MG PO TABS
100.0000 mg | ORAL_TABLET | Freq: Every day | ORAL | Status: DC
  Administered 2018-01-20 – 2018-01-21 (×2): 100 mg via ORAL
  Filled 2018-01-20 (×2): qty 2

## 2018-01-20 MED ORDER — AZITHROMYCIN 250 MG PO TABS
500.0000 mg | ORAL_TABLET | Freq: Once | ORAL | Status: AC
  Administered 2018-01-21: 500 mg via ORAL
  Filled 2018-01-20 (×2): qty 2

## 2018-01-20 MED ORDER — ENOXAPARIN SODIUM 40 MG/0.4ML ~~LOC~~ SOLN
40.0000 mg | SUBCUTANEOUS | Status: DC
  Administered 2018-01-20: 40 mg via SUBCUTANEOUS
  Filled 2018-01-20: qty 0.4

## 2018-01-20 MED ORDER — MAGNESIUM SULFATE 2 GM/50ML IV SOLN
INTRAVENOUS | Status: AC
  Filled 2018-01-20: qty 50

## 2018-01-20 MED ORDER — MOMETASONE FURO-FORMOTEROL FUM 200-5 MCG/ACT IN AERO
2.0000 | INHALATION_SPRAY | Freq: Two times a day (BID) | RESPIRATORY_TRACT | Status: DC
  Administered 2018-01-20: 2 via RESPIRATORY_TRACT
  Filled 2018-01-20: qty 8.8

## 2018-01-20 MED ORDER — ACETAMINOPHEN 325 MG PO TABS: 650.0000 mg | ORAL_TABLET | Freq: Four times a day (QID) | ORAL | Status: DC | PRN

## 2018-01-20 MED ORDER — SODIUM CHLORIDE 0.9 % IV SOLN
1.0000 g | INTRAVENOUS | Status: DC
  Administered 2018-01-20: 1 g via INTRAVENOUS
  Filled 2018-01-20 (×2): qty 10

## 2018-01-20 MED ORDER — IPRATROPIUM-ALBUTEROL 0.5-2.5 (3) MG/3ML IN SOLN
RESPIRATORY_TRACT | Status: AC
  Filled 2018-01-20: qty 3

## 2018-01-20 MED ORDER — AMLODIPINE BESYLATE 5 MG PO TABS
10.0000 mg | ORAL_TABLET | Freq: Every day | ORAL | Status: DC
  Administered 2018-01-20 – 2018-01-21 (×2): 10 mg via ORAL
  Filled 2018-01-20 (×2): qty 2

## 2018-01-20 MED ORDER — ISOSORBIDE MONONITRATE ER 30 MG PO TB24
60.0000 mg | ORAL_TABLET | Freq: Every day | ORAL | Status: DC
  Administered 2018-01-20: 60 mg via ORAL
  Filled 2018-01-20: qty 2

## 2018-01-20 MED ORDER — MAGNESIUM SULFATE 2 GM/50ML IV SOLN
2.0000 g | Freq: Once | INTRAVENOUS | Status: AC
  Administered 2018-01-20: 2 g via INTRAVENOUS

## 2018-01-20 MED ORDER — LACTATED RINGERS IV BOLUS
1000.0000 mL | Freq: Once | INTRAVENOUS | Status: AC
  Administered 2018-01-20: 1000 mL via INTRAVENOUS

## 2018-01-20 MED ORDER — FAMOTIDINE IN NACL 20-0.9 MG/50ML-% IV SOLN
20.0000 mg | Freq: Two times a day (BID) | INTRAVENOUS | Status: DC
  Administered 2018-01-20 – 2018-01-21 (×3): 20 mg via INTRAVENOUS
  Filled 2018-01-20 (×3): qty 50

## 2018-01-20 MED ORDER — PREDNISONE 20 MG PO TABS
ORAL_TABLET | ORAL | Status: AC
  Filled 2018-01-20: qty 3

## 2018-01-20 MED ORDER — ATORVASTATIN CALCIUM 20 MG PO TABS
40.0000 mg | ORAL_TABLET | Freq: Every day | ORAL | Status: DC
  Administered 2018-01-20: 40 mg via ORAL
  Filled 2018-01-20: qty 2

## 2018-01-20 MED ORDER — ACETAMINOPHEN 650 MG RE SUPP: 650.0000 mg | Freq: Four times a day (QID) | RECTAL | Status: DC | PRN

## 2018-01-20 MED ORDER — NITROGLYCERIN 0.4 MG SL SUBL: 0.4000 mg | SUBLINGUAL_TABLET | SUBLINGUAL | Status: DC | PRN

## 2018-01-20 MED ORDER — FUROSEMIDE 20 MG PO TABS: 40.0000 mg | ORAL_TABLET | Freq: Every day | ORAL | Status: DC

## 2018-01-20 MED ORDER — PREDNISONE 20 MG PO TABS
60.0000 mg | ORAL_TABLET | Freq: Once | ORAL | Status: AC
  Administered 2018-01-20: 60 mg via ORAL

## 2018-01-20 MED ORDER — FUROSEMIDE 10 MG/ML IJ SOLN
20.0000 mg | Freq: Two times a day (BID) | INTRAMUSCULAR | Status: DC
  Administered 2018-01-20 – 2018-01-21 (×2): 20 mg via INTRAVENOUS
  Filled 2018-01-20 (×2): qty 2

## 2018-01-20 MED ORDER — HYDRALAZINE HCL 50 MG PO TABS
100.0000 mg | ORAL_TABLET | Freq: Two times a day (BID) | ORAL | Status: DC
  Administered 2018-01-20 – 2018-01-21 (×3): 100 mg via ORAL
  Filled 2018-01-20 (×3): qty 2

## 2018-01-20 MED ORDER — IPRATROPIUM-ALBUTEROL 0.5-2.5 (3) MG/3ML IN SOLN: 3.0000 mL | RESPIRATORY_TRACT | Status: DC | PRN
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
## 2018-01-20 NOTE — ED Notes (Signed)
Pt walked in hallway with walker with no issues. O2 stayed at 99
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
## 2018-01-20 NOTE — ED Triage Notes (Signed)
Pt arrived by gems with chest pain sob tonight  Alert no distress

## 2018-01-21 ENCOUNTER — Observation Stay (HOSPITAL_BASED_OUTPATIENT_CLINIC_OR_DEPARTMENT_OTHER): Payer: Medicare Other

## 2018-01-21 DIAGNOSIS — I2510 Atherosclerotic heart disease of native coronary artery without angina pectoris: Secondary | ICD-10-CM | POA: Diagnosis not present

## 2018-01-21 DIAGNOSIS — J440 Chronic obstructive pulmonary disease with acute lower respiratory infection: Secondary | ICD-10-CM

## 2018-01-21 DIAGNOSIS — J449 Chronic obstructive pulmonary disease, unspecified: Secondary | ICD-10-CM

## 2018-01-21 DIAGNOSIS — J209 Acute bronchitis, unspecified: Secondary | ICD-10-CM

## 2018-01-21 DIAGNOSIS — E7849 Other hyperlipidemia: Secondary | ICD-10-CM

## 2018-01-21 DIAGNOSIS — R0789 Other chest pain: Secondary | ICD-10-CM | POA: Diagnosis not present

## 2018-01-21 DIAGNOSIS — I350 Nonrheumatic aortic (valve) stenosis: Secondary | ICD-10-CM | POA: Diagnosis not present

## 2018-01-21 DIAGNOSIS — I10 Essential (primary) hypertension: Secondary | ICD-10-CM

## 2018-01-21 DIAGNOSIS — I25709 Atherosclerosis of coronary artery bypass graft(s), unspecified, with unspecified angina pectoris: Secondary | ICD-10-CM

## 2018-01-21 LAB — ECHOCARDIOGRAM COMPLETE
Height: 66 in
Weight: 2073.6 oz

## 2018-01-21 LAB — CBC
HCT: 30 % — ABNORMAL LOW (ref 39.0–52.0)
HEMOGLOBIN: 9.7 g/dL — AB (ref 13.0–17.0)
MCH: 28 pg (ref 26.0–34.0)
MCHC: 32.3 g/dL (ref 30.0–36.0)
MCV: 86.7 fL (ref 80.0–100.0)
PLATELETS: 216 10*3/uL (ref 150–400)
RBC: 3.46 MIL/uL — AB (ref 4.22–5.81)
RDW: 13.8 % (ref 11.5–15.5)
WBC: 8.4 10*3/uL (ref 4.0–10.5)
nRBC: 0 % (ref 0.0–0.2)

## 2018-01-21 LAB — BASIC METABOLIC PANEL
Anion gap: 7 (ref 5–15)
BUN: 26 mg/dL — AB (ref 8–23)
CHLORIDE: 107 mmol/L (ref 98–111)
CO2: 23 mmol/L (ref 22–32)
Calcium: 9.6 mg/dL (ref 8.9–10.3)
Creatinine, Ser: 2.27 mg/dL — ABNORMAL HIGH (ref 0.61–1.24)
GFR calc Af Amer: 29 mL/min — ABNORMAL LOW (ref >60)
GFR calc non Af Amer: 25 mL/min — ABNORMAL LOW (ref >60)
Glucose, Bld: 97 mg/dL (ref 70–99)
POTASSIUM: 4 mmol/L (ref 3.5–5.1)
Sodium: 137 mmol/L (ref 135–145)

## 2018-01-21 LAB — PROCALCITONIN: Procalcitonin: <0.1 ng/mL

## 2018-01-21 MED ORDER — ISOSORBIDE MONONITRATE ER 60 MG PO TB24
120.0000 mg | ORAL_TABLET | Freq: Every day | ORAL | Status: DC
  Administered 2018-01-21: 120 mg via ORAL
  Filled 2018-01-21: qty 2

## 2018-01-21 MED ORDER — ENOXAPARIN SODIUM 30 MG/0.3ML ~~LOC~~ SOLN: 30.0000 mg | SUBCUTANEOUS | Status: DC

## 2018-01-21 MED ORDER — AZITHROMYCIN 250 MG PO TABS: ORAL_TABLET | ORAL | 0 refills | Status: AC

## 2018-01-21 MED ORDER — ISOSORBIDE MONONITRATE ER 120 MG PO TB24: 120.0000 mg | ORAL_TABLET | Freq: Every day | ORAL | 1 refills | Status: DC

## 2018-01-21 MED ORDER — FAMOTIDINE 20 MG PO TABS: 20.0000 mg | ORAL_TABLET | Freq: Every day | ORAL | Status: DC

## 2018-01-21 NOTE — Progress Notes (Signed)
Discharge instructions reviewed with pt. Pt has no questions at this time IV d/c Pt states he is ready for d/c.

## 2018-01-21 NOTE — Progress Notes (Signed)
Pt's HR went into the 40's.  Pt resting in bed. Denies any SOB, dizziness or pain. Md paged. Will cont to monitor pt.

## 2018-01-21 NOTE — Discharge Summary (Signed)
Physician Discharge Summary  [name] KGU:[id number] DOB: 24-May-1930 DOA: 01/20/2018  PCP: Mack Hook, MD  Admit date: 01/20/2018 Discharge date: 01/21/2018  Admitted From: Home Disposition: Home  Recommendations for Outpatient Follow-up:  1. Follow up with PCP in 1-2 weeks 2. Please obtain BMP/CBC in one week your next doctors visit.  3. Z-Pak prescribed.  Continue using bronchodilators at home 4. Isosorbide increased 120 mg daily and follow-up outpatient with cardiology  Home Health: None Equipment/Devices: None Discharge Condition: Stable CODE STATUS: Full code Diet recommendation: Cardiac  Brief/Interim Summary: 84 year old with a history of CAD status post bare-metal stent, essential hypertension, tobacco use, hyperlipidemia, CKD stage III and CVA in 2012 came to the hospital with complains of chest pain, shortness of breath and slight productive cough.  In the ED he was noted to be slightly tachycardic.  His chest x-ray was suggestive of mild bibasilar opacity concerning for pneumonia versus atelectasis.  He was empirically started on IV Lasix and antibiotics.  Patient was diuresed overnight and was given breathing treatments.  The following day he felt much better.  Cardiology recommended increasing isosorbide 120 mg daily and following up outpatient. This morning patient feels asymptomatic and much better.  He feels back to himself.  He is reached maximum benefit from hospital stay and stable to be discharged with outpatient follow-up recommendations as stated above.  Daughter at bedside during my evaluation.  Discharge Diagnoses:  Principal Problem:   Atypical chest pain Active Problems:   Coronary artery disease   Essential hypertension   Hyperlipidemia   COPD (chronic obstructive pulmonary disease) (HCC)   Chronic diastolic HF (heart failure) (HCC)   History of prostate cancer  Atypical chest pain with slightly elevated troponin Acute bronchitis -  Troponins have remained flat.  Patient has been seen by cardiologist who recommended increasing isosorbide dose to 120 mg daily and following up outpatient. -Patient advised to continue using bronchodilators frequently at home.  Z-Pak has been prescribed. - Currently appears to be euvolemic  Essential hypertension -Resume home medications  Hyperlipidemia -Continue Lipitor  CKD stage III - Creatinine slightly increased with diuresis.  Baseline is around 1.9, today's 2.2.  I recommended holding off his losartan for another 5 to 7 days until seen by primary care provider outpatient.  He needs a repeat BMP done to ensure this has improved and remained stable.  History of CVA with residual ataxia - Resume home meds  On Lovenox while here Full code Discharge today.  Daughter is at the bedside.  Discharge Instructions   Allergies as of 01/21/2018   No Known Allergies     Medication List    TAKE these medications   AEROCHAMBER PLUS FLO-VU MEDIUM Misc 1 each by Other route once.   albuterol 108 (90 Base) MCG/ACT inhaler Commonly known as:  PROVENTIL HFA;VENTOLIN HFA Inhale 2 puffs into the lungs every 6 (six) hours as needed for wheezing or shortness of breath.   amLODipine 10 MG tablet Commonly known as:  NORVASC TAKE 1 TABLET(10 MG) BY MOUTH EVERY EVENING What changed:    See the new instructions.  Another medication with the same name was removed. Continue taking this medication, and follow the directions you see here.   aspirin 81 MG tablet Take 81 mg by mouth daily.   atorvastatin 40 MG tablet Commonly known as:  LIPITOR TAKE 1 TABLET BY MOUTH DAILY WITH THE EVENING MEAL What changed:    See the new instructions.  Another medication with the same name  was removed. Continue taking this medication, and follow the directions you see here.   azithromycin 250 MG tablet Commonly known as:  ZITHROMAX Take 2 tablets (500 mg) on  Day 1,  followed by 1 tablet (250 mg)  once daily on Days 2 through 5.   budesonide-formoterol 160-4.5 MCG/ACT inhaler Commonly known as:  SYMBICORT Inhale 2 puffs into the lungs 2 (two) times daily.   furosemide 40 MG tablet Commonly known as:  LASIX 1 tab by mouth daily in morning What changed:    how much to take  how to take this  when to take this  additional instructions   hydrALAZINE 100 MG tablet Commonly known as:  APRESOLINE TAKE 1 TABLET(100 MG) BY MOUTH TWICE DAILY What changed:    See the new instructions.  Another medication with the same name was removed. Continue taking this medication, and follow the directions you see here.   isosorbide mononitrate 120 MG 24 hr tablet Commonly known as:  IMDUR Take 1 tablet (120 mg total) by mouth daily. Start taking on:  01/22/2018 What changed:    medication strength  how much to take  how to take this  when to take this  additional instructions   losartan 100 MG tablet Commonly known as:  COZAAR TAKE 1 TABLET(100 MG) BY MOUTH EVERY MORNING What changed:  See the new instructions.   metoprolol tartrate 25 MG tablet Commonly known as:  LOPRESSOR TAKE 1 TABLET(25 MG) BY MOUTH TWICE DAILY What changed:    See the new instructions.  Another medication with the same name was removed. Continue taking this medication, and follow the directions you see here.   nitroGLYCERIN 0.4 MG SL tablet Commonly known as:  NITROSTAT PLACE 1 TABLET UNDER THE TONGUE EVERY 5 MINUTES AS NEEDED FOR CHEST PAIN, SEEK MEDICAL ATTENTION IF NO RELIEF What changed:    how much to take  how to take this  when to take this  reasons to take this  additional instructions      Follow-up Information    Mack Hook, MD. Schedule an appointment as soon as possible for a visit in 1 week(s).   Specialty:  Internal Medicine Contact information: [place] [address] [phone]        Belva Crome, MD .   Specialty:   Cardiology Contact information: [phone] [place] [address] [phone]          No Known Allergies  You were cared for by a hospitalist during your hospital stay. If you have any questions about your discharge medications or the care you received while you were in the hospital after you are discharged, you can call the unit and asked to speak with the hospitalist on call if the hospitalist that took care of you is not available. Once you are discharged, your primary care physician will handle any further medical issues. Please note that no refills for any discharge medications will be authorized once you are discharged, as it is imperative that you return to your primary care physician (or establish a relationship with a primary care physician if you do not have one) for your aftercare needs so that they can reassess your need for medications and monitor your lab values.  Consultations:  Cardiology   Procedures/Studies: Dg Chest Port 1 View  Result Date: 01/20/2018 CLINICAL DATA:  Acute onset of shortness of breath. EXAM: PORTABLE CHEST 1 VIEW COMPARISON:  Chest radiograph performed 04/26/2016  FINDINGS: The lungs are well-aerated. Mild bibasilar airspace opacity may reflect atelectasis or mild pneumonia, depending on the patient's symptoms. There is no evidence of pleural effusion or pneumothorax. The cardiomediastinal silhouette is within normal limits. No acute osseous abnormalities are seen. IMPRESSION: Mild bibasilar airspace opacity may reflect atelectasis or mild pneumonia, depending on the patient's symptoms. Electronically Signed   By: Garald Balding M.D.   On: 01/20/2018 02:15     Subjective: No complaints, feels better.   General = no fevers, chills, dizziness, malaise, fatigue HEENT/EYES = negative for pain, redness, loss of vision, double vision, blurred vision, loss of hearing, sore throat, hoarseness, dysphagia Cardiovascular= negative for  chest pain, palpitation, murmurs, lower extremity swelling Respiratory/lungs= negative for shortness of breath, cough, hemoptysis, wheezing, mucus production Gastrointestinal= negative for nausea, vomiting,, abdominal pain, melena, hematemesis Genitourinary= negative for Dysuria, Hematuria, Change in Urinary Frequency MSK = Negative for arthralgia, myalgias, Back Pain, Joint swelling  Neurology= Negative for headache, seizures, numbness, tingling  Psychiatry= Negative for anxiety, depression, suicidal and homocidal ideation Allergy/Immunology= Medication/Food allergy as listed  Skin= Negative for Rash, lesions, ulcers, itching    Discharge Exam: Vitals:   01/21/18 0601 01/21/18 0900  BP: (!) 164/64 (!) 142/56  Pulse: (!) 53 (!) 53  Resp:    Temp: 98.5 F (36.9 C)   SpO2: 100% 94%   Vitals:   01/20/18 2043 01/20/18 2138 01/21/18 0601 01/21/18 0900  BP:  (!) 130/57 (!) 164/64 (!) 142/56  Pulse:   (!) 53 (!) 53  Resp:      Temp:   98.5 F (36.9 C)   TempSrc:   Oral   SpO2: 96%  100% 94%  Weight:   58.8 kg   Height:        General: Pt is alert, awake, not in acute distress Cardiovascular: RRR, S1/S2 +, no rubs, no gallops Respiratory: slightlyrhonchi at the bases but improved.  Abdominal: Soft, NT, ND, bowel sounds + Extremities: no edema, no cyanosis    The results of significant diagnostics from this hospitalization (including imaging, microbiology, ancillary and laboratory) are listed below for reference.     Microbiology: No results found for this or any previous visit (from the past 240 hour(s)).   Labs: BNP (last 3 results) Recent Labs    01/20/18 0104  BNP 878.6*   Basic Metabolic Panel: Recent Labs  Lab 01/20/18 0104 01/20/18 0830 01/21/18 0435  NA 137  --  137  K 3.7  --  4.0  CL 106  --  107  CO2 22  --  23  GLUCOSE 100*  --  97  BUN 23  --  26*  CREATININE 1.65* 1.57* 2.27*  CALCIUM 10.6*  --  9.6   Liver Function Tests: Recent Labs  Lab  01/20/18 0104  AST 15  ALT 8  ALKPHOS 77  BILITOT 0.4  PROT 7.8  ALBUMIN 3.1*   No results for input(s): LIPASE, AMYLASE in the last 168 hours. No results for input(s): AMMONIA in the last 168 hours. CBC: Recent Labs  Lab 01/20/18 0104 01/20/18 0830 01/21/18 0435  WBC 11.0* 7.6 8.4  NEUTROABS 7.4  --   --   HGB 11.8* 10.5* 9.7*  HCT 37.9* 32.4* 30.0*  MCV 88.6 87.3 86.7  PLT 274 243 216   Cardiac Enzymes: Recent Labs  Lab 01/20/18 0104 01/20/18 0409 01/20/18 0830 01/20/18 1546 01/20/18 1922  TROPONINI <0.03 0.03* 0.04* 0.07* 0.07*   BNP: Invalid input(s): POCBNP CBG: No results  for input(s): GLUCAP in the last 168 hours. D-Dimer No results for input(s): DDIMER in the last 72 hours. Hgb A1c No results for input(s): HGBA1C in the last 72 hours. Lipid Profile No results for input(s): CHOL, HDL, LDLCALC, TRIG, CHOLHDL, LDLDIRECT in the last 72 hours. Thyroid function studies No results for input(s): TSH, T4TOTAL, T3FREE, THYROIDAB in the last 72 hours.  Invalid input(s): FREET3 Anemia work up No results for input(s): VITAMINB12, FOLATE, FERRITIN, TIBC, IRON, RETICCTPCT in the last 72 hours. Urinalysis    Component Value Date/Time   COLORURINE YELLOW 02/11/2014 1106   APPEARANCEUR CLEAR 02/11/2014 1106   LABSPEC 1.024 02/11/2014 1106   PHURINE 5.0 02/11/2014 1106   GLUCOSEU NEGATIVE 02/11/2014 1106   HGBUR NEGATIVE 02/11/2014 1106   BILIRUBINUR NEGATIVE 02/11/2014 1106   KETONESUR NEGATIVE 02/11/2014 1106   PROTEINUR 100 (A) 02/11/2014 1106   UROBILINOGEN 1.0 02/11/2014 1106   NITRITE NEGATIVE 02/11/2014 1106   LEUKOCYTESUR NEGATIVE 02/11/2014 1106   Sepsis Labs Invalid input(s): PROCALCITONIN,  WBC,  LACTICIDVEN Microbiology No results found for this or any previous visit (from the past 240 hour(s)).   Time coordinating discharge:  I have spent 35 minutes face to face with the patient and on the ward discussing the patients care, assessment, plan  and disposition with other care givers. >50% of the time was devoted counseling the patient about the risks and benefits of treatment/Discharge disposition and coordinating care.   SIGNED:   Damita Lack, MD  [facility] 01/21/2018, 2:41 PM Pager   If 7PM-7AM, please contact night-coverage [email or web address] Password [email or web address]
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
## 2018-01-21 NOTE — Progress Notes (Signed)
  Echocardiogram 2D Echocardiogram has been performed.  Darlina Sicilian M 01/21/2018, 8:18 AM

## 2018-01-27 ENCOUNTER — Ambulatory Visit: Payer: Medicare Other | Admitting: Internal Medicine

## 2018-01-27 ENCOUNTER — Encounter: Payer: Self-pay | Admitting: Internal Medicine

## 2018-01-27 VITALS — BP 200/120 | HR 62 | Resp 12 | Ht 65.5 in | Wt 130.0 lb

## 2018-01-27 DIAGNOSIS — D649 Anemia, unspecified: Secondary | ICD-10-CM

## 2018-01-27 DIAGNOSIS — N183 Chronic kidney disease, stage 3 unspecified: Secondary | ICD-10-CM

## 2018-01-27 DIAGNOSIS — J40 Bronchitis, not specified as acute or chronic: Secondary | ICD-10-CM

## 2018-01-27 DIAGNOSIS — J449 Chronic obstructive pulmonary disease, unspecified: Secondary | ICD-10-CM

## 2018-01-27 DIAGNOSIS — I5032 Chronic diastolic (congestive) heart failure: Secondary | ICD-10-CM

## 2018-01-27 MED ORDER — NICOTINE POLACRILEX 2 MG MT LOZG: 2.0000 mg | LOZENGE | OROMUCOSAL | 6 refills | Status: DC | PRN

## 2018-01-27 NOTE — Progress Notes (Signed)
Subjective:    Patient ID: [name], male    DOB: 02-26-1931, 84 y.o.   MRN: [id number]  HPI   Hospitalized 11/14 through the next day for atypical CP with flat mild elevation in Troponin and CHF.   Patient states he developed left anterior chest discomfort he described as burning with dyspnea.  The difficulty breathing was his main complaint.   He was treated for possible pneumonia as well with Azithromycin.  Was diuresed and recommended to use inhalers regularly and increase Isosorbide to 120 mg daily. He does not know what meds he is taking and did not bring them with him again today.    States he is using the Symbicort twice daily.  Using rescue inhaler about twice daily since home from hospital.  Breathing much better now.  Still with mucous in his throat.  Cough is not as productive.   He is not sure if he is still taking the antibiotic, Azithromycin.  States his daughter has his pill box filled. He thinks they obtained the higher dosing of his isosorbide.   He has not had any chest discomfort since home.  No dyspnea as well.    Still smoking.  States he only smoked once since home--states he smoked last evening.  When last at his home to see his wife, just before he became ill, he was in the bathroom smoking in a house with temp set in mid to high 80s.   Have been after him to consider nicotine patches or lozenges, but cannot get him to utilize.   States he usually smokes about 1/2 ppd prior to illness.   He might consider using nicotine lozenges instead of cigarettes. Wife, Mabel Chong Sicilian) does not smoke.  Do not see that he received his flu vaccine at hospital.   He has not taken any of his medication today.  States he had errands to run and did not take before leaving the house.   Again, not clear what meds he is taking currently.  Current Meds  Medication Sig  . albuterol (PROAIR HFA) 108 (90 Base) MCG/ACT inhaler Inhale 2 puffs into the lungs every 6 (six) hours as  needed for wheezing or shortness of breath.  Marland Kitchen amLODipine (NORVASC) 10 MG tablet TAKE 1 TABLET(10 MG) BY MOUTH EVERY EVENING (Patient taking differently: Take 10 mg by mouth daily. )  . aspirin 81 MG tablet Take 81 mg by mouth daily.  Marland Kitchen atorvastatin (LIPITOR) 40 MG tablet TAKE 1 TABLET BY MOUTH DAILY WITH THE EVENING MEAL (Patient taking differently: Take 40 mg by mouth daily at 6 PM. )  . budesonide-formoterol (SYMBICORT) 160-4.5 MCG/ACT inhaler Inhale 2 puffs into the lungs 2 (two) times daily.  . furosemide (LASIX) 40 MG tablet 1 tab by mouth daily in morning (Patient taking differently: Take 40 mg by mouth daily. )  . hydrALAZINE (APRESOLINE) 100 MG tablet TAKE 1 TABLET(100 MG) BY MOUTH TWICE DAILY (Patient taking differently: Take 100 mg by mouth 2 (two) times daily. )  . isosorbide mononitrate (IMDUR) 120 MG 24 hr tablet Take 1 tablet (120 mg total) by mouth daily.  . metoprolol tartrate (LOPRESSOR) 25 MG tablet TAKE 1 TABLET(25 MG) BY MOUTH TWICE DAILY (Patient taking differently: Take 25 mg by mouth 2 (two) times daily. )  . nitroGLYCERIN (NITROSTAT) 0.4 MG SL tablet PLACE 1 TABLET UNDER THE TONGUE EVERY 5 MINUTES AS NEEDED FOR CHEST PAIN, SEEK MEDICAL ATTENTION IF NO RELIEF (Patient taking differently: Place 0.4 mg under  the tongue every 5 (five) minutes as needed for chest pain. )  . Spacer/Aero-Holding Chambers (AEROCHAMBER PLUS FLO-VU MEDIUM) MISC 1 each by Other route once.    No Known Allergies     Review of Systems     Objective:   Physical Exam  NAD, crackly congested cough from throat at times.   Usual unsteady gait Smells strongly of cigarette smoke. HEENT:  PERRL, EOMI, throat without injection, TMs pearly gray.  MMM Neck:  Supple, No adenopathy Chest:  Few dry crackles at left base, rest is clear with good air movement CV:  RRR without murmur or rub.  Radial and DP pulses normal and equal. Abd: S, NT     Assessment & Plan:  1.  Pneumonitis and CHF:  Ultimately  got hold of daughter, [name].  She states he completed the Azithromycin course and is taking the long acting isosorbide now. She also states he is frequently skipping taking meds.  Could not get him to give an answer as to why he is missing. Discussed if his bp does not stay down, he will redevelop symptoms of CHF. Attempted to draw labs here, but not successful--sending to [place] drawing station.  BMP, CBC  2.  Hypertension:  Skipping meds as above.  If creatinine improving, will restart Losartan.  3.  Tobacco abuse with pneumonitis and COPD, CHF.  He has not obtained nicotine patches in the past.   Try nicotine lozenges instead.  Good Rx coupon given and to use if Medicare does not cover.   Discussed not good for wife, Chong Sicilian,  to have the smoke in their home as well.

## 2018-01-29 LAB — BASIC METABOLIC PANEL
BUN/Creatinine Ratio: 16 (ref 10–24)
BUN: 31 mg/dL — AB (ref 8–27)
CHLORIDE: 95 mmol/L — AB (ref 96–106)
CO2: 22 mmol/L (ref 20–29)
CREATININE: 2 mg/dL — AB (ref 0.76–1.27)
Calcium: 11 mg/dL — ABNORMAL HIGH (ref 8.6–10.2)
GFR calc Af Amer: 34 mL/min/{1.73_m2} — ABNORMAL LOW (ref >59)
GFR calc non Af Amer: 30 mL/min/{1.73_m2} — ABNORMAL LOW (ref >59)
GLUCOSE: 78 mg/dL (ref 65–99)
Potassium: 4.5 mmol/L (ref 3.5–5.2)
Sodium: 133 mmol/L — ABNORMAL LOW (ref 134–144)

## 2018-01-29 LAB — CBC WITH DIFFERENTIAL/PLATELET
BASOS ABS: 0.1 10*3/uL (ref 0.0–0.2)
Basos: 1 %
EOS (ABSOLUTE): 0.8 10*3/uL — ABNORMAL HIGH (ref 0.0–0.4)
Eos: 15 %
HEMOGLOBIN: 12.1 g/dL — AB (ref 13.0–17.7)
Hematocrit: 37.7 % (ref 37.5–51.0)
IMMATURE GRANS (ABS): 0 10*3/uL (ref 0.0–0.1)
Immature Granulocytes: 0 %
LYMPHS: 23 %
Lymphocytes Absolute: 1.2 10*3/uL (ref 0.7–3.1)
MCH: 27.9 pg (ref 26.6–33.0)
MCHC: 32.1 g/dL (ref 31.5–35.7)
MCV: 87 fL (ref 79–97)
MONOCYTES: 11 %
Monocytes Absolute: 0.6 10*3/uL (ref 0.1–0.9)
Neutrophils Absolute: 2.6 10*3/uL (ref 1.4–7.0)
Neutrophils: 50 %
PLATELETS: 269 10*3/uL (ref 150–450)
RBC: 4.33 x10E6/uL (ref 4.14–5.80)
RDW: 13.1 % (ref 12.3–15.4)
WBC: 5.2 10*3/uL (ref 3.4–10.8)

## 2018-02-09 ENCOUNTER — Other Ambulatory Visit: Payer: Self-pay | Admitting: Internal Medicine

## 2018-02-10 ENCOUNTER — Other Ambulatory Visit (INDEPENDENT_AMBULATORY_CARE_PROVIDER_SITE_OTHER): Payer: Medicare Other

## 2018-02-10 DIAGNOSIS — N183 Chronic kidney disease, stage 3 unspecified: Secondary | ICD-10-CM

## 2018-02-11 LAB — BASIC METABOLIC PANEL
BUN / CREAT RATIO: 10 (ref 10–24)
BUN: 17 mg/dL (ref 8–27)
CALCIUM: 10.5 mg/dL — AB (ref 8.6–10.2)
CO2: 23 mmol/L (ref 20–29)
Chloride: 98 mmol/L (ref 96–106)
Creatinine, Ser: 1.71 mg/dL — ABNORMAL HIGH (ref 0.76–1.27)
GFR, EST AFRICAN AMERICAN: 41 mL/min/{1.73_m2} — AB (ref >59)
GFR, EST NON AFRICAN AMERICAN: 36 mL/min/{1.73_m2} — AB (ref >59)
Glucose: 83 mg/dL (ref 65–99)
Potassium: 4.7 mmol/L (ref 3.5–5.2)
SODIUM: 134 mmol/L (ref 134–144)

## 2018-02-14 ENCOUNTER — Other Ambulatory Visit: Payer: Self-pay | Admitting: Internal Medicine

## 2018-02-16 ENCOUNTER — Ambulatory Visit (INDEPENDENT_AMBULATORY_CARE_PROVIDER_SITE_OTHER): Payer: Medicare Other | Admitting: Cardiology

## 2018-02-16 ENCOUNTER — Encounter: Payer: Self-pay | Admitting: Cardiology

## 2018-02-16 VITALS — BP 180/76 | HR 58 | Ht 65.5 in | Wt 136.1 lb

## 2018-02-16 DIAGNOSIS — N183 Chronic kidney disease, stage 3 unspecified: Secondary | ICD-10-CM

## 2018-02-16 DIAGNOSIS — I10 Essential (primary) hypertension: Secondary | ICD-10-CM

## 2018-02-16 DIAGNOSIS — I2510 Atherosclerotic heart disease of native coronary artery without angina pectoris: Secondary | ICD-10-CM | POA: Diagnosis not present

## 2018-02-16 DIAGNOSIS — Z720 Tobacco use: Secondary | ICD-10-CM

## 2018-02-16 DIAGNOSIS — I5032 Chronic diastolic (congestive) heart failure: Secondary | ICD-10-CM | POA: Diagnosis not present

## 2018-02-16 DIAGNOSIS — E7849 Other hyperlipidemia: Secondary | ICD-10-CM

## 2018-02-16 NOTE — Patient Instructions (Signed)
Medication Instructions:  Your physician recommends that you continue on your current medications as directed. Please refer to the Current Medication list given to you today.  If you need a refill on your cardiac medications before your next appointment, please call your pharmacy.   Lab work: None  If you have labs (blood work) drawn today and your tests are completely normal, you will receive your results only by: Marland Kitchen MyChart Message (if you have MyChart) OR . A paper copy in the mail If you have any lab test that is abnormal or we need to change your treatment, we will call you to review the results.  Testing/Procedures: None  Follow-Up: At [facility], you and your health needs are our priority.  As part of our continuing mission to provide you with exceptional heart care, we have created designated Provider Care Teams.  These Care Teams include your primary Cardiologist (physician) and Advanced Practice Providers (APPs -  Physician Assistants and Nurse Practitioners) who all work together to provide you with the care you need, when you need it. You will need a follow up appointment in 6 months.  Please call our office 2 months in advance to schedule this appointment.  You may see Sinclair Grooms, MD or one of the following Advanced Practice Providers on your designated Care Team:   Truitt Merle, NP Cecilie Kicks, NP . Kathyrn Drown, NP  Any Other Special Instructions Will Be Listed Below (If Applicable).  Low-Sodium Eating Plan Sodium, which is an element that makes up salt, helps you maintain a healthy balance of fluids in your body. Too much sodium can increase your blood pressure and cause fluid and waste to be held in your body. Your health care provider or dietitian may recommend following this plan if you have high blood pressure (hypertension), kidney disease, liver disease, or heart failure. Eating less sodium can help lower your blood pressure, reduce swelling, and protect  your heart, liver, and kidneys. What are tips for following this plan? General guidelines  Most people on this plan should limit their sodium intake to 1,500-2,000 mg (milligrams) of sodium each day. Reading food labels  The Nutrition Facts label lists the amount of sodium in one serving of the food. If you eat more than one serving, you must multiply the listed amount of sodium by the number of servings.  Choose foods with less than 140 mg of sodium per serving.  Avoid foods with 300 mg of sodium or more per serving. Shopping  Look for lower-sodium products, often labeled as "low-sodium" or "no salt added."  Always check the sodium content even if foods are labeled as "unsalted" or "no salt added".  Buy fresh foods. ? Avoid canned foods and premade or frozen meals. ? Avoid canned, cured, or processed meats  Buy breads that have less than 80 mg of sodium per slice. Cooking  Eat more home-cooked food and less restaurant, buffet, and fast food.  Avoid adding salt when cooking. Use salt-free seasonings or herbs instead of table salt or sea salt. Check with your health care provider or pharmacist before using salt substitutes.  Cook with plant-based oils, such as canola, sunflower, or olive oil. Meal planning  When eating at a restaurant, ask that your food be prepared with less salt or no salt, if possible.  Avoid foods that contain MSG (monosodium glutamate). MSG is sometimes added to [place] food, bouillon, and some canned foods. What foods are recommended? The items listed may not  be a complete list. Talk with your dietitian about what dietary choices are best for you. Grains Low-sodium cereals, including oats, puffed wheat and rice, and shredded wheat. Low-sodium crackers. Unsalted rice. Unsalted pasta. Low-sodium bread. Whole-grain breads and whole-grain pasta. Vegetables Fresh or frozen vegetables. "No salt added" canned vegetables. "No salt added" tomato sauce and paste.  Low-sodium or reduced-sodium tomato and vegetable juice. Fruits Fresh, frozen, or canned fruit. Fruit juice. Meats and other protein foods Fresh or frozen (no salt added) meat, poultry, seafood, and fish. Low-sodium canned tuna and salmon. Unsalted nuts. Dried peas, beans, and lentils without added salt. Unsalted canned beans. Eggs. Unsalted nut butters. Dairy Milk. Soy milk. Cheese that is naturally low in sodium, such as ricotta cheese, fresh mozzarella, or Swiss cheese Low-sodium or reduced-sodium cheese. Cream cheese. Yogurt. Fats and oils Unsalted butter. Unsalted margarine with no trans fat. Vegetable oils such as canola or olive oils. Seasonings and other foods Fresh and dried herbs and spices. Salt-free seasonings. Low-sodium mustard and ketchup. Sodium-free salad dressing. Sodium-free light mayonnaise. Fresh or refrigerated horseradish. Lemon juice. Vinegar. Homemade, reduced-sodium, or low-sodium soups. Unsalted popcorn and pretzels. Low-salt or salt-free chips. What foods are not recommended? The items listed may not be a complete list. Talk with your dietitian about what dietary choices are best for you. Grains Instant hot cereals. Bread stuffing, pancake, and biscuit mixes. Croutons. Seasoned rice or pasta mixes. Noodle soup cups. Boxed or frozen macaroni and cheese. Regular salted crackers. Self-rising flour. Vegetables Sauerkraut, pickled vegetables, and relishes. Olives. [place] fries. Onion rings. Regular canned vegetables (not low-sodium or reduced-sodium). Regular canned tomato sauce and paste (not low-sodium or reduced-sodium). Regular tomato and vegetable juice (not low-sodium or reduced-sodium). Frozen vegetables in sauces. Meats and other protein foods Meat or fish that is salted, canned, smoked, spiced, or pickled. Bacon, ham, sausage, hotdogs, corned beef, chipped beef, packaged lunch meats, salt pork, jerky, pickled herring, anchovies, regular canned tuna, sardines, salted  nuts. Dairy Processed cheese and cheese spreads. Cheese curds. Blue cheese. Feta cheese. String cheese. Regular cottage cheese. Buttermilk. Canned milk. Fats and oils Salted butter. Regular margarine. Ghee. Bacon fat. Seasonings and other foods Onion salt, garlic salt, seasoned salt, table salt, and sea salt. Canned and packaged gravies. Worcestershire sauce. Tartar sauce. Barbecue sauce. Teriyaki sauce. Soy sauce, including reduced-sodium. Steak sauce. Fish sauce. Oyster sauce. Cocktail sauce. Horseradish that you find on the shelf. Regular ketchup and mustard. Meat flavorings and tenderizers. Bouillon cubes. Hot sauce and Tabasco sauce. Premade or packaged marinades. Premade or packaged taco seasonings. Relishes. Regular salad dressings. Salsa. Potato and tortilla chips. Corn chips and puffs. Salted popcorn and pretzels. Canned or dried soups. Pizza. Frozen entrees and pot pies. Summary  Eating less sodium can help lower your blood pressure, reduce swelling, and protect your heart, liver, and kidneys.  Most people on this plan should limit their sodium intake to 1,500-2,000 mg (milligrams) of sodium each day.  Canned, boxed, and frozen foods are high in sodium. Restaurant foods, fast foods, and pizza are also very high in sodium. You also get sodium by adding salt to food.  Try to cook at home, eat more fresh fruits and vegetables, and eat less fast food, canned, processed, or prepared foods. This information is not intended to replace advice given to you by your health care provider. Make sure you discuss any questions you have with your health care provider. Document Released: 08/15/2001 Document Revised: 02/17/2016 Document Reviewed: 02/17/2016 Else[employer] Interactive Patient Education  2018  [employer].
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
## 2018-02-17 ENCOUNTER — Ambulatory Visit: Payer: Medicare Other | Admitting: Physician Assistant

## 2018-03-08 ENCOUNTER — Other Ambulatory Visit: Payer: Self-pay | Admitting: Internal Medicine

## 2018-03-08 DIAGNOSIS — I10 Essential (primary) hypertension: Secondary | ICD-10-CM

## 2018-03-11 ENCOUNTER — Other Ambulatory Visit: Payer: Self-pay | Admitting: Internal Medicine

## 2018-04-29 ENCOUNTER — Ambulatory Visit: Payer: Medicare Other | Admitting: Internal Medicine

## 2018-05-19 ENCOUNTER — Other Ambulatory Visit: Payer: Self-pay | Admitting: Internal Medicine

## 2018-05-25 ENCOUNTER — Other Ambulatory Visit: Payer: Self-pay

## 2018-05-25 MED ORDER — ISOSORBIDE MONONITRATE ER 120 MG PO TB24: 120.0000 mg | ORAL_TABLET | Freq: Every day | ORAL | 11 refills | Status: DC

## 2018-06-21 ENCOUNTER — Telehealth: Payer: Self-pay

## 2018-06-21 MED ORDER — FLUTICASONE PROPIONATE 50 MCG/ACT NA SUSP: 2.0000 | Freq: Every day | NASAL | 6 refills | Status: DC

## 2018-06-21 NOTE — Telephone Encounter (Signed)
Patient called stating he has had head congestion x 1 week. Patient denies any fever, cough, runny nose or body aches. Patient states he has not taken anything for his congestion.  Per Dr. Amil Amen call in flonase. 2 sprays in each nostril daily. Patient informed and verbalized understanding. Rx called into [facility] [address]

## 2018-10-12 ENCOUNTER — Other Ambulatory Visit: Payer: Self-pay | Admitting: Internal Medicine

## 2019-01-20 ENCOUNTER — Ambulatory Visit (INDEPENDENT_AMBULATORY_CARE_PROVIDER_SITE_OTHER): Payer: Medicare Other

## 2019-01-20 DIAGNOSIS — Z23 Encounter for immunization: Secondary | ICD-10-CM

## 2019-01-25 ENCOUNTER — Other Ambulatory Visit: Payer: Self-pay | Admitting: Internal Medicine

## 2019-02-27 ENCOUNTER — Other Ambulatory Visit: Payer: Self-pay | Admitting: Internal Medicine

## 2019-03-25 ENCOUNTER — Other Ambulatory Visit: Payer: Self-pay | Admitting: Internal Medicine

## 2019-03-25 DIAGNOSIS — I10 Essential (primary) hypertension: Secondary | ICD-10-CM

## 2019-03-27 ENCOUNTER — Other Ambulatory Visit: Payer: Self-pay | Admitting: Internal Medicine

## 2019-04-09 ENCOUNTER — Encounter: Payer: Self-pay | Admitting: Internal Medicine

## 2019-04-29 ENCOUNTER — Ambulatory Visit: Payer: Medicare Other

## 2019-05-05 ENCOUNTER — Ambulatory Visit: Payer: Medicare Other | Admitting: Internal Medicine
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
## 2019-05-16 ENCOUNTER — Encounter: Payer: Self-pay | Admitting: Nurse Practitioner

## 2019-05-16 ENCOUNTER — Ambulatory Visit (INDEPENDENT_AMBULATORY_CARE_PROVIDER_SITE_OTHER): Payer: Medicare Other | Admitting: Nurse Practitioner

## 2019-05-16 ENCOUNTER — Other Ambulatory Visit: Payer: Self-pay

## 2019-05-16 VITALS — BP 182/76 | HR 61 | Ht 65.5 in | Wt 139.8 lb

## 2019-05-16 DIAGNOSIS — J449 Chronic obstructive pulmonary disease, unspecified: Secondary | ICD-10-CM

## 2019-05-16 DIAGNOSIS — I5032 Chronic diastolic (congestive) heart failure: Secondary | ICD-10-CM

## 2019-05-16 DIAGNOSIS — Z720 Tobacco use: Secondary | ICD-10-CM | POA: Diagnosis not present

## 2019-05-16 DIAGNOSIS — I2510 Atherosclerotic heart disease of native coronary artery without angina pectoris: Secondary | ICD-10-CM

## 2019-05-16 DIAGNOSIS — Z7189 Other specified counseling: Secondary | ICD-10-CM

## 2019-05-16 DIAGNOSIS — I10 Essential (primary) hypertension: Secondary | ICD-10-CM

## 2019-05-16 DIAGNOSIS — E7849 Other hyperlipidemia: Secondary | ICD-10-CM

## 2019-05-16 MED ORDER — NITROGLYCERIN 0.4 MG SL SUBL: SUBLINGUAL_TABLET | SUBLINGUAL | 0 refills | Status: DC

## 2019-05-16 MED ORDER — CLONIDINE HCL 0.1 MG PO TABS: 0.1000 mg | ORAL_TABLET | Freq: Every day | ORAL | 11 refills | Status: DC

## 2019-05-16 NOTE — Patient Instructions (Addendum)
After Visit Summary:  We will be checking the following labs today - BMET, CBC, HPF, Lipids   Medication Instructions:    Continue with your current medicines. BUT I have refilled the NTG today to your pharmacy - Use your NTG under your tongue for recurrent chest pain. May take one tablet every 5 minutes. If you are still having discomfort after 3 tablets in 15 minutes, call 911. I am adding Clonidine 0.1 mg to take each night  - this is at your pharmacy   If you need a refill on your cardiac medications before your next appointment, please call your pharmacy.     Testing/Procedures To Be Arranged:  N/A  Follow-Up:   See Dr. Tamala Julian in about 2 months for recheck    At [facility], you and your health needs are our priority.  As part of our continuing mission to provide you with exceptional heart care, we have created designated Provider Care Teams.  These Care Teams include your primary Cardiologist (physician) and Advanced Practice Providers (APPs -  Physician Assistants and Nurse Practitioners) who all work together to provide you with the care you need, when you need it.  Special Instructions:  . Stay safe, stay home, wash your hands for at least 20 seconds and wear a mask when out in public.  . It was good to talk with you today.  . Show this paper to your daughter so she will know about your medicines   Call the [place] office at [phone] if you have any questions, problems or concerns.

## 2019-05-17 LAB — BASIC METABOLIC PANEL
BUN/Creatinine Ratio: 13 (ref 10–24)
BUN: 26 mg/dL (ref 8–27)
CO2: 25 mmol/L (ref 20–29)
Calcium: 11.1 mg/dL — ABNORMAL HIGH (ref 8.6–10.2)
Chloride: 100 mmol/L (ref 96–106)
Creatinine, Ser: 1.97 mg/dL — ABNORMAL HIGH (ref 0.76–1.27)
GFR calc Af Amer: 35 mL/min/{1.73_m2} — ABNORMAL LOW (ref >59)
GFR calc non Af Amer: 30 mL/min/{1.73_m2} — ABNORMAL LOW (ref >59)
Glucose: 77 mg/dL (ref 65–99)
Potassium: 4.6 mmol/L (ref 3.5–5.2)
Sodium: 138 mmol/L (ref 134–144)

## 2019-05-17 LAB — CBC
Hematocrit: 40.5 % (ref 37.5–51.0)
Hemoglobin: 13 g/dL (ref 13.0–17.7)
MCH: 28.6 pg (ref 26.6–33.0)
MCHC: 32.1 g/dL (ref 31.5–35.7)
MCV: 89 fL (ref 79–97)
Platelets: 152 10*3/uL (ref 150–450)
RBC: 4.55 x10E6/uL (ref 4.14–5.80)
RDW: 13.8 % (ref 11.6–15.4)
WBC: 5.7 10*3/uL (ref 3.4–10.8)

## 2019-05-17 LAB — HEPATIC FUNCTION PANEL
ALT: 4 IU/L (ref 0–44)
AST: 14 IU/L (ref 0–40)
Albumin: 3.7 g/dL (ref 3.6–4.6)
Alkaline Phosphatase: 99 IU/L (ref 39–117)
Bilirubin Total: 0.5 mg/dL (ref 0.0–1.2)
Bilirubin, Direct: 0.1 mg/dL (ref 0.00–0.40)
Total Protein: 8.2 g/dL (ref 6.0–8.5)

## 2019-05-17 LAB — LIPID PANEL
Chol/HDL Ratio: 3.2 ratio (ref 0.0–5.0)
Cholesterol, Total: 167 mg/dL (ref 100–199)
HDL: 53 mg/dL (ref >39)
LDL Chol Calc (NIH): 100 mg/dL — ABNORMAL HIGH (ref 0–99)
Triglycerides: 74 mg/dL (ref 0–149)
VLDL Cholesterol Cal: 14 mg/dL (ref 5–40)

## 2019-05-23 ENCOUNTER — Ambulatory Visit: Payer: Medicare Other | Admitting: Internal Medicine

## 2019-05-28 ENCOUNTER — Other Ambulatory Visit: Payer: Self-pay | Admitting: Nurse Practitioner

## 2019-05-28 DIAGNOSIS — I2510 Atherosclerotic heart disease of native coronary artery without angina pectoris: Secondary | ICD-10-CM

## 2019-05-28 DIAGNOSIS — I10 Essential (primary) hypertension: Secondary | ICD-10-CM

## 2019-05-29 ENCOUNTER — Telehealth: Payer: Self-pay | Admitting: Internal Medicine

## 2019-05-29 ENCOUNTER — Other Ambulatory Visit: Payer: Self-pay

## 2019-05-29 DIAGNOSIS — I2510 Atherosclerotic heart disease of native coronary artery without angina pectoris: Secondary | ICD-10-CM

## 2019-05-29 DIAGNOSIS — I10 Essential (primary) hypertension: Secondary | ICD-10-CM

## 2019-05-29 MED ORDER — NITROGLYCERIN 0.4 MG SL SUBL: SUBLINGUAL_TABLET | SUBLINGUAL | 0 refills | Status: DC

## 2019-05-29 NOTE — Telephone Encounter (Signed)
Please notify Karena Addison Rx has been sent to [facility]

## 2019-05-29 NOTE — Telephone Encounter (Signed)
Spoke with patient's daughter [name] and stated will inform Mr. Burkholder and will pick up medication.

## 2019-05-29 NOTE — Telephone Encounter (Signed)
Dee called stating [name] lost his heart medication, the one that goes under his tongue and was told by the pharmacist needed a new prescription sent by his PCP to be able to get a new one.  Asked for the name of the medication and any of them remember.

## 2019-06-16 ENCOUNTER — Other Ambulatory Visit (INDEPENDENT_AMBULATORY_CARE_PROVIDER_SITE_OTHER): Payer: Medicare Other

## 2019-06-18 LAB — PARATHYROID HORMONE, INTACT (NO CA): PTH: 79 pg/mL — ABNORMAL HIGH (ref 15–65)

## 2019-06-18 LAB — CALCIUM, IONIZED: Calcium, Ion: 5.1 mg/dL (ref 4.5–5.6)

## 2019-06-20 ENCOUNTER — Encounter: Payer: Self-pay | Admitting: Internal Medicine

## 2019-06-20 ENCOUNTER — Ambulatory Visit (INDEPENDENT_AMBULATORY_CARE_PROVIDER_SITE_OTHER): Payer: Medicare Other | Admitting: Internal Medicine

## 2019-06-20 ENCOUNTER — Other Ambulatory Visit: Payer: Self-pay

## 2019-06-20 VITALS — BP 130/80 | HR 68 | Resp 12 | Ht 65.5 in | Wt 134.0 lb

## 2019-06-20 DIAGNOSIS — N200 Calculus of kidney: Secondary | ICD-10-CM

## 2019-06-20 DIAGNOSIS — I2510 Atherosclerotic heart disease of native coronary artery without angina pectoris: Secondary | ICD-10-CM

## 2019-06-20 DIAGNOSIS — G119 Hereditary ataxia, unspecified: Secondary | ICD-10-CM

## 2019-06-20 DIAGNOSIS — E213 Hyperparathyroidism, unspecified: Secondary | ICD-10-CM | POA: Insufficient documentation

## 2019-06-20 DIAGNOSIS — J449 Chronic obstructive pulmonary disease, unspecified: Secondary | ICD-10-CM | POA: Diagnosis not present

## 2019-06-20 DIAGNOSIS — I10 Essential (primary) hypertension: Secondary | ICD-10-CM | POA: Diagnosis not present

## 2019-06-20 DIAGNOSIS — Z720 Tobacco use: Secondary | ICD-10-CM

## 2019-06-20 DIAGNOSIS — Z716 Tobacco abuse counseling: Secondary | ICD-10-CM

## 2019-06-20 DIAGNOSIS — N1832 Chronic kidney disease, stage 3b: Secondary | ICD-10-CM

## 2019-06-20 DIAGNOSIS — I5032 Chronic diastolic (congestive) heart failure: Secondary | ICD-10-CM

## 2019-06-20 HISTORY — DX: Hyperparathyroidism, unspecified: E21.3

## 2019-06-20 MED ORDER — ISOSORBIDE MONONITRATE ER 120 MG PO TB24: 120.0000 mg | ORAL_TABLET | Freq: Every day | ORAL | 11 refills | Status: DC

## 2019-06-20 MED ORDER — ATORVASTATIN CALCIUM 40 MG PO TABS: ORAL_TABLET | ORAL | 11 refills | Status: DC

## 2019-06-20 MED ORDER — ALBUTEROL SULFATE HFA 108 (90 BASE) MCG/ACT IN AERS: 2.0000 | INHALATION_SPRAY | Freq: Four times a day (QID) | RESPIRATORY_TRACT | 1 refills | Status: DC | PRN

## 2019-06-20 NOTE — Patient Instructions (Signed)
Drink 6 glasses of water daily.

## 2019-06-20 NOTE — Progress Notes (Signed)
Subjective:    Patient ID: [name], male   DOB: 03/14/1930, 86 y.o.   MRN: [id number]   HPI   Noted patient had not been seen in some time, which did not realize as see him regularly with his wife for visits and with home visits for his wife.  They have been dealing with roach infestation in home, which now sounds controlled and are storing food and throwing away garbage regularly now.  Has used some unknown treatment for the home.  Sounds like home visiting nurse is now back to seeing his wife, [name] (Patty), due to clean up.  [name] has a home health aid, who cooks meals and cleans.  Have not been able to get her to help with medication management with pick up and pillbox filling on a consistent basis. [name] still receiving home nursing for wound care of legs and PT.  He did not bring in his meds today and cannot say what he is taking.  Historically, has difficulty with compliance despite multiple home visits to help he and his daughter sort out his meds for pillbox and continue to refill.  Continues to smoke and has been drinking alcohol at times with home visits.    [name], his biologic daughter, who lives in [place], will sometimes fills his pillbox.  She sometimes will write out directions  [name], his biologic granddaughter, is not involved with his medication management.  Taking 4 pills in the morning, 5 pills after dinner (does not correlate exactly with meds he should be taking--missing one tab at least) and 1 tab at bedtime (Clonidine).  1.  Hypertension:  [name]/[name] ( [name]'s daughter) calls meds in and patient drives over to pick up meds.  This has not been consistent at all in past.  2.  COPD:  Continues to smoke 5 cigarettes daily.  Not using the nicotine lozenges as written for in past.  Not using inhalers much at all.  Discussed again that his Symbicort is to be used twice daily and Albuterol only to be used as needed.    3.  History of CAD:  Used SL  nitroglycerin last week with resolution of chest pain after just a couple of minutes.  States he was not active at the time--just sitting.  Not using Imdur--able to obtain his fill history and last refill in March shows he did not obtain.  Fill history only from end of last month as pharmacy busy today.  4.  Chronic hypercalcemia:  Noted since 01/2018.  Recently able to get him in for repeat PTH and ionized calcium.  PTH high at 79 and ionized calcium normal at 5.1.  Albumin 3.7 He has a history dating to when he was about 86 years old of passing a kidney stone, but none since.   He does not complain of bone aching.  No muscle spasm  Last calcium level is fairly stable at 11.1 since 01/2018 when high calcium first noted.  He does have CKD with Creatinine running 1.88 to 2.04 over the past 4 years.  Likely due to poorly controlled BP over time. UA without blood--has had calcium oxalate crystals in urine.   Does drink milk--3-4 cups daily. Drinks Pepsi 3-4 daily. Not much in way of water.  He would be willing to drink more water.  Not a good candidate for surgery. He should be taking Furosemide daily as well.   5.  Dyslipidemia:  His last LDL was 100, which is above goal of  71 with risk factory/hx of CAD.  Does not appear he is obtaining Atorvastatin and last FLP 05/16/2019 with results as above.        Current Meds  Medication Sig  . aspirin 81 MG tablet Take 81 mg by mouth daily.  . cloNIDine (CATAPRES) 0.1 MG tablet Take 1 tablet (0.1 mg total) by mouth at bedtime.  . furosemide (LASIX) 40 MG tablet TAKE 1 TABLET BY MOUTH EVERY DAY  . hydrALAZINE (APRESOLINE) 100 MG tablet TAKE 1 TABLET(100 MG) BY MOUTH TWICE DAILY  . isosorbide mononitrate (IMDUR) 120 MG 24 hr tablet Take 1 tablet (120 mg total) by mouth daily.  Marland Kitchen losartan (COZAAR) 100 MG tablet TAKE 1 TABLET(100 MG) BY MOUTH EVERY MORNING  . metoprolol tartrate (LOPRESSOR) 25 MG tablet TAKE 1 TABLET(25 MG) BY MOUTH TWICE DAILY  .  nitroGLYCERIN (NITROSTAT) 0.4 MG SL tablet PLACE 1 TABLET UNDER THE TONGUE EVERY 5 MINUTES AS NEEDED FOR CHEST PAIN, SEEK MEDICAL ATTENTION IF NO RELIEF     No Known Allergies   Review of Systems    Objective:   BP 130/80 (BP Location: Left Arm, Patient Position: Sitting, Cuff Size: Normal)   Pulse 68   Resp 12   Ht 5' 5.5" (1.664 m)   Wt 134 lb (60.8 kg)   BMI 21.96 kg/m   Physical Exam  Ataxic gait, but fairly stable with walker NAD HEENT:  PERRL, EOMI, Arcus senilis bilaterally Neck: Supple, No adenopathy, no thyromegaly Chest:  CTA CV:  RRR with normal S1 and S2, No S3, S4 or murmur appreciated.  No carotid bruit appreciated.  Radial pulses normal and equal.   LE:  No edema.  Decreased DP pulses bilaterally Abd:  S, NT, No HSM or mass, + BS.  Midline old surgical scar. Neuro:  Gait as above.  Right hand and arm with chronic atrophy of muscles--more so thenar eminence area of right hand.      Assessment & Plan   Spent 1 hour face to face  1.  Hypertension:  Surprisingly good control today, despite missing what appears to be his Imdur.  Wrote out what meds to take at what time on his medication sheet.  AM:  Metoprolol, Hydralazine, Furosemide, ASA, Losartan, Imdur PM:  Metoprolol, Hydralazine, Amlodipine, Atorvastatin Bedtime:  Clonidine  He states his daughter will use this to fill his pill box.  2.  Dyslipidemia:  Atorvastatin not being taken--refilled.  Repeat FLP in 2 months.  3.  COPD:  Has not used Symbicort in likely years.  Removed from list.  Albuterol available for as needed use.  4.  Tobacco Abuse:  Has not tried any of the nicotine patches, gum or lozenges as recommended in past.  Encouraged him to quit smoking.  Has not seem motivated.  5.  Hypercalcemia with normal ionized calcium and somewhat high PTH and CKD.  Asymptomatic.  Relatively stable over past 2 years.  Vitamin D level with next labs. Not a good surgical candidate.  6.  History of  stroke and cerebellar ataxia:  Gait much more stable with wheeled walker.
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
## 2019-07-18 ENCOUNTER — Other Ambulatory Visit: Payer: Self-pay

## 2019-07-18 ENCOUNTER — Encounter: Payer: Self-pay | Admitting: Interventional Cardiology

## 2019-07-18 ENCOUNTER — Ambulatory Visit (INDEPENDENT_AMBULATORY_CARE_PROVIDER_SITE_OTHER): Payer: Medicare Other | Admitting: Interventional Cardiology

## 2019-07-18 VITALS — BP 152/66 | HR 53 | Ht 65.5 in | Wt 136.0 lb

## 2019-07-18 DIAGNOSIS — N1832 Chronic kidney disease, stage 3b: Secondary | ICD-10-CM

## 2019-07-18 DIAGNOSIS — I5032 Chronic diastolic (congestive) heart failure: Secondary | ICD-10-CM | POA: Diagnosis not present

## 2019-07-18 DIAGNOSIS — Z720 Tobacco use: Secondary | ICD-10-CM | POA: Diagnosis not present

## 2019-07-18 DIAGNOSIS — Z7189 Other specified counseling: Secondary | ICD-10-CM

## 2019-07-18 DIAGNOSIS — I10 Essential (primary) hypertension: Secondary | ICD-10-CM | POA: Diagnosis not present

## 2019-07-18 DIAGNOSIS — I2510 Atherosclerotic heart disease of native coronary artery without angina pectoris: Secondary | ICD-10-CM

## 2019-07-18 DIAGNOSIS — J449 Chronic obstructive pulmonary disease, unspecified: Secondary | ICD-10-CM

## 2019-07-18 DIAGNOSIS — E7849 Other hyperlipidemia: Secondary | ICD-10-CM

## 2019-07-18 NOTE — Patient Instructions (Signed)

## 2019-07-24 ENCOUNTER — Other Ambulatory Visit: Payer: Self-pay

## 2019-07-24 MED ORDER — ATORVASTATIN CALCIUM 40 MG PO TABS: ORAL_TABLET | ORAL | 3 refills | Status: DC

## 2019-07-24 MED ORDER — LOSARTAN POTASSIUM 100 MG PO TABS: ORAL_TABLET | ORAL | 3 refills | Status: DC

## 2019-07-26 ENCOUNTER — Telehealth: Payer: Self-pay | Admitting: Interventional Cardiology

## 2019-07-26 NOTE — Telephone Encounter (Signed)
[name] called to speak to a MA working with Truitt Merle. The [facility] Pharmacist  wanted to make sure the patients medications are up to date. There has been some miscommunication between the PCP and Cardiology and the [facility] Pharmacist wanted to get the medications straightened out.  If [name] is not available the office is able to leave a voicemail. The number provided is her direct line with a secure voicemail

## 2019-07-26 NOTE — Telephone Encounter (Signed)
Spoke with Larene Beach, [employer] who was calling in to confirm the patients current medications. All medications were reviewed.

## 2019-08-16 ENCOUNTER — Encounter: Payer: Self-pay | Admitting: Internal Medicine

## 2019-08-16 ENCOUNTER — Ambulatory Visit (INDEPENDENT_AMBULATORY_CARE_PROVIDER_SITE_OTHER): Payer: Medicare Other | Admitting: Internal Medicine

## 2019-08-16 VITALS — BP 170/80 | HR 76 | Resp 12 | Ht 65.5 in | Wt 138.0 lb

## 2019-08-16 DIAGNOSIS — I10 Essential (primary) hypertension: Secondary | ICD-10-CM

## 2019-08-16 DIAGNOSIS — I2510 Atherosclerotic heart disease of native coronary artery without angina pectoris: Secondary | ICD-10-CM | POA: Diagnosis not present

## 2019-08-16 NOTE — Patient Instructions (Addendum)
Tuesday morning:  Only morning he does not have Isorsorbide--bottle is empty.   I filled every pill box section through Monday of next week otherwise.  So Needs refill now or soon:  Isosorbide Mononitrate Atorvastatin--only one pill left in bottle   Clearnce put above two pill bottles in pocket  STOP CLONIDINE

## 2019-08-16 NOTE — Progress Notes (Signed)
    Subjective:    Patient ID: [name], male   DOB: [birth year], 86 y.o.   MRN: [id number]   HPI   Medication management visit:  [name] to Cardiologist 1 month ago per patient and bp was too high.  Was placed on another medication that he states makes him feel drunk and he does not want to take.  Looking at Dr. Thompson Caul note from 07/18/19, however, he did not add another medication. In note from 05/16/19, however, Truitt Merle added Clonidine.  He was taking the Clonidine with his visit with Dr. Tamala Julian. States he stopped taking all of his meds 2 weeks ago because he felt drunk.    He has no Clonidine left in pill bottle dated March 9th and last time he filled any medication was 06/27/19.  All for 30 days.  He has 3 bottles of hydralazine in his box.  Dated back to February with one of them and completely full.  All three with lots of pills left  Current Meds  Medication Sig  . albuterol (PROAIR HFA) 108 (90 Base) MCG/ACT inhaler Inhale 2 puffs into the lungs every 6 (six) hours as needed for wheezing or shortness of breath.  Marland Kitchen amLODipine (NORVASC) 10 MG tablet TAKE 1 TABLET(10 MG) BY MOUTH EVERY EVENING  . aspirin 81 MG tablet Take 81 mg by mouth daily.  Marland Kitchen atorvastatin (LIPITOR) 40 MG tablet 1 tab by mouth with evening meal  . furosemide (LASIX) 40 MG tablet TAKE 1 TABLET BY MOUTH EVERY DAY  . hydrALAZINE (APRESOLINE) 100 MG tablet TAKE 1 TABLET(100 MG) BY MOUTH TWICE DAILY  . isosorbide mononitrate (IMDUR) 120 MG 24 hr tablet Take 1 tablet (120 mg total) by mouth daily.  Marland Kitchen losartan (COZAAR) 100 MG tablet TAKE 1 TABLET(100 MG) BY MOUTH EVERY MORNING  . metoprolol tartrate (LOPRESSOR) 25 MG tablet TAKE 1 TABLET(25 MG) BY MOUTH TWICE DAILY  . nitroGLYCERIN (NITROSTAT) 0.4 MG SL tablet PLACE 1 TABLET UNDER THE TONGUE EVERY 5 MINUTES AS NEEDED FOR CHEST PAIN, SEEK MEDICAL ATTENTION IF NO RELIEF   No Known Allergies   Review of Systems    Objective:   BP (!) 170/80 (BP Location: Left Arm,  Patient Position: Sitting, Cuff Size: Normal)   Pulse 76   Resp 12   Ht 5' 5.5" (1.664 m)   Wt 138 lb (62.6 kg)   BMI 22.62 kg/m   Physical Exam NAD Lungs:  CTA CV:  RRR without murmur or rub. LE:  No edema  Assessment & Plan  1.  Medication management, particularly for hypertension and CAD:  His problem is not taking his meds.   Filled up pill box and wrote out which need refills now.   Discussed with him that he has missed a month of meds or significantly more regarding his hydralazine.  Stopping Clonidine Will see if [name], CHW can check in weekly and go over meds with them.  Make sure pillboxes are filled appropriately

## 2019-08-20 NOTE — Progress Notes (Signed)
Here for COVID vaccination with Moderna, but unable to enter into system--will place in system historically

## 2019-08-21 ENCOUNTER — Ambulatory Visit (INDEPENDENT_AMBULATORY_CARE_PROVIDER_SITE_OTHER): Payer: Medicare Other | Admitting: Internal Medicine

## 2019-08-21 ENCOUNTER — Other Ambulatory Visit: Payer: Self-pay

## 2019-08-21 DIAGNOSIS — H6123 Impacted cerumen, bilateral: Secondary | ICD-10-CM

## 2019-08-21 NOTE — Progress Notes (Signed)
    Subjective:    Patient ID: [name], male   DOB: 1930-07-14, 86 y.o.   MRN: [id number]   HPI   Here specifically to have ears evaluated for cerumen impaction as was having difficulties with hearing last week when in for medication check.   Has not been able to hear well out of right ear for about 1.5 weeks.  No outpatient medications have been marked as taking for the 08/21/19 encounter (Appointment) with Mack Hook, MD.   No Known Allergies   Review of Systems    Objective:   There were no vitals taken for this visit.  Physical Exam   Bilateral cerumen impaction, right greater than left.    With irrigation and curettage, cerumen dislodged and removed from bilateral ear canals with normal TMs and canal behind the impaction.  Patient with baseline hearing at completion.   Assessment & Plan   Hearing loss due to cerumen impaction bilaterally:  Resolved with irrigation and curettage of cerumen bilaterally.

## 2019-08-24 ENCOUNTER — Other Ambulatory Visit (INDEPENDENT_AMBULATORY_CARE_PROVIDER_SITE_OTHER): Payer: Medicare Other

## 2019-08-24 DIAGNOSIS — E559 Vitamin D deficiency, unspecified: Secondary | ICD-10-CM

## 2019-08-24 DIAGNOSIS — Z79899 Other long term (current) drug therapy: Secondary | ICD-10-CM

## 2019-08-24 DIAGNOSIS — E785 Hyperlipidemia, unspecified: Secondary | ICD-10-CM

## 2019-08-29 ENCOUNTER — Other Ambulatory Visit: Payer: Self-pay

## 2019-08-29 ENCOUNTER — Encounter: Payer: Self-pay | Admitting: Internal Medicine

## 2019-08-29 ENCOUNTER — Ambulatory Visit (INDEPENDENT_AMBULATORY_CARE_PROVIDER_SITE_OTHER): Payer: Medicare Other | Admitting: Internal Medicine

## 2019-08-29 VITALS — BP 160/70 | HR 74 | Resp 12 | Ht 65.5 in | Wt 133.0 lb

## 2019-08-29 DIAGNOSIS — Z23 Encounter for immunization: Secondary | ICD-10-CM | POA: Diagnosis not present

## 2019-08-29 DIAGNOSIS — E213 Hyperparathyroidism, unspecified: Secondary | ICD-10-CM

## 2019-08-29 DIAGNOSIS — Z716 Tobacco abuse counseling: Secondary | ICD-10-CM

## 2019-08-29 DIAGNOSIS — N1832 Chronic kidney disease, stage 3b: Secondary | ICD-10-CM | POA: Insufficient documentation

## 2019-08-29 DIAGNOSIS — Z720 Tobacco use: Secondary | ICD-10-CM

## 2019-08-29 DIAGNOSIS — I2510 Atherosclerotic heart disease of native coronary artery without angina pectoris: Secondary | ICD-10-CM | POA: Diagnosis not present

## 2019-08-29 DIAGNOSIS — I10 Essential (primary) hypertension: Secondary | ICD-10-CM

## 2019-08-29 NOTE — Progress Notes (Signed)
Subjective:    Patient ID: [name], male   DOB: 09/25/1930, 86 y.o.   MRN: [id number]   HPI   Here for follow up after labs. Did not bring pillbox to make sure he and family are following through with meds after redoing set up last visit.  He states he went to the pharmacy after that visit--not clear how much later, and they did not have any medication of him.  Not clear if he called the refills in or not. We called the pharmacy and he last filled Atorvastatin for 90 days and Isosorbide for 30 days on 07/30/2019, but he did not have those bottles with him on his June 9th visit--everything was from April.  Those particular medications were out as well.  1.  Hypertension:  Still not at goal, but do not think he is taking meds appropriately still.    2.  Hyperparathyroidism:  Stable calcium with normal ionized calcium and mildly elevated PTH.  He was to have Vitamin D levels with these labs, but not resulted for some reason. Not clear he has increased his water intake as he said he would at visit in April when last discussed.  See Problem list for overview of this issue.   3.  Chronic renal insufficiency:  Relatively stable at 1.88.    4.  Hyperlipidemia in patient with CAD:  Almost at goal and improved from March, so must be taking Atorvastatin more frequently.   Lipid Panel     Component Value Date/Time   CHOL 153 08/24/2019 0854   TRIG 48 08/24/2019 0854   HDL 56 08/24/2019 0854   CHOLHDL 3.2 05/16/2019 1243   CHOLHDL 2.6 04/27/2016 0527   VLDL 7 04/27/2016 0527   LDLCALC 87 08/24/2019 0854   LABVLDL 10 08/24/2019 0854   5.  CAD:  Had some chest discomfort last week and called EMS.  Took NTG with some relief, but really improved after he was given TUMS as suggested by EMS.  He did not go to the hospital.  His ECG was fine.  He continues to smoke.  States he is not drinking alcohol, but we have been over to his home when he has been drinking in past year or so.    Current  Meds  Medication Sig  . amLODipine (NORVASC) 10 MG tablet TAKE 1 TABLET(10 MG) BY MOUTH EVERY EVENING  . aspirin 81 MG tablet Take 81 mg by mouth daily.  Marland Kitchen atorvastatin (LIPITOR) 40 MG tablet 1 tab by mouth with evening meal  . furosemide (LASIX) 40 MG tablet TAKE 1 TABLET BY MOUTH EVERY DAY  . hydrALAZINE (APRESOLINE) 100 MG tablet TAKE 1 TABLET(100 MG) BY MOUTH TWICE DAILY  . isosorbide mononitrate (IMDUR) 120 MG 24 hr tablet Take 1 tablet (120 mg total) by mouth daily.  Marland Kitchen losartan (COZAAR) 100 MG tablet TAKE 1 TABLET(100 MG) BY MOUTH EVERY MORNING  . metoprolol tartrate (LOPRESSOR) 25 MG tablet TAKE 1 TABLET(25 MG) BY MOUTH TWICE DAILY  . nitroGLYCERIN (NITROSTAT) 0.4 MG SL tablet PLACE 1 TABLET UNDER THE TONGUE EVERY 5 MINUTES AS NEEDED FOR CHEST PAIN, SEEK MEDICAL ATTENTION IF NO RELIEF   No Known Allergies   Review of Systems    Objective:   BP (!) 160/70 (BP Location: Left Arm, Patient Position: Sitting, Cuff Size: Normal)   Pulse 74   Resp 12   Ht 5' 5.5" (1.664 m)   Wt 133 lb (60.3 kg)   BMI 21.80 kg/m  Physical Exam  NAD HEENT:  PERRL, EOMI Neck: Supple, No adenopathy Chest:  CTA CV:  RRR without murmur or rub.  Radial and DP pulses normal and equal.  No edema of legs.    Assessment & Plan   1.  Hypertension:  Inadequate control, still likely due to not taking meds appropriately.  Not clear if patient did not bring his most up to date refills at visit on 08/16/2019 as the newest bottles were from April and he has apparently filled some end of May.  2.  Problems with taking medications regularly:  [name], CHW and I will meet with patient and his wife, [name] next Monday to go over medication list and make sure pill box set up properly.  [name] will drop in on Mondays regularly to recheck weekly that they are filling pillboxes and picking up meds properly. We have tried multiple ways to set this up, but after 4 + years, still having problems with follow  through with family and patients.  3.  Hyperparathyroidism with normal ionized calcium and mildly elevated PTH.  Not symptomatic with kidney stones in recent years and not a good candidate for surgery.  Await Vitamin D levels, however. Encouraged him to drink less soda and more water.  4.  CAD:  Stable, though likely missing isosorbide regularly and also bp not controlled.  5.  Hyperlipidemia:  Improved use of Atorvastatin, but LDL still a bit above goal.  Medication management as above.  6.  CKD:  Stable.  Needs better bp control.  7.   Tobacco abuse:  Encouraged him to stop completely.  He has not tried nicotine gum or lozenges as suggested previously  8.  HM:  Td today.    4.

## 2019-09-03 LAB — COMPREHENSIVE METABOLIC PANEL
ALT: 5 IU/L (ref 0–44)
AST: 17 IU/L (ref 0–40)
Albumin/Globulin Ratio: 0.9 — ABNORMAL LOW (ref 1.2–2.2)
Albumin: 3.9 g/dL (ref 3.6–4.6)
Alkaline Phosphatase: 102 IU/L (ref 48–121)
BUN/Creatinine Ratio: 14 (ref 10–24)
BUN: 26 mg/dL (ref 8–27)
Bilirubin Total: 0.5 mg/dL (ref 0.0–1.2)
CO2: 23 mmol/L (ref 20–29)
Calcium: 11.3 mg/dL — ABNORMAL HIGH (ref 8.6–10.2)
Chloride: 104 mmol/L (ref 96–106)
Creatinine, Ser: 1.88 mg/dL — ABNORMAL HIGH (ref 0.76–1.27)
GFR calc Af Amer: 37 mL/min/{1.73_m2} — ABNORMAL LOW (ref >59)
GFR calc non Af Amer: 32 mL/min/{1.73_m2} — ABNORMAL LOW (ref >59)
Globulin, Total: 4.3 g/dL (ref 1.5–4.5)
Glucose: 85 mg/dL (ref 65–99)
Potassium: 4.9 mmol/L (ref 3.5–5.2)
Sodium: 140 mmol/L (ref 134–144)
Total Protein: 8.2 g/dL (ref 6.0–8.5)

## 2019-09-03 LAB — VITAMIN D 1,25 DIHYDROXY
Vitamin D 1, 25 (OH)2 Total: 39 pg/mL
Vitamin D2 1, 25 (OH)2: <10 pg/mL
Vitamin D3 1, 25 (OH)2: 39 pg/mL

## 2019-09-03 LAB — LIPID PANEL W/O CHOL/HDL RATIO
Cholesterol, Total: 153 mg/dL (ref 100–199)
HDL: 56 mg/dL (ref >39)
LDL Chol Calc (NIH): 87 mg/dL (ref 0–99)
Triglycerides: 48 mg/dL (ref 0–149)
VLDL Cholesterol Cal: 10 mg/dL (ref 5–40)

## 2019-09-04 ENCOUNTER — Other Ambulatory Visit (INDEPENDENT_AMBULATORY_CARE_PROVIDER_SITE_OTHER): Payer: Medicare Other | Admitting: Internal Medicine

## 2019-09-04 ENCOUNTER — Other Ambulatory Visit: Payer: Self-pay

## 2019-09-04 DIAGNOSIS — Z9119 Patient's noncompliance with other medical treatment and regimen: Secondary | ICD-10-CM

## 2019-09-04 DIAGNOSIS — Z91199 Patient's noncompliance with other medical treatment and regimen due to unspecified reason: Secondary | ICD-10-CM

## 2019-09-04 NOTE — Progress Notes (Signed)
Home visit with [name], CHW Health aid, [name] [name]'s daughter, [name]  Since his last visit, a bag of newly filled medication dated in late May was found.  He has clonidine refilled, which was discontinued   His meds were divided into AM and PM on his medication list.  Especially emphasized he seems to be missing Hydralazine most of the time.  He has 6 pills with his morning meal and 4 with his evening meals. Filled the pill box  [name] will visit with him each Friday morning and fill his pill box and report back to me.  [name] will check on his pillbox first thing in the morning and document in a college ruled spiral notebook when he misses and takes his meds.   [name] will call in his meds for refill as needed. [name] will be a second set of eyes for all of this.  We did the same with his wife's, [name]'s pill box.  Spent 1 hour going over meds with everyone.

## 2019-11-29 ENCOUNTER — Ambulatory Visit (INDEPENDENT_AMBULATORY_CARE_PROVIDER_SITE_OTHER): Payer: Medicare Other | Admitting: Internal Medicine

## 2019-11-29 ENCOUNTER — Encounter: Payer: Self-pay | Admitting: Internal Medicine

## 2019-11-29 VITALS — BP 120/72 | HR 72 | Resp 12 | Ht 65.5 in | Wt 132.0 lb

## 2019-11-29 DIAGNOSIS — R05 Cough: Secondary | ICD-10-CM | POA: Diagnosis not present

## 2019-11-29 DIAGNOSIS — H9193 Unspecified hearing loss, bilateral: Secondary | ICD-10-CM

## 2019-11-29 DIAGNOSIS — I10 Essential (primary) hypertension: Secondary | ICD-10-CM | POA: Diagnosis not present

## 2019-11-29 DIAGNOSIS — K4090 Unilateral inguinal hernia, without obstruction or gangrene, not specified as recurrent: Secondary | ICD-10-CM | POA: Diagnosis not present

## 2019-11-29 DIAGNOSIS — Z716 Tobacco abuse counseling: Secondary | ICD-10-CM

## 2019-11-29 DIAGNOSIS — R058 Other specified cough: Secondary | ICD-10-CM

## 2019-11-29 DIAGNOSIS — J449 Chronic obstructive pulmonary disease, unspecified: Secondary | ICD-10-CM

## 2019-11-29 DIAGNOSIS — Z720 Tobacco use: Secondary | ICD-10-CM

## 2019-11-29 MED ORDER — AZITHROMYCIN 250 MG PO TABS: ORAL_TABLET | ORAL | 0 refills | Status: DC

## 2019-11-29 NOTE — Patient Instructions (Addendum)
[facility] [place], [place], [place]  [id number]  [place]  These are the [facility] [employer] students trying to get hold of you.  You need to talk with them on a regular basis.

## 2019-11-29 NOTE — Progress Notes (Signed)
Subjective:    Patient ID: [name], male   DOB: [birth year], 86 y.o.   MRN: [id number]   HPI   Patient willing to consider prepackaged medications and would be interested in delivery as well.  States he is finally ready.    1.  Hypertension:  Looks like he has been finally taking his medication regularly, but apparently home health aide has disappeared and his daughter is needing to come from [place] to fill his pillbox.  Lots of poor interaction with everyone involved in their care.  2.  Lump in right groin:  For the past 3 -4 months.  Hurts.  Goes away when lies down on his back.  If stands, bulges more.  BMs fine.  Good appetite.  No nausea or vomiting.  3.  [employer] nursing students have been trying to contact Mr. And Mrs.  Kinkead, but not picking up the phone or hanging up.  He states he cannot hear what they are saying.  Thought they were trying to change his medical coverage.    4.  Coughing up phlegm for about 1 month.  States the mucous sometimes looks pink, but not like blood.  Has difficulty describing.  No dyspnea.  No chest pain.  Continues to smoke 1/3 PPD.  Has not always had a productive cough.  Never uses Albuterol HFA.  Cannot explain why.   Current Meds  Medication Sig  . amLODipine (NORVASC) 10 MG tablet TAKE 1 TABLET(10 MG) BY MOUTH EVERY EVENING  . aspirin 81 MG tablet Take 81 mg by mouth daily.  Marland Kitchen atorvastatin (LIPITOR) 40 MG tablet 1 tab by mouth with evening meal  . furosemide (LASIX) 40 MG tablet TAKE 1 TABLET BY MOUTH EVERY DAY  . hydrALAZINE (APRESOLINE) 100 MG tablet TAKE 1 TABLET(100 MG) BY MOUTH TWICE DAILY  . isosorbide mononitrate (IMDUR) 120 MG 24 hr tablet Take 1 tablet (120 mg total) by mouth daily.  Marland Kitchen losartan (COZAAR) 100 MG tablet TAKE 1 TABLET(100 MG) BY MOUTH EVERY MORNING  . metoprolol tartrate (LOPRESSOR) 25 MG tablet TAKE 1 TABLET(25 MG) BY MOUTH TWICE DAILY  . nitroGLYCERIN (NITROSTAT) 0.4 MG SL tablet PLACE 1 TABLET UNDER THE TONGUE  EVERY 5 MINUTES AS NEEDED FOR CHEST PAIN, SEEK MEDICAL ATTENTION IF NO RELIEF   No Known Allergies   Review of Systems    Objective:   BP 120/72 (BP Location: Left Arm, Patient Position: Sitting, Cuff Size: Normal)   Pulse 72   Resp 12   Ht 5' 5.5" (1.664 m)   Wt 132 lb (59.9 kg)   BMI 21.63 kg/m   Physical Exam  NAD Looks well, though does have a junky cough HEENT:  PERRL, EOMI, TMs pearly gray and canals clear, unable to see throat well.  Nasal mucosa a bit swollen.  NT over sinuses Neck:  Supple, No adenopathy Chest:  Lot of upper airway noise.  Perhaps some crackles in R lower posterior lung field. CV:  RRR with normal S1 and S2, No S3, S4 or murmur.  Radial and DP pulses normal and equal. Abd:  S, NT, No HSM.  Has a soft tissue mass in right inguinal area.  Does not extend down into scrotum.  Unable to reduce, but does feel like has hernia opening about the proximal aspect.  Tender.  No overlying erythema or other discoloration.   Assessment & Plan   1.  Difficulties with medications.  Will set them up with home delivery  through [place] with pre made blister packs.    2.  Hypertension:  Controlled.  3.  Right inguinal hernia:  Believe this is a hernia.  He states he can gradually reduce at times, but unable to do so currently.  Surgical referral.  If increased pain, inability to pass gas or stool, nausea or vomiting, needs to go to ED. Will see if can get him in next 2 days.  4.  Cough:  Phlegm seems to be coming from throat.  Not clear if infection or allergies or lower lung field.  Will treat with Azithromycin for possible sinus infection and CXR to evaluate lungs.  Encouraged him to quit smoking.  Have tried patches with him in the past, but he did not use.  5.  [employer] nursing support:  Will notify of phone number and have written student names with patient's discharge papers  6.  Decreased hearing:  Audiology referral.

## 2019-11-30 ENCOUNTER — Ambulatory Visit
Admission: RE | Admit: 2019-11-30 | Discharge: 2019-11-30 | Disposition: A | Discharge status: Home / Self Care | Payer: Medicare Other | Source: Ambulatory Visit | Attending: Internal Medicine | Admitting: Internal Medicine

## 2019-11-30 DIAGNOSIS — R058 Other specified cough: Secondary | ICD-10-CM

## 2019-12-04 ENCOUNTER — Other Ambulatory Visit: Payer: Self-pay | Admitting: Internal Medicine

## 2019-12-04 DIAGNOSIS — I10 Essential (primary) hypertension: Secondary | ICD-10-CM

## 2019-12-04 DIAGNOSIS — I2510 Atherosclerotic heart disease of native coronary artery without angina pectoris: Secondary | ICD-10-CM

## 2019-12-04 MED ORDER — AMLODIPINE BESYLATE 10 MG PO TABS
ORAL_TABLET | ORAL | 11 refills | Status: DC
Start: 2019-12-04 — End: 2020-03-05

## 2019-12-04 MED ORDER — NITROGLYCERIN 0.4 MG SL SUBL
SUBLINGUAL_TABLET | SUBLINGUAL | 1 refills | Status: DC
Start: 2019-12-04 — End: 2021-10-20

## 2019-12-04 MED ORDER — HYDRALAZINE HCL 100 MG PO TABS
ORAL_TABLET | ORAL | 11 refills | Status: DC
Start: 2019-12-04 — End: 2020-11-05

## 2019-12-04 MED ORDER — METOPROLOL TARTRATE 25 MG PO TABS
ORAL_TABLET | ORAL | 11 refills | Status: DC
Start: 2019-12-04 — End: 2020-11-05

## 2019-12-04 MED ORDER — ASPIRIN EC 81 MG PO TBEC: DELAYED_RELEASE_TABLET | ORAL | 11 refills | Status: DC

## 2019-12-04 MED ORDER — LOSARTAN POTASSIUM 100 MG PO TABS
ORAL_TABLET | ORAL | 11 refills | Status: DC
Start: 2019-12-04 — End: 2020-11-05

## 2019-12-04 MED ORDER — FUROSEMIDE 40 MG PO TABS
ORAL_TABLET | ORAL | 11 refills | Status: DC
Start: 2019-12-04 — End: 2020-03-05

## 2019-12-04 MED ORDER — ISOSORBIDE MONONITRATE ER 120 MG PO TB24
ORAL_TABLET | ORAL | 11 refills | Status: DC
Start: 2019-12-04 — End: 2020-11-05

## 2019-12-04 MED ORDER — ATORVASTATIN CALCIUM 40 MG PO TABS
ORAL_TABLET | ORAL | 11 refills | Status: DC
Start: 2019-12-04 — End: 2020-11-05

## 2019-12-04 NOTE — Telephone Encounter (Signed)
[name] has agreed to the blister packs [place] and left message regarding both [name] getting set up with blister packaging and delivery. His daughter, [name], is going to call the pharmacy tomorrow and give her credit card to cover medication expense. Will speak with pharmacy tomorrow to be certain this is all set up.

## 2019-12-15 ENCOUNTER — Encounter: Payer: Self-pay | Admitting: Internal Medicine

## 2019-12-15 ENCOUNTER — Ambulatory Visit (INDEPENDENT_AMBULATORY_CARE_PROVIDER_SITE_OTHER): Payer: Medicare Other | Admitting: Internal Medicine

## 2019-12-15 VITALS — BP 132/52 | HR 56 | Resp 20 | Ht 65.5 in | Wt 134.0 lb

## 2019-12-15 DIAGNOSIS — Z716 Tobacco abuse counseling: Secondary | ICD-10-CM | POA: Diagnosis not present

## 2019-12-15 DIAGNOSIS — I10 Essential (primary) hypertension: Secondary | ICD-10-CM

## 2019-12-15 DIAGNOSIS — K4090 Unilateral inguinal hernia, without obstruction or gangrene, not specified as recurrent: Secondary | ICD-10-CM

## 2019-12-15 DIAGNOSIS — R058 Other specified cough: Secondary | ICD-10-CM

## 2019-12-15 DIAGNOSIS — I2510 Atherosclerotic heart disease of native coronary artery without angina pectoris: Secondary | ICD-10-CM

## 2019-12-15 DIAGNOSIS — Z720 Tobacco use: Secondary | ICD-10-CM

## 2019-12-15 NOTE — Progress Notes (Signed)
    Subjective:    Patient ID: [name], male   DOB: 1930-05-18, 86 y.o.   MRN: [id number]   HPI   1.  Cough:  Took Azithromycin and cough quickly improved.  States breathing is fine.  CXR was clear on 9/22  2.  Hypertension/ CAD:  Is on his first week of bubble packs that are delivered to home from [employer].  He has taken all his pills so far this week except for Tuesday night when he did not get home after his 107 yo grandson, Mariella Saa (also Mr. Statler's son's name) was found dead from an overdose.  He states he is doing okay with the information.  Remer Macho is this weekend.   3.  Right inguinal hernia:  States went away after he stopped coughing.  Not clear if he has heard from surgery  4.  Decreased hearing:  No knowledge of audiology calling.  5.  Needs tooth pulled.  States he saw a Pharmacist, [facility] on [address] and the dentist was going to call me to discuss whether he can have the tooth pulled.   Current Meds  Medication Sig  . amLODipine (NORVASC) 10 MG tablet 1 tab by mouth daily with evening meal  . aspirin EC 81 MG tablet 1 tab by mouth daily in morning with meal  . atorvastatin (LIPITOR) 40 MG tablet 1 tab by mouth daily with evening meal  . furosemide (LASIX) 40 MG tablet 1 tab by mouth daily with morning meal  . hydrALAZINE (APRESOLINE) 100 MG tablet 1 tab by mouth twice daily with morning and evening meals  . isosorbide mononitrate (IMDUR) 120 MG 24 hr tablet 1 tab by mouth daily with evening meal  . losartan (COZAAR) 100 MG tablet 1 tab by mouth daily with morning meal  . metoprolol tartrate (LOPRESSOR) 25 MG tablet 1 tab by mouth twice daily with morning and evening meals  . nitroGLYCERIN (NITROSTAT) 0.4 MG SL tablet PLACE 1 TABLET UNDER THE TONGUE EVERY 5 MINUTES AS NEEDED FOR CHEST PAIN, SEEK MEDICAL ATTENTION IF NO RELIEF   No Known Allergies   Review of Systems    Objective:   BP (!) 132/52 (BP Location: Left Arm, Patient Position: Sitting, Cuff Size: Normal)    Pulse (!) 56   Resp 20   Ht 5' 5.5" (1.664 m)   Wt 134 lb (60.8 kg)   BMI 21.96 kg/m   Physical Exam  Looks well, NAD HEENT:  MMM Neck:  Supple, No adenopathy Chest:  CTA CV:  RRR without murmur or rub  Radial and DP pulses normal and equal Abd/GU:  Right inguinal hernia with standing.  Much softer today and reducible LE:  No edema.   Assessment & Plan   1.  Cough:  Resolved and CXR was clear.    2.  Hypertension/CAD:  BP very good for Mr. Eberlin today and perhaps we have finally hit on how to keep him best organized with his meds.  His pulse is a bit low, but denies light headedness or weakness.  May need to taper down on beta blockade if bradycardia worsens or when symptomatic.  3.  Tobacco Abuse:  States he continues to decrease smoking.  He is not ready to quit.  Encouraged to do so.

## 2020-02-16 ENCOUNTER — Other Ambulatory Visit: Payer: Medicare Other | Admitting: Internal Medicine

## 2020-02-16 DIAGNOSIS — I2510 Atherosclerotic heart disease of native coronary artery without angina pectoris: Secondary | ICD-10-CM

## 2020-02-17 LAB — COMPREHENSIVE METABOLIC PANEL
ALT: 7 IU/L (ref 0–44)
AST: 14 IU/L (ref 0–40)
Albumin/Globulin Ratio: 0.9 — ABNORMAL LOW (ref 1.2–2.2)
Albumin: 3.7 g/dL (ref 3.6–4.6)
Alkaline Phosphatase: 86 IU/L (ref 44–121)
BUN/Creatinine Ratio: 16 (ref 10–24)
BUN: 39 mg/dL — ABNORMAL HIGH (ref 8–27)
Bilirubin Total: 0.4 mg/dL (ref 0.0–1.2)
CO2: 23 mmol/L (ref 20–29)
Calcium: 10.6 mg/dL — ABNORMAL HIGH (ref 8.6–10.2)
Chloride: 101 mmol/L (ref 96–106)
Creatinine, Ser: 2.39 mg/dL — ABNORMAL HIGH (ref 0.76–1.27)
GFR calc Af Amer: 27 mL/min/{1.73_m2} — ABNORMAL LOW (ref >59)
GFR calc non Af Amer: 23 mL/min/{1.73_m2} — ABNORMAL LOW (ref >59)
Globulin, Total: 4.2 g/dL (ref 1.5–4.5)
Glucose: 102 mg/dL — ABNORMAL HIGH (ref 65–99)
Potassium: 4.9 mmol/L (ref 3.5–5.2)
Sodium: 138 mmol/L (ref 134–144)
Total Protein: 7.9 g/dL (ref 6.0–8.5)

## 2020-02-17 LAB — CBC WITH DIFFERENTIAL/PLATELET
Basophils Absolute: 0.1 10*3/uL (ref 0.0–0.2)
Basos: 1 %
EOS (ABSOLUTE): 1.1 10*3/uL — ABNORMAL HIGH (ref 0.0–0.4)
Eos: 20 %
Hematocrit: 37.2 % — ABNORMAL LOW (ref 37.5–51.0)
Hemoglobin: 11.6 g/dL — ABNORMAL LOW (ref 13.0–17.7)
Immature Grans (Abs): 0 10*3/uL (ref 0.0–0.1)
Immature Granulocytes: 0 %
Lymphocytes Absolute: 1.8 10*3/uL (ref 0.7–3.1)
Lymphs: 30 %
MCH: 28.2 pg (ref 26.6–33.0)
MCHC: 31.2 g/dL — ABNORMAL LOW (ref 31.5–35.7)
MCV: 90 fL (ref 79–97)
Monocytes Absolute: 0.5 10*3/uL (ref 0.1–0.9)
Monocytes: 9 %
Neutrophils Absolute: 2.3 10*3/uL (ref 1.4–7.0)
Neutrophils: 40 %
Platelets: 135 10*3/uL — ABNORMAL LOW (ref 150–450)
RBC: 4.12 x10E6/uL — ABNORMAL LOW (ref 4.14–5.80)
RDW: 13 % (ref 11.6–15.4)
WBC: 5.8 10*3/uL (ref 3.4–10.8)

## 2020-02-17 LAB — LIPID PANEL W/O CHOL/HDL RATIO
Cholesterol, Total: 129 mg/dL (ref 100–199)
HDL: 51 mg/dL (ref >39)
LDL Chol Calc (NIH): 63 mg/dL (ref 0–99)
Triglycerides: 77 mg/dL (ref 0–149)
VLDL Cholesterol Cal: 15 mg/dL (ref 5–40)

## 2020-02-26 ENCOUNTER — Other Ambulatory Visit: Payer: Self-pay | Admitting: Internal Medicine

## 2020-03-01 ENCOUNTER — Other Ambulatory Visit: Payer: Self-pay | Admitting: Internal Medicine

## 2020-03-18 ENCOUNTER — Ambulatory Visit: Payer: Medicare Other | Admitting: Internal Medicine

## 2020-03-31 ENCOUNTER — Other Ambulatory Visit: Payer: Self-pay | Admitting: Internal Medicine

## 2020-04-03 NOTE — Telephone Encounter (Signed)
Can you call [name] and find out if he is no longer receiving meds from [facility]?  Not sure why I received refill request from [facility] as have not been using that for him I past several months. Also, did they get set back up with a home health person?

## 2020-04-09 NOTE — Telephone Encounter (Signed)
Patient and wife waiting for a new home health caregiver.   They have been instructed to continue receiving deliveries of prepackaged medication from [facility].

## 2020-04-09 NOTE — Telephone Encounter (Signed)
Patient has not changed his pharmacy and understand that he needs to continue using [facility] for the convenience of delivery. Patient stated that he received his RX delivery today.

## 2020-04-19 ENCOUNTER — Encounter: Payer: Self-pay | Admitting: Internal Medicine

## 2020-04-19 DIAGNOSIS — G119 Hereditary ataxia, unspecified: Secondary | ICD-10-CM | POA: Insufficient documentation

## 2020-05-20 ENCOUNTER — Other Ambulatory Visit: Payer: Medicare Other | Admitting: Internal Medicine

## 2020-07-16 ENCOUNTER — Other Ambulatory Visit: Payer: Self-pay

## 2020-07-16 ENCOUNTER — Ambulatory Visit (INDEPENDENT_AMBULATORY_CARE_PROVIDER_SITE_OTHER): Payer: Medicare Other | Admitting: Internal Medicine

## 2020-07-16 ENCOUNTER — Encounter: Payer: Self-pay | Admitting: Internal Medicine

## 2020-07-16 VITALS — BP 140/60 | HR 60 | Resp 14 | Ht 65.5 in | Wt 135.0 lb

## 2020-07-16 DIAGNOSIS — E785 Hyperlipidemia, unspecified: Secondary | ICD-10-CM

## 2020-07-16 DIAGNOSIS — I10 Essential (primary) hypertension: Secondary | ICD-10-CM

## 2020-07-16 DIAGNOSIS — R0989 Other specified symptoms and signs involving the circulatory and respiratory systems: Secondary | ICD-10-CM | POA: Diagnosis not present

## 2020-07-16 DIAGNOSIS — N1832 Chronic kidney disease, stage 3b: Secondary | ICD-10-CM

## 2020-07-16 DIAGNOSIS — H6121 Impacted cerumen, right ear: Secondary | ICD-10-CM | POA: Diagnosis not present

## 2020-07-16 DIAGNOSIS — J449 Chronic obstructive pulmonary disease, unspecified: Secondary | ICD-10-CM

## 2020-07-16 DIAGNOSIS — R058 Other specified cough: Secondary | ICD-10-CM

## 2020-07-16 DIAGNOSIS — Z720 Tobacco use: Secondary | ICD-10-CM

## 2020-07-16 DIAGNOSIS — E213 Hyperparathyroidism, unspecified: Secondary | ICD-10-CM

## 2020-07-16 DIAGNOSIS — I2510 Atherosclerotic heart disease of native coronary artery without angina pectoris: Secondary | ICD-10-CM

## 2020-07-16 NOTE — Progress Notes (Signed)
Subjective:    Patient ID: [name], male   DOB: April 27, 1930, 87 y.o.   MRN: [id number]   HPI   1.  Tearful-"I've had a rough life"  Not clear at first what he means.  Ultimately, he is sad that his wife, "[name]" who we know as [name] is not doing well and he is concerned she will not be with him much longer.  She is with hospice, but in home.   He would be willing to speak with Danton Clap, LCSW.    2.  Phlegm in throat.  Maybe worse when goes outside.   No itchy, watery eyes, nose or throat.  Usually white, though can be yellow.  Problems for about 1 month.  No sinus pressure or pain.  Has not taken anything for this.    3.  Hypertension:  The bubble packs delivered by [facility] have helped, but he forgets evening meals at least twice weekly.  Unable to get him to focus on why.  Sounds like he goes out in evening to buy a burger and forgets to take his meds when gets home.   He is not eating breakfast with his wife when health aide there to fix breakfast.   No swelling of legs. No chest pain Occasional shortness of breath Continues to smoke.  We have had multiple discussions about trying to stop and he has been resistant.  He is contemplating switching to vape.     Current Meds  Medication Sig   amLODipine (NORVASC) 10 MG tablet TAKE 1 TABLET(10 MG) BY MOUTH EVERY EVENING   aspirin EC 81 MG tablet 1 tab by mouth daily in morning with meal   atorvastatin (LIPITOR) 40 MG tablet 1 tab by mouth daily with evening meal   furosemide (LASIX) 40 MG tablet TAKE 1 TABLET BY MOUTH EVERY DAY   hydrALAZINE (APRESOLINE) 100 MG tablet 1 tab by mouth twice daily with morning and evening meals   isosorbide mononitrate (IMDUR) 120 MG 24 hr tablet 1 tab by mouth daily with evening meal   losartan (COZAAR) 100 MG tablet 1 tab by mouth daily with morning meal   metoprolol tartrate (LOPRESSOR) 25 MG tablet 1 tab by mouth twice daily with morning and evening meals   nitroGLYCERIN (NITROSTAT) 0.4  MG SL tablet PLACE 1 TABLET UNDER THE TONGUE EVERY 5 MINUTES AS NEEDED FOR CHEST PAIN, SEEK MEDICAL ATTENTION IF NO RELIEF   No Known Allergies   Review of Systems    Objective:   BP (!) 145/60 (BP Location: Left Arm, Patient Position: Sitting, Cuff Size: Normal)   Pulse 64   Resp 14   Ht 5' 5.5" (1.664 m)   Wt 135 lb (61.2 kg)   BMI 22.12 kg/m   Physical Exam NAD HEENT:  PERRL, EOMI, nasal mucosa boggy with clear to white discharge.  Throat without injection.  Left TM pearly gray.  Unable to see right due to cerumen impaction. Neck:  Supple, No adenopathy, no thyromegaly Chest:  CTA with decreased BS CV:  RRR with normal S1 and S2, No S3, S4 or murmur.  Right carotid bruit.  No left carotid bruit.  Carotid, radial and DP pulses normal and equal LE:  No edema.    Right carotid bruit--last ultrasound in 2019 showed 1-39% stenosis.   Assessment & Plan   Hypertension:  not at goal.  Encouraged him to eat breakfast with his wife and take meds.  Also to make sure he takes  his meds every night with meal, even when take out.  Asked him to bring in his current pill pack with visit.  Historically, not compliant with meds despite multiple home visits with family and HH aide and assigning duties to each person for follow up as well as prepackaged meds.    2.  Right carotid bruit:  Last doppler in 2019 with 1-39% stenosis.  Repeat Carotid doppler  3.  Allergies with productive cough.  Does not appear to have exacerbation of COPD, but does have Rx for albuterol should he need.  Loratadine 10 mg daily as needed.  Will keep separate from prefilled pill packs as only seasonal.  Call into [facility] with instructions on separated bottle.   4.  CAD:  stable/asymptomatic.  CBC  5.  CKD:  CMP  6.  Hyperparathyroidism:  CMP  7.  Right ear cerumen impaction:  return in next week for irrigation/curettage after placing lubricating ear drops to soften.  8.  Dyslipidemia:  lipids  9.   Tobacco abuse:  non committal to treatment to support quitting.

## 2020-07-17 LAB — COMPREHENSIVE METABOLIC PANEL
ALT: 7 IU/L (ref 0–44)
AST: 13 IU/L (ref 0–40)
Albumin/Globulin Ratio: 1 — ABNORMAL LOW (ref 1.2–2.2)
Albumin: 3.9 g/dL (ref 3.6–4.6)
Alkaline Phosphatase: 113 IU/L (ref 44–121)
BUN/Creatinine Ratio: 14 (ref 10–24)
BUN: 32 mg/dL — ABNORMAL HIGH (ref 8–27)
Bilirubin Total: 0.5 mg/dL (ref 0.0–1.2)
CO2: 23 mmol/L (ref 20–29)
Calcium: 11.2 mg/dL — ABNORMAL HIGH (ref 8.6–10.2)
Chloride: 101 mmol/L (ref 96–106)
Creatinine, Ser: 2.21 mg/dL — ABNORMAL HIGH (ref 0.76–1.27)
Globulin, Total: 3.9 g/dL (ref 1.5–4.5)
Glucose: 79 mg/dL (ref 65–99)
Potassium: 4.9 mmol/L (ref 3.5–5.2)
Sodium: 141 mmol/L (ref 134–144)
Total Protein: 7.8 g/dL (ref 6.0–8.5)
eGFR: 28 mL/min/{1.73_m2} — ABNORMAL LOW (ref >59)

## 2020-07-17 LAB — CBC WITH DIFFERENTIAL/PLATELET
Basophils Absolute: 0.1 10*3/uL (ref 0.0–0.2)
Basos: 1 %
EOS (ABSOLUTE): 1.5 10*3/uL — ABNORMAL HIGH (ref 0.0–0.4)
Eos: 19 %
Hematocrit: 38.5 % (ref 37.5–51.0)
Hemoglobin: 12.3 g/dL — ABNORMAL LOW (ref 13.0–17.7)
Immature Grans (Abs): 0 10*3/uL (ref 0.0–0.1)
Immature Granulocytes: 0 %
Lymphocytes Absolute: 1.1 10*3/uL (ref 0.7–3.1)
Lymphs: 14 %
MCH: 28.9 pg (ref 26.6–33.0)
MCHC: 31.9 g/dL (ref 31.5–35.7)
MCV: 91 fL (ref 79–97)
Monocytes Absolute: 0.8 10*3/uL (ref 0.1–0.9)
Monocytes: 10 %
Neutrophils Absolute: 4.4 10*3/uL (ref 1.4–7.0)
Neutrophils: 56 %
Platelets: 190 10*3/uL (ref 150–450)
RBC: 4.25 x10E6/uL (ref 4.14–5.80)
RDW: 13.6 % (ref 11.6–15.4)
WBC: 7.8 10*3/uL (ref 3.4–10.8)

## 2020-07-17 LAB — LIPID PANEL W/O CHOL/HDL RATIO
Cholesterol, Total: 180 mg/dL (ref 100–199)
HDL: 52 mg/dL (ref >39)
LDL Chol Calc (NIH): 116 mg/dL — ABNORMAL HIGH (ref 0–99)
Triglycerides: 65 mg/dL (ref 0–149)
VLDL Cholesterol Cal: 12 mg/dL (ref 5–40)

## 2020-07-26 ENCOUNTER — Telehealth: Payer: Self-pay | Admitting: Internal Medicine

## 2020-07-26 MED ORDER — LORATADINE 10 MG PO TABS
ORAL_TABLET | ORAL | 11 refills | Status: DC
Start: 2020-07-26 — End: 2021-06-20

## 2020-07-26 NOTE — Telephone Encounter (Signed)
Patient has not received his medication, he does not remember what the medication is called. (07/26/20).

## 2020-07-26 NOTE — Telephone Encounter (Signed)
[facility] confirmed they received medication and packed it up to deliver this afternoon. Patient was informed. 07/26/20.

## 2020-08-13 ENCOUNTER — Telehealth: Payer: Self-pay | Admitting: Clinical

## 2020-08-13 NOTE — Telephone Encounter (Signed)
LCSW contacted patient as a referral from PCP for counseling/check in due to wife in Hospice. LCSW introduced self to patient to see if he was interested. Patient reports that is fine and provided appointment for Thursday at 11am.

## 2020-08-15 ENCOUNTER — Other Ambulatory Visit: Payer: Self-pay

## 2020-08-15 ENCOUNTER — Ambulatory Visit: Payer: Medicare Other | Admitting: Clinical

## 2020-08-15 DIAGNOSIS — Z658 Other specified problems related to psychosocial circumstances: Secondary | ICD-10-CM

## 2020-08-19 NOTE — Progress Notes (Signed)
Case management check in at patient's home. 11-12pm   LCSW met with patient as a referral form from PCP following presenting tears during last PCP visit as he spoke about his wife who is currently receiving treatment with [facility]). LCSW introduced self to patient at his home and reason for visit. Patient reports he thought the visit was for the day prior, LCSW apologized for the misunderstanding. LCSW asked patient how he was today. Patient reports he is doing okay. It should be noted patient's CNA was present during this meeting and she also encouraged patient to talk to LCSW. LCSW used session time to get to know patient who he is, what he likes to do, where he is from, about his wife, and asked if he was getting support from [facility] staff as this is also one of their services. CNA shared when [name] presents to the home he (patient) will go outside as he believes they are only there for his wife.  LCSW discussed with patient to think about receiving this service of [facility] as he is [name]'s (wife) partner, he is also on the same journey along with his wife and it is important to receive support. Patient provided receptive to this.  LCSW asked patient what he likes to do he reports he likes to watch tv, and go outside. He still drives but doesn't go much anywhere. Patient reports he used to like to pay checkers and bingo (LCSW asked for activities) but he doesn't do them anymore. LCSW discussed importance of being active even if is doing these type of activities.  CNA shared concerns of patient's balance and said she found him working on the walkers tire. LCSW observed one of the tired worn off too. LCSW will report this to PCP for suggestions. Patient would like PCP to be aware too he wants to find a dentist to pull his teeth, sounds like previous dentist moved and was unable to continue his follow up.  LCSW asked if he would be okay for LCSW to return to check in management and perhaps move towards  counseling to process his wife's treatment with [facility]. Patient agreed next appointment 06/16 11am. LCSW thanked patient's CNA for her work with both patient and his wife.   LCSW also checked in with his wife to see how she was doing. Wife was laying in bed about to nap but reported she was doing okay as well.

## 2020-08-22 ENCOUNTER — Ambulatory Visit: Payer: Medicare Other | Admitting: Clinical

## 2020-08-22 ENCOUNTER — Other Ambulatory Visit: Payer: Self-pay

## 2020-08-22 DIAGNOSIS — Z658 Other specified problems related to psychosocial circumstances: Secondary | ICD-10-CM

## 2020-08-29 ENCOUNTER — Other Ambulatory Visit: Payer: Self-pay

## 2020-08-29 ENCOUNTER — Ambulatory Visit: Payer: Medicare Other | Admitting: Clinical

## 2020-08-29 DIAGNOSIS — Z658 Other specified problems related to psychosocial circumstances: Secondary | ICD-10-CM

## 2020-08-29 NOTE — Progress Notes (Signed)
LCSW met with patient for routine check in with goal to transition to therapy as patient hasn't accepted service from [facility] who is seeing his wife for support services. LCSW provided patient check in to assess how he is doing. Check in was completed outside. For this check in continued to ask questions to build therapeutic relationship of what he enjoys of being outside, how long has been living in this city. LCSW informed patient informed doctor of wheelchair replacement who reported will be making an order for one. Patient would also like to inform provider recommendation of dentistry to remove his teeth. Patient shared his history of tobacco use from a young age, also alcohol use but was able to stop. LCSW met with cna to check in of patient. CNA reports she has only observed forgetfulness.LCSW asked if okay to schedule new appointment for continued check in. LCSW did inform of termination date as LCSW will be leaving [facility]. Patient expressed understanding.

## 2020-08-30 ENCOUNTER — Telehealth: Payer: Self-pay | Admitting: Clinical

## 2020-08-30 NOTE — Progress Notes (Signed)
CASE MANAGEMENT  DATE: 08/29/2020  TIME: 11-11:50am WHERE: Patient's home.    LCSW met with patient to routine check in management. LCSW assessed for any needs at the time of session. LCSW asked patient how he was doing and feeling. Patient reported he was doing okay. Patient did ask LCSW status of his chair. LCSW will send telephone call to PCP as it was only verbalized to PCP. For this session to continue to develop therapeutic relationship and for client to become comfortable in sharing more how he is feeling LCSW played a memory game with him. LCSW informed of the rules and played two rounds. Patient reports he enjoyed this activity as it helped his brain work and concentrate. LCSW left patient with game so he could continue to practice mind games. LCSW took patient list of dentist however at his home noticed the addressed were on the other side of town.  LCSW checked in with CNA for any concerns.Marland Kitchen LCSW assessed for safety. LCSW offered next appointment for case management check in.   Next appointment 09/05/20 11am.

## 2020-09-05 ENCOUNTER — Other Ambulatory Visit: Payer: Medicare Other | Admitting: Clinical

## 2020-09-13 NOTE — Telephone Encounter (Signed)
Wants Dr Amil Amen to help request in-home nursing assistance for pt. Daughter that the nurse doing home visits is only for ms Mabel and not for [employer].

## 2020-09-17 ENCOUNTER — Other Ambulatory Visit: Payer: Medicare Other

## 2020-09-17 ENCOUNTER — Other Ambulatory Visit: Payer: Self-pay

## 2020-09-17 DIAGNOSIS — E785 Hyperlipidemia, unspecified: Secondary | ICD-10-CM

## 2020-09-18 ENCOUNTER — Ambulatory Visit: Payer: Medicare Other | Admitting: Clinical

## 2020-09-18 DIAGNOSIS — Z658 Other specified problems related to psychosocial circumstances: Secondary | ICD-10-CM

## 2020-09-18 NOTE — Progress Notes (Signed)
CASE MANAGEMENT  DATE: 09/18/20 TIME:11-11:15am WHERE: Patient's home   LCSW met with patient with CHW JoAnne to check in due to patient informing CHW of court date for possible eviction. CHW [place], [place] and LCSW met with patient to check in and assess for any needs. Patient reports no eviction. Patient reports he is okay. CHW JoAnne introduced [place] who will be leading community events and shared with him voting upcoming. LCSW spoke briefly with aid who informed she hasn't had any recent concerns for patient only of the walker. LCSW will informed PCP regarding walker. CHW JoAnne informed patient she will continue routine check in with patient. LCSW appropriately said good bye to patient and nursing aid as they were aware of LCSW last date.

## 2020-09-24 ENCOUNTER — Other Ambulatory Visit: Payer: Medicare Other | Admitting: Internal Medicine

## 2020-09-26 ENCOUNTER — Other Ambulatory Visit: Payer: Self-pay

## 2020-09-26 ENCOUNTER — Other Ambulatory Visit (INDEPENDENT_AMBULATORY_CARE_PROVIDER_SITE_OTHER): Payer: Medicare Other

## 2020-09-26 DIAGNOSIS — Z79899 Other long term (current) drug therapy: Secondary | ICD-10-CM

## 2020-09-26 DIAGNOSIS — E785 Hyperlipidemia, unspecified: Secondary | ICD-10-CM | POA: Diagnosis not present

## 2020-09-27 LAB — COMPREHENSIVE METABOLIC PANEL
ALT: 6 IU/L (ref 0–44)
AST: 14 IU/L (ref 0–40)
Albumin/Globulin Ratio: 0.8 — ABNORMAL LOW (ref 1.2–2.2)
Albumin: 3.7 g/dL (ref 3.6–4.6)
Alkaline Phosphatase: 96 IU/L (ref 44–121)
BUN/Creatinine Ratio: 12 (ref 10–24)
BUN: 26 mg/dL (ref 8–27)
Bilirubin Total: 0.5 mg/dL (ref 0.0–1.2)
CO2: 23 mmol/L (ref 20–29)
Calcium: 11.3 mg/dL — ABNORMAL HIGH (ref 8.6–10.2)
Chloride: 103 mmol/L (ref 96–106)
Creatinine, Ser: 2.15 mg/dL — ABNORMAL HIGH (ref 0.76–1.27)
Globulin, Total: 4.4 g/dL (ref 1.5–4.5)
Glucose: 81 mg/dL (ref 65–99)
Potassium: 5.1 mmol/L (ref 3.5–5.2)
Sodium: 140 mmol/L (ref 134–144)
Total Protein: 8.1 g/dL (ref 6.0–8.5)
eGFR: 29 mL/min/{1.73_m2} — ABNORMAL LOW (ref >59)

## 2020-09-27 LAB — LIPID PANEL W/O CHOL/HDL RATIO
Cholesterol, Total: 184 mg/dL (ref 100–199)
HDL: 48 mg/dL (ref >39)
LDL Chol Calc (NIH): 120 mg/dL — ABNORMAL HIGH (ref 0–99)
Triglycerides: 85 mg/dL (ref 0–149)
VLDL Cholesterol Cal: 16 mg/dL (ref 5–40)

## 2020-11-04 ENCOUNTER — Other Ambulatory Visit: Payer: Self-pay | Admitting: Internal Medicine

## 2020-11-18 ENCOUNTER — Other Ambulatory Visit: Payer: Medicare Other | Admitting: Internal Medicine

## 2020-11-25 ENCOUNTER — Telehealth: Payer: Self-pay | Admitting: Internal Medicine

## 2020-11-25 NOTE — Telephone Encounter (Signed)
Confirmed with the pharmacy that all of his medication, including atorvastatin, gets delivered regularly

## 2020-11-25 NOTE — Telephone Encounter (Signed)
Pt was scheduled 10/4 for labs and 10/7 for an office visit

## 2020-12-10 ENCOUNTER — Other Ambulatory Visit (INDEPENDENT_AMBULATORY_CARE_PROVIDER_SITE_OTHER): Payer: Medicare Other

## 2020-12-10 ENCOUNTER — Other Ambulatory Visit: Payer: Self-pay

## 2020-12-10 DIAGNOSIS — E785 Hyperlipidemia, unspecified: Secondary | ICD-10-CM | POA: Diagnosis not present

## 2020-12-10 DIAGNOSIS — Z79899 Other long term (current) drug therapy: Secondary | ICD-10-CM

## 2020-12-11 LAB — COMPREHENSIVE METABOLIC PANEL
ALT: 6 IU/L (ref 0–44)
AST: 10 IU/L (ref 0–40)
Albumin/Globulin Ratio: 0.9 — ABNORMAL LOW (ref 1.2–2.2)
Albumin: 3.9 g/dL (ref 3.6–4.6)
Alkaline Phosphatase: 120 IU/L (ref 44–121)
BUN/Creatinine Ratio: 14 (ref 10–24)
BUN: 29 mg/dL — ABNORMAL HIGH (ref 8–27)
Bilirubin Total: 0.5 mg/dL (ref 0.0–1.2)
CO2: 23 mmol/L (ref 20–29)
Calcium: 11.2 mg/dL — ABNORMAL HIGH (ref 8.6–10.2)
Chloride: 102 mmol/L (ref 96–106)
Creatinine, Ser: 2.1 mg/dL — ABNORMAL HIGH (ref 0.76–1.27)
Globulin, Total: 4.5 g/dL (ref 1.5–4.5)
Glucose: 81 mg/dL (ref 70–99)
Potassium: 4.8 mmol/L (ref 3.5–5.2)
Sodium: 139 mmol/L (ref 134–144)
Total Protein: 8.4 g/dL (ref 6.0–8.5)
eGFR: 30 mL/min/{1.73_m2} — ABNORMAL LOW (ref >59)

## 2020-12-11 LAB — CBC WITH DIFFERENTIAL/PLATELET
Basophils Absolute: 0.1 10*3/uL (ref 0.0–0.2)
Basos: 1 %
EOS (ABSOLUTE): 1.3 10*3/uL — ABNORMAL HIGH (ref 0.0–0.4)
Eos: 22 %
Hematocrit: 38.7 % (ref 37.5–51.0)
Hemoglobin: 12.5 g/dL — ABNORMAL LOW (ref 13.0–17.7)
Immature Grans (Abs): 0 10*3/uL (ref 0.0–0.1)
Immature Granulocytes: 0 %
Lymphocytes Absolute: 1.5 10*3/uL (ref 0.7–3.1)
Lymphs: 26 %
MCH: 28.3 pg (ref 26.6–33.0)
MCHC: 32.3 g/dL (ref 31.5–35.7)
MCV: 88 fL (ref 79–97)
Monocytes Absolute: 0.5 10*3/uL (ref 0.1–0.9)
Monocytes: 9 %
Neutrophils Absolute: 2.5 10*3/uL (ref 1.4–7.0)
Neutrophils: 42 %
Platelets: 213 10*3/uL (ref 150–450)
RBC: 4.42 x10E6/uL (ref 4.14–5.80)
RDW: 13.6 % (ref 11.6–15.4)
WBC: 5.9 10*3/uL (ref 3.4–10.8)

## 2020-12-11 LAB — LIPID PANEL W/O CHOL/HDL RATIO
Cholesterol, Total: 183 mg/dL (ref 100–199)
HDL: 54 mg/dL (ref >39)
LDL Chol Calc (NIH): 118 mg/dL — ABNORMAL HIGH (ref 0–99)
Triglycerides: 60 mg/dL (ref 0–149)
VLDL Cholesterol Cal: 11 mg/dL (ref 5–40)

## 2020-12-13 ENCOUNTER — Ambulatory Visit: Payer: Medicare Other | Admitting: Internal Medicine

## 2021-01-07 ENCOUNTER — Telehealth: Payer: Self-pay

## 2021-01-07 NOTE — Telephone Encounter (Signed)
[name] called asking for an immediate appt. Believes pt has UTI. Also noticed he is more forgetful, has tremors and complains more about body pains

## 2021-01-10 ENCOUNTER — Ambulatory Visit (INDEPENDENT_AMBULATORY_CARE_PROVIDER_SITE_OTHER): Payer: Medicare Other | Admitting: Internal Medicine

## 2021-01-10 ENCOUNTER — Encounter: Payer: Self-pay | Admitting: Internal Medicine

## 2021-01-10 ENCOUNTER — Other Ambulatory Visit: Payer: Self-pay

## 2021-01-10 VITALS — BP 152/82 | HR 72 | Resp 12 | Ht 65.5 in | Wt 130.0 lb

## 2021-01-10 DIAGNOSIS — I5032 Chronic diastolic (congestive) heart failure: Secondary | ICD-10-CM

## 2021-01-10 DIAGNOSIS — I10 Essential (primary) hypertension: Secondary | ICD-10-CM

## 2021-01-10 DIAGNOSIS — Z23 Encounter for immunization: Secondary | ICD-10-CM

## 2021-01-10 DIAGNOSIS — I2510 Atherosclerotic heart disease of native coronary artery without angina pectoris: Secondary | ICD-10-CM

## 2021-01-10 DIAGNOSIS — E213 Hyperparathyroidism, unspecified: Secondary | ICD-10-CM

## 2021-01-10 DIAGNOSIS — E785 Hyperlipidemia, unspecified: Secondary | ICD-10-CM | POA: Diagnosis not present

## 2021-01-10 DIAGNOSIS — G119 Hereditary ataxia, unspecified: Secondary | ICD-10-CM

## 2021-01-10 MED ORDER — ATORVASTATIN CALCIUM 80 MG PO TABS: ORAL_TABLET | ORAL | 11 refills | Status: DC

## 2021-01-10 NOTE — Progress Notes (Signed)
**Note Glen-Identified via Obfuscation** Subjective:    Patient ID: [name], male   DOB: 25-Mar-1930, 87 y.o.   MRN: [id number]   HPI  Daughter, [name], apparently called with concerns.  Not clear what they were and she is not here today to clarify.   [name] states last week he sprayed his sofa for roaches and then slept on the sofa all night.  The following morning, he was apparently dizzy and did not know what he was saying or doing for 2 days.  He is fine now.  States the symptoms and signs gradually went away over a 48 hour period.  Spoke with [name], [name] and [name]'s adopted daughter.  She was concerned he might have a UTI as he was acting so strange last week, but acknowledges he is back to baseline now. They are not happy with current home health for [name] and would like to see if both could obtain CAP.  Hospice for medical issues as well.     2  Primary Hyperparathyroidism:  Calcium remains at relatively stable level at 11.2.  He takes Furosemide 40 mg daily for this and BP.    3.  Hypertension:  states he rarely misses meds now with delivery of bubble packs from [name].  Mert supports this.  BP not controlled, but has not taken morning meds.    4.  CKD:  stable with Crea of 2.10 and BUN 29.  5.  Hypercholesterolemia:  LDL recently at 118.  Goal less than 70.  Atorvastatin 40 mg.    6.  Diastolic HF/CAD:  No chest pain.  No dyspnea.  No LE edema.    7.  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    Current Meds  Medication Sig   aspirin (ASPIRIN LOW DOSE) 81 MG EC tablet TAKE 1 TABLET BY MOUTH EVERY MORNING WITH A MEAL   atorvastatin (LIPITOR) 40 MG tablet TAKE 1 TABLET BY MOUTH EVERY EVENING WITH A MEAL   furosemide (LASIX) 40 MG tablet TAKE 1 TABLET BY MOUTH EVERY DAY WITH morning meal   hydrALAZINE (APRESOLINE) 100 MG tablet TAKE 1 TABLET BY MOUTH 2 TIMES DAILY WITH morning and evening meals    losartan (COZAAR) 100 MG tablet TAKE 1 TABLET BY MOUTH EVERY DAY WITH morning meal   No Known Allergies   Review of Systems    Objective:   BP (!) 152/82 (BP Location: Left Arm, Patient Position: Sitting, Cuff Size: Normal)   Pulse 72   Resp 12   Ht 5' 5.5" (1.664 m)   Wt 130 lb (59 kg)   BMI 21.30 kg/m   Physical Exam NAD HEENT:  PERRL, though pupils dialated.  EOMI, TMs pearly gray, throat without injection, MMM Neck:  Supple, No adenopathy Chest:  CTA CV:  RRR with normal S1 and S2, No S3, S4 or murmur.  Radial pulses normal and equal Abd:  S, NT, No HSm or mass, + BS LE:  No edema. Neuro:  A & O x 3, CN II-XII grossly intact.  4/5 weakness of RUE, ataxic gait unchanged    Assessment & Plan    Hypertension:  Not at goal.  Getting prepackaged meds from [name] delivered still.  Not clear if he is taking as directed again.  Needs home visit to check meds.  Chronic issue with meds, despite multiple attempts at intervention. CAP and Hospice for  both [name]  2.  Primary Hyperparathyroidism:  not a good surgical candidate and asymptomatic/stable at this time.  3.  CKD:  Stable.  Attempt to continue to control BP.  As in #1  4.  Hypercholesterolemia:  LDL not at goal.  Increase Atorvastatin to 80 mg.  Notified [name] pharmacy of change for bubble packs.  5.  Diastolic HF/CAD:  stable, despite poor control of BP.  6.  Cerebellar ataxia:  stable--no new neurologic changes noted.

## 2021-01-13 NOTE — Telephone Encounter (Signed)
Pt had a visit with Dr Amil Amen

## 2021-02-07 ENCOUNTER — Ambulatory Visit: Payer: Medicare Other | Admitting: Internal Medicine
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
## 2021-03-19 ENCOUNTER — Ambulatory Visit: Payer: Medicare Other | Admitting: Nurse Practitioner

## 2021-04-11 ENCOUNTER — Other Ambulatory Visit: Payer: Medicare Other

## 2021-04-14 ENCOUNTER — Other Ambulatory Visit: Payer: Medicare Other

## 2021-04-16 ENCOUNTER — Encounter: Payer: Self-pay | Admitting: Internal Medicine

## 2021-04-16 ENCOUNTER — Ambulatory Visit (INDEPENDENT_AMBULATORY_CARE_PROVIDER_SITE_OTHER): Payer: Medicare Other | Admitting: Internal Medicine

## 2021-04-16 ENCOUNTER — Other Ambulatory Visit: Payer: Self-pay

## 2021-04-16 VITALS — BP 190/92 | HR 80 | Resp 20 | Ht 65.5 in | Wt 134.0 lb

## 2021-04-16 DIAGNOSIS — I2510 Atherosclerotic heart disease of native coronary artery without angina pectoris: Secondary | ICD-10-CM | POA: Diagnosis not present

## 2021-04-16 DIAGNOSIS — I10 Essential (primary) hypertension: Secondary | ICD-10-CM

## 2021-04-16 DIAGNOSIS — E213 Hyperparathyroidism, unspecified: Secondary | ICD-10-CM

## 2021-04-16 DIAGNOSIS — E785 Hyperlipidemia, unspecified: Secondary | ICD-10-CM

## 2021-04-16 DIAGNOSIS — G119 Hereditary ataxia, unspecified: Secondary | ICD-10-CM

## 2021-04-16 LAB — COMPREHENSIVE METABOLIC PANEL
ALT: 8 IU/L (ref 0–44)
AST: 22 IU/L (ref 0–40)
Albumin/Globulin Ratio: 0.9 — ABNORMAL LOW (ref 1.2–2.2)
Albumin: 4.1 g/dL (ref 3.6–4.6)
Alkaline Phosphatase: 101 IU/L (ref 44–121)
BUN/Creatinine Ratio: 15 (ref 10–24)
BUN: 34 mg/dL — ABNORMAL HIGH (ref 8–27)
Bilirubin Total: 0.6 mg/dL (ref 0.0–1.2)
CO2: 22 mmol/L (ref 20–29)
Calcium: 11.5 mg/dL — ABNORMAL HIGH (ref 8.6–10.2)
Chloride: 104 mmol/L (ref 96–106)
Creatinine, Ser: 2.21 mg/dL — ABNORMAL HIGH (ref 0.76–1.27)
Globulin, Total: 4.5 g/dL (ref 1.5–4.5)
Glucose: 82 mg/dL (ref 70–99)
Potassium: 5 mmol/L (ref 3.5–5.2)
Sodium: 140 mmol/L (ref 134–144)
Total Protein: 8.6 g/dL — ABNORMAL HIGH (ref 6.0–8.5)
eGFR: 28 mL/min/{1.73_m2} — ABNORMAL LOW (ref >59)

## 2021-04-16 LAB — LIPID PANEL W/O CHOL/HDL RATIO
Cholesterol, Total: 201 mg/dL — ABNORMAL HIGH (ref 100–199)
HDL: 61 mg/dL (ref >39)
LDL Chol Calc (NIH): 128 mg/dL — ABNORMAL HIGH (ref 0–99)
Triglycerides: 64 mg/dL (ref 0–149)
VLDL Cholesterol Cal: 12 mg/dL (ref 5–40)

## 2021-04-16 NOTE — Progress Notes (Signed)
° ° °  Subjective:    Patient ID: [name], male   DOB: 09/24/1930, 88 y.o.   MRN: [id number]   HPI  His wife, Cori Razor [place]) is now in a nursing home.  Apparently moved there 3 weeks ago.  She has not been very interactive even before moving there.  He will move to senior housing off [address] end of this month--does not know name of place or address currently.    He is here for medical management, but did not bring meds with him today.  Meds still being delivered to his home, but he is not taking them as prescribed.  States he feels drunk if takes them all.  Has not been taking much of his medication for past month.  He denies wish to die with what is going on with Mabel.   Discussed his cholesterol is higher and still not at goal.  His kidney function has also declined a bit as well.    Does occasionally have chest pain.  Short lived--takes a NTG and goes away.    Now allowing himself to be driven around by a family friend instead of doing much of his own driving, which has been encouraged for some time.  Followed patient and his friend home to see what he is doing with meds.  He showed me the last delivery of bubble packed meds.  Dated 03/04/2021 and bag has not been opened.  So, not taking any of his meds since end of December.   Current Meds  Medication Sig   aspirin (ASPIRIN LOW DOSE) 81 MG EC tablet TAKE 1 TABLET BY MOUTH EVERY MORNING WITH A MEAL   No Known Allergies   Review of Systems    Objective:   BP (!) 190/92 (BP Location: Left Arm, Patient Position: Sitting, Cuff Size: Normal)    Pulse 80    Resp 20    Ht 5' 5.5" (1.664 m)    Wt 134 lb (60.8 kg)    BMI 21.96 kg/m   Physical Exam NAD HEENT:  PERRL, EOMI Chest:  CTA CV:  RRR without murmur or rub.  Radial and DP pulses normal and equal. LE:  No edema. Coordination difficulties remain.  At his home.  Floor in kitchen is filthy with multiple sticky insect sheets covered with roaches.  House smells strongly of  cigarette smoke and is quite warm.  Living room area is much cleaner than usual.     Assessment & Plan    Hypertension/CAD/hyperlipidemia/CKD:  discussed none of this is under control.  Discussed he is likely not tolerating meds as he starts and stops.  He needs to take them regularly to allow his body to get reaccustomed to them.  He was doing fine with them at one time.   He will let us know what his address will be.  Will look into CAP for him as does have Medicaid.    Follow up in 1 month after back on meds.

## 2021-05-02 ENCOUNTER — Inpatient Hospital Stay (HOSPITAL_COMMUNITY): Payer: Medicare Other | Admitting: Anesthesiology

## 2021-05-02 ENCOUNTER — Emergency Department (HOSPITAL_COMMUNITY): Payer: Medicare Other

## 2021-05-02 ENCOUNTER — Encounter (HOSPITAL_COMMUNITY): Payer: Self-pay

## 2021-05-02 ENCOUNTER — Encounter (HOSPITAL_COMMUNITY)
Admission: EM | Disposition: A | Discharge status: Skilled Nursing Facility | Payer: Self-pay | Source: Home / Self Care | Attending: Family Medicine

## 2021-05-02 ENCOUNTER — Inpatient Hospital Stay (HOSPITAL_COMMUNITY): Payer: Medicare Other

## 2021-05-02 ENCOUNTER — Other Ambulatory Visit: Payer: Self-pay

## 2021-05-02 ENCOUNTER — Inpatient Hospital Stay (HOSPITAL_COMMUNITY)
Admission: EM | Admit: 2021-05-02 | Discharge: 2021-05-12 | DRG: 351 | Disposition: A | Discharge status: Skilled Nursing Facility | Payer: Medicare Other | Attending: Family Medicine | Admitting: Family Medicine

## 2021-05-02 DIAGNOSIS — K460 Unspecified abdominal hernia with obstruction, without gangrene: Secondary | ICD-10-CM

## 2021-05-02 DIAGNOSIS — J449 Chronic obstructive pulmonary disease, unspecified: Secondary | ICD-10-CM

## 2021-05-02 DIAGNOSIS — E213 Hyperparathyroidism, unspecified: Secondary | ICD-10-CM | POA: Diagnosis present

## 2021-05-02 DIAGNOSIS — E785 Hyperlipidemia, unspecified: Secondary | ICD-10-CM | POA: Diagnosis present

## 2021-05-02 DIAGNOSIS — K4030 Unilateral inguinal hernia, with obstruction, without gangrene, not specified as recurrent: Secondary | ICD-10-CM | POA: Diagnosis present

## 2021-05-02 DIAGNOSIS — T450X5A Adverse effect of antiallergic and antiemetic drugs, initial encounter: Secondary | ICD-10-CM | POA: Diagnosis not present

## 2021-05-02 DIAGNOSIS — I129 Hypertensive chronic kidney disease with stage 1 through stage 4 chronic kidney disease, or unspecified chronic kidney disease: Secondary | ICD-10-CM | POA: Diagnosis present

## 2021-05-02 DIAGNOSIS — Z8673 Personal history of transient ischemic attack (TIA), and cerebral infarction without residual deficits: Secondary | ICD-10-CM

## 2021-05-02 DIAGNOSIS — Z823 Family history of stroke: Secondary | ICD-10-CM

## 2021-05-02 DIAGNOSIS — N184 Chronic kidney disease, stage 4 (severe): Secondary | ICD-10-CM

## 2021-05-02 DIAGNOSIS — F17210 Nicotine dependence, cigarettes, uncomplicated: Secondary | ICD-10-CM | POA: Diagnosis present

## 2021-05-02 DIAGNOSIS — Z800 Family history of malignant neoplasm of digestive organs: Secondary | ICD-10-CM | POA: Diagnosis not present

## 2021-05-02 DIAGNOSIS — Z9049 Acquired absence of other specified parts of digestive tract: Secondary | ICD-10-CM

## 2021-05-02 DIAGNOSIS — I10 Essential (primary) hypertension: Secondary | ICD-10-CM | POA: Diagnosis present

## 2021-05-02 DIAGNOSIS — Z8546 Personal history of malignant neoplasm of prostate: Secondary | ICD-10-CM

## 2021-05-02 DIAGNOSIS — Z4659 Encounter for fitting and adjustment of other gastrointestinal appliance and device: Secondary | ICD-10-CM

## 2021-05-02 DIAGNOSIS — E782 Mixed hyperlipidemia: Secondary | ICD-10-CM | POA: Diagnosis present

## 2021-05-02 DIAGNOSIS — D631 Anemia in chronic kidney disease: Secondary | ICD-10-CM | POA: Diagnosis present

## 2021-05-02 DIAGNOSIS — I252 Old myocardial infarction: Secondary | ICD-10-CM

## 2021-05-02 DIAGNOSIS — Z20822 Contact with and (suspected) exposure to covid-19: Secondary | ICD-10-CM | POA: Diagnosis present

## 2021-05-02 DIAGNOSIS — N50819 Testicular pain, unspecified: Secondary | ICD-10-CM

## 2021-05-02 DIAGNOSIS — F05 Delirium due to known physiological condition: Secondary | ICD-10-CM | POA: Diagnosis not present

## 2021-05-02 DIAGNOSIS — T424X5A Adverse effect of benzodiazepines, initial encounter: Secondary | ICD-10-CM | POA: Diagnosis not present

## 2021-05-02 DIAGNOSIS — I2510 Atherosclerotic heart disease of native coronary artery without angina pectoris: Secondary | ICD-10-CM | POA: Diagnosis present

## 2021-05-02 DIAGNOSIS — Z8042 Family history of malignant neoplasm of prostate: Secondary | ICD-10-CM | POA: Diagnosis not present

## 2021-05-02 DIAGNOSIS — Z955 Presence of coronary angioplasty implant and graft: Secondary | ICD-10-CM | POA: Diagnosis not present

## 2021-05-02 DIAGNOSIS — I214 Non-ST elevation (NSTEMI) myocardial infarction: Secondary | ICD-10-CM

## 2021-05-02 DIAGNOSIS — D62 Acute posthemorrhagic anemia: Secondary | ICD-10-CM | POA: Diagnosis not present

## 2021-05-02 DIAGNOSIS — Z8249 Family history of ischemic heart disease and other diseases of the circulatory system: Secondary | ICD-10-CM

## 2021-05-02 DIAGNOSIS — K56609 Unspecified intestinal obstruction, unspecified as to partial versus complete obstruction: Secondary | ICD-10-CM

## 2021-05-02 DIAGNOSIS — N1832 Chronic kidney disease, stage 3b: Secondary | ICD-10-CM | POA: Diagnosis present

## 2021-05-02 DIAGNOSIS — Z85038 Personal history of other malignant neoplasm of large intestine: Secondary | ICD-10-CM

## 2021-05-02 DIAGNOSIS — T443X5A Adverse effect of other parasympatholytics [anticholinergics and antimuscarinics] and spasmolytics, initial encounter: Secondary | ICD-10-CM | POA: Diagnosis not present

## 2021-05-02 HISTORY — PX: INGUINAL HERNIA REPAIR: SHX194

## 2021-05-02 LAB — CBC WITH DIFFERENTIAL/PLATELET
Abs Immature Granulocytes: 0.01 10*3/uL (ref 0.00–0.07)
Basophils Absolute: 0 10*3/uL (ref 0.0–0.1)
Basophils Relative: 1 %
Eosinophils Absolute: 0.2 10*3/uL (ref 0.0–0.5)
Eosinophils Relative: 3 %
HCT: 43.7 % (ref 39.0–52.0)
Hemoglobin: 13.9 g/dL (ref 13.0–17.0)
Immature Granulocytes: 0 %
Lymphocytes Relative: 18 %
Lymphs Abs: 1.2 10*3/uL (ref 0.7–4.0)
MCH: 28.1 pg (ref 26.0–34.0)
MCHC: 31.8 g/dL (ref 30.0–36.0)
MCV: 88.5 fL (ref 80.0–100.0)
Monocytes Absolute: 0.5 10*3/uL (ref 0.1–1.0)
Monocytes Relative: 7 %
Neutro Abs: 4.8 10*3/uL (ref 1.7–7.7)
Neutrophils Relative %: 71 %
Platelets: 202 10*3/uL (ref 150–400)
RBC: 4.94 MIL/uL (ref 4.22–5.81)
RDW: 13.8 % (ref 11.5–15.5)
WBC: 6.7 10*3/uL (ref 4.0–10.5)
nRBC: 0 % (ref 0.0–0.2)

## 2021-05-02 LAB — TYPE AND SCREEN
ABO/RH(D): A POS
Antibody Screen: NEGATIVE

## 2021-05-02 LAB — COMPREHENSIVE METABOLIC PANEL
ALT: 9 U/L (ref 0–44)
AST: 17 U/L (ref 15–41)
Albumin: 3.2 g/dL — ABNORMAL LOW (ref 3.5–5.0)
Alkaline Phosphatase: 79 U/L (ref 38–126)
Anion gap: 7 (ref 5–15)
BUN: 37 mg/dL — ABNORMAL HIGH (ref 8–23)
CO2: 24 mmol/L (ref 22–32)
Calcium: 10.9 mg/dL — ABNORMAL HIGH (ref 8.9–10.3)
Chloride: 104 mmol/L (ref 98–111)
Creatinine, Ser: 2.39 mg/dL — ABNORMAL HIGH (ref 0.61–1.24)
GFR, Estimated: 25 mL/min — ABNORMAL LOW (ref >60)
Glucose, Bld: 121 mg/dL — ABNORMAL HIGH (ref 70–99)
Potassium: 4.9 mmol/L (ref 3.5–5.1)
Sodium: 135 mmol/L (ref 135–145)
Total Bilirubin: 0.6 mg/dL (ref 0.3–1.2)
Total Protein: 8.1 g/dL (ref 6.5–8.1)

## 2021-05-02 LAB — RESP PANEL BY RT-PCR (FLU A&B, COVID) ARPGX2
Influenza A by PCR: NEGATIVE
Influenza B by PCR: NEGATIVE
SARS Coronavirus 2 by RT PCR: NEGATIVE

## 2021-05-02 LAB — LACTIC ACID, PLASMA
Lactic Acid, Venous: 2.2 mmol/L (ref 0.5–1.9)
Lactic Acid, Venous: 2.7 mmol/L (ref 0.5–1.9)

## 2021-05-02 LAB — TROPONIN I (HIGH SENSITIVITY)
Troponin I (High Sensitivity): 25 ng/L — ABNORMAL HIGH (ref <18)
Troponin I (High Sensitivity): 22 ng/L — ABNORMAL HIGH (ref <18)

## 2021-05-02 LAB — BRAIN NATRIURETIC PEPTIDE: B Natriuretic Peptide: 349.8 pg/mL — ABNORMAL HIGH (ref 0.0–100.0)

## 2021-05-02 LAB — LIPASE, BLOOD: Lipase: 29 U/L (ref 11–51)

## 2021-05-02 SURGERY — REPAIR, HERNIA, INGUINAL, ADULT
Anesthesia: General | Site: Inguinal | Laterality: Right

## 2021-05-02 MED ORDER — PHENYLEPHRINE 40 MCG/ML (10ML) SYRINGE FOR IV PUSH (FOR BLOOD PRESSURE SUPPORT)
PREFILLED_SYRINGE | INTRAVENOUS | Status: DC | PRN
  Administered 2021-05-02: 40 ug via INTRAVENOUS
  Administered 2021-05-02: 120 ug via INTRAVENOUS
  Administered 2021-05-02: 40 ug via INTRAVENOUS
  Administered 2021-05-02: 120 ug via INTRAVENOUS
  Administered 2021-05-02: 80 ug via INTRAVENOUS
  Administered 2021-05-02: 40 ug via INTRAVENOUS
  Administered 2021-05-02: 80 ug via INTRAVENOUS

## 2021-05-02 MED ORDER — ONDANSETRON HCL 4 MG PO TABS: 4.0000 mg | ORAL_TABLET | Freq: Four times a day (QID) | ORAL | Status: DC | PRN

## 2021-05-02 MED ORDER — FENTANYL CITRATE (PF) 250 MCG/5ML IJ SOLN
INTRAMUSCULAR | Status: AC
  Filled 2021-05-02: qty 5

## 2021-05-02 MED ORDER — 0.9 % SODIUM CHLORIDE (POUR BTL) OPTIME
TOPICAL | Status: DC | PRN
  Administered 2021-05-02: 1000 mL

## 2021-05-02 MED ORDER — DEXAMETHASONE SODIUM PHOSPHATE 10 MG/ML IJ SOLN
INTRAMUSCULAR | Status: DC | PRN
Start: 2021-05-02 — End: 2021-05-02
  Administered 2021-05-02: 5 mg via INTRAVENOUS

## 2021-05-02 MED ORDER — FENTANYL CITRATE (PF) 100 MCG/2ML IJ SOLN: 25.0000 ug | INTRAMUSCULAR | Status: DC | PRN

## 2021-05-02 MED ORDER — PHENYLEPHRINE 40 MCG/ML (10ML) SYRINGE FOR IV PUSH (FOR BLOOD PRESSURE SUPPORT)
PREFILLED_SYRINGE | INTRAVENOUS | Status: AC
  Filled 2021-05-02: qty 10

## 2021-05-02 MED ORDER — EPHEDRINE 5 MG/ML INJ
INTRAVENOUS | Status: AC
  Filled 2021-05-02: qty 5

## 2021-05-02 MED ORDER — SODIUM CHLORIDE 0.9 % IV SOLN
2.0000 g | INTRAVENOUS | Status: AC
  Administered 2021-05-02: 2 g via INTRAVENOUS
  Filled 2021-05-02: qty 20

## 2021-05-02 MED ORDER — ONDANSETRON HCL 4 MG/2ML IJ SOLN
INTRAMUSCULAR | Status: DC | PRN
  Administered 2021-05-02: 4 mg via INTRAVENOUS

## 2021-05-02 MED ORDER — CHLORHEXIDINE GLUCONATE 0.12 % MT SOLN
OROMUCOSAL | Status: AC
  Administered 2021-05-02: 15 mL via OROMUCOSAL
  Filled 2021-05-02: qty 15

## 2021-05-02 MED ORDER — HEPARIN SODIUM (PORCINE) 5000 UNIT/ML IJ SOLN
5000.0000 [IU] | Freq: Two times a day (BID) | INTRAMUSCULAR | Status: DC
  Administered 2021-05-03 – 2021-05-12 (×19): 5000 [IU] via SUBCUTANEOUS
  Filled 2021-05-02 (×21): qty 1

## 2021-05-02 MED ORDER — ROCURONIUM BROMIDE 10 MG/ML (PF) SYRINGE
PREFILLED_SYRINGE | INTRAVENOUS | Status: AC
  Filled 2021-05-02: qty 10

## 2021-05-02 MED ORDER — HYDRALAZINE HCL 20 MG/ML IJ SOLN
5.0000 mg | Freq: Four times a day (QID) | INTRAMUSCULAR | Status: DC | PRN
  Administered 2021-05-04 – 2021-05-08 (×2): 5 mg via INTRAVENOUS
  Filled 2021-05-02 (×3): qty 1

## 2021-05-02 MED ORDER — LIDOCAINE 2% (20 MG/ML) 5 ML SYRINGE
INTRAMUSCULAR | Status: DC | PRN
Start: 2021-05-02 — End: 2021-05-02
  Administered 2021-05-02: 60 mg via INTRAVENOUS

## 2021-05-02 MED ORDER — SUGAMMADEX SODIUM 200 MG/2ML IV SOLN
INTRAVENOUS | Status: DC | PRN
  Administered 2021-05-02: 200 mg via INTRAVENOUS

## 2021-05-02 MED ORDER — BUPIVACAINE-EPINEPHRINE (PF) 0.25% -1:200000 IJ SOLN
INTRAMUSCULAR | Status: AC
  Filled 2021-05-02: qty 30

## 2021-05-02 MED ORDER — LIDOCAINE 2% (20 MG/ML) 5 ML SYRINGE
INTRAMUSCULAR | Status: AC
  Filled 2021-05-02: qty 5

## 2021-05-02 MED ORDER — ROCURONIUM BROMIDE 10 MG/ML (PF) SYRINGE
PREFILLED_SYRINGE | INTRAVENOUS | Status: DC | PRN
  Administered 2021-05-02: 50 mg via INTRAVENOUS

## 2021-05-02 MED ORDER — ONDANSETRON HCL 4 MG/2ML IJ SOLN
4.0000 mg | Freq: Once | INTRAMUSCULAR | Status: AC
  Administered 2021-05-02: 4 mg via INTRAVENOUS
  Filled 2021-05-02: qty 2

## 2021-05-02 MED ORDER — SODIUM CHLORIDE 0.9 % IV SOLN
INTRAVENOUS | Status: DC
Start: 2021-05-02 — End: 2021-05-02

## 2021-05-02 MED ORDER — PROPOFOL 10 MG/ML IV BOLUS
INTRAVENOUS | Status: AC
  Filled 2021-05-02: qty 20

## 2021-05-02 MED ORDER — SUCCINYLCHOLINE CHLORIDE 200 MG/10ML IV SOSY
PREFILLED_SYRINGE | INTRAVENOUS | Status: AC
  Filled 2021-05-02: qty 10

## 2021-05-02 MED ORDER — LACTATED RINGERS IV SOLN: INTRAVENOUS | Status: DC

## 2021-05-02 MED ORDER — EPHEDRINE SULFATE-NACL 50-0.9 MG/10ML-% IV SOSY
PREFILLED_SYRINGE | INTRAVENOUS | Status: DC | PRN
Start: 2021-05-02 — End: 2021-05-02
  Administered 2021-05-02: 15 mg via INTRAVENOUS
  Administered 2021-05-02: 10 mg via INTRAVENOUS

## 2021-05-02 MED ORDER — HYDROMORPHONE HCL 1 MG/ML IJ SOLN: 0.5000 mg | INTRAMUSCULAR | Status: DC | PRN

## 2021-05-02 MED ORDER — ONDANSETRON HCL 4 MG/2ML IJ SOLN
INTRAMUSCULAR | Status: AC
  Filled 2021-05-02: qty 2

## 2021-05-02 MED ORDER — DEXAMETHASONE SODIUM PHOSPHATE 10 MG/ML IJ SOLN
INTRAMUSCULAR | Status: AC
  Filled 2021-05-02: qty 1

## 2021-05-02 MED ORDER — MORPHINE SULFATE (PF) 2 MG/ML IV SOLN
2.0000 mg | INTRAVENOUS | Status: DC | PRN
  Administered 2021-05-03: 2 mg via INTRAVENOUS
  Filled 2021-05-02: qty 1

## 2021-05-02 MED ORDER — ACETAMINOPHEN 10 MG/ML IV SOLN: 1000.0000 mg | Freq: Once | INTRAVENOUS | Status: DC | PRN

## 2021-05-02 MED ORDER — MORPHINE SULFATE (PF) 4 MG/ML IV SOLN
4.0000 mg | Freq: Once | INTRAVENOUS | Status: AC
  Administered 2021-05-02: 4 mg via INTRAVENOUS
  Filled 2021-05-02: qty 1

## 2021-05-02 MED ORDER — PROPOFOL 10 MG/ML IV BOLUS
INTRAVENOUS | Status: DC | PRN
  Administered 2021-05-02: 100 mg via INTRAVENOUS

## 2021-05-02 MED ORDER — CHLORHEXIDINE GLUCONATE 0.12 % MT SOLN: 15.0000 mL | Freq: Once | OROMUCOSAL | Status: AC

## 2021-05-02 MED ORDER — SODIUM CHLORIDE 0.9 % IV SOLN: INTRAVENOUS | Status: AC

## 2021-05-02 MED ORDER — ORAL CARE MOUTH RINSE: 15.0000 mL | Freq: Once | OROMUCOSAL | Status: AC

## 2021-05-02 MED ORDER — BUPIVACAINE-EPINEPHRINE (PF) 0.25% -1:200000 IJ SOLN
INTRAMUSCULAR | Status: DC | PRN
Start: 2021-05-02 — End: 2021-05-02
  Administered 2021-05-02: 10 mL via PERINEURAL

## 2021-05-02 MED ORDER — ONDANSETRON HCL 4 MG/2ML IJ SOLN: 4.0000 mg | Freq: Four times a day (QID) | INTRAMUSCULAR | Status: DC | PRN

## 2021-05-02 MED ORDER — FENTANYL CITRATE (PF) 100 MCG/2ML IJ SOLN
INTRAMUSCULAR | Status: DC | PRN
  Administered 2021-05-02: 50 ug via INTRAVENOUS
  Administered 2021-05-02: 25 ug via INTRAVENOUS
  Administered 2021-05-02: 75 ug via INTRAVENOUS
  Administered 2021-05-02: 50 ug via INTRAVENOUS

## 2021-05-02 SURGICAL SUPPLY — 32 items
ADH SKN CLS APL DERMABOND .7 (GAUZE/BANDAGES/DRESSINGS) ×1
APL PRP STRL LF DISP 70% ISPRP (MISCELLANEOUS) ×1
BLADE CLIPPER SURG (BLADE) ×1 IMPLANT
CANISTER SUCT 3000ML PPV (MISCELLANEOUS) ×1 IMPLANT
CHLORAPREP W/TINT 26 (MISCELLANEOUS) ×2 IMPLANT
COVER SURGICAL LIGHT HANDLE (MISCELLANEOUS) ×2 IMPLANT
DERMABOND ADVANCED (GAUZE/BANDAGES/DRESSINGS) ×1
DERMABOND ADVANCED .7 DNX12 (GAUZE/BANDAGES/DRESSINGS) ×1 IMPLANT
DRAIN PENROSE 1/2X12 LTX STRL (WOUND CARE) ×1 IMPLANT
DRAPE LAPAROTOMY TRNSV 102X78 (DRAPES) ×2 IMPLANT
ELECT REM PT RETURN 9FT ADLT (ELECTROSURGICAL) ×2
ELECTRODE REM PT RTRN 9FT ADLT (ELECTROSURGICAL) ×1 IMPLANT
GLOVE SURG POLY MICRO LF SZ5.5 (GLOVE) ×2 IMPLANT
GLOVE SURG UNDER POLY LF SZ6 (GLOVE) ×2 IMPLANT
GOWN STRL REUS W/ TWL LRG LVL3 (GOWN DISPOSABLE) ×2 IMPLANT
GOWN STRL REUS W/TWL LRG LVL3 (GOWN DISPOSABLE) ×4
KIT BASIN OR (CUSTOM PROCEDURE TRAY) ×2 IMPLANT
KIT TURNOVER KIT B (KITS) ×2 IMPLANT
MESH ULTRAPRO 3X6 7.6X15CM (Mesh General) ×1 IMPLANT
NDL HYPO 25GX1X1/2 BEV (NEEDLE) ×1 IMPLANT
NEEDLE HYPO 25GX1X1/2 BEV (NEEDLE) ×2 IMPLANT
NS IRRIG 1000ML POUR BTL (IV SOLUTION) ×2 IMPLANT
PACK GENERAL/GYN (CUSTOM PROCEDURE TRAY) ×2 IMPLANT
SUT MNCRL AB 4-0 PS2 18 (SUTURE) ×2 IMPLANT
SUT PDS AB 0 CT1 27 (SUTURE) ×2 IMPLANT
SUT VIC AB 2-0 SH 27 (SUTURE) ×2
SUT VIC AB 2-0 SH 27X BRD (SUTURE) ×1 IMPLANT
SUT VIC AB 3-0 SH 27 (SUTURE) ×4
SUT VIC AB 3-0 SH 27X BRD (SUTURE) ×2 IMPLANT
SYR CONTROL 10ML LL (SYRINGE) ×2 IMPLANT
TOWEL GREEN STERILE (TOWEL DISPOSABLE) ×2 IMPLANT
TOWEL GREEN STERILE FF (TOWEL DISPOSABLE) ×2 IMPLANT

## 2021-05-02 NOTE — Anesthesia Preprocedure Evaluation (Addendum)
Anesthesia Evaluation  Patient identified by MRN, date of birth, ID band Patient awake    Reviewed: Allergy & Precautions, NPO status , Patient's Chart, lab work & pertinent test results  Airway Mallampati: II  TM Distance: >3 FB Neck ROM: Full    Dental  (+) Partial Upper   Pulmonary COPD,  COPD inhaler, Current Smoker,    Pulmonary exam normal        Cardiovascular hypertension, Pt. on medications and Pt. on home beta blockers + CAD, + Past MI and + Cardiac Stents (2015 BMS )   Rhythm:Regular Rate:Normal     Neuro/Psych CVA (2012), No Residual Symptoms negative psych ROS   GI/Hepatic Neg liver ROS, Incarcerated right inguinal hernia, cecal Ca   Endo/Other  hyperparathyroidism  Renal/GU CRFRenal disease   Prostate Ca negative genitourinary   Musculoskeletal negative musculoskeletal ROS (+)   Abdominal Normal abdominal exam  (+)   Peds  Hematology  (+) Blood dyscrasia, anemia ,   Anesthesia Other Findings   Reproductive/Obstetrics                            Anesthesia Physical Anesthesia Plan  ASA: 3  Anesthesia Plan: General   Post-op Pain Management:    Induction: Intravenous  PONV Risk Score and Plan: 1 and Ondansetron and Dexamethasone  Airway Management Planned: Mask and Oral ETT  Additional Equipment: None  Intra-op Plan:   Post-operative Plan: Extubation in OR  Informed Consent: I have reviewed the patients History and Physical, chart, labs and discussed the procedure including the risks, benefits and alternatives for the proposed anesthesia with the patient or authorized representative who has indicated his/her understanding and acceptance.     Dental advisory given  Plan Discussed with: CRNA  Anesthesia Plan Comments: (Lab Results      Component                Value               Date                      WBC                      6.7                  05/02/2021                HGB                      13.9                05/02/2021                HCT                      43.7                05/02/2021                MCV                      88.5                05/02/2021                PLT  202                 05/02/2021           Lab Results      Component                Value               Date                      NA                       135                 05/02/2021                K                        4.9                 05/02/2021                CO2                      24                  05/02/2021                GLUCOSE                  121 (H)             05/02/2021                BUN                      37 (H)              05/02/2021                CREATININE               2.39 (H)            05/02/2021                CALCIUM                  10.9 (H)            05/02/2021                EGFR                     28 (L)              04/15/2021                GFRNONAA                 25 (L)              05/02/2021            ECHO 11/19: - Left ventricle: The cavity size was normal. Wall thickness was  increased in a pattern of mild LVH. Systolic function was normal.  The estimated ejection fraction was in the range of 55% to 60%.  Wall motion was normal; there were no regional wall motion  abnormalities. Doppler parameters are consistent  with abnormal  left ventricular relaxation (grade 1 diastolic dysfunction).  Doppler parameters are consistent with high ventricular filling  pressure.  - Aortic valve: There was mild regurgitation.  - Mitral valve: Calcified annulus.   Impressions:   - Normal LV systolic function; mild diastolic dysfunction; mild  LVH; sclerotic aortic valve with mild AI. )       Anesthesia Quick Evaluation

## 2021-05-02 NOTE — ED Notes (Signed)
Pt back from CT

## 2021-05-02 NOTE — Anesthesia Procedure Notes (Signed)
Procedure Name: Intubation Date/Time: 05/02/2021 5:52 PM Performed by: Barrington Ellison, CRNA Pre-anesthesia Checklist: Patient identified, Emergency Drugs available, Suction available and Patient being monitored Patient Re-evaluated:Patient Re-evaluated prior to induction Oxygen Delivery Method: Circle System Utilized Preoxygenation: Pre-oxygenation with 100% oxygen Induction Type: IV induction Ventilation: Mask ventilation without difficulty Laryngoscope Size: Mac and 4 Grade View: Grade I Tube type: Oral Tube size: 7.5 mm Number of attempts: 1 Airway Equipment and Method: Stylet and Oral airway Placement Confirmation: ETT inserted through vocal cords under direct vision, positive ETCO2 and breath sounds checked- equal and bilateral Secured at: 21 cm Tube secured with: Tape Dental Injury: Teeth and Oropharynx as per pre-operative assessment

## 2021-05-02 NOTE — Transfer of Care (Signed)
Immediate Anesthesia Transfer of Care Note  Patient: [name]  Procedure(s) Performed: RIGHT HERNIA REPAIR INGUINAL (Right: Inguinal)  Patient Location: PACU  Anesthesia Type:General  Level of Consciousness: drowsy  Airway & Oxygen Therapy: Patient Spontanous Breathing and Patient connected to face mask oxygen  Post-op Assessment: Report given to RN and Post -op Vital signs reviewed and stable  Post vital signs: Reviewed and stable  Last Vitals:  Vitals Value Taken Time  BP 128/69 05/02/21 1937  Temp    Pulse 73 05/02/21 1938  Resp 12 05/02/21 1938  SpO2 98 % 05/02/21 1938  Vitals shown include unvalidated device data.  Last Pain:  Vitals:   05/02/21 1719  TempSrc: Oral  PainSc: 0-No pain         Complications: No notable events documented.

## 2021-05-02 NOTE — ED Triage Notes (Signed)
Patient BIB [facility] from home after Patient's CNA called 911 due to abdominal pain and nausea X3-4 days. Per EMS, CNA report Patient not taking meds as prescribed

## 2021-05-02 NOTE — ED Notes (Signed)
Pt back in room from imaging
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
## 2021-05-02 NOTE — Op Note (Signed)
Date: 05/02/21  Patient: [name] MRN: [id number]  Preoperative Diagnosis: Incarcerated right inguinal hernia Postoperative Diagnosis: Same  Procedure: Open right inguinal hernia repair with mesh patch  Surgeon: Michaelle Birks, MD  EBL: Minimal  Anesthesia: General endotracheal  Specimens: None  Indications: [name] is an 88 yo male who presented to the ED with several days of right groin pain, nausea, and vomiting.  CT scan showed a small bowel obstruction secondary to right inguinal hernia.  The hernia was not able to be reduced.  Emergent surgical repair was recommended and after an extensive discussion the patient agreed to proceed.  Findings: Right inguinal hernia with both direct and indirect components, with a thickened sac adherent to the cord structures.  Procedure details: Informed consent was obtained in the preoperative area prior to the procedure. The patient was brought to the operating room and placed on the table in the supine position.  General anesthesia was induced and appropriate lines and drains were placed for intraoperative monitoring. Perioperative antibiotics were administered per SCIP guidelines. The abdomen and groin were prepped and draped in the usual sterile fashion. A pre-procedure timeout was taken verifying patient identity, surgical site and procedure to be performed.  A transverse incision was made in the right groin two fingerbreadths superior to the pubic tubercle. The subcutaneous tissue and Scarpa's fascia were divided with cautery to expose the external oblique fascia. The fascia was nicked with a 15-blade scalpel and opened medially to the external inguinal ring using metzenbaum scissors, taking care to protect the ilioinguinal nerve.  On opening the external inguinal ring, the hernia spontaneously reduced.  The cord structures were circumferentially dissected out using gentle blunt dissection, and encircled with a penrose. The ilioinguinal nerve  was visualized and protected. An indirect hernia sac was identified and dissected off the cord structures using blunt dissection and cautery.  This was a somewhat tedious dissection, as the sac was thickened and very adherent to the cord structures extending into the scrotum.  The sac was slowly dissected off the cord structures, taking great care to protect the vas deference.  During this dissection the testicle was pulled out of the scrotum, although it appeared well perfused.  Once the sac was dissected off the cord structures, it was opened and digitally probed.  There is a small amount of serous fluid within the peritoneum but no large-volume or turbid fluid.  A high ligation of the sac was performed with a 3-0 vicryl suture. The sac was then reduced into the abdomen.  There also appeared to be a small direct hernia, which was easily reducible.  The testicle remained very well perfused and was placed back down into the scrotum, taking care not to twist the cord.  A sheet of Ultrapro mesh was then brought onto the field and cut to size, with a slit to accommodate the cord structures. The mesh was secured medially to the pubic tubercle and inferiorly to the shelving edge of the inguinal ligament, using a running 0 PDS suture. Superiorly the mesh was secured to the conjoint tendon using interrupted 2-0 PDS. The tails of the mesh were sutured together laterally to recreate the internal inguinal ring.  The ilioinguinal nerve was adherent to the tissues in the lateral inguinal canal and appeared entrapped within the mesh, and was thus transected and a small segment removed to avoid chronic inguinodynia.  The external oblique fascia was closed with a running 2-0 Vicryl suture. Scarpa's fascia was closed a running 3-0 Vicryl,  and the skin was closed with a running subcuticular 4-0 monocryl suture. Dermabond was applied.  At the completion of the procedure, the right testicle was palpable within the scrotum.  The  patient tolerated the procedure well with no apparent complications.  All counts were correct x2 at the end of the procedure. The patient was extubated and taken to PACU in stable condition.  Michaelle Birks, MD 05/02/21 10:52 PM
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
## 2021-05-02 NOTE — Anesthesia Postprocedure Evaluation (Signed)
Anesthesia Post Note  Patient: [name]  Procedure(s) Performed: RIGHT HERNIA REPAIR INGUINAL (Right: Inguinal)     Patient location during evaluation: PACU Anesthesia Type: General Level of consciousness: awake and alert Pain management: pain level controlled Vital Signs Assessment: post-procedure vital signs reviewed and stable Respiratory status: spontaneous breathing, nonlabored ventilation and respiratory function stable Cardiovascular status: stable and blood pressure returned to baseline Anesthetic complications: no   No notable events documented.  Last Vitals:  Vitals:   05/02/21 1955 05/02/21 2010  BP: 129/78 132/67  Pulse: 72 76  Resp: (!) 9 15  Temp:    SpO2: 97% 96%    Last Pain:  Vitals:   05/02/21 1955  TempSrc:   PainSc: 0-No pain                 Audry Pili

## 2021-05-03 LAB — CBC
HCT: 38.2 % — ABNORMAL LOW (ref 39.0–52.0)
Hemoglobin: 12.3 g/dL — ABNORMAL LOW (ref 13.0–17.0)
MCH: 28.4 pg (ref 26.0–34.0)
MCHC: 32.2 g/dL (ref 30.0–36.0)
MCV: 88.2 fL (ref 80.0–100.0)
Platelets: 198 10*3/uL (ref 150–400)
RBC: 4.33 MIL/uL (ref 4.22–5.81)
RDW: 13.9 % (ref 11.5–15.5)
WBC: 13.8 10*3/uL — ABNORMAL HIGH (ref 4.0–10.5)
nRBC: 0 % (ref 0.0–0.2)

## 2021-05-03 LAB — BASIC METABOLIC PANEL
Anion gap: 10 (ref 5–15)
BUN: 37 mg/dL — ABNORMAL HIGH (ref 8–23)
CO2: 19 mmol/L — ABNORMAL LOW (ref 22–32)
Calcium: 10.2 mg/dL (ref 8.9–10.3)
Chloride: 108 mmol/L (ref 98–111)
Creatinine, Ser: 2.26 mg/dL — ABNORMAL HIGH (ref 0.61–1.24)
GFR, Estimated: 27 mL/min — ABNORMAL LOW (ref >60)
Glucose, Bld: 138 mg/dL — ABNORMAL HIGH (ref 70–99)
Potassium: 5.2 mmol/L — ABNORMAL HIGH (ref 3.5–5.1)
Sodium: 137 mmol/L (ref 135–145)

## 2021-05-03 MED ORDER — MORPHINE SULFATE (PF) 2 MG/ML IV SOLN: 2.0000 mg | INTRAVENOUS | Status: DC | PRN

## 2021-05-03 MED ORDER — ACETAMINOPHEN 10 MG/ML IV SOLN
1000.0000 mg | Freq: Four times a day (QID) | INTRAVENOUS | Status: AC
  Administered 2021-05-03 – 2021-05-04 (×4): 1000 mg via INTRAVENOUS
  Filled 2021-05-03 (×4): qty 100

## 2021-05-03 MED ORDER — METHOCARBAMOL 500 MG PO TABS
500.0000 mg | ORAL_TABLET | Freq: Four times a day (QID) | ORAL | Status: DC | PRN
Start: 2021-05-03 — End: 2021-05-12
  Filled 2021-05-03: qty 1

## 2021-05-03 NOTE — Progress Notes (Signed)
NG tube out pt refuses to have it placed back.MD informed

## 2021-05-03 NOTE — Progress Notes (Signed)
Chief Complaint: Belly hurts  HPI: [name] is a 88 y.o. male with medical history significant of HTN, CKD stage IIIb, cecal colon CA status post right hemicolectomy, CAD s/p stenting, Stroke, hyperparathyroidism, came with worsening of right groin pain.  Patient has a chronic right inguinal hernia but always reducible.  Patient started to feel right groin pain about 3 days ago gradually getting worse, with cramping-like intermittent but became constant this morning associated with nausea but no vomiting.  Constipated since yesterday.  ED Course: Afebrile, no tachycardia no hypotension.  Physical exam showed nonreducible right inguinal hernia.  CT abdomen pelvis showed incarcerated right inguinal hernia with small bowel trapped inside the hernia sac.  Subjective Feels okay wants to eat and drink   Physical Exam: Vitals:   05/02/21 2046 05/03/21 0004 05/03/21 0523 05/03/21 1012  BP: (!) 134/53 136/62 138/65 134/70  Pulse:  (!) 101 98 76  Resp:  19 15 16   Temp: 97.7 F (36.5 C) 98.3 F (36.8 C) 98 F (36.7 C) 98.4 F (36.9 C)  TempSrc: Oral Oral Oral Oral  SpO2:  97% 96% 96%    Constitutional: NAD, calm, comfortable Vitals:   05/02/21 2046 05/03/21 0004 05/03/21 0523 05/03/21 1012  BP: (!) 134/53 136/62 138/65 134/70  Pulse:  (!) 101 98 76  Resp:  19 15 16   Temp: 97.7 F (36.5 C) 98.3 F (36.8 C) 98 F (36.7 C) 98.4 F (36.9 C)  TempSrc: Oral Oral Oral Oral  SpO2:  97% 96% 96%   Eyes: PERRL, lids and conjunctivae normal ENMT: Mucous membranes are dry. Posterior pharynx clear of any exudate or lesions.Normal dentition.  Neck: normal, supple, no masses, no thyromegaly Respiratory: clear to auscultation bilaterally, no wheezing, no crackles. Normal respiratory effort. No accessory muscle use.  Cardiovascular: Regular rate and rhythm, no murmurs / rubs / gallops. No extremity edema. 2+ pedal pulses. No carotid bruits.  Abdomen: tenderness RLQ right above the hernia  neck, Reduced Bowel Sounds inside the Hernia Sac, No hepatosplenomegaly. Bowel sounds positive.  Musculoskeletal: no clubbing / cyanosis. No joint deformity upper and lower extremities. Good ROM, no contractures. Normal muscle tone.  Skin: no rashes, lesions, ulcers. No induration Neurologic: CN 2-12 grossly intact. Sensation intact, DTR normal. Strength 5/5 in all 4.  Psychiatric: Normal judgment and insight. Alert and oriented x 3. Normal mood.    Labs on Admission: I have personally reviewed following labs and imaging studies  CBC: Recent Labs  Lab 05/02/21 1234 05/03/21 0227  WBC 6.7 13.8*  NEUTROABS 4.8  --   HGB 13.9 12.3*  HCT 43.7 38.2*  MCV 88.5 88.2  PLT 202 161   Basic Metabolic Panel: Recent Labs  Lab 05/02/21 1234 05/03/21 0227  NA 135 137  K 4.9 5.2*  CL 104 108  CO2 24 19*  GLUCOSE 121* 138*  BUN 37* 37*  CREATININE 2.39* 2.26*  CALCIUM 10.9* 10.2   GFR: CrCl cannot be calculated (Unknown ideal weight.). Liver Function Tests: Recent Labs  Lab 05/02/21 1234  AST 17  ALT 9  ALKPHOS 79  BILITOT 0.6  PROT 8.1  ALBUMIN 3.2*   Recent Labs  Lab 05/02/21 1234  LIPASE 29   No results for input(s): AMMONIA in the last 168 hours. Coagulation Profile: No results for input(s): INR, PROTIME in the last 168 hours. Cardiac Enzymes: No results for input(s): CKTOTAL, CKMB, CKMBINDEX, TROPONINI in the last 168 hours. BNP (last 3 results) No results for input(s): PROBNP in the last  8760 hours. HbA1C: No results for input(s): HGBA1C in the last 72 hours. CBG: No results for input(s): GLUCAP in the last 168 hours. Lipid Profile: No results for input(s): CHOL, HDL, LDLCALC, TRIG, CHOLHDL, LDLDIRECT in the last 72 hours. Thyroid Function Tests: No results for input(s): TSH, T4TOTAL, FREET4, T3FREE, THYROIDAB in the last 72 hours. Anemia Panel: No results for input(s): VITAMINB12, FOLATE, FERRITIN, TIBC, IRON, RETICCTPCT in the last 72 hours. Urine  analysis:    Component Value Date/Time   COLORURINE YELLOW 02/11/2014 1106   APPEARANCEUR CLEAR 02/11/2014 1106   LABSPEC 1.024 02/11/2014 1106   PHURINE 5.0 02/11/2014 1106   GLUCOSEU NEGATIVE 02/11/2014 1106   HGBUR NEGATIVE 02/11/2014 1106   BILIRUBINUR NEGATIVE 02/11/2014 1106   KETONESUR NEGATIVE 02/11/2014 1106   PROTEINUR 100 (A) 02/11/2014 1106   UROBILINOGEN 1.0 02/11/2014 1106   NITRITE NEGATIVE 02/11/2014 1106   LEUKOCYTESUR NEGATIVE 02/11/2014 1106    Radiological Exams on Admission: CT ABDOMEN PELVIS WO CONTRAST  Result Date: 05/02/2021 CLINICAL DATA:  Abdominal pain.  Scrotal swelling, dialysis patient. EXAM: CT ABDOMEN AND PELVIS WITHOUT CONTRAST TECHNIQUE: Multidetector CT imaging of the abdomen and pelvis was performed following the standard protocol without IV contrast. RADIATION DOSE REDUCTION: This exam was performed according to the departmental dose-optimization program which includes automated exposure control, adjustment of the mA and/or kV according to patient size and/or use of iterative reconstruction technique. COMPARISON:  04/28/2017 FINDINGS: Lower chest: Right basilar atelectasis. No acute abnormality. Coronary artery atherosclerosis. Hepatobiliary: No focal liver abnormality is seen. Prior cholecystectomy. Pancreas: Unremarkable. No pancreatic ductal dilatation or surrounding inflammatory changes. Spleen: Normal in size without focal abnormality. Adrenals/Urinary Tract: Adrenal glands are unremarkable. 18 mm hypodense, fluid attenuating left renal mass consistent with a cyst. No urolithiasis or obstructive uropathy. Normal bladder. Stomach/Bowel: Gastric air-fluid level. Numerous dilated loops of small bowel which are fluid-filled measuring up to 2.9 cm. Right inguinal hernia containing a loop of small bowel with small bowel feces and a small amount of fluid adjacent to the small bowel. Vascular/Lymphatic: Normal caliber abdominal aorta with mild atherosclerosis.  No lymphadenopathy. Reproductive: Prostate is unremarkable. Other: No abdominal or pelvic ascites. Small fat containing left inguinal hernia. Musculoskeletal: No acute osseous abnormality. No aggressive osseous lesion. Degenerative disease with disc height loss at L3-4, L4-5 and L5-S1 with reactive endplate changes and Schmorl's nodes. Bilateral facet arthropathy throughout the lumbar spine. Chronic T11 vertebral body compression fracture with a Schmorl's node along the superior endplate. IMPRESSION: 1. Small bowel obstruction with a transition point resulting from a right inguinal hernia containing a loop of small bowel. Feces within the small bowel loops within the right inguinal hernia and a small amount of perienteric fluid concerning for incarceration. 2. Aortic Atherosclerosis (ICD10-I70.0). Electronically Signed   By: Kathreen Devoid M.D.   On: 05/02/2021 13:31   DG Abd Portable 1V  Result Date: 05/02/2021 CLINICAL DATA:  Nasogastric tube placement EXAM: PORTABLE ABDOMEN - 1 VIEW COMPARISON:  None. FINDINGS: Nasogastric tube tip is seen overlying the gastric fundus. Normal abdominal gas pattern. Cholecystectomy clips in the right upper quadrant. IMPRESSION: Nasogastric tube tip overlying the gastric fundus. Electronically Signed   By: Fidela Salisbury M.D.   On: 05/02/2021 20:09   US SCROTUM W/DOPPLER  Result Date: 05/02/2021 CLINICAL DATA:  Testicular pain EXAM: SCROTAL ULTRASOUND DOPPLER ULTRASOUND OF THE TESTICLES TECHNIQUE: Complete ultrasound examination of the testicles, epididymis, and other scrotal structures was performed. Color and spectral Doppler ultrasound were also utilized to evaluate blood  flow to the testicles. COMPARISON:  CT examination dated May 02, 2021 FINDINGS: Right testicle Measurements: 2.8 x 1.5 x 2.0. Heterogeneous parenchyma. No mass or microlithiasis visualized. Left testicle Measurements: 4.1 x 2.9 x 2.6. No mass or microlithiasis visualized. Right epididymis:   Epididymal cyst measuring up to 0.5 cm. Left epididymis:  Normal in size and appearance. Hydrocele:  Bilateral hydroceles, left greater than the right Varicocele:  None visualized. Pulsed Doppler interrogation of both testes demonstrates normal low resistance arterial and venous waveforms bilaterally. Others: Skin thickening with large right inguinal hernia containing echogenic bowel loops concerning for incarcerated hernia. IMPRESSION: 1. Scrotal skin thickening with edematous bowel loop in the scrotal sac concerning for incarcerated hernia. 2.  Normal blood flow to the bilateral testes. 3. Heterogeneous echotexture of the right testis, which could be reactive changes in the presence of incarcerated right inguinal hernia. 4.  Bilateral hydrocele, left greater than the right. Electronically Signed   By: Keane Police D.O.   On: 05/02/2021 14:52    EKG: Independently reviewed. Sinus, LVH, no acute ST-T changes.  Assessment/Plan Principal Problem:   Incarcerated right inguinal hernia Active Problems:   Incarcerated inguinal hernia   Incarcerated right inguinal hernia -Status post surgical repair 05/02/2021 -Defer diet per general surgery recommendations  HTN -Hold all home BP meds -PRN Hydralazine for now.  CAD -No acute issue  CKD stage IIIb -Signs of dehydration, start IV fluid.  Chronic hypercalcemia and hyperparathyroidism -Calcium level appears to be stable compared to baseline -Monitor level, if remains stable, likely can follow-up with PCP/endocrinology outpatient.  Out of bed, PT eval  Dereck Agerton A MD  05/03/2021, 11:22 AM

## 2021-05-03 NOTE — Progress Notes (Signed)
Pt car keys bankcard given to pt daughter Sampson Si [phone]

## 2021-05-03 NOTE — Progress Notes (Signed)
Patient ID: [name], male   DOB: 12-20-1930, 88 y.o.   MRN: [id number] [facility] Surgery Progress Note  1 Day Post-Op  Subjective: CC-  Pulled NG out over night, refusing replacement. No flatus or BM. Has not gotten OOB since surgery. Complaining of some mild abdominal discomfort. Denies n/v.  Objective: Vital signs in last 24 hours: Temp:  [97.4 F (36.3 C)-98.3 F (36.8 C)] 98 F (36.7 C) (02/25 0523) Pulse Rate:  [59-101] 98 (02/25 0523) Resp:  [9-19] 15 (02/25 0523) BP: (128-164)/(52-78) 138/65 (02/25 0523) SpO2:  [96 %-98 %] 96 % (02/25 0523)    Intake/Output from previous day: 02/24 0701 - 02/25 0700 In: 500 [I.V.:500] Out: 840 [Urine:350; Emesis/NG output:270; Blood:20] Intake/Output this shift: No intake/output data recorded.  PE: Gen:  Alert, NAD, pleasant Pulm:  rate and effort normal on room air Abd: Soft, NT/ND, hypoactive BS, right groin incision cdi Psych: A&Ox3 Skin: no rashes noted, warm and dry  Lab Results:  Recent Labs    05/02/21 1234 05/03/21 0227  WBC 6.7 13.8*  HGB 13.9 12.3*  HCT 43.7 38.2*  PLT 202 198   BMET Recent Labs    05/02/21 1234 05/03/21 0227  NA 135 137  K 4.9 5.2*  CL 104 108  CO2 24 19*  GLUCOSE 121* 138*  BUN 37* 37*  CREATININE 2.39* 2.26*  CALCIUM 10.9* 10.2   PT/INR No results for input(s): LABPROT, INR in the last 72 hours. CMP     Component Value Date/Time   NA 137 05/03/2021 0227   NA 140 04/15/2021 1138   K 5.2 (H) 05/03/2021 0227   CL 108 05/03/2021 0227   CO2 19 (L) 05/03/2021 0227   GLUCOSE 138 (H) 05/03/2021 0227   BUN 37 (H) 05/03/2021 0227   BUN 34 (H) 04/15/2021 1138   CREATININE 2.26 (H) 05/03/2021 0227   CALCIUM 10.2 05/03/2021 0227   PROT 8.1 05/02/2021 1234   PROT 8.6 (H) 04/15/2021 1138   ALBUMIN 3.2 (L) 05/02/2021 1234   ALBUMIN 4.1 04/15/2021 1138   AST 17 05/02/2021 1234   ALT 9 05/02/2021 1234   ALKPHOS 79 05/02/2021 1234   BILITOT 0.6 05/02/2021 1234   BILITOT 0.6  04/15/2021 1138   GFRNONAA 27 (L) 05/03/2021 0227   GFRAA 27 (L) 02/16/2020 1005   Lipase     Component Value Date/Time   LIPASE 29 05/02/2021 1234       Studies/Results: CT ABDOMEN PELVIS WO CONTRAST  Result Date: 05/02/2021 CLINICAL DATA:  Abdominal pain.  Scrotal swelling, dialysis patient. EXAM: CT ABDOMEN AND PELVIS WITHOUT CONTRAST TECHNIQUE: Multidetector CT imaging of the abdomen and pelvis was performed following the standard protocol without IV contrast. RADIATION DOSE REDUCTION: This exam was performed according to the departmental dose-optimization program which includes automated exposure control, adjustment of the mA and/or kV according to patient size and/or use of iterative reconstruction technique. COMPARISON:  04/28/2017 FINDINGS: Lower chest: Right basilar atelectasis. No acute abnormality. Coronary artery atherosclerosis. Hepatobiliary: No focal liver abnormality is seen. Prior cholecystectomy. Pancreas: Unremarkable. No pancreatic ductal dilatation or surrounding inflammatory changes. Spleen: Normal in size without focal abnormality. Adrenals/Urinary Tract: Adrenal glands are unremarkable. 18 mm hypodense, fluid attenuating left renal mass consistent with a cyst. No urolithiasis or obstructive uropathy. Normal bladder. Stomach/Bowel: Gastric air-fluid level. Numerous dilated loops of small bowel which are fluid-filled measuring up to 2.9 cm. Right inguinal hernia containing a loop of small bowel with small bowel feces and a small amount of  fluid adjacent to the small bowel. Vascular/Lymphatic: Normal caliber abdominal aorta with mild atherosclerosis. No lymphadenopathy. Reproductive: Prostate is unremarkable. Other: No abdominal or pelvic ascites. Small fat containing left inguinal hernia. Musculoskeletal: No acute osseous abnormality. No aggressive osseous lesion. Degenerative disease with disc height loss at L3-4, L4-5 and L5-S1 with reactive endplate changes and Schmorl's  nodes. Bilateral facet arthropathy throughout the lumbar spine. Chronic T11 vertebral body compression fracture with a Schmorl's node along the superior endplate. IMPRESSION: 1. Small bowel obstruction with a transition point resulting from a right inguinal hernia containing a loop of small bowel. Feces within the small bowel loops within the right inguinal hernia and a small amount of perienteric fluid concerning for incarceration. 2. Aortic Atherosclerosis (ICD10-I70.0). Electronically Signed   By: Kathreen Devoid M.D.   On: 05/02/2021 13:31   DG Abd Portable 1V  Result Date: 05/02/2021 CLINICAL DATA:  Nasogastric tube placement EXAM: PORTABLE ABDOMEN - 1 VIEW COMPARISON:  None. FINDINGS: Nasogastric tube tip is seen overlying the gastric fundus. Normal abdominal gas pattern. Cholecystectomy clips in the right upper quadrant. IMPRESSION: Nasogastric tube tip overlying the gastric fundus. Electronically Signed   By: Fidela Salisbury M.D.   On: 05/02/2021 20:09   US SCROTUM W/DOPPLER  Result Date: 05/02/2021 CLINICAL DATA:  Testicular pain EXAM: SCROTAL ULTRASOUND DOPPLER ULTRASOUND OF THE TESTICLES TECHNIQUE: Complete ultrasound examination of the testicles, epididymis, and other scrotal structures was performed. Color and spectral Doppler ultrasound were also utilized to evaluate blood flow to the testicles. COMPARISON:  CT examination dated May 02, 2021 FINDINGS: Right testicle Measurements: 2.8 x 1.5 x 2.0. Heterogeneous parenchyma. No mass or microlithiasis visualized. Left testicle Measurements: 4.1 x 2.9 x 2.6. No mass or microlithiasis visualized. Right epididymis:  Epididymal cyst measuring up to 0.5 cm. Left epididymis:  Normal in size and appearance. Hydrocele:  Bilateral hydroceles, left greater than the right Varicocele:  None visualized. Pulsed Doppler interrogation of both testes demonstrates normal low resistance arterial and venous waveforms bilaterally. Others: Skin thickening with large  right inguinal hernia containing echogenic bowel loops concerning for incarcerated hernia. IMPRESSION: 1. Scrotal skin thickening with edematous bowel loop in the scrotal sac concerning for incarcerated hernia. 2.  Normal blood flow to the bilateral testes. 3. Heterogeneous echotexture of the right testis, which could be reactive changes in the presence of incarcerated right inguinal hernia. 4.  Bilateral hydrocele, left greater than the right. Electronically Signed   By: Keane Police D.O.   On: 05/02/2021 14:52    Anti-infectives: Anti-infectives (From admission, onward)    Start     Dose/Rate Route Frequency Ordered Stop   05/03/21 0600  cefTRIAXone (ROCEPHIN) 2 g in sodium chloride 0.9 % 100 mL IVPB        2 g 200 mL/hr over 30 Minutes Intravenous On call to O.R. 05/02/21 1642 05/02/21 1755        Assessment/Plan Incarcerated right inguinal hernia  S/p Open right inguinal hernia repair with mesh patch 2/24 Dr. Zenia Resides - Pulled NG out, ok to leave out unless patient vomits - Ok for sips of clear liquids, await return in bowel function - Mobilize, will ask PT/OT to see - IV tylenol for improved pain control, limit narcotics   ID - rocephin periop FEN - IVF, sips of clears VTE - sq heparin Foley - none  HTN CAD CKD-IIIb Chronic hypercalcemia and hyperparathyroidism   LOS: 1 day    Wellington Hampshire, [facility] Surgery 05/03/2021, 9:13 AM Please  see Amion for pager number during day hours 7:00am-4:30pm

## 2021-05-04 LAB — BASIC METABOLIC PANEL
Anion gap: 6 (ref 5–15)
BUN: 41 mg/dL — ABNORMAL HIGH (ref 8–23)
CO2: 21 mmol/L — ABNORMAL LOW (ref 22–32)
Calcium: 9.5 mg/dL (ref 8.9–10.3)
Chloride: 106 mmol/L (ref 98–111)
Creatinine, Ser: 2.44 mg/dL — ABNORMAL HIGH (ref 0.61–1.24)
GFR, Estimated: 25 mL/min — ABNORMAL LOW (ref >60)
Glucose, Bld: 148 mg/dL — ABNORMAL HIGH (ref 70–99)
Potassium: 4.5 mmol/L (ref 3.5–5.1)
Sodium: 133 mmol/L — ABNORMAL LOW (ref 135–145)

## 2021-05-04 LAB — CBC
HCT: 30.9 % — ABNORMAL LOW (ref 39.0–52.0)
Hemoglobin: 9.7 g/dL — ABNORMAL LOW (ref 13.0–17.0)
MCH: 27.9 pg (ref 26.0–34.0)
MCHC: 31.4 g/dL (ref 30.0–36.0)
MCV: 88.8 fL (ref 80.0–100.0)
Platelets: 145 10*3/uL — ABNORMAL LOW (ref 150–400)
RBC: 3.48 MIL/uL — ABNORMAL LOW (ref 4.22–5.81)
RDW: 14.2 % (ref 11.5–15.5)
WBC: 7.5 10*3/uL (ref 4.0–10.5)
nRBC: 0 % (ref 0.0–0.2)

## 2021-05-04 MED ORDER — OXYCODONE HCL 5 MG PO TABS
2.5000 mg | ORAL_TABLET | ORAL | Status: DC | PRN
  Administered 2021-05-04 – 2021-05-09 (×7): 5 mg via ORAL
  Filled 2021-05-04 (×9): qty 1

## 2021-05-04 MED ORDER — BOOST / RESOURCE BREEZE PO LIQD CUSTOM
1.0000 | Freq: Two times a day (BID) | ORAL | Status: DC
  Administered 2021-05-04 – 2021-05-12 (×13): 1 via ORAL

## 2021-05-04 MED ORDER — ACETAMINOPHEN 325 MG PO TABS
650.0000 mg | ORAL_TABLET | Freq: Four times a day (QID) | ORAL | Status: DC
  Administered 2021-05-04 – 2021-05-12 (×28): 650 mg via ORAL
  Filled 2021-05-04 (×30): qty 2

## 2021-05-04 MED ORDER — MORPHINE SULFATE (PF) 2 MG/ML IV SOLN: 2.0000 mg | Freq: Four times a day (QID) | INTRAVENOUS | Status: DC | PRN

## 2021-05-04 NOTE — Evaluation (Signed)
Physical Therapy Evaluation Patient Details Name: [name] MRN: [id number] DOB: Jun 12, 1930 Today's Date: 05/04/2021  History of Present Illness  88 y.o. male presents to [facility] hospital on 05/02/2021 with R groin pain. CT abdomen pelvis showed incarcerated right inguinal hernia with small bowel trapped inside the hernia sac. Pt underwent open R inguinal hernia repair on 2/24. PMH includes HTN, CKD stage IIIb, cecal colon CA status post right hemicolectomy, CAD s/p stenting, Stroke, hyperparathyroidism.  Clinical Impression  Pt presents to PT with deficits in activity tolerance, power, gait, balance, endurance, functional mobility. Pt requires increased time to perform bed mobility as well as assistance to transition from standing to sitting due to discomfort in groin area. Pt will benefit from frequent mobilization to aide in improving activity tolerance. PT will attempt gait training with 4 wheeled walker next session as this is what pt utilizes at baseline.       Recommendations for follow up therapy are one component of a multi-disciplinary discharge planning process, led by the attending physician.  Recommendations may be updated based on patient status, additional functional criteria and insurance authorization.  Follow Up Recommendations No PT follow up    Assistance Recommended at Discharge PRN  Patient can return home with the following  A little help with walking and/or transfers;A little help with bathing/dressing/bathroom;Assistance with cooking/housework;Assist for transportation    Equipment Recommendations None recommended by PT (pt owns necessary DME)  Recommendations for Other Services       Functional Status Assessment Patient has had a recent decline in their functional status and demonstrates the ability to make significant improvements in function in a reasonable and predictable amount of time.     Precautions / Restrictions Precautions Precautions:  Fall Restrictions Weight Bearing Restrictions: No      Mobility  Bed Mobility Overal bed mobility: Needs Assistance Bed Mobility: Supine to Sit     Supine to sit: Supervision          Transfers Overall transfer level: Needs assistance Equipment used: Rolling walker (2 wheels) Transfers: Sit to/from Stand Sit to Stand: Supervision           General transfer comment: verbal cues for hand placement. MinA stand to sit, appears to have discomfort with hip flexion    Ambulation/Gait Ambulation/Gait assistance: Supervision Gait Distance (Feet): 100 Feet Assistive device: Rolling walker (2 wheels) Gait Pattern/deviations: Step-through pattern Gait velocity: reduced Gait velocity interpretation: <1.8 ft/sec, indicate of risk for recurrent falls   General Gait Details: pt with slowed step-through gait  Stairs            Wheelchair Mobility    Modified Rankin (Stroke Patients Only)       Balance Overall balance assessment: Needs assistance Sitting-balance support: No upper extremity supported, Feet supported Sitting balance-Leahy Scale: Good     Standing balance support: Single extremity supported, Bilateral upper extremity supported, Reliant on assistive device for balance Standing balance-Leahy Scale: Poor                               Pertinent Vitals/Pain Pain Assessment Pain Assessment: No/denies pain    Home Living Family/patient expects to be discharged to:: Private residence Living Arrangements: Spouse/significant other Available Help at Discharge: Family;Available PRN/intermittently (spouse 24/7 but cannot assist per patient. Daughter can assist PRN) Type of Home: House Home Access: Level entry       Home Layout: Two level;Able to live on main level with  bedroom/bathroom Home Equipment: Rollator (4 wheels);Wheelchair - power;Shower seat      Prior Function Prior Level of Function : Independent/Modified Independent              Mobility Comments: assists with       Hand Dominance        Extremity/Trunk Assessment   Upper Extremity Assessment Upper Extremity Assessment: Overall WFL for tasks assessed    Lower Extremity Assessment Lower Extremity Assessment: Generalized weakness    Cervical / Trunk Assessment Cervical / Trunk Assessment: Kyphotic  Communication   Communication: No difficulties  Cognition Arousal/Alertness: Awake/alert Behavior During Therapy: WFL for tasks assessed/performed Overall Cognitive Status: Within Functional Limits for tasks assessed                                          General Comments General comments (skin integrity, edema, etc.): VSS on RA    Exercises     Assessment/Plan    PT Assessment Patient needs continued PT services  PT Problem List Decreased strength;Decreased activity tolerance;Decreased balance;Decreased mobility;Decreased knowledge of use of DME;Pain       PT Treatment Interventions DME instruction;Gait training;Functional mobility training;Therapeutic activities;Therapeutic exercise;Balance training;Neuromuscular re-education;Patient/family education    PT Goals (Current goals can be found in the Care Plan section)  Acute Rehab PT Goals Patient Stated Goal: to go home PT Goal Formulation: With patient Time For Goal Achievement: 05/18/21 Potential to Achieve Goals: Good    Frequency Min 3X/week     Co-evaluation               AM-PAC PT "6 Clicks" Mobility  Outcome Measure Help needed turning from your back to your side while in a flat bed without using bedrails?: A Little Help needed moving from lying on your back to sitting on the side of a flat bed without using bedrails?: A Little Help needed moving to and from a bed to a chair (including a wheelchair)?: A Little Help needed standing up from a chair using your arms (e.g., wheelchair or bedside chair)?: A Little Help needed to walk in hospital room?: A  Little Help needed climbing 3-5 steps with a railing? : Total 6 Click Score: 16    End of Session   Activity Tolerance: Patient tolerated treatment well Patient left: in chair;with call bell/phone within reach;with chair alarm set Nurse Communication: Mobility status PT Visit Diagnosis: Other abnormalities of gait and mobility (R26.89);Muscle weakness (generalized) (M62.81)    Time: 6606-0045 PT Time Calculation (min) (ACUTE ONLY): 23 min   Charges:   PT Evaluation $PT Eval Low Complexity: [place], PT, DPT Acute Rehabilitation Pager: [phone] Office [phone]   Zenaida Niece 05/04/2021, 1:25 PM

## 2021-05-04 NOTE — Progress Notes (Signed)
Patient ID: [name], male   DOB: June 27, 1930, 88 y.o.   MRN: [id number] [facility] Surgery Progress Note  2 Days Post-Op  Subjective: CC-  No complaints. Denies any pain. Denies n/v. No flatus or BM. Tolerating sips of clears. Got up to the chair yesterday.  Objective: Vital signs in last 24 hours: Temp:  [98 F (36.7 C)-98.6 F (37 C)] 98 F (36.7 C) (02/26 0418) Pulse Rate:  [38-76] 66 (02/26 0418) Resp:  [16-18] 17 (02/26 0418) BP: (134-161)/(48-70) 161/67 (02/26 0418) SpO2:  [96 %-100 %] 100 % (02/26 0418)    Intake/Output from previous day: 02/25 0701 - 02/26 0700 In: 785 [P.O.:100; I.V.:385; IV Piggyback:300] Out: 550 [Urine:550] Intake/Output this shift: Total I/O In: -  Out: 250 [Urine:250]  PE: Gen:  Alert, NAD, pleasant Pulm:  rate and effort normal on room air Abd: Soft, NT/ND, few BS heard, right groin incision cdi Psych: A&Ox3 Skin: no rashes noted, warm and dry  Lab Results:  Recent Labs    05/02/21 1234 05/03/21 0227  WBC 6.7 13.8*  HGB 13.9 12.3*  HCT 43.7 38.2*  PLT 202 198   BMET Recent Labs    05/02/21 1234 05/03/21 0227  NA 135 137  K 4.9 5.2*  CL 104 108  CO2 24 19*  GLUCOSE 121* 138*  BUN 37* 37*  CREATININE 2.39* 2.26*  CALCIUM 10.9* 10.2   PT/INR No results for input(s): LABPROT, INR in the last 72 hours. CMP     Component Value Date/Time   NA 137 05/03/2021 0227   NA 140 04/15/2021 1138   K 5.2 (H) 05/03/2021 0227   CL 108 05/03/2021 0227   CO2 19 (L) 05/03/2021 0227   GLUCOSE 138 (H) 05/03/2021 0227   BUN 37 (H) 05/03/2021 0227   BUN 34 (H) 04/15/2021 1138   CREATININE 2.26 (H) 05/03/2021 0227   CALCIUM 10.2 05/03/2021 0227   PROT 8.1 05/02/2021 1234   PROT 8.6 (H) 04/15/2021 1138   ALBUMIN 3.2 (L) 05/02/2021 1234   ALBUMIN 4.1 04/15/2021 1138   AST 17 05/02/2021 1234   ALT 9 05/02/2021 1234   ALKPHOS 79 05/02/2021 1234   BILITOT 0.6 05/02/2021 1234   BILITOT 0.6 04/15/2021 1138   GFRNONAA 27 (L)  05/03/2021 0227   GFRAA 27 (L) 02/16/2020 1005   Lipase     Component Value Date/Time   LIPASE 29 05/02/2021 1234       Studies/Results: CT ABDOMEN PELVIS WO CONTRAST  Result Date: 05/02/2021 CLINICAL DATA:  Abdominal pain.  Scrotal swelling, dialysis patient. EXAM: CT ABDOMEN AND PELVIS WITHOUT CONTRAST TECHNIQUE: Multidetector CT imaging of the abdomen and pelvis was performed following the standard protocol without IV contrast. RADIATION DOSE REDUCTION: This exam was performed according to the departmental dose-optimization program which includes automated exposure control, adjustment of the mA and/or kV according to patient size and/or use of iterative reconstruction technique. COMPARISON:  04/28/2017 FINDINGS: Lower chest: Right basilar atelectasis. No acute abnormality. Coronary artery atherosclerosis. Hepatobiliary: No focal liver abnormality is seen. Prior cholecystectomy. Pancreas: Unremarkable. No pancreatic ductal dilatation or surrounding inflammatory changes. Spleen: Normal in size without focal abnormality. Adrenals/Urinary Tract: Adrenal glands are unremarkable. 18 mm hypodense, fluid attenuating left renal mass consistent with a cyst. No urolithiasis or obstructive uropathy. Normal bladder. Stomach/Bowel: Gastric air-fluid level. Numerous dilated loops of small bowel which are fluid-filled measuring up to 2.9 cm. Right inguinal hernia containing a loop of small bowel with small bowel feces and a small amount  of fluid adjacent to the small bowel. Vascular/Lymphatic: Normal caliber abdominal aorta with mild atherosclerosis. No lymphadenopathy. Reproductive: Prostate is unremarkable. Other: No abdominal or pelvic ascites. Small fat containing left inguinal hernia. Musculoskeletal: No acute osseous abnormality. No aggressive osseous lesion. Degenerative disease with disc height loss at L3-4, L4-5 and L5-S1 with reactive endplate changes and Schmorl's nodes. Bilateral facet arthropathy  throughout the lumbar spine. Chronic T11 vertebral body compression fracture with a Schmorl's node along the superior endplate. IMPRESSION: 1. Small bowel obstruction with a transition point resulting from a right inguinal hernia containing a loop of small bowel. Feces within the small bowel loops within the right inguinal hernia and a small amount of perienteric fluid concerning for incarceration. 2. Aortic Atherosclerosis (ICD10-I70.0). Electronically Signed   By: Kathreen Devoid M.D.   On: 05/02/2021 13:31   DG Abd Portable 1V  Result Date: 05/02/2021 CLINICAL DATA:  Nasogastric tube placement EXAM: PORTABLE ABDOMEN - 1 VIEW COMPARISON:  None. FINDINGS: Nasogastric tube tip is seen overlying the gastric fundus. Normal abdominal gas pattern. Cholecystectomy clips in the right upper quadrant. IMPRESSION: Nasogastric tube tip overlying the gastric fundus. Electronically Signed   By: Fidela Salisbury M.D.   On: 05/02/2021 20:09   US SCROTUM W/DOPPLER  Result Date: 05/02/2021 CLINICAL DATA:  Testicular pain EXAM: SCROTAL ULTRASOUND DOPPLER ULTRASOUND OF THE TESTICLES TECHNIQUE: Complete ultrasound examination of the testicles, epididymis, and other scrotal structures was performed. Color and spectral Doppler ultrasound were also utilized to evaluate blood flow to the testicles. COMPARISON:  CT examination dated May 02, 2021 FINDINGS: Right testicle Measurements: 2.8 x 1.5 x 2.0. Heterogeneous parenchyma. No mass or microlithiasis visualized. Left testicle Measurements: 4.1 x 2.9 x 2.6. No mass or microlithiasis visualized. Right epididymis:  Epididymal cyst measuring up to 0.5 cm. Left epididymis:  Normal in size and appearance. Hydrocele:  Bilateral hydroceles, left greater than the right Varicocele:  None visualized. Pulsed Doppler interrogation of both testes demonstrates normal low resistance arterial and venous waveforms bilaterally. Others: Skin thickening with large right inguinal hernia containing  echogenic bowel loops concerning for incarcerated hernia. IMPRESSION: 1. Scrotal skin thickening with edematous bowel loop in the scrotal sac concerning for incarcerated hernia. 2.  Normal blood flow to the bilateral testes. 3. Heterogeneous echotexture of the right testis, which could be reactive changes in the presence of incarcerated right inguinal hernia. 4.  Bilateral hydrocele, left greater than the right. Electronically Signed   By: Keane Police D.O.   On: 05/02/2021 14:52    Anti-infectives: Anti-infectives (From admission, onward)    Start     Dose/Rate Route Frequency Ordered Stop   05/03/21 0600  cefTRIAXone (ROCEPHIN) 2 g in sodium chloride 0.9 % 100 mL IVPB        2 g 200 mL/hr over 30 Minutes Intravenous On call to O.R. 05/02/21 1642 05/02/21 1755        Assessment/Plan Incarcerated right inguinal hernia  POD#2 S/p Open right inguinal hernia repair with mesh patch 2/24 Dr. Zenia Resides - labs pending - Advance to clear liquid tray, Boost breeze - schedule PO tylenol for pain - Mobilize, PT/OT consults pending   ID - rocephin periop FEN - IVF, CLD VTE - sq heparin Foley - none   HTN CAD CKD-IIIb Chronic hypercalcemia and hyperparathyroidism   LOS: 2 days    Wellington [place], [facility] Surgery 05/04/2021, 8:23 AM Please see Amion for pager number during day hours 7:00am-4:30pm

## 2021-05-04 NOTE — Evaluation (Signed)
Occupational Therapy Evaluation Patient Details Name: [name] MRN: [id number] DOB: Nov 16, 1930 Today's Date: 05/04/2021   History of Present Illness 88 y.o. male presents to [facility] hospital on 05/02/2021 with R groin pain. CT abdomen pelvis showed incarcerated right inguinal hernia with small bowel trapped inside the hernia sac. Pt underwent open R inguinal hernia repair on 2/24. PMH includes HTN, CKD stage IIIb, cecal colon CA status post right hemicolectomy, CAD s/p stenting, Stroke, hyperparathyroidism.   Clinical Impression   Pt reports receiving assistance at home from daughter for LB dressing, bathes sinkside. Reports he is "slow" and uses rollator at baseline. Pt currently needing min guard - min A for most ADLs, Max A for LB ADL. Pt min guard for bed mobility and transfers with RW. Pt fatigues quickly during session, VSS on RA. Pt presenting with impairments listed below, will follow acutely. Recommend d/c home with assistance and HHOT.     Recommendations for follow up therapy are one component of a multi-disciplinary discharge planning process, led by the attending physician.  Recommendations may be updated based on patient status, additional functional criteria and insurance authorization.   Follow Up Recommendations  Home health OT    Assistance Recommended at Discharge Set up Supervision/Assistance  Patient can return home with the following A little help with walking and/or transfers;A little help with bathing/dressing/bathroom;Help with stairs or ramp for entrance;Assistance with cooking/housework    Functional Status Assessment  Patient has had a recent decline in their functional status and demonstrates the ability to make significant improvements in function in a reasonable and predictable amount of time.  Equipment Recommendations  BSC/3in1    Recommendations for Other Services       Precautions / Restrictions Precautions Precautions: Fall Restrictions Weight  Bearing Restrictions: No      Mobility Bed Mobility Overal bed mobility: Needs Assistance Bed Mobility: Supine to Sit     Supine to sit: Min guard     General bed mobility comments: increased time needed    Transfers Overall transfer level: Needs assistance Equipment used: Rolling walker (2 wheels) Transfers: Sit to/from Stand Sit to Stand: Min guard           General transfer comment: cues for hand placement, pt has difficulty moving RLE > LLE with ambulation drags feet when using RW      Balance Overall balance assessment: Needs assistance Sitting-balance support: No upper extremity supported, Feet supported Sitting balance-Leahy Scale: Good     Standing balance support: Single extremity supported, Bilateral upper extremity supported, Reliant on assistive device for balance Standing balance-Leahy Scale: Poor                             ADL either performed or assessed with clinical judgement   ADL Overall ADL's : Needs assistance/impaired Eating/Feeding: Set up;Sitting   Grooming: Set up;Sitting   Upper Body Bathing: Supervision/ safety;Sitting   Lower Body Bathing: Minimal assistance;Sitting/lateral leans   Upper Body Dressing : Min guard;Sitting   Lower Body Dressing: Sitting/lateral leans;Sit to/from stand;Maximal assistance Lower Body Dressing Details (indicate cue type and reason): to don socks sitting EOB Toilet Transfer: Min guard;Rolling walker (2 wheels);Ambulation;Regular Toilet   Toileting- Water quality scientist and Hygiene: Sitting/lateral lean;Supervision/safety Toileting - Clothing Manipulation Details (indicate cue type and reason): uses urinal/manages clothing sitting EOB     Functional mobility during ADLs: Min guard;Rolling walker (2 wheels)       Vision   Vision Assessment?: No  apparent visual deficits     Perception     Praxis      Pertinent Vitals/Pain Pain Assessment Pain Assessment: No/denies pain      Hand Dominance     Extremity/Trunk Assessment Upper Extremity Assessment Upper Extremity Assessment: Generalized weakness   Lower Extremity Assessment Lower Extremity Assessment: Defer to PT evaluation   Cervical / Trunk Assessment Cervical / Trunk Assessment: Kyphotic   Communication Communication Communication: No difficulties   Cognition Arousal/Alertness: Lethargic Behavior During Therapy: WFL for tasks assessed/performed Overall Cognitive Status: No family/caregiver present to determine baseline cognitive functioning                                 General Comments: pt requring repetition of questions, hesitant when answering A & O questions, however is A & O x4. Slow processing at times. Lethargic during session, RN aware and states he has been sleepy throughout the day     General Comments  VSS on RA    Exercises     Shoulder Instructions      Home Living Family/patient expects to be discharged to:: Private residence Living Arrangements: Spouse/significant other Available Help at Discharge: Family;Available PRN/intermittently Type of Home: House Home Access: Level entry     Home Layout: Two level;Able to live on main level with bedroom/bathroom     Bathroom Shower/Tub: Tub/shower unit;Curtain   Bathroom Toilet: Standard     Home Equipment: Rollator (4 wheels);Wheelchair - power;Shower seat          Prior Functioning/Environment Prior Level of Function : Independent/Modified Independent             Mobility Comments: reports he is "slow" ADLs Comments: assist from daughter for LB dressing, reports bathing sinkside        OT Problem List: Decreased strength;Decreased range of motion;Decreased activity tolerance;Impaired balance (sitting and/or standing)      OT Treatment/Interventions: Self-care/ADL training;Therapeutic exercise;DME and/or AE instruction;Therapeutic activities;Patient/family education;Balance training     OT Goals(Current goals can be found in the care plan section) Acute Rehab OT Goals Patient Stated Goal: none stated OT Goal Formulation: With patient Time For Goal Achievement: 05/18/21 Potential to Achieve Goals: Good ADL Goals Pt Will Perform Upper Body Dressing: sitting;with modified independence Pt Will Perform Lower Body Dressing: with min assist;sitting/lateral leans;sit to/from stand Pt Will Transfer to Toilet: with supervision;ambulating;regular height toilet Pt Will Perform Tub/Shower Transfer: rolling walker;shower seat;ambulating;with min guard assist  OT Frequency: Min 2X/week    Co-evaluation              AM-PAC OT "6 Clicks" Daily Activity     Outcome Measure Help from another person eating meals?: None Help from another person taking care of personal grooming?: A Little Help from another person toileting, which includes using toliet, bedpan, or urinal?: A Little Help from another person bathing (including washing, rinsing, drying)?: A Lot Help from another person to put on and taking off regular upper body clothing?: A Little Help from another person to put on and taking off regular lower body clothing?: A Lot 6 Click Score: 17   End of Session Equipment Utilized During Treatment: Gait belt;Rolling walker (2 wheels) Nurse Communication: Mobility status;Other (comment) (confirmed with RN that pt can have ice water)  Activity Tolerance: Patient tolerated treatment well Patient left: in bed;with call bell/phone within reach;with bed alarm set  OT Visit Diagnosis: Muscle weakness (generalized) (M62.81);Other abnormalities of gait and  mobility (R26.89);Unsteadiness on feet (R26.81)                Time: 7353-2992 OT Time Calculation (min): 19 min Charges:  OT General Charges $OT Visit: 1 Visit OT Evaluation $OT Eval Low Complexity: 1 Low  Lynnda Child, OTD, OTR/L Acute Rehab [phone]) [phone] [place] 05/04/2021, 3:39 PM

## 2021-05-04 NOTE — Progress Notes (Signed)
Chief Complaint: Belly hurts  HPI: [name] is a 88 y.o. male with medical history significant of HTN, CKD stage IIIb, cecal colon CA status post right hemicolectomy, CAD s/p stenting, Stroke, hyperparathyroidism, came with worsening of right groin pain.  Patient has a chronic right inguinal hernia but always reducible.  Patient started to feel right groin pain about 3 days ago gradually getting worse, with cramping-like intermittent but became constant this morning associated with nausea but no vomiting.  Constipated since yesterday.  ED Course: Afebrile, no tachycardia no hypotension.  Physical exam showed nonreducible right inguinal hernia.  CT abdomen pelvis showed incarcerated right inguinal hernia with small bowel trapped inside the hernia sac.  Subjective Patient has not gotten out of bed tolerating p.o.  Physical Exam: Vitals:   05/03/21 2339 05/03/21 2358 05/04/21 0418 05/04/21 0959  BP: (!) 148/62 (!) 157/62 (!) 161/67 (!) 173/64  Pulse: (!) 38 71 66 61  Resp: 16  17 17   Temp: 98.2 F (36.8 C)  98 F (36.7 C) 98.4 F (36.9 C)  TempSrc: Oral  Oral Oral  SpO2: 100% 100% 100% 100%  Height:        Constitutional: NAD, calm, comfortable Vitals:   05/03/21 2339 05/03/21 2358 05/04/21 0418 05/04/21 0959  BP: (!) 148/62 (!) 157/62 (!) 161/67 (!) 173/64  Pulse: (!) 38 71 66 61  Resp: 16  17 17   Temp: 98.2 F (36.8 C)  98 F (36.7 C) 98.4 F (36.9 C)  TempSrc: Oral  Oral Oral  SpO2: 100% 100% 100% 100%  Height:       Eyes: PERRL, lids and conjunctivae normal ENMT: Mucous membranes normal. Posterior pharynx clear of any exudate or lesions.Normal dentition.  Neck: normal, supple, no masses, no thyromegaly Respiratory: clear to auscultation bilaterally, no wheezing, no crackles. Normal respiratory effort. No accessory muscle use.  Cardiovascular: Regular rate and rhythm, no murmurs / rubs / gallops. No extremity edema. 2+ pedal pulses. No carotid bruits.  Abdomen:  tappropriate postoperative tenderness, No hepatosplenomegaly. Bowel sounds positive.  Musculoskeletal: no clubbing / cyanosis. No joint deformity upper and lower extremities. Good ROM, no contractures. Normal muscle tone.  Skin: no rashes, lesions, ulcers. No induration Neurologic: CN 2-12 grossly intact. Sensation intact, DTR normal. Strength 5/5 in all 4.  Psychiatric: Normal judgment and insight. Alert and oriented x 3. Normal mood.    Labs on Admission: I have personally reviewed following labs and imaging studies  CBC: Recent Labs  Lab 05/02/21 1234 05/03/21 0227 05/04/21 0957  WBC 6.7 13.8* 7.5  NEUTROABS 4.8  --   --   HGB 13.9 12.3* 9.7*  HCT 43.7 38.2* 30.9*  MCV 88.5 88.2 88.8  PLT 202 198 818*   Basic Metabolic Panel: Recent Labs  Lab 05/02/21 1234 05/03/21 0227  NA 135 137  K 4.9 5.2*  CL 104 108  CO2 24 19*  GLUCOSE 121* 138*  BUN 37* 37*  CREATININE 2.39* 2.26*  CALCIUM 10.9* 10.2   GFR: CrCl cannot be calculated (Unknown ideal weight.). Liver Function Tests: Recent Labs  Lab 05/02/21 1234  AST 17  ALT 9  ALKPHOS 79  BILITOT 0.6  PROT 8.1  ALBUMIN 3.2*   Recent Labs  Lab 05/02/21 1234  LIPASE 29   No results for input(s): AMMONIA in the last 168 hours. Coagulation Profile: No results for input(s): INR, PROTIME in the last 168 hours. Cardiac Enzymes: No results for input(s): CKTOTAL, CKMB, CKMBINDEX, TROPONINI in the last 168 hours. BNP (  last 3 results) No results for input(s): PROBNP in the last 8760 hours. HbA1C: No results for input(s): HGBA1C in the last 72 hours. CBG: No results for input(s): GLUCAP in the last 168 hours. Lipid Profile: No results for input(s): CHOL, HDL, LDLCALC, TRIG, CHOLHDL, LDLDIRECT in the last 72 hours. Thyroid Function Tests: No results for input(s): TSH, T4TOTAL, FREET4, T3FREE, THYROIDAB in the last 72 hours. Anemia Panel: No results for input(s): VITAMINB12, FOLATE, FERRITIN, TIBC, IRON, RETICCTPCT in  the last 72 hours. Urine analysis:    Component Value Date/Time   COLORURINE YELLOW 02/11/2014 1106   APPEARANCEUR CLEAR 02/11/2014 1106   LABSPEC 1.024 02/11/2014 1106   PHURINE 5.0 02/11/2014 1106   GLUCOSEU NEGATIVE 02/11/2014 1106   HGBUR NEGATIVE 02/11/2014 1106   BILIRUBINUR NEGATIVE 02/11/2014 1106   KETONESUR NEGATIVE 02/11/2014 1106   PROTEINUR 100 (A) 02/11/2014 1106   UROBILINOGEN 1.0 02/11/2014 1106   NITRITE NEGATIVE 02/11/2014 1106   LEUKOCYTESUR NEGATIVE 02/11/2014 1106    Radiological Exams on Admission: CT ABDOMEN PELVIS WO CONTRAST  Result Date: 05/02/2021 CLINICAL DATA:  Abdominal pain.  Scrotal swelling, dialysis patient. EXAM: CT ABDOMEN AND PELVIS WITHOUT CONTRAST TECHNIQUE: Multidetector CT imaging of the abdomen and pelvis was performed following the standard protocol without IV contrast. RADIATION DOSE REDUCTION: This exam was performed according to the departmental dose-optimization program which includes automated exposure control, adjustment of the mA and/or kV according to patient size and/or use of iterative reconstruction technique. COMPARISON:  04/28/2017 FINDINGS: Lower chest: Right basilar atelectasis. No acute abnormality. Coronary artery atherosclerosis. Hepatobiliary: No focal liver abnormality is seen. Prior cholecystectomy. Pancreas: Unremarkable. No pancreatic ductal dilatation or surrounding inflammatory changes. Spleen: Normal in size without focal abnormality. Adrenals/Urinary Tract: Adrenal glands are unremarkable. 18 mm hypodense, fluid attenuating left renal mass consistent with a cyst. No urolithiasis or obstructive uropathy. Normal bladder. Stomach/Bowel: Gastric air-fluid level. Numerous dilated loops of small bowel which are fluid-filled measuring up to 2.9 cm. Right inguinal hernia containing a loop of small bowel with small bowel feces and a small amount of fluid adjacent to the small bowel. Vascular/Lymphatic: Normal caliber abdominal aorta  with mild atherosclerosis. No lymphadenopathy. Reproductive: Prostate is unremarkable. Other: No abdominal or pelvic ascites. Small fat containing left inguinal hernia. Musculoskeletal: No acute osseous abnormality. No aggressive osseous lesion. Degenerative disease with disc height loss at L3-4, L4-5 and L5-S1 with reactive endplate changes and Schmorl's nodes. Bilateral facet arthropathy throughout the lumbar spine. Chronic T11 vertebral body compression fracture with a Schmorl's node along the superior endplate. IMPRESSION: 1. Small bowel obstruction with a transition point resulting from a right inguinal hernia containing a loop of small bowel. Feces within the small bowel loops within the right inguinal hernia and a small amount of perienteric fluid concerning for incarceration. 2. Aortic Atherosclerosis (ICD10-I70.0). Electronically Signed   By: Kathreen Devoid M.D.   On: 05/02/2021 13:31   DG Abd Portable 1V  Result Date: 05/02/2021 CLINICAL DATA:  Nasogastric tube placement EXAM: PORTABLE ABDOMEN - 1 VIEW COMPARISON:  None. FINDINGS: Nasogastric tube tip is seen overlying the gastric fundus. Normal abdominal gas pattern. Cholecystectomy clips in the right upper quadrant. IMPRESSION: Nasogastric tube tip overlying the gastric fundus. Electronically Signed   By: Fidela Salisbury M.D.   On: 05/02/2021 20:09   US SCROTUM W/DOPPLER  Result Date: 05/02/2021 CLINICAL DATA:  Testicular pain EXAM: SCROTAL ULTRASOUND DOPPLER ULTRASOUND OF THE TESTICLES TECHNIQUE: Complete ultrasound examination of the testicles, epididymis, and other scrotal structures was performed.  Color and spectral Doppler ultrasound were also utilized to evaluate blood flow to the testicles. COMPARISON:  CT examination dated May 02, 2021 FINDINGS: Right testicle Measurements: 2.8 x 1.5 x 2.0. Heterogeneous parenchyma. No mass or microlithiasis visualized. Left testicle Measurements: 4.1 x 2.9 x 2.6. No mass or microlithiasis visualized.  Right epididymis:  Epididymal cyst measuring up to 0.5 cm. Left epididymis:  Normal in size and appearance. Hydrocele:  Bilateral hydroceles, left greater than the right Varicocele:  None visualized. Pulsed Doppler interrogation of both testes demonstrates normal low resistance arterial and venous waveforms bilaterally. Others: Skin thickening with large right inguinal hernia containing echogenic bowel loops concerning for incarcerated hernia. IMPRESSION: 1. Scrotal skin thickening with edematous bowel loop in the scrotal sac concerning for incarcerated hernia. 2.  Normal blood flow to the bilateral testes. 3. Heterogeneous echotexture of the right testis, which could be reactive changes in the presence of incarcerated right inguinal hernia. 4.  Bilateral hydrocele, left greater than the right. Electronically Signed   By: Keane Police D.O.   On: 05/02/2021 14:52    EKG: Independently reviewed. Sinus, LVH, no acute ST-T changes.  Assessment/Plan Principal Problem:   Incarcerated right inguinal hernia Active Problems:   Incarcerated inguinal hernia   Incarcerated right inguinal hernia -Status post surgical repair 05/02/2021 -Defer diet per general surgery recommendations  HTN -Hold all home BP meds -PRN Hydralazine for now.  CAD -No acute issue  CKD stage IIIb -Signs of dehydration, start IV fluid.  Chronic hypercalcemia and hyperparathyroidism -Calcium level appears to be stable compared to baseline -Monitor level, if remains stable, likely can follow-up with PCP/endocrinology outpatient.  Out of bed, PT eval.  Can likely discharge in the next 24 to 48 hours if doing well ambulating and tolerating advancement of diet per general surgery.  High potential for needing short-term rehab.  Jenny Omdahl A MD  05/04/2021, 10:58 AM

## 2021-05-05 ENCOUNTER — Encounter (HOSPITAL_COMMUNITY): Payer: Self-pay | Admitting: Surgery

## 2021-05-05 LAB — BASIC METABOLIC PANEL
Anion gap: 6 (ref 5–15)
BUN: 38 mg/dL — ABNORMAL HIGH (ref 8–23)
CO2: 22 mmol/L (ref 22–32)
Calcium: 9.6 mg/dL (ref 8.9–10.3)
Chloride: 106 mmol/L (ref 98–111)
Creatinine, Ser: 2.23 mg/dL — ABNORMAL HIGH (ref 0.61–1.24)
GFR, Estimated: 28 mL/min — ABNORMAL LOW (ref >60)
Glucose, Bld: 83 mg/dL (ref 70–99)
Potassium: 4.4 mmol/L (ref 3.5–5.1)
Sodium: 134 mmol/L — ABNORMAL LOW (ref 135–145)

## 2021-05-05 LAB — CBC
HCT: 31.3 % — ABNORMAL LOW (ref 39.0–52.0)
Hemoglobin: 9.9 g/dL — ABNORMAL LOW (ref 13.0–17.0)
MCH: 28 pg (ref 26.0–34.0)
MCHC: 31.6 g/dL (ref 30.0–36.0)
MCV: 88.7 fL (ref 80.0–100.0)
Platelets: 133 10*3/uL — ABNORMAL LOW (ref 150–400)
RBC: 3.53 MIL/uL — ABNORMAL LOW (ref 4.22–5.81)
RDW: 13.8 % (ref 11.5–15.5)
WBC: 7.2 10*3/uL (ref 4.0–10.5)
nRBC: 0 % (ref 0.0–0.2)

## 2021-05-05 MED ORDER — ASPIRIN EC 81 MG PO TBEC
81.0000 mg | DELAYED_RELEASE_TABLET | Freq: Every day | ORAL | Status: DC
  Administered 2021-05-06 – 2021-05-12 (×7): 81 mg via ORAL
  Filled 2021-05-05 (×7): qty 1

## 2021-05-05 MED ORDER — POLYETHYLENE GLYCOL 3350 17 G PO PACK
17.0000 g | PACK | Freq: Every day | ORAL | Status: DC
  Administered 2021-05-05 – 2021-05-10 (×6): 17 g via ORAL
  Filled 2021-05-05 (×7): qty 1

## 2021-05-05 MED ORDER — AMLODIPINE BESYLATE 10 MG PO TABS
10.0000 mg | ORAL_TABLET | Freq: Every day | ORAL | Status: DC
  Administered 2021-05-05 – 2021-05-11 (×7): 10 mg via ORAL
  Filled 2021-05-05 (×7): qty 1

## 2021-05-05 MED ORDER — METOPROLOL TARTRATE 25 MG PO TABS
25.0000 mg | ORAL_TABLET | Freq: Two times a day (BID) | ORAL | Status: DC
  Administered 2021-05-05 – 2021-05-12 (×14): 25 mg via ORAL
  Filled 2021-05-05 (×16): qty 1

## 2021-05-05 MED ORDER — ATORVASTATIN CALCIUM 80 MG PO TABS
80.0000 mg | ORAL_TABLET | Freq: Every day | ORAL | Status: DC
  Administered 2021-05-05 – 2021-05-11 (×7): 80 mg via ORAL
  Filled 2021-05-05 (×7): qty 1

## 2021-05-05 MED ORDER — ALPRAZOLAM 0.25 MG PO TABS
0.2500 mg | ORAL_TABLET | Freq: Three times a day (TID) | ORAL | Status: DC | PRN
  Administered 2021-05-05: 0.25 mg via ORAL
  Filled 2021-05-05: qty 1

## 2021-05-05 MED ORDER — ISOSORBIDE MONONITRATE ER 60 MG PO TB24
120.0000 mg | ORAL_TABLET | Freq: Every day | ORAL | Status: DC
  Administered 2021-05-05 – 2021-05-11 (×7): 120 mg via ORAL
  Filled 2021-05-05 (×8): qty 2

## 2021-05-05 MED ORDER — NICOTINE 14 MG/24HR TD PT24
14.0000 mg | MEDICATED_PATCH | Freq: Every day | TRANSDERMAL | Status: AC
  Administered 2021-05-05: 14 mg via TRANSDERMAL
  Filled 2021-05-05: qty 1

## 2021-05-05 MED ORDER — DOCUSATE SODIUM 100 MG PO CAPS
100.0000 mg | ORAL_CAPSULE | Freq: Two times a day (BID) | ORAL | Status: DC
Start: 2021-05-05 — End: 2021-05-12
  Administered 2021-05-05 – 2021-05-10 (×12): 100 mg via ORAL
  Filled 2021-05-05 (×14): qty 1

## 2021-05-05 NOTE — Progress Notes (Signed)
° ° °  OVERNIGHT PROGRESS REPORT  Notified by RN for patient refusal to allow cardiac telemetry monitoring to be maintained in spite of redirection and education.  Attempts to maintain telemetry so far are met with increase in agitation.  Will intermittently attempt to reapply monitoring as able but may require spot monitoring for now due to agitation with application of device.     Gershon Cull MSNA MSN ACNPC-AG Acute Care Nurse Practitioner [place]

## 2021-05-05 NOTE — Progress Notes (Signed)
Progress Note  3 Days Post-Op  Subjective: Pt denies n/v but does not report flatus or BM. Denies significant pain. Wants to go home and smoke a cigarette.   Objective: Vital signs in last 24 hours: Temp:  [97.5 F (36.4 C)-98.4 F (36.9 C)] 98.1 F (36.7 C) (02/27 0733) Pulse Rate:  [57-80] 66 (02/27 0733) Resp:  [17-20] 17 (02/27 0733) BP: (141-187)/(61-89) 165/89 (02/27 0733) SpO2:  [99 %-100 %] 100 % (02/27 0733) Last BM Date : 05/05/21  Intake/Output from previous day: 02/26 0701 - 02/27 0700 In: 1140 [P.O.:1140] Out: 1000 [Urine:1000] Intake/Output this shift: No intake/output data recorded.  PE: Gen:  Alert, NAD, pleasant Pulm:  rate and effort normal on room air Abd: Soft, NT/ND, few BS heard, right groin incision cdi with firm seroma vs hematoma present  Psych: A&Ox3 Skin: no rashes noted, warm and dry   Lab Results:  Recent Labs    05/04/21 0957 05/05/21 0124  WBC 7.5 7.2  HGB 9.7* 9.9*  HCT 30.9* 31.3*  PLT 145* 133*   BMET Recent Labs    05/04/21 0957 05/05/21 0124  NA 133* 134*  K 4.5 4.4  CL 106 106  CO2 21* 22  GLUCOSE 148* 83  BUN 41* 38*  CREATININE 2.44* 2.23*  CALCIUM 9.5 9.6   PT/INR No results for input(s): LABPROT, INR in the last 72 hours. CMP     Component Value Date/Time   NA 134 (L) 05/05/2021 0124   NA 140 04/15/2021 1138   K 4.4 05/05/2021 0124   CL 106 05/05/2021 0124   CO2 22 05/05/2021 0124   GLUCOSE 83 05/05/2021 0124   BUN 38 (H) 05/05/2021 0124   BUN 34 (H) 04/15/2021 1138   CREATININE 2.23 (H) 05/05/2021 0124   CALCIUM 9.6 05/05/2021 0124   PROT 8.1 05/02/2021 1234   PROT 8.6 (H) 04/15/2021 1138   ALBUMIN 3.2 (L) 05/02/2021 1234   ALBUMIN 4.1 04/15/2021 1138   AST 17 05/02/2021 1234   ALT 9 05/02/2021 1234   ALKPHOS 79 05/02/2021 1234   BILITOT 0.6 05/02/2021 1234   BILITOT 0.6 04/15/2021 1138   GFRNONAA 28 (L) 05/05/2021 0124   GFRAA 27 (L) 02/16/2020 1005   Lipase     Component Value  Date/Time   LIPASE 29 05/02/2021 1234       Studies/Results: No results found.  Anti-infectives: Anti-infectives (From admission, onward)    Start     Dose/Rate Route Frequency Ordered Stop   05/03/21 0600  cefTRIAXone (ROCEPHIN) 2 g in sodium chloride 0.9 % 100 mL IVPB        2 g 200 mL/hr over 30 Minutes Intravenous On call to O.R. 05/02/21 1642 05/02/21 1755        Assessment/Plan Incarcerated right inguinal hernia  POD#3 S/p Open right inguinal hernia repair with mesh patch 2/24 Dr. Zenia Resides - hgb stable - seroma more likely in this setting  - Advance to FLD, add colace/miralax for bowel regimen - schedule PO tylenol for pain - ice prn for swelling - Mobilize, PT/OT recommending HH OT   ID - rocephin periop FEN - FLD, SLIV VTE - SQH Foley - none   HTN CAD CKD-IIIb Chronic hypercalcemia and hyperparathyroidism  LOS: 3 days   I reviewed hospitalist notes, last 24 h vitals and pain scores, last 48 h intake and output, and last 24 h labs and trends.  This care required moderate level of medical decision making.    Danton Sewer  Wynetta Emery [facility] Surgery 05/05/2021, 9:00 AM Please see Amion for pager number during day hours 7:00am-4:30pm
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
## 2021-05-05 NOTE — Discharge Instructions (Signed)
[facility]  INGUINAL HERNIA REPAIR: POST OP INSTRUCTIONS  Always review your discharge instruction sheet given to you by the facility where your surgery was performed. IF YOU HAVE DISABILITY OR FAMILY LEAVE FORMS, YOU MUST BRING THEM TO THE OFFICE FOR PROCESSING.   DO NOT GIVE THEM TO YOUR DOCTOR.  1. A  prescription for pain medication may be given to you upon discharge.  Take your pain medication as prescribed, if needed.  If narcotic pain medicine is not needed, then you may take acetaminophen (Tylenol) or ibuprofen (Advil) as needed. 2. Take your usually prescribed medications unless otherwise directed. If you need a refill on your pain medication, please contact your pharmacy.  They will contact our office to request authorization. Prescriptions will not be filled after 5 pm or on week-ends. 3. You should follow a light diet the first 24 hours after arrival home, such as soup and crackers, etc.  Be sure to include lots of fluids daily.  Resume your normal diet the day after surgery. 4.Most patients will experience some swelling and bruising around the umbilicus or in the groin and scrotum.  Ice packs and reclining will help.  Swelling and bruising can take several days to resolve.  6. It is common to experience some constipation if taking pain medication after surgery.  Increasing fluid intake and taking a stool softener (such as Colace) will usually help or prevent this problem from occurring.  A mild laxative (Milk of Magnesia or Miralax) should be taken according to package directions if there are no bowel movements after 48 hours. 7. Unless discharge instructions indicate otherwise, you may remove your bandages 24-48 hours after surgery, and you may shower at that time.  You may have steri-strips (small skin tapes) in place directly over the incision.  These strips should be left on the skin for 7-10 days.  If your surgeon used skin glue on the incision, you may  shower in 24 hours.  The glue will flake off over the next 2-3 weeks.  Any sutures or staples will be removed at the office during your follow-up visit. 8. ACTIVITIES:  You may resume regular (light) daily activities beginning the next day--such as daily self-care, walking, climbing stairs--gradually increasing activities as tolerated.  You may have sexual intercourse when it is comfortable.  Refrain from any heavy lifting or straining until approved by your doctor.  a.You may drive when you are no longer taking prescription pain medication, you can comfortably wear a seatbelt, and you can safely maneuver your car and apply brakes.   9.You should see your doctor in the office for a follow-up appointment approximately 2-3 weeks after your surgery.  Make sure that you call for this appointment within a day or two after you arrive home to insure a convenient appointment time.  WHEN TO CALL YOUR DOCTOR: Fever over 101.0 Inability to urinate Nausea and/or vomiting Extreme swelling or bruising Continued bleeding from incision. Increased pain, redness, or drainage from the incision  The clinic staff is available to answer your questions during regular business hours.  Please dont hesitate to call and ask to speak to one of the nurses for clinical concerns.  If you have a medical emergency, go to the nearest emergency room or call 911.  A surgeon from [facility] is always on call at the hospital   [address], [place], [place], [place]  [address] ?  [address], [place], [place]   [address] (  [phone] ? [phone] ? FAX [phone] Web site: [email or web address]

## 2021-05-05 NOTE — Progress Notes (Signed)
PROGRESS NOTE    [name]  YFV:[id number] DOB: [birth year] DOA: 05/02/2021 PCP: Mack Hook, MD   Brief Narrative:  HPI: [name] is a 88 y.o. male with medical history significant of HTN, CKD stage IIIb, cecal colon CA status post right hemicolectomy, CAD s/p stenting, Stroke, hyperparathyroidism, came with worsening of right groin pain.   Patient has a chronic right inguinal hernia but always reducible.  Patient started to feel right groin pain about 3 days ago gradually getting worse, with cramping-like intermittent but became constant this morning associated with nausea but no vomiting.  Constipated since yesterday.   ED Course: Afebrile, no tachycardia no hypotension.   Physical exam showed nonreducible right inguinal hernia.  CT abdomen pelvis showed incarcerated right inguinal hernia with small bowel trapped inside the hernia sac.  Assessment & Plan:   Principal Problem:   Incarcerated right inguinal hernia Active Problems:   Coronary artery disease   Essential hypertension   Hyperlipidemia   CKD (chronic kidney disease), stage IV (HCC)   Incarcerated inguinal hernia  Incarcerated right inguinal hernia -Status post surgical repair 05/02/2021 -Defer diet per general surgery recommendations.  Currently on FLD.  Pain controlled.   Essential hypertension: Patient appears to be taking several antihypertensives at home but currently he is off of all of those and blood pressures elevated.  I have now resumed amlodipine, Imdur and Lopressor but hold Lasix and losartan.   CAD: Asymptomatic.  We will resume aspirin and Lopressor.   CKD stage 4: Patient appears to have CKD stage IV and his creatinine is at baseline.  Patient does not have CKD stage IIIb as mentioned in previous notes.  Hyperlipidemia: Continue atorvastatin.  Chronic hypercalcemia and hyperparathyroidism -Calcium level appears to be stable compared to baseline -Monitor level, if remains stable,  likely can follow-up with PCP/endocrinology outpatient.  DVT prophylaxis: heparin injection 5,000 Units Start: 05/02/21 1515   Code Status: Full Code  Family Communication:  None present at bedside.  Plan of care discussed with patient in length and he/she verbalized understanding and agreed with it.  Status is: Inpatient Remains inpatient appropriate because: General surgery slowly advancing diet.   Estimated body mass index is 21.96 kg/m as calculated from the following:   Height as of this encounter: 5' 5.5" (1.664 m).   Weight as of 04/16/21: 60.8 kg.    Nutritional Assessment: Body mass index is 21.96 kg/m.Marland Kitchen Seen by dietician.  I agree with the assessment and plan as outlined below: Nutrition Status:        . Skin Assessment: I have examined the patient's skin and I agree with the wound assessment as performed by the wound care RN as outlined below:    Consultants:  General surgery  Procedures:  As above  Antimicrobials:  Anti-infectives (From admission, onward)    Start     Dose/Rate Route Frequency Ordered Stop   05/03/21 0600  cefTRIAXone (ROCEPHIN) 2 g in sodium chloride 0.9 % 100 mL IVPB        2 g 200 mL/hr over 30 Minutes Intravenous On call to O.R. 05/02/21 1642 05/02/21 1755         Subjective: Seen and examined.  Very minimal abdominal pain.  No other complaint.  He wants to eat.  Objective: Vitals:   05/04/21 2012 05/04/21 2015 05/05/21 0354 05/05/21 0733  BP: (!) 187/70 (!) 187/70 (!) 141/87 (!) 165/89  Pulse: 64 64 76 66  Resp:  18 18 17   Temp: (!) 97.5 F (  36.4 C) (!) 97.5 F (36.4 C) 98.3 F (36.8 C) 98.1 F (36.7 C)  TempSrc: Oral Oral Oral   SpO2: 99% 99% 100% 100%  Height:        Intake/Output Summary (Last 24 hours) at 05/05/2021 1411 Last data filed at 05/05/2021 0930 Gross per 24 hour  Intake 840 ml  Output 800 ml  Net 40 ml   There were no vitals filed for this visit.  Examination:  General exam: Appears calm and  comfortable  Respiratory system: Clear to auscultation. Respiratory effort normal. Cardiovascular system: S1 & S2 heard, RRR. No JVD, murmurs, rubs, gallops or clicks. No pedal edema. Gastrointestinal system: Abdomen is nondistended, soft and nontender. No organomegaly or masses felt. Normal bowel sounds heard. Central nervous system: Alert and oriented. No focal neurological deficits. Extremities: Symmetric 5 x 5 power. Skin: No rashes, lesions or ulcers Psychiatry: Judgement and insight appear normal. Mood & affect appropriate.    Data Reviewed: I have personally reviewed following labs and imaging studies  CBC: Recent Labs  Lab 05/02/21 1234 05/03/21 0227 05/04/21 0957 05/05/21 0124  WBC 6.7 13.8* 7.5 7.2  NEUTROABS 4.8  --   --   --   HGB 13.9 12.3* 9.7* 9.9*  HCT 43.7 38.2* 30.9* 31.3*  MCV 88.5 88.2 88.8 88.7  PLT 202 198 145* 505*   Basic Metabolic Panel: Recent Labs  Lab 05/02/21 1234 05/03/21 0227 05/04/21 0957 05/05/21 0124  NA 135 137 133* 134*  K 4.9 5.2* 4.5 4.4  CL 104 108 106 106  CO2 24 19* 21* 22  GLUCOSE 121* 138* 148* 83  BUN 37* 37* 41* 38*  CREATININE 2.39* 2.26* 2.44* 2.23*  CALCIUM 10.9* 10.2 9.5 9.6   GFR: CrCl cannot be calculated (Unknown ideal weight.). Liver Function Tests: Recent Labs  Lab 05/02/21 1234  AST 17  ALT 9  ALKPHOS 79  BILITOT 0.6  PROT 8.1  ALBUMIN 3.2*   Recent Labs  Lab 05/02/21 1234  LIPASE 29   No results for input(s): AMMONIA in the last 168 hours. Coagulation Profile: No results for input(s): INR, PROTIME in the last 168 hours. Cardiac Enzymes: No results for input(s): CKTOTAL, CKMB, CKMBINDEX, TROPONINI in the last 168 hours. BNP (last 3 results) No results for input(s): PROBNP in the last 8760 hours. HbA1C: No results for input(s): HGBA1C in the last 72 hours. CBG: No results for input(s): GLUCAP in the last 168 hours. Lipid Profile: No results for input(s): CHOL, HDL, LDLCALC, TRIG, CHOLHDL,  LDLDIRECT in the last 72 hours. Thyroid Function Tests: No results for input(s): TSH, T4TOTAL, FREET4, T3FREE, THYROIDAB in the last 72 hours. Anemia Panel: No results for input(s): VITAMINB12, FOLATE, FERRITIN, TIBC, IRON, RETICCTPCT in the last 72 hours. Sepsis Labs: Recent Labs  Lab 05/02/21 1516 05/02/21 1644  LATICACIDVEN 2.2* 2.7*    Recent Results (from the past 240 hour(s))  Resp Panel by RT-PCR (Flu A&B, Covid) Nasopharyngeal Swab     Status: None   Collection Time: 05/02/21 12:31 PM   Specimen: Nasopharyngeal Swab; Nasopharyngeal(NP) swabs in vial transport medium  Result Value Ref Range Status   SARS Coronavirus 2 by RT PCR NEGATIVE NEGATIVE Final    Comment: (NOTE) SARS-CoV-2 target nucleic acids are NOT DETECTED.  The SARS-CoV-2 RNA is generally detectable in upper respiratory specimens during the acute phase of infection. The lowest concentration of SARS-CoV-2 viral copies this assay can detect is 138 copies/mL. A negative result does not preclude SARS-Cov-2 infection and should  not be used as the sole basis for treatment or other patient management decisions. A negative result may occur with  improper specimen collection/handling, submission of specimen other than nasopharyngeal swab, presence of viral mutation(s) within the areas targeted by this assay, and inadequate number of viral copies(<138 copies/mL). A negative result must be combined with clinical observations, patient history, and epidemiological information. The expected result is Negative.  Fact Sheet for Patients:  EntrepreneurPulse.com.au  Fact Sheet for Healthcare Providers:  IncredibleEmployment.be  This test is no t yet approved or cleared by the [place] FDA and  has been authorized for detection and/or diagnosis of SARS-CoV-2 by FDA under an Emergency Use Authorization (EUA). This EUA will remain  in effect (meaning this test can be used) for the  duration of the COVID-19 declaration under Section 564(b)(1) of the Act, 21 U.S.C.section 360bbb-3(b)(1), unless the authorization is terminated  or revoked sooner.       Influenza A by PCR NEGATIVE NEGATIVE Final   Influenza B by PCR NEGATIVE NEGATIVE Final    Comment: (NOTE) The Xpert Xpress SARS-CoV-2/FLU/RSV plus assay is intended as an aid in the diagnosis of influenza from Nasopharyngeal swab specimens and should not be used as a sole basis for treatment. Nasal washings and aspirates are unacceptable for Xpert Xpress SARS-CoV-2/FLU/RSV testing.  Fact Sheet for Patients: EntrepreneurPulse.com.au  Fact Sheet for Healthcare Providers: IncredibleEmployment.be  This test is not yet approved or cleared by the [place] FDA and has been authorized for detection and/or diagnosis of SARS-CoV-2 by FDA under an Emergency Use Authorization (EUA). This EUA will remain in effect (meaning this test can be used) for the duration of the COVID-19 declaration under Section 564(b)(1) of the Act, 21 U.S.C. section 360bbb-3(b)(1), unless the authorization is terminated or revoked.  Performed at [facility], [place] [address], [place], [place] [address]      Radiology Studies: No results found.  Scheduled Meds:  acetaminophen  650 mg Oral Q6H   amLODipine  10 mg Oral Q supper   [START ON 05/06/2021] aspirin  81 mg Oral Q breakfast   atorvastatin  80 mg Oral Q supper   docusate sodium  100 mg Oral BID   feeding supplement  1 Container Oral BID BM   heparin  5,000 Units Subcutaneous Q12H   isosorbide mononitrate  120 mg Oral Q supper   metoprolol tartrate  25 mg Oral BID WC   polyethylene glycol  17 g Oral Daily   Continuous Infusions:   LOS: 3 days   Time spent: 35 minutes  Darliss Cheney, MD [facility]  05/05/2021, 2:11 PM  Please page via [place] and do not message via secure chat for urgent patient care matters. Secure chat can  be used for non urgent patient care matters.  How to contact the [facility] Attending or Consulting provider [place] or covering provider during after hours [place], for this patient?  Check the care team in [facility] and look for a) attending/consulting TRH provider listed and b) the [facility] team listed. Page or secure chat [phone]. Log into [email or web address] and use [place]'s universal password to access. If you do not have the password, please contact the hospital operator. Locate the [facility] provider you are looking for under [facility] and page to a number that you can be directly reached. If you still have difficulty reaching the provider, please page the [facility] (Director on Call) for the Hospitalists listed on amion for assistance.

## 2021-05-05 NOTE — TOC Initial Note (Signed)
Transition of Care [facility]) - Initial/Assessment Note    Patient Details  Name: [name] MRN: [id number] Date of Birth: [birth year]  [facility] CM/SW Contact:    Marilu Favre, RN Phone Number: 05/05/2021, 12:17 PM  Clinical Narrative:                  Damaris Schooner to patient at bedside and daughter [name] on speaker phone.   NCM received a consult that family wants patient to go to a SNF at discharge. PT recommendation is no follow up and has all needed DME already. Patient does not want a SNF. NCM read PT's note to [name] that he needs a little assistance with ambulating and bathing etc, he walked 100 feet with supervision. [name] stated that was wonderful and he can return home at discharge.  Expected Discharge Plan: Home/Self Care Barriers to Discharge: Continued Medical Work up   Patient Goals and CMS Choice Patient states their goals for this hospitalization and ongoing recovery are:: to return to home CMS Medicare.gov Compare Post Acute Care list provided to:: Patient    Expected Discharge Plan and Services Expected Discharge Plan: Home/Self Care   Discharge Planning Services: CM Consult   Living arrangements for the past 2 months: Single Family Home                 DME Arranged: N/A         HH Arranged: NA          Prior Living Arrangements/Services Living arrangements for the past 2 months: Single Family Home Lives with:: Adult Children Patient language and need for interpreter reviewed:: Yes Do you feel safe going back to the place where you live?: Yes      Need for Family Participation in Patient Care: Yes (Comment) Care giver support system in place?: Yes (comment) Current home services: DME Criminal Activity/Legal Involvement Pertinent to Current Situation/Hospitalization: No - Comment as needed  Activities of Daily Living Home Assistive Devices/Equipment: Cane (specify quad or straight) ADL Screening (condition at time of  admission) Patient's cognitive ability adequate to safely complete daily activities?: Yes Is the patient deaf or have difficulty hearing?: No Does the patient have difficulty seeing, even when wearing glasses/contacts?: Yes Does the patient have difficulty concentrating, remembering, or making decisions?: No Patient able to express need for assistance with ADLs?: Yes Does the patient have difficulty dressing or bathing?: No Independently performs ADLs?: Yes (appropriate for developmental age) Does the patient have difficulty walking or climbing stairs?: Yes Weakness of Legs: Both Weakness of Arms/Hands: Both  Permission Sought/Granted   Permission granted to share information with : Yes, Verbal Permission Granted  Share Information with NAME: daughter [name]           Emotional Assessment Appearance:: Appears stated age Attitude/Demeanor/Rapport: Engaged Affect (typically observed): Accepting Orientation: : Oriented to Self, Oriented to Place, Oriented to  Time, Oriented to Situation Alcohol / Substance Use: Not Applicable Psych Involvement: No (comment)  Admission diagnosis:  Small bowel obstruction (HCC) [K56.609] Testicle pain [N50.819] NSTEMI (non-ST elevated myocardial infarction) ([place]) [I21.4] Incarcerated hernia [K46.0] Incarcerated right inguinal hernia [K40.30] Patient Active Problem List   Diagnosis Date Noted   Incarcerated inguinal hernia 05/02/2021   Incarcerated right inguinal hernia 05/02/2021   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 [facility])    Right inguinal hernia 11/29/2019   Decreased hearing, bilateral 11/29/2019   Stage 3b chronic kidney disease ([place]) 08/29/2019   Hyperparathyroidism ([place]) 06/20/2019   Atypical chest  pain 01/20/2018   Right carotid bruit 07/07/2017   Tobacco abuse 05/26/2017   Chest pain 04/27/2016   Unstable angina ([place]) 04/26/2016   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/02/2015   Ataxic gait 09/18/2015   History of prostate cancer 09/18/2015    Prediabetes 07/29/2015   CKD (chronic kidney disease) 04/19/2015   Chronic diastolic HF (heart failure) (HCC)    COPD (chronic obstructive pulmonary disease) ([place]) 02/10/2014   Coronary artery disease 01/25/2013    Class: Chronic   Essential hypertension 01/25/2013    Class: Chronic   Hyperlipidemia 01/25/2013    Class: Chronic   History of colon cancer 05/19/2006   Kidney stones 1954   PCP:  Mack Hook, MD Pharmacy:   [facility], [place] - [address] [place] [address] Phone: [phone] Fax: [phone]  [facility] [place], [place] - [address] [place] [address] Phone: [phone] Fax: [phone]     Social Determinants of Health (SDOH) Interventions    Readmission Risk Interventions No flowsheet data found.

## 2021-05-05 NOTE — Care Management Important Message (Signed)
Important Message  Patient Details  Name: [name] MRN: [id number] Date of Birth: 1930/09/21   Medicare Important Message Given:  Yes     Hannah Beat 05/05/2021, 12:39 PM

## 2021-05-05 NOTE — Plan of Care (Signed)
  Problem: Pain Managment: Goal: General experience of comfort will improve Outcome: Progressing   Problem: Safety: Goal: Ability to remain free from injury will improve Outcome: Progressing   Problem: Nutrition: Goal: Adequate nutrition will be maintained Outcome: Not Progressing   

## 2021-05-06 LAB — CBC
HCT: 28.9 % — ABNORMAL LOW (ref 39.0–52.0)
Hemoglobin: 9.4 g/dL — ABNORMAL LOW (ref 13.0–17.0)
MCH: 28.2 pg (ref 26.0–34.0)
MCHC: 32.5 g/dL (ref 30.0–36.0)
MCV: 86.8 fL (ref 80.0–100.0)
Platelets: 123 10*3/uL — ABNORMAL LOW (ref 150–400)
RBC: 3.33 MIL/uL — ABNORMAL LOW (ref 4.22–5.81)
RDW: 13.5 % (ref 11.5–15.5)
WBC: 4.8 10*3/uL (ref 4.0–10.5)
nRBC: 0 % (ref 0.0–0.2)

## 2021-05-06 LAB — BASIC METABOLIC PANEL
Anion gap: 9 (ref 5–15)
BUN: 32 mg/dL — ABNORMAL HIGH (ref 8–23)
CO2: 21 mmol/L — ABNORMAL LOW (ref 22–32)
Calcium: 9.5 mg/dL (ref 8.9–10.3)
Chloride: 105 mmol/L (ref 98–111)
Creatinine, Ser: 2.03 mg/dL — ABNORMAL HIGH (ref 0.61–1.24)
GFR, Estimated: 31 mL/min — ABNORMAL LOW (ref >60)
Glucose, Bld: 85 mg/dL (ref 70–99)
Potassium: 4.4 mmol/L (ref 3.5–5.1)
Sodium: 135 mmol/L (ref 135–145)

## 2021-05-06 MED ORDER — QUETIAPINE FUMARATE 50 MG PO TABS
25.0000 mg | ORAL_TABLET | ORAL | Status: DC
  Administered 2021-05-06 – 2021-05-11 (×6): 25 mg via ORAL
  Filled 2021-05-06 (×6): qty 1

## 2021-05-06 MED ORDER — POLYETHYLENE GLYCOL 3350 17 G PO PACK: 17.0000 g | PACK | Freq: Every day | ORAL | Status: DC | PRN

## 2021-05-06 MED ORDER — ACETAMINOPHEN 325 MG PO TABS: 650.0000 mg | ORAL_TABLET | Freq: Four times a day (QID) | ORAL | Status: DC | PRN

## 2021-05-06 MED ORDER — LOSARTAN POTASSIUM 50 MG PO TABS
100.0000 mg | ORAL_TABLET | Freq: Every day | ORAL | Status: DC
  Administered 2021-05-06 – 2021-05-12 (×7): 100 mg via ORAL
  Filled 2021-05-06 (×7): qty 2

## 2021-05-06 MED ORDER — DOCUSATE SODIUM 100 MG PO CAPS: 100.0000 mg | ORAL_CAPSULE | Freq: Every day | ORAL | Status: DC | PRN

## 2021-05-06 MED ORDER — HALOPERIDOL LACTATE 5 MG/ML IJ SOLN
2.5000 mg | Freq: Once | INTRAMUSCULAR | Status: AC
  Administered 2021-05-06: 2.5 mg via INTRAMUSCULAR
  Filled 2021-05-06: qty 1

## 2021-05-06 MED ORDER — OXYCODONE HCL 5 MG PO TABS: 2.5000 mg | ORAL_TABLET | ORAL | 0 refills | Status: DC | PRN

## 2021-05-06 NOTE — Plan of Care (Signed)

## 2021-05-06 NOTE — Progress Notes (Signed)
PROGRESS NOTE    [name]  CHE:[id number] DOB: April 18, 1930 DOA: 05/02/2021 PCP: Mack Hook, MD   Brief Narrative:  HPI: [name] is a 88 y.o. male with medical history significant of HTN, CKD stage IIIb, cecal colon CA status post right hemicolectomy, CAD s/p stenting, Stroke, hyperparathyroidism, came with worsening of right groin pain.   Patient has a chronic right inguinal hernia but always reducible.  Patient started to feel right groin pain about 3 days ago gradually getting worse, with cramping-like intermittent but became constant this morning associated with nausea but no vomiting.  Constipated since yesterday.   ED Course: Afebrile, no tachycardia no hypotension.  Physical exam showed nonreducible right inguinal hernia.  CT abdomen pelvis showed incarcerated right inguinal hernia with small bowel trapped inside the hernia sac.  Assessment & Plan:   Principal Problem:   Incarcerated right inguinal hernia Active Problems:   Coronary artery disease   Essential hypertension   Hyperlipidemia   CKD (chronic kidney disease), stage IV ([place])   Incarcerated inguinal hernia  Incarcerated right inguinal hernia -Status post surgical repair 05/02/2021.  Personal note, he is doing well on that perspective and they have advance to soft diet and he is cleared to discharge from their end.   Essential hypertension: Blood pressure slightly elevated.  Continue amlodipine, Imdur and Lopressor and resume losartan today but hold Lasix.   CAD: Asymptomatic.  We will resume aspirin and Lopressor.   CKD stage 4: Patient appears to have CKD stage IV and his creatinine is at baseline.  Patient does not have CKD stage IIIb as mentioned in previous notes.  Hyperlipidemia: Continue atorvastatin.  Delirium: Per notes, patient had episode of altered mental status/delirium last night and required Haldol.  This morning he was sleeping and a sitter was at the bedside.  We will place him on  nightly Seroquel 25 mg and as needed Haldol.  Discontinue Xanax.  Chronic hypercalcemia and hyperparathyroidism -Calcium level appears to be stable compared to baseline -Monitor level, if remains stable, likely can follow-up with PCP/endocrinology outpatient.  DVT prophylaxis: heparin injection 5,000 Units Start: 05/02/21 1515   Code Status: Full Code  Family Communication:  None present at bedside.    Status is: Inpatient Remains inpatient appropriate because: General surgery slowly advancing diet.   Estimated body mass index is 21.96 kg/m as calculated from the following:   Height as of this encounter: 5' 5.5" (1.664 m).   Weight as of 04/16/21: 60.8 kg.    Nutritional Assessment: Body mass index is 21.96 kg/m.Marland Kitchen Seen by dietician.  I agree with the assessment and plan as outlined below: Nutrition Status:   Skin Assessment: I have examined the patient's skin and I agree with the wound assessment as performed by the wound care RN as outlined below:    Consultants:  General surgery  Procedures:  As above  Antimicrobials:  Anti-infectives (From admission, onward)    Start     Dose/Rate Route Frequency Ordered Stop   05/03/21 0600  cefTRIAXone (ROCEPHIN) 2 g in sodium chloride 0.9 % 100 mL IVPB        2 g 200 mL/hr over 30 Minutes Intravenous On call to O.R. 05/02/21 1642 05/02/21 1755         Subjective: Patient seen and examined.  He was sleeping this morning as he had received Haldol overnight.  Sitter was at the bedside.  Objective: Vitals:   05/05/21 0733 05/05/21 1546 05/05/21 2010 05/06/21 0856  BP: (!) 165/89 Marland Kitchen)  189/73 (!) 152/57 (!) 161/73  Pulse: 66 84 64 71  Resp: 17 17 17 18   Temp: 98.1 F (36.7 C) 97.7 F (36.5 C) 98.3 F (36.8 C) 97.8 F (36.6 C)  TempSrc:   Oral   SpO2: 100% 100% 98% 100%  Height:        Intake/Output Summary (Last 24 hours) at 05/06/2021 1355 Last data filed at 05/06/2021 1344 Gross per 24 hour  Intake 777 ml  Output  400 ml  Net 377 ml    There were no vitals filed for this visit.  Examination:  General exam: Sleepy Respiratory system: Clear to auscultation. Respiratory effort normal. Cardiovascular system: S1 & S2 heard, RRR. No JVD, murmurs, rubs, gallops or clicks. No pedal edema. Gastrointestinal system: Abdomen is nondistended, soft and nontender. No organomegaly or masses felt. Normal bowel sounds heard. Central nervous system: Sleepy  Data Reviewed: I have personally reviewed following labs and imaging studies  CBC: Recent Labs  Lab 05/02/21 1234 05/03/21 0227 05/04/21 0957 05/05/21 0124 05/06/21 0910  WBC 6.7 13.8* 7.5 7.2 4.8  NEUTROABS 4.8  --   --   --   --   HGB 13.9 12.3* 9.7* 9.9* 9.4*  HCT 43.7 38.2* 30.9* 31.3* 28.9*  MCV 88.5 88.2 88.8 88.7 86.8  PLT 202 198 145* 133* 123*    Basic Metabolic Panel: Recent Labs  Lab 05/02/21 1234 05/03/21 0227 05/04/21 0957 05/05/21 0124 05/06/21 0910  NA 135 137 133* 134* 135  K 4.9 5.2* 4.5 4.4 4.4  CL 104 108 106 106 105  CO2 24 19* 21* 22 21*  GLUCOSE 121* 138* 148* 83 85  BUN 37* 37* 41* 38* 32*  CREATININE 2.39* 2.26* 2.44* 2.23* 2.03*  CALCIUM 10.9* 10.2 9.5 9.6 9.5    GFR: CrCl cannot be calculated (Unknown ideal weight.). Liver Function Tests: Recent Labs  Lab 05/02/21 1234  AST 17  ALT 9  ALKPHOS 79  BILITOT 0.6  PROT 8.1  ALBUMIN 3.2*    Recent Labs  Lab 05/02/21 1234  LIPASE 29    No results for input(s): AMMONIA in the last 168 hours. Coagulation Profile: No results for input(s): INR, PROTIME in the last 168 hours. Cardiac Enzymes: No results for input(s): CKTOTAL, CKMB, CKMBINDEX, TROPONINI in the last 168 hours. BNP (last 3 results) No results for input(s): PROBNP in the last 8760 hours. HbA1C: No results for input(s): HGBA1C in the last 72 hours. CBG: No results for input(s): GLUCAP in the last 168 hours. Lipid Profile: No results for input(s): CHOL, HDL, LDLCALC, TRIG, CHOLHDL,  LDLDIRECT in the last 72 hours. Thyroid Function Tests: No results for input(s): TSH, T4TOTAL, FREET4, T3FREE, THYROIDAB in the last 72 hours. Anemia Panel: No results for input(s): VITAMINB12, FOLATE, FERRITIN, TIBC, IRON, RETICCTPCT in the last 72 hours. Sepsis Labs: Recent Labs  Lab 05/02/21 1516 05/02/21 1644  LATICACIDVEN 2.2* 2.7*     Recent Results (from the past 240 hour(s))  Resp Panel by RT-PCR (Flu A&B, Covid) Nasopharyngeal Swab     Status: None   Collection Time: 05/02/21 12:31 PM   Specimen: Nasopharyngeal Swab; Nasopharyngeal(NP) swabs in vial transport medium  Result Value Ref Range Status   SARS Coronavirus 2 by RT PCR NEGATIVE NEGATIVE Final    Comment: (NOTE) SARS-CoV-2 target nucleic acids are NOT DETECTED.  The SARS-CoV-2 RNA is generally detectable in upper respiratory specimens during the acute phase of infection. The lowest concentration of SARS-CoV-2 viral copies this assay can detect is  138 copies/mL. A negative result does not preclude SARS-Cov-2 infection and should not be used as the sole basis for treatment or other patient management decisions. A negative result may occur with  improper specimen collection/handling, submission of specimen other than nasopharyngeal swab, presence of viral mutation(s) within the areas targeted by this assay, and inadequate number of viral copies(<138 copies/mL). A negative result must be combined with clinical observations, patient history, and epidemiological information. The expected result is Negative.  Fact Sheet for Patients:  EntrepreneurPulse.com.au  Fact Sheet for Healthcare Providers:  IncredibleEmployment.be  This test is no t yet approved or cleared by the [place] FDA and  has been authorized for detection and/or diagnosis of SARS-CoV-2 by FDA under an Emergency Use Authorization (EUA). This EUA will remain  in effect (meaning this test can be used) for the  duration of the COVID-19 declaration under Section 564(b)(1) of the Act, 21 U.S.C.section 360bbb-3(b)(1), unless the authorization is terminated  or revoked sooner.       Influenza A by PCR NEGATIVE NEGATIVE Final   Influenza B by PCR NEGATIVE NEGATIVE Final    Comment: (NOTE) The Xpert Xpress SARS-CoV-2/FLU/RSV plus assay is intended as an aid in the diagnosis of influenza from Nasopharyngeal swab specimens and should not be used as a sole basis for treatment. Nasal washings and aspirates are unacceptable for Xpert Xpress SARS-CoV-2/FLU/RSV testing.  Fact Sheet for Patients: EntrepreneurPulse.com.au  Fact Sheet for Healthcare Providers: IncredibleEmployment.be  This test is not yet approved or cleared by the [place] FDA and has been authorized for detection and/or diagnosis of SARS-CoV-2 by FDA under an Emergency Use Authorization (EUA). This EUA will remain in effect (meaning this test can be used) for the duration of the COVID-19 declaration under Section 564(b)(1) of the Act, 21 U.S.C. section 360bbb-3(b)(1), unless the authorization is terminated or revoked.  Performed at [facility], [place] [address] [place], [place] [address]       Radiology Studies: No results found.  Scheduled Meds:  acetaminophen  650 mg Oral Q6H   amLODipine  10 mg Oral Q supper   aspirin EC  81 mg Oral Q breakfast   atorvastatin  80 mg Oral Q supper   docusate sodium  100 mg Oral BID   feeding supplement  1 Container Oral BID BM   heparin  5,000 Units Subcutaneous Q12H   isosorbide mononitrate  120 mg Oral Q supper   metoprolol tartrate  25 mg Oral BID WC   nicotine  14 mg Transdermal Daily   polyethylene glycol  17 g Oral Daily   Continuous Infusions:   LOS: 4 days   Time spent: 30 minutes  Darliss Cheney, MD [facility]  05/06/2021, 1:55 PM  Please page via [place] and do not message via secure chat for urgent patient care  matters. Secure chat can be used for non urgent patient care matters.  How to contact the [facility] Attending or Consulting provider [place] or covering provider during after hours [place], for this patient?  Check the care team in [facility] and look for a) attending/consulting TR[facility] provider listed and b) the [facility] team listed. Page or secure chat [phone]. Log into [email or web address] and use [place]'s universal password to access. If you do not have the password, please contact the hospital operator. Locate the [facility] provider you are looking for under [facility] and page to a number that you can be directly reached. If you still have difficulty reaching  the provider, please page the [facility] (Director on Call) for the Hospitalists listed on amion for assistance.

## 2021-05-06 NOTE — Progress Notes (Signed)
Occupational Therapy Treatment Patient Details Name: [name] MRN: [id number] DOB: 1930/09/18 Today's Date: 05/06/2021   History of present illness Pt is 88 y.o. male presents to [facility] hospital on 05/02/2021 with R groin pain. CT abdomen pelvis showed incarcerated right inguinal hernia with small bowel trapped inside the hernia sac. Pt underwent open R inguinal hernia repair on 2/24. PMH includes HTN, CKD stage IIIb, cecal colon CA status post right hemicolectomy, CAD s/p stenting, Stroke, hyperparathyroidism.   OT comments  Pt not progressing towards goals this session, very lethargic requiring verbal and tactile cues to remain alert and participatory during session. Only oriented to self at this time, and follows single step commands with increased time. Pt able to complete UE/LE exercises sitting up in chair and ambulate short distance in room, fatigued quickly. Pt presenting with impairments listed below, updating d/c recommendation to SNF at this time as pt's decline in function from previous session. Will continue to follow acutely.   Recommendations for follow up therapy are one component of a multi-disciplinary discharge planning process, led by the attending physician.  Recommendations may be updated based on patient status, additional functional criteria and insurance authorization.    Follow Up Recommendations  Skilled nursing-short term rehab (<3 hours/day)    Assistance Recommended at Discharge Frequent or constant Supervision/Assistance  Patient can return home with the following  A lot of help with walking and/or transfers;A lot of help with bathing/dressing/bathroom;Assistance with cooking/housework;Assist for transportation;Help with stairs or ramp for entrance   Equipment Recommendations  BSC/3in1    Recommendations for Other Services      Precautions / Restrictions Precautions Precautions: Fall Precaution Comments: has sitter Restrictions Weight Bearing Restrictions:  No       Mobility Bed Mobility               General bed mobility comments: OOB in chair upon arrival    Transfers Overall transfer level: Needs assistance Equipment used: Rolling walker (2 wheels) Transfers: Sit to/from Stand Sit to Stand: Min assist                 Balance Overall balance assessment: Needs assistance Sitting-balance support: No upper extremity supported, Feet supported Sitting balance-Leahy Scale: Fair     Standing balance support: Bilateral upper extremity supported Standing balance-Leahy Scale: Poor                             ADL either performed or assessed with clinical judgement   ADL Overall ADL's : Needs assistance/impaired     Grooming: Set up;Sitting Grooming Details (indicate cue type and reason): washes face sitting up in chair                 Toilet Transfer: Minimal assistance;Rolling walker (2 wheels);Ambulation;Regular Glass blower/designer Details (indicate cue type and reason): simulated in room                Extremity/Trunk Assessment Upper Extremity Assessment Upper Extremity Assessment: Generalized weakness   Lower Extremity Assessment Lower Extremity Assessment: Defer to PT evaluation        Vision   Vision Assessment?: No apparent visual deficits   Perception Perception Perception: Not tested   Praxis Praxis Praxis: Not tested    Cognition Arousal/Alertness: Lethargic Behavior During Therapy: WFL for tasks assessed/performed Overall Cognitive Status: Impaired/Different from baseline Area of Impairment: Orientation, Attention, Following commands, Safety/judgement, Awareness, Problem solving  Orientation Level: Disoriented to, Place, Time, Situation Current Attention Level: Sustained   Following Commands: Follows one step commands inconsistently, Follows one step commands with increased time Safety/Judgement: Decreased awareness of safety Awareness:  Intellectual Problem Solving: Slow processing, Decreased initiation, Difficulty sequencing, Requires verbal cues, Requires tactile cues General Comments: Pt very lethargic requiring increased cues to stay awake and participate. Noted in MD note - pt with bad night, AMS, required haldol. When asked, and given 2 options pt thinking he is at grocery store, aware he is in [employer]        Exercises Exercises: General Upper Extremity, General Lower Extremity General Exercises - Upper Extremity Shoulder Flexion: AROM, 5 reps, Both, Seated General Exercises - Lower Extremity Short Arc Quad: AROM, Both, 10 reps, Seated Hip Flexion/Marching: AROM, Both, 10 reps, Seated    Shoulder Instructions       General Comments sitter in room upon arrival, pt lethargic requiring increased verbal/tactile cuing to remain alert and participate during session    Pertinent Vitals/ Pain       Pain Assessment Pain Assessment: Faces Pain Score: 6  Faces Pain Scale: Hurts even more Pain Location: R groin with trunk flexion Pain Descriptors / Indicators: Aching, Discomfort, Grimacing, Guarding Pain Intervention(s): Limited activity within patient's tolerance, Monitored during session, Repositioned  Home Living                                          Prior Functioning/Environment              Frequency  Min 2X/week        Progress Toward Goals  OT Goals(current goals can now be found in the care plan section)  Progress towards OT goals: Progressing toward goals  Acute Rehab OT Goals Patient Stated Goal: none stated OT Goal Formulation: With patient Time For Goal Achievement: 05/18/21 Potential to Achieve Goals: Good ADL Goals Pt Will Perform Upper Body Dressing: sitting;with modified independence Pt Will Perform Lower Body Dressing: with min assist;sitting/lateral leans;sit to/from stand Pt Will Transfer to Toilet: with supervision;ambulating;regular height toilet Pt  Will Perform Tub/Shower Transfer: rolling walker;shower seat;ambulating;with min guard assist  Plan Frequency remains appropriate;Discharge plan needs to be updated    Co-evaluation                 AM-PAC OT "6 Clicks" Daily Activity     Outcome Measure   Help from another person eating meals?: None Help from another person taking care of personal grooming?: A Little Help from another person toileting, which includes using toliet, bedpan, or urinal?: A Lot Help from another person bathing (including washing, rinsing, drying)?: A Lot Help from another person to put on and taking off regular upper body clothing?: A Lot Help from another person to put on and taking off regular lower body clothing?: Total 6 Click Score: 14    End of Session Equipment Utilized During Treatment: Gait belt;Rolling walker (2 wheels)  OT Visit Diagnosis: Muscle weakness (generalized) (M62.81);Other abnormalities of gait and mobility (R26.89);Unsteadiness on feet (R26.81)   Activity Tolerance Patient tolerated treatment well   Patient Left in chair;with call bell/phone within reach;with nursing/sitter in room;with chair alarm set   Nurse Communication Mobility status;Other (comment) (pt's level of arousal/orientation)        Time: 7741-2878 OT Time Calculation (min): 12 min  Charges: OT General Charges $OT Visit: 1 Visit OT  Treatments $Therapeutic Activity: 8-22 mins  Lynnda Child, OTD, OTR/L Acute Rehab [phone] - [place] 05/06/2021, 4:26 PM

## 2021-05-06 NOTE — Progress Notes (Signed)
The Sitter and I helped the patient transfer from thr chair to the bed when he sat on the bed we thought he would hold his upper body weight up but he did not and laid back and hit the back of his head on the bed rail. he stated it hurt but he also his having groin pain from the surgery, MD notified via secure chat patient given pain medication awaiting further instructions.

## 2021-05-06 NOTE — Progress Notes (Signed)
Physical Therapy Treatment Patient Details Name: [name] MRN: [id number] DOB: [birth year] Today's Date: 05/06/2021   History of Present Illness Pt is 88 y.o. male presents to [facility] hospital on 05/02/2021 with R groin pain. CT abdomen pelvis showed incarcerated right inguinal hernia with small bowel trapped inside the hernia sac. Pt underwent open R inguinal hernia repair on 2/24. PMH includes HTN, CKD stage IIIb, cecal colon CA status post right hemicolectomy, CAD s/p stenting, Stroke, hyperparathyroidism.    PT Comments    Pt with significant decline in performance today.  Noted that pt with AMS last night and was given Haldol.  Today , pt very lethargic, requiring mod cues and min-mod A for mobility.  Pt incontinent of stool in bed and unaware.  Pt tending to drag R LE with gait but could improve with cues and MMT was equal bilaterally. Suspect decline in performance from AMS, lack of rest, and haldol, but did update recommendations to SNF as pt no longer safe to return home.  If cognition and mobility improve, potential to progress to home level once again.     Recommendations for follow up therapy are one component of a multi-disciplinary discharge planning process, led by the attending physician.  Recommendations may be updated based on patient status, additional functional criteria and insurance authorization.  Follow Up Recommendations  Skilled nursing-short term rehab (<3 hours/day)     Assistance Recommended at Discharge Frequent or constant Supervision/Assistance  Patient can return home with the following A lot of help with bathing/dressing/bathroom;A lot of help with walking and/or transfers;Help with stairs or ramp for entrance   Equipment Recommendations  None recommended by PT    Recommendations for Other Services       Precautions / Restrictions Precautions Precautions: Fall     Mobility  Bed Mobility Overal bed mobility: Needs Assistance Bed Mobility: Supine to  Sit     Supine to sit: Min assist     General bed mobility comments: Min A but mod cues with increased time    Transfers Overall transfer level: Needs assistance Equipment used: Rolling walker (2 wheels) Transfers: Sit to/from Stand Sit to Stand: Min assist           General transfer comment: Performed from bed x 1 and toilet x 1 with use of grab bar, mod cues and min A    Ambulation/Gait Ambulation/Gait assistance: Mod assist Gait Distance (Feet): 60 Feet (20' then 60')   Gait Pattern/deviations: Step-to pattern, Decreased stride length, Decreased stance time - right, Decreased weight shift to right, Decreased dorsiflexion - right, Shuffle, Trunk flexed Gait velocity: reduced     General Gait Details: Pt with slowed gait and tending to drag R LE behind - could improve with cues but still shuffle gait.  Multiple cues for RW proximity and posture.  Mod A for balance and RW control   Stairs             Wheelchair Mobility    Modified Rankin (Stroke Patients Only)       Balance Overall balance assessment: Needs assistance Sitting-balance support: No upper extremity supported, Feet supported Sitting balance-Leahy Scale: Fair     Standing balance support: Bilateral upper extremity supported Standing balance-Leahy Scale: Poor Standing balance comment: Requiring RW and min-mod A; required assist with ADLs                            Cognition Arousal/Alertness: Lethargic Behavior During Therapy:  WFL for tasks assessed/performed Overall Cognitive Status: Impaired/Different from baseline Area of Impairment: Orientation, Attention, Following commands, Safety/judgement, Awareness, Problem solving                 Orientation Level: Disoriented to, Place, Time, Situation Current Attention Level: Sustained   Following Commands: Follows one step commands inconsistently Safety/Judgement: Decreased awareness of safety, Decreased awareness of  deficits Awareness: Intellectual Problem Solving: Slow processing, Decreased initiation, Difficulty sequencing, Requires verbal cues, Requires tactile cues General Comments: Pt very lethargic requiring increased cues to stay awake and participate.  Pt initially stating he is at home.  Later, asking where his wife is (sister present and reports in nursing home). .  Noted in MD note - pt with bad night, AMS, required haldol.        Exercises      General Comments General comments (skin integrity, edema, etc.): Upon arrival sitter trying to get pt alert.  When covers removed , pt had had BM and was unaware.  Sitter and PT cleaned pt in supine.  Then began ambulation for about 10' and pt asking to get to bathroom - pt incontinent of stool and urine on the way to bathroom.  Increased time cleaning pt and room, sitter assisting.  Pt then able to ambulate in hallway.  Noted pt to be dragging R foot but could improve with cues.  Tested MMT back in room.  Bil hands equal grip, some difficulty straightening L fingers but appears chronic/arthritis.  Arm elevation equal and no drift.  Facial muscles equal.  Knee ext and ankle DF MMT 4+/5 bil but with increased cues      Pertinent Vitals/Pain Pain Assessment Pain Assessment: Faces Faces Pain Scale: Hurts a little bit    Home Living                          Prior Function            PT Goals (current goals can now be found in the care plan section) Progress towards PT goals: Not progressing toward goals - comment (AMS, haldol)    Frequency    Min 3X/week      PT Plan Discharge plan needs to be updated    Co-evaluation              AM-PAC PT "6 Clicks" Mobility   Outcome Measure  Help needed turning from your back to your side while in a flat bed without using bedrails?: A Little Help needed moving from lying on your back to sitting on the side of a flat bed without using bedrails?: A Lot Help needed moving to and from  a bed to a chair (including a wheelchair)?: A Lot Help needed standing up from a chair using your arms (e.g., wheelchair or bedside chair)?: A Lot Help needed to walk in hospital room?: A Lot Help needed climbing 3-5 steps with a railing? : Total 6 Click Score: 12    End of Session Equipment Utilized During Treatment: Gait belt Activity Tolerance: Patient tolerated treatment well Patient left: with chair alarm set;in chair;with nursing/sitter in room;with call bell/phone within reach;with family/visitor present Nurse Communication: Mobility status PT Visit Diagnosis: Other abnormalities of gait and mobility (R26.89);Muscle weakness (generalized) (M62.81)     Time: 0623-7628 PT Time Calculation (min) (ACUTE ONLY): 30 min  Charges:  $Gait Training: 8-22 mins $Therapeutic Activity: 8-22 mins  Abran Richard, PT Acute Rehab Services Pager [phone] [facility] [place] 05/06/2021, 2:14 PM

## 2021-05-06 NOTE — Progress Notes (Signed)
Progress Note  4 Days Post-Op  Subjective: Pt with some agitation overnight, got haldol. Abdomen is soft and appears to have tolerated FLD.  Objective: Vital signs in last 24 hours: Temp:  [97.7 F (36.5 C)-98.3 F (36.8 C)] 98.3 F (36.8 C) (02/27 2010) Pulse Rate:  [64-84] 64 (02/27 2010) Resp:  [17] 17 (02/27 2010) BP: (152-189)/(57-73) 152/57 (02/27 2010) SpO2:  [98 %-100 %] 98 % (02/27 2010) Last BM Date : 05/05/21  Intake/Output from previous day: 02/27 0701 - 02/28 0700 In: 900 [P.O.:900] Out: 600 [Urine:600] Intake/Output this shift: No intake/output data recorded.  PE: Gen:  Alert, NAD, pleasant Pulm:  rate and effort normal on room air Abd: Soft, NT/ND, few BS heard, right groin incision cdi with firm seroma vs hematoma present  Skin: no rashes noted, warm and dry   Lab Results:  Recent Labs    05/04/21 0957 05/05/21 0124  WBC 7.5 7.2  HGB 9.7* 9.9*  HCT 30.9* 31.3*  PLT 145* 133*   BMET Recent Labs    05/04/21 0957 05/05/21 0124  NA 133* 134*  K 4.5 4.4  CL 106 106  CO2 21* 22  GLUCOSE 148* 83  BUN 41* 38*  CREATININE 2.44* 2.23*  CALCIUM 9.5 9.6   PT/INR No results for input(s): LABPROT, INR in the last 72 hours. CMP     Component Value Date/Time   NA 134 (L) 05/05/2021 0124   NA 140 04/15/2021 1138   K 4.4 05/05/2021 0124   CL 106 05/05/2021 0124   CO2 22 05/05/2021 0124   GLUCOSE 83 05/05/2021 0124   BUN 38 (H) 05/05/2021 0124   BUN 34 (H) 04/15/2021 1138   CREATININE 2.23 (H) 05/05/2021 0124   CALCIUM 9.6 05/05/2021 0124   PROT 8.1 05/02/2021 1234   PROT 8.6 (H) 04/15/2021 1138   ALBUMIN 3.2 (L) 05/02/2021 1234   ALBUMIN 4.1 04/15/2021 1138   AST 17 05/02/2021 1234   ALT 9 05/02/2021 1234   ALKPHOS 79 05/02/2021 1234   BILITOT 0.6 05/02/2021 1234   BILITOT 0.6 04/15/2021 1138   GFRNONAA 28 (L) 05/05/2021 0124   GFRAA 27 (L) 02/16/2020 1005   Lipase     Component Value Date/Time   LIPASE 29 05/02/2021 1234        Studies/Results: No results found.  Anti-infectives: Anti-infectives (From admission, onward)    Start     Dose/Rate Route Frequency Ordered Stop   05/03/21 0600  cefTRIAXone (ROCEPHIN) 2 g in sodium chloride 0.9 % 100 mL IVPB        2 g 200 mL/hr over 30 Minutes Intravenous On call to O.R. 05/02/21 1642 05/02/21 1755        Assessment/Plan Incarcerated right inguinal hernia  POD#4 S/p Open right inguinal hernia repair with mesh patch 2/24 Dr. Zenia Resides - agitation overnight - repeat labs to r/o chemical cause of delirium  - Advance to soft, continue colace/miralax for bowel regimen - minimize narcotics  - ice prn for swelling - Mobilize, PT/OT recommending HH OT - stable for discharge from a surgical standpoint when tolerating soft diet and having bowel function. Working on scheduling follow up and will put in AVS   ID - rocephin periop FEN - Soft diet, SLIV, bowel regimen  VTE - SQH Foley - none   HTN CAD CKD-IIIb Chronic hypercalcemia and hyperparathyroidism  LOS: 4 days   I reviewed nursing notes, hospitalist notes, last 24 h vitals and pain scores, and last 48 h  intake and output.  This care required low level of medical decision making.    Norm Parcel, [facility] 05/06/2021, 8:54 AM Please see Amion for pager number during day hours 7:00am-4:30pm

## 2021-05-07 ENCOUNTER — Telehealth: Payer: Self-pay

## 2021-05-07 LAB — BASIC METABOLIC PANEL
Anion gap: 5 (ref 5–15)
BUN: 36 mg/dL — ABNORMAL HIGH (ref 8–23)
CO2: 23 mmol/L (ref 22–32)
Calcium: 9.5 mg/dL (ref 8.9–10.3)
Chloride: 106 mmol/L (ref 98–111)
Creatinine, Ser: 2.2 mg/dL — ABNORMAL HIGH (ref 0.61–1.24)
GFR, Estimated: 28 mL/min — ABNORMAL LOW (ref >60)
Glucose, Bld: 90 mg/dL (ref 70–99)
Potassium: 4.5 mmol/L (ref 3.5–5.1)
Sodium: 134 mmol/L — ABNORMAL LOW (ref 135–145)

## 2021-05-07 LAB — URINALYSIS, ROUTINE W REFLEX MICROSCOPIC
Bilirubin Urine: NEGATIVE
Glucose, UA: NEGATIVE mg/dL
Hgb urine dipstick: NEGATIVE
Ketones, ur: NEGATIVE mg/dL
Leukocytes,Ua: NEGATIVE
Nitrite: NEGATIVE
Protein, ur: NEGATIVE mg/dL
Specific Gravity, Urine: 1.017 (ref 1.005–1.030)
pH: 5 (ref 5.0–8.0)

## 2021-05-07 LAB — MAGNESIUM: Magnesium: 1.9 mg/dL (ref 1.7–2.4)

## 2021-05-07 MED ORDER — NICOTINE 14 MG/24HR TD PT24
14.0000 mg | MEDICATED_PATCH | Freq: Every day | TRANSDERMAL | Status: DC
  Administered 2021-05-07 – 2021-05-12 (×6): 14 mg via TRANSDERMAL
  Filled 2021-05-07 (×7): qty 1

## 2021-05-07 NOTE — Progress Notes (Addendum)
VAST consulted to obtain IV access. Pt is currently not reciving IV meds/fluids and vital signs are stable. Pt is awaiting SNF placement or home health set-up.  Spoke with Mendel Ryder, pt's nurse; educated it is best practice not to place an IV that is not currently needed to decrease infection risk and allow for vein preservation. Further educated if pt's condition changes and IV access is needed emergently, IV team consult should be placed STAT with a comment as to why an emergent IV is needed. Mendel Ryder, pt's nurse verbalized understanding but stated physician was requesting IV access.  ?SecureChat sent to [facility], MD. She verbalized that patient needed some IV medications to calm him down yesterday. She responded "If you feel confident that your team or someone will be available to put stat IV line, even after hours, I am fine with holding it off for now."  ?VAST RN will go to patient's bedside to assess his vasculature before this shift ends to determine if IV needs to be placed or can hold off. MD and unit RN notified. ? ?1843- patient has visible veins in both arms if IV access becomes needed. Patient currently not in need of IV access. Unit RN Mendel Ryder aware and in agreement.  ?

## 2021-05-07 NOTE — TOC Progression Note (Addendum)
Transition of Care (TOC) - Progression Note  ? ? ?Patient Details  ?Name: [name] ?MRN: [id number] ?Date of Birth: Dec 02, 1930 ? ?Transition of Care (TOC) CM/SW Contact  ?Marilu Favre, RN ?Phone Number: ?05/07/2021, 8:46 AM ? ?Clinical Narrative:    ? ?Spoke to daughter [name] by phone. [name] requesting patient's aide hours to be increased. NCM explained [name] will need to call the agency he receives aides through and request increased hours. [name] has NCM direct cell phone number to give the agency if there are any questions.  ? ?NCM discussed patient's updated PT/OT evaluation for SNF. NCM explained patient will need to not require sitter for 24 hours prior to discharging to SNF .  ? ?Daughter in agreement with SNF and has no preferences on which SNF.  ? ?NCM called PT/OT department and left message on when patient would be seen again. Per notes they thought patient's decline may be partly due to medication. Awaiting call back. PT/OT will see patient again tomorrow. ? ?[name] aware and voiced understanding. ? ?Followed up with [name], she has called patient's aide agency and they will have PCP sign paperwork for request in increasing aide hours  ? ?Expected Discharge Plan: Home/Self Care ?Barriers to Discharge: Continued Medical Work up ? ?Expected Discharge Plan and Services ?Expected Discharge Plan: Home/Self Care ?  ?Discharge Planning Services: CM Consult ?  ?Living arrangements for the past 2 months: [place] ?                ?DME Arranged: N/A ?  ?  ?  ?  ?HH Arranged: NA ?  ?  ?  ?  ? ? ?Social Determinants of Health (SDOH) Interventions ?  ? ?Readmission Risk Interventions ?No flowsheet data found. ? ?

## 2021-05-07 NOTE — Telephone Encounter (Signed)
[name] called to let Dr Amil Amen know that she was told we would have to call his insurance [phone] and would have to fill out a [id number] form for change of medical status. [place] number is [phone] if we need to get in contact with them. ?

## 2021-05-07 NOTE — Progress Notes (Signed)
PROGRESS NOTE    [name]  BTD:[id number] DOB: Aug 09, 1930 DOA: 05/02/2021 PCP: Mack Hook, MD   Brief Narrative:  HPI: [name] is a 88 y.o. male with medical history significant of HTN, CKD stage IIIb, cecal colon CA status post right hemicolectomy, CAD s/p stenting, Stroke, hyperparathyroidism, came with worsening of right groin pain.   Patient has a chronic right inguinal hernia but always reducible.  Patient started to feel right groin pain about 3 days ago gradually getting worse, with cramping-like intermittent but became constant this morning associated with nausea but no vomiting.  Constipated since yesterday.   ED Course: Afebrile, no tachycardia no hypotension.  Physical exam showed nonreducible right inguinal hernia.  CT abdomen pelvis showed incarcerated right inguinal hernia with small bowel trapped inside the hernia sac.  Assessment & Plan:   Principal Problem:   Incarcerated right inguinal hernia Active Problems:   Coronary artery disease   Essential hypertension   Hyperlipidemia   CKD (chronic kidney disease), stage IV ([place])   Incarcerated inguinal hernia  Incarcerated right inguinal hernia -Status post surgical repair 05/02/2021.  Tolerating soft diet.  Cleared by general surgery for discharge.   Essential hypertension: Blood pressure fairly controlled.  Continue amlodipine, Imdur, Lopressor and losartan today but hold Lasix.   CAD: Asymptomatic.  We will continue aspirin and Lopressor.   CKD stage 4: Patient appears to have CKD stage IV and his creatinine is at baseline.  Patient does not have CKD stage IIIb as mentioned in previous notes.  Hyperlipidemia: Continue atorvastatin.  Delirium: Much better today.  Very alert.  Almost oriented.  He missed the year and the month but was able to answer all the questions.  No sitter at bedside.  We will continue nightly Seroquel.   1. Avoid benzodiazepines, antihistamines, anticholinergics, and  minimize opiate use as these may worsen delirium. 2: Assess, prevent and manage pain as lack of treatment can result in delirium.  3: Provide appropriate lighting and clear signage; a clock and calendar should be easily visible to the patient. 4: Monitor environmental factors. Reduce light and noise at night (close shades, turn off lights, turn off TV, ect). Correct any alterations in sleep cycle. 5: Reorient the patient to person, place, time and situation on each encounter.  6: Correct sensory deficits if possible (replace eye glasses, hearing aids, ect). 7: Avoid restraints if able. Severely delirious patients benefit from constant observation by a sitter.  Chronic hypercalcemia and hyperparathyroidism -Calcium level appears to be stable compared to baseline -Monitor level, if remains stable, likely can follow-up with PCP/endocrinology outpatient.  Placement: PT OT recommends SNF.  TOC working on that.  DVT prophylaxis: heparin injection 5,000 Units Start: 05/02/21 1515   Code Status: Full Code  Family Communication:  None present at bedside.  I spoke to his daughter over the phone.  Updated her.  Answered all questions.  She is very glad that we are pursuing SNF for him.  Status is: Inpatient Remains inpatient appropriate because: General surgery slowly advancing diet.   Estimated body mass index is 21.96 kg/m (pended) as calculated from the following:   Height as of this encounter: (P) 5' 5.5" (1.664 m).   Weight as of 04/16/21: 60.8 kg.    Nutritional Assessment: Body mass index is 21.96 kg/m (pended).. Seen by dietician.  I agree with the assessment and plan as outlined below: Nutrition Status:   Skin Assessment: I have examined the patient's skin and I agree with the wound assessment  as performed by the wound care RN as outlined below:    Consultants:  General surgery  Procedures:  As above  Antimicrobials:  Anti-infectives (From admission, onward)    Start      Dose/Rate Route Frequency Ordered Stop   05/03/21 0600  cefTRIAXone (ROCEPHIN) 2 g in sodium chloride 0.9 % 100 mL IVPB        2 g 200 mL/hr over 30 Minutes Intravenous On call to O.R. 05/02/21 1642 05/02/21 1755         Subjective: Seen and examined.  Alert.  No complaints. Objective: Vitals:   05/07/21 0013 05/07/21 0604 05/07/21 0813 05/07/21 0820  BP:  (!) 127/59 128/62 (!) 145/58  Pulse:  (!) 56 81 (!) 57  Resp:  18  17  Temp:  98 F (36.7 C)  98.2 F (36.8 C)  TempSrc:  Oral  Oral  SpO2:  100%  100%  Height: (P) 5' 5.5" (1.664 m)       Intake/Output Summary (Last 24 hours) at 05/07/2021 1137 Last data filed at 05/06/2021 1700 Gross per 24 hour  Intake 597 ml  Output --  Net 597 ml    There were no vitals filed for this visit.  Examination:  General exam: Appears calm and comfortable  Respiratory system: Clear to auscultation. Respiratory effort normal. Cardiovascular system: S1 & S2 heard, RRR. No JVD, murmurs, rubs, gallops or clicks. No pedal edema. Gastrointestinal system: Abdomen is nondistended, soft and nontender. No organomegaly or masses felt. Normal bowel sounds heard. Central nervous system: Alert and oriented x2. No focal neurological deficits. Extremities: Symmetric 5 x 5 power. Skin: No rashes, lesions or ulcers.   Data Reviewed: I have personally reviewed following labs and imaging studies  CBC: Recent Labs  Lab 05/02/21 1234 05/03/21 0227 05/04/21 0957 05/05/21 0124 05/06/21 0910  WBC 6.7 13.8* 7.5 7.2 4.8  NEUTROABS 4.8  --   --   --   --   HGB 13.9 12.3* 9.7* 9.9* 9.4*  HCT 43.7 38.2* 30.9* 31.3* 28.9*  MCV 88.5 88.2 88.8 88.7 86.8  PLT 202 198 145* 133* 123*    Basic Metabolic Panel: Recent Labs  Lab 05/03/21 0227 05/04/21 0957 05/05/21 0124 05/06/21 0910 05/07/21 0817  NA 137 133* 134* 135 134*  K 5.2* 4.5 4.4 4.4 4.5  CL 108 106 106 105 106  CO2 19* 21* 22 21* 23  GLUCOSE 138* 148* 83 85 90  BUN 37* 41* 38* 32* 36*   CREATININE 2.26* 2.44* 2.23* 2.03* 2.20*  CALCIUM 10.2 9.5 9.6 9.5 9.5  MG  --   --   --   --  1.9    GFR: CrCl cannot be calculated (Unknown ideal weight.). Liver Function Tests: Recent Labs  Lab 05/02/21 1234  AST 17  ALT 9  ALKPHOS 79  BILITOT 0.6  PROT 8.1  ALBUMIN 3.2*    Recent Labs  Lab 05/02/21 1234  LIPASE 29    No results for input(s): AMMONIA in the last 168 hours. Coagulation Profile: No results for input(s): INR, PROTIME in the last 168 hours. Cardiac Enzymes: No results for input(s): CKTOTAL, CKMB, CKMBINDEX, TROPONINI in the last 168 hours. BNP (last 3 results) No results for input(s): PROBNP in the last 8760 hours. HbA1C: No results for input(s): HGBA1C in the last 72 hours. CBG: No results for input(s): GLUCAP in the last 168 hours. Lipid Profile: No results for input(s): CHOL, HDL, LDLCALC, TRIG, CHOLHDL, LDLDIRECT in the last 72  hours. Thyroid Function Tests: No results for input(s): TSH, T4TOTAL, FREET4, T3FREE, THYROIDAB in the last 72 hours. Anemia Panel: No results for input(s): VITAMINB12, FOLATE, FERRITIN, TIBC, IRON, RETICCTPCT in the last 72 hours. Sepsis Labs: Recent Labs  Lab 05/02/21 1516 05/02/21 1644  LATICACIDVEN 2.2* 2.7*     Recent Results (from the past 240 hour(s))  Resp Panel by RT-PCR (Flu A&B, Covid) Nasopharyngeal Swab     Status: None   Collection Time: 05/02/21 12:31 PM   Specimen: Nasopharyngeal Swab; Nasopharyngeal(NP) swabs in vial transport medium  Result Value Ref Range Status   SARS Coronavirus 2 by RT PCR NEGATIVE NEGATIVE Final    Comment: (NOTE) SARS-CoV-2 target nucleic acids are NOT DETECTED.  The SARS-CoV-2 RNA is generally detectable in upper respiratory specimens during the acute phase of infection. The lowest concentration of SARS-CoV-2 viral copies this assay can detect is 138 copies/mL. A negative result does not preclude SARS-Cov-2 infection and should not be used as the sole basis for  treatment or other patient management decisions. A negative result may occur with  improper specimen collection/handling, submission of specimen other than nasopharyngeal swab, presence of viral mutation(s) within the areas targeted by this assay, and inadequate number of viral copies(<138 copies/mL). A negative result must be combined with clinical observations, patient history, and epidemiological information. The expected result is Negative.  Fact Sheet for Patients:  EntrepreneurPulse.com.au  Fact Sheet for Healthcare Providers:  IncredibleEmployment.be  This test is no t yet approved or cleared by the [place] FDA and  has been authorized for detection and/or diagnosis of SARS-CoV-2 by FDA under an Emergency Use Authorization (EUA). This EUA will remain  in effect (meaning this test can be used) for the duration of the COVID-19 declaration under Section 564(b)(1) of the Act, 21 U.S.C.section 360bbb-3(b)(1), unless the authorization is terminated  or revoked sooner.       Influenza A by PCR NEGATIVE NEGATIVE Final   Influenza B by PCR NEGATIVE NEGATIVE Final    Comment: (NOTE) The Xpert Xpress SARS-CoV-2/FLU/RSV plus assay is intended as an aid in the diagnosis of influenza from Nasopharyngeal swab specimens and should not be used as a sole basis for treatment. Nasal washings and aspirates are unacceptable for Xpert Xpress SARS-CoV-2/FLU/RSV testing.  Fact Sheet for Patients: EntrepreneurPulse.com.au  Fact Sheet for Healthcare Providers: IncredibleEmployment.be  This test is not yet approved or cleared by the [place] FDA and has been authorized for detection and/or diagnosis of SARS-CoV-2 by FDA under an Emergency Use Authorization (EUA). This EUA will remain in effect (meaning this test can be used) for the duration of the COVID-19 declaration under Section 564(b)(1) of the Act, 21  U.S.C. section 360bbb-3(b)(1), unless the authorization is terminated or revoked.  Performed at [facility], [place] [address] [place], [place] [address]       Radiology Studies: No results found.  Scheduled Meds:  acetaminophen  650 mg Oral Q6H   amLODipine  10 mg Oral Q supper   aspirin EC  81 mg Oral Q breakfast   atorvastatin  80 mg Oral Q supper   docusate sodium  100 mg Oral BID   feeding supplement  1 Container Oral BID BM   heparin  5,000 Units Subcutaneous Q12H   isosorbide mononitrate  120 mg Oral Q supper   losartan  100 mg Oral Q breakfast   metoprolol tartrate  25 mg Oral BID WC   polyethylene glycol  17 g Oral Daily  QUEtiapine  25 mg Oral Q24H   Continuous Infusions:   LOS: 5 days   Time spent: 28 minutes  Darliss Cheney, MD [facility]  05/07/2021, 11:37 AM  Please page via [facility] and do not message via secure chat for urgent patient care matters. Secure chat can be used for non urgent patient care matters.  How to contact the [facility] Attending or Consulting provider [place] or covering provider during after hours [place], for this patient?  Check the care team in [facility] and look for a) attending/consulting [facility] provider listed and b) the [facility] team listed. Page or secure chat [phone]. Log into [email or web address] and use [place]'s universal password to access. If you do not have the password, please contact the hospital operator. Locate the [facility] provider you are looking for under [facility] and page to a number that you can be directly reached. If you still have difficulty reaching the provider, please page the [facility] (Director on Call) for the Hospitalists listed on amion for assistance.

## 2021-05-08 MED ORDER — HYDRALAZINE HCL 50 MG PO TABS
100.0000 mg | ORAL_TABLET | Freq: Two times a day (BID) | ORAL | Status: DC
  Administered 2021-05-08 – 2021-05-12 (×8): 100 mg via ORAL
  Filled 2021-05-08 (×8): qty 2

## 2021-05-08 NOTE — Progress Notes (Signed)
Physical Therapy Treatment ?Patient Details ?Name: [name] ?MRN: [id number] ?DOB: 08-Apr-1930 ?Today's Date: 05/08/2021 ? ? ?History of Present Illness Pt is 88 y.o. male presents to [facility] hospital on 05/02/2021 with R groin pain. CT abdomen pelvis showed incarcerated right inguinal hernia with small bowel trapped inside the hernia sac. Pt underwent open R inguinal hernia repair on 2/24. PMH includes HTN, CKD stage IIIb, cecal colon CA status post right hemicolectomy, CAD s/p stenting, Stroke, hyperparathyroidism. ? ?  ?PT Comments  ? ? Pt demonstrated weakness with hip flexion bil with isolated strength of LE with MMT on EOB b. Pt was min assist with sit to stand and Min Assist with walking with the Rolator with gait deficits noted. Pt was educated on safe use of rollator. During treatment session patient showed deficits in strength, endurance, activity tolerance. Recommending therapy services at skilled nursing facility to address the previously stated deficits. Will continue to follow acutely to maximize functional mobility, independence and safety. ?  ?Recommendations for follow up therapy are one component of a multi-disciplinary discharge planning process, led by the attending physician.  Recommendations may be updated based on patient status, additional functional criteria and insurance authorization. ? ?Follow Up Recommendations ? Skilled nursing-short term rehab (<3 hours/day) ?  ?  ?Assistance Recommended at Discharge Frequent or constant Supervision/Assistance  ?Patient can return home with the following A lot of help with bathing/dressing/bathroom;A lot of help with walking and/or transfers;Help with stairs or ramp for entrance;Assist for transportation;Direct supervision/assist for medications management;Direct supervision/assist for financial management;Assistance with cooking/housework ?  ?Equipment Recommendations ? None recommended by PT  ?  ?Recommendations for Other Services   ? ? ?  ?Precautions /  Restrictions Precautions ?Precautions: Fall ?Restrictions ?Weight Bearing Restrictions: No  ?  ? ?Mobility ? Bed Mobility ?Overal bed mobility: Modified Independent ?Bed Mobility: Rolling, Supine to Sit, Sit to Supine ?Rolling: Modified independent (Device/Increase time) ?  ?Supine to sit: Modified independent (Device/Increase time) ?Sit to supine: Modified independent (Device/Increase time) ?  ?General bed mobility comments: Pt required increased time and use of rails to sit EOB. ?  ? ?Transfers ?Overall transfer level: Needs assistance ?Equipment used: Rollator (4 wheels) ?Transfers: Sit to/from Stand ?Sit to Stand: Min assist ?  ?  ?  ?  ?  ?General transfer comment: Performed sit to stant with min assist and used rollator for UE support. Pt was educated on use to sit and stand off the rollator. Initially pt started sitting before the seat of the rollator was under him and he started turing before he stood up. Pt was cued to turn all the way around so that the seat was under him before sitting down and standing all the way before turing around to continue walking with the rollator. ?  ? ?Ambulation/Gait ?Ambulation/Gait assistance: Mod assist ?Gait Distance (Feet): 70 Feet (x2 (seated rest on rollator)) ?Assistive device: Rollator (4 wheels) ?Gait Pattern/deviations: Step-to pattern, Decreased stride length, Decreased stance time - right, Decreased weight shift to right, Decreased dorsiflexion - right, Shuffle, Trunk flexed ?  ?  ?  ?General Gait Details: Pt required min to mod assist for walking with the rollator due to unstreadiness on feet. Pt reported gait not feeling normal. ? ? ?Stairs ?  ?  ?  ?  ?  ? ? ?Wheelchair Mobility ?  ? ?Modified Rankin (Stroke Patients Only) ?  ? ? ?  ?Balance Overall balance assessment: Needs assistance ?Sitting-balance support: No upper extremity supported, Feet supported ?Sitting balance-Leahy  Scale: Fair ?  ?  ?Standing balance support: Bilateral upper extremity supported,  During functional activity, Reliant on assistive device for balance ?Standing balance-Leahy Scale: Poor ?Standing balance comment: Pt was modified independent with static standing with rollator but demonstrated increase sway with ambulation and sit to stand that required up to mod assist to correct. ?  ?  ?  ?  ?  ?  ?  ?  ?  ?  ?  ?  ? ?  ?Cognition Arousal/Alertness: Awake/alert ?Behavior During Therapy: Impulsive ?Overall Cognitive Status: Impaired/Different from baseline ?  ?  ?  ?  ?  ?  ?  ?  ?  ?  ?  ?  ?  ?Safety/Judgement: Decreased awareness of safety, Decreased awareness of deficits ?  ?Problem Solving: Slow processing, Decreased initiation ?General Comments: Pt was impulsive with movements, he tried to rush movements and was cued to slow down to maintain safety. ?  ?  ? ?  ?Exercises   ? ?  ?General Comments   ?  ?  ? ?Pertinent Vitals/Pain Pain Assessment ?Faces Pain Scale: Hurts a little bit ?Pain Location: Pt reported no pain upon entry into the room but had some pain with movement that did not seem to limit activity. ?Pain Descriptors / Indicators: Discomfort, Grimacing, Guarding  ? ? ?Home Living   ?  ?  ?  ?  ?  ?  ?  ?  ?  ?   ?  ?Prior Function    ?  ?  ?   ? ?PT Goals (current goals can now be found in the care plan section) Acute Rehab PT Goals ?PT Goal Formulation: With patient/family ?Time For Goal Achievement: 05/18/21 ?Progress towards PT goals: Progressing toward goals ? ?  ?Frequency ? ? ? Min 3X/week ? ? ? ?  ?PT Plan Current plan remains appropriate  ? ? ?Co-evaluation   ?  ?  ?  ?  ? ?  ?AM-PAC PT "6 Clicks" Mobility   ?Outcome Measure ? Help needed turning from your back to your side while in a flat bed without using bedrails?: A Little ?Help needed moving from lying on your back to sitting on the side of a flat bed without using bedrails?: A Little ?Help needed moving to and from a bed to a chair (including a wheelchair)?: A Lot ?Help needed standing up from a chair using your arms  (e.g., wheelchair or bedside chair)?: A Little ?Help needed to walk in hospital room?: A Little ?Help needed climbing 3-5 steps with a railing? : Total ?6 Click Score: 15 ? ?  ?End of Session Equipment Utilized During Treatment: Gait belt ?Activity Tolerance: Patient tolerated treatment well ?Patient left: with chair alarm set;in chair;with call bell/phone within reach;with family/visitor present ?  ?PT Visit Diagnosis: Other abnormalities of gait and mobility (R26.89);Muscle weakness (generalized) (M62.81) ?  ? ? ?Time: 2993-7169 ?PT Time Calculation (min) (ACUTE ONLY): 30 min ? ?Charges:  $Gait Training: 8-22 mins ?$Therapeutic Activity: 8-22 mins          ?          ? ?Quenton Fetter, SPT ? ? ? ?Quenton Fetter ?05/08/2021, 1:56 PM ? ?

## 2021-05-08 NOTE — Progress Notes (Signed)
1600- This evening patient began trying to get out of bed. When asked where he was going, patient replied "I am going to the kitchen." RN re oriented patient to situation and location. Patient still intent on getting up and going to the kitchen for several minutes. Patient finally calmed down after talking for a while and said he would watch some TV for now. ?

## 2021-05-08 NOTE — Progress Notes (Signed)
PROGRESS NOTE    [name]  KGM:[id number] DOB: [birth year] DOA: 05/02/2021 PCP: Mack Hook, MD   Brief Narrative:  HPI: [name] is a 88 y.o. male with medical history significant of HTN, CKD stage IIIb, cecal colon CA status post right hemicolectomy, CAD s/p stenting, Stroke, hyperparathyroidism, came with worsening of right groin pain.   Patient has a chronic right inguinal hernia but always reducible.  Patient started to feel right groin pain about 3 days ago gradually getting worse, with cramping-like intermittent but became constant this morning associated with nausea but no vomiting.  Constipated since yesterday.   ED Course: Afebrile, no tachycardia no hypotension.  Physical exam showed nonreducible right inguinal hernia.  CT abdomen pelvis showed incarcerated right inguinal hernia with small bowel trapped inside the hernia sac.  Assessment & Plan:   Principal Problem:   Incarcerated right inguinal hernia Active Problems:   Coronary artery disease   Essential hypertension   Hyperlipidemia   CKD (chronic kidney disease), stage IV ([place])   Incarcerated inguinal hernia  Incarcerated right inguinal hernia -Status post surgical repair 05/02/2021.  Tolerating soft diet.  Cleared by general surgery for discharge.   Essential hypertension: Pressure slightly elevated.  Continue amlodipine, Imdur, Lopressor and losartan and resume hydralazine.   CAD: Asymptomatic.  We will continue aspirin and Lopressor.   CKD stage 4: Patient appears to have CKD stage IV and his creatinine is at baseline.  Patient does not have CKD stage IIIb as mentioned in previous notes.  Hyperlipidemia: Continue atorvastatin.  Delirium: Resolved.  Fully alert and oriented.  Continue nightly Seroquel.  Chronic hypercalcemia and hyperparathyroidism -Calcium level appears to be stable compared to baseline -Monitor level, if remains stable, likely can follow-up with PCP/endocrinology  outpatient.  Placement: PT OT recommends SNF.  TOC working on that.  DVT prophylaxis: heparin injection 5,000 Units Start: 05/02/21 1515   Code Status: Full Code  Family Communication:  None present at bedside.    Status is: Inpatient Remains inpatient appropriate because: General surgery slowly advancing diet.   Estimated body mass index is 21.96 kg/m (pended) as calculated from the following:   Height as of this encounter: (P) 5' 5.5" (1.664 m).   Weight as of 04/16/21: 60.8 kg.    Nutritional Assessment: Body mass index is 21.96 kg/m (pended).. Seen by dietician.  I agree with the assessment and plan as outlined below: Nutrition Status:   Skin Assessment: I have examined the patient's skin and I agree with the wound assessment as performed by the wound care RN as outlined below:    Consultants:  General surgery  Procedures:  As above  Antimicrobials:  Anti-infectives (From admission, onward)    Start     Dose/Rate Route Frequency Ordered Stop   05/03/21 0600  cefTRIAXone (ROCEPHIN) 2 g in sodium chloride 0.9 % 100 mL IVPB        2 g 200 mL/hr over 30 Minutes Intravenous On call to O.R. 05/02/21 1642 05/02/21 1755         Subjective:  Seen and examined.  He has no complaints.  He is fully alert and oriented.  He keeps asking when he will be discharged to the facility.  Objective: Vitals:   05/07/21 2109 05/08/21 0404 05/08/21 0824 05/08/21 0846  BP: (!) 142/58 (!) 167/86  (!) 187/89  Pulse: (!) 56 (!) 57 70 63  Resp: 17 17  18   Temp: 98.7 F (37.1 C) 97.7 F (36.5 C)  97.9 F (36.6  C)  TempSrc: Oral Oral  Oral  SpO2: 100% 100%  100%  Height:        Intake/Output Summary (Last 24 hours) at 05/08/2021 1420 Last data filed at 05/08/2021 0405 Gross per 24 hour  Intake 240 ml  Output 1350 ml  Net -1110 ml    There were no vitals filed for this visit.  Examination:  General exam: Appears calm and comfortable  Respiratory system: Clear to  auscultation. Respiratory effort normal. Cardiovascular system: S1 & S2 heard, RRR. No JVD, murmurs, rubs, gallops or clicks. No pedal edema. Gastrointestinal system: Abdomen is nondistended, soft and nontender. No organomegaly or masses felt. Normal bowel sounds heard. Central nervous system: Alert and oriented. No focal neurological deficits. Extremities: Symmetric 5 x 5 power. Skin: No rashes, lesions or ulcers.  Psychiatry: Judgement and insight appear normal. Mood & affect appropriate.   Data Reviewed: I have personally reviewed following labs and imaging studies  CBC: Recent Labs  Lab 05/02/21 1234 05/03/21 0227 05/04/21 0957 05/05/21 0124 05/06/21 0910  WBC 6.7 13.8* 7.5 7.2 4.8  NEUTROABS 4.8  --   --   --   --   HGB 13.9 12.3* 9.7* 9.9* 9.4*  HCT 43.7 38.2* 30.9* 31.3* 28.9*  MCV 88.5 88.2 88.8 88.7 86.8  PLT 202 198 145* 133* 123*    Basic Metabolic Panel: Recent Labs  Lab 05/03/21 0227 05/04/21 0957 05/05/21 0124 05/06/21 0910 05/07/21 0817  NA 137 133* 134* 135 134*  K 5.2* 4.5 4.4 4.4 4.5  CL 108 106 106 105 106  CO2 19* 21* 22 21* 23  GLUCOSE 138* 148* 83 85 90  BUN 37* 41* 38* 32* 36*  CREATININE 2.26* 2.44* 2.23* 2.03* 2.20*  CALCIUM 10.2 9.5 9.6 9.5 9.5  MG  --   --   --   --  1.9    GFR: CrCl cannot be calculated (Unknown ideal weight.). Liver Function Tests: Recent Labs  Lab 05/02/21 1234  AST 17  ALT 9  ALKPHOS 79  BILITOT 0.6  PROT 8.1  ALBUMIN 3.2*    Recent Labs  Lab 05/02/21 1234  LIPASE 29    No results for input(s): AMMONIA in the last 168 hours. Coagulation Profile: No results for input(s): INR, PROTIME in the last 168 hours. Cardiac Enzymes: No results for input(s): CKTOTAL, CKMB, CKMBINDEX, TROPONINI in the last 168 hours. BNP (last 3 results) No results for input(s): PROBNP in the last 8760 hours. HbA1C: No results for input(s): HGBA1C in the last 72 hours. CBG: No results for input(s): GLUCAP in the last 168  hours. Lipid Profile: No results for input(s): CHOL, HDL, LDLCALC, TRIG, CHOLHDL, LDLDIRECT in the last 72 hours. Thyroid Function Tests: No results for input(s): TSH, T4TOTAL, FREET4, T3FREE, THYROIDAB in the last 72 hours. Anemia Panel: No results for input(s): VITAMINB12, FOLATE, FERRITIN, TIBC, IRON, RETICCTPCT in the last 72 hours. Sepsis Labs: Recent Labs  Lab 05/02/21 1516 05/02/21 1644  LATICACIDVEN 2.2* 2.7*     Recent Results (from the past 240 hour(s))  Resp Panel by RT-PCR (Flu A&B, Covid) Nasopharyngeal Swab     Status: None   Collection Time: 05/02/21 12:31 PM   Specimen: Nasopharyngeal Swab; Nasopharyngeal(NP) swabs in vial transport medium  Result Value Ref Range Status   SARS Coronavirus 2 by RT PCR NEGATIVE NEGATIVE Final    Comment: (NOTE) SARS-CoV-2 target nucleic acids are NOT DETECTED.  The SARS-CoV-2 RNA is generally detectable in upper respiratory specimens during the acute  phase of infection. The lowest concentration of SARS-CoV-2 viral copies this assay can detect is 138 copies/mL. A negative result does not preclude SARS-Cov-2 infection and should not be used as the sole basis for treatment or other patient management decisions. A negative result may occur with  improper specimen collection/handling, submission of specimen other than nasopharyngeal swab, presence of viral mutation(s) within the areas targeted by this assay, and inadequate number of viral copies(<138 copies/mL). A negative result must be combined with clinical observations, patient history, and epidemiological information. The expected result is Negative.  Fact Sheet for Patients:  EntrepreneurPulse.com.au  Fact Sheet for Healthcare Providers:  IncredibleEmployment.be  This test is no t yet approved or cleared by the [place] FDA and  has been authorized for detection and/or diagnosis of SARS-CoV-2 by FDA under an Emergency Use  Authorization (EUA). This EUA will remain  in effect (meaning this test can be used) for the duration of the COVID-19 declaration under Section 564(b)(1) of the Act, 21 U.S.C.section 360bbb-3(b)(1), unless the authorization is terminated  or revoked sooner.       Influenza A by PCR NEGATIVE NEGATIVE Final   Influenza B by PCR NEGATIVE NEGATIVE Final    Comment: (NOTE) The Xpert Xpress SARS-CoV-2/FLU/RSV plus assay is intended as an aid in the diagnosis of influenza from Nasopharyngeal swab specimens and should not be used as a sole basis for treatment. Nasal washings and aspirates are unacceptable for Xpert Xpress SARS-CoV-2/FLU/RSV testing.  Fact Sheet for Patients: EntrepreneurPulse.com.au  Fact Sheet for Healthcare Providers: IncredibleEmployment.be  This test is not yet approved or cleared by the [place] FDA and has been authorized for detection and/or diagnosis of SARS-CoV-2 by FDA under an Emergency Use Authorization (EUA). This EUA will remain in effect (meaning this test can be used) for the duration of the COVID-19 declaration under Section 564(b)(1) of the Act, 21 U.S.C. section 360bbb-3(b)(1), unless the authorization is terminated or revoked.  Performed at [facility], [place] [address] [place], [place] [address]       Radiology Studies: No results found.  Scheduled Meds:  acetaminophen  650 mg Oral Q6H   amLODipine  10 mg Oral Q supper   aspirin EC  81 mg Oral Q breakfast   atorvastatin  80 mg Oral Q supper   docusate sodium  100 mg Oral BID   feeding supplement  1 Container Oral BID BM   heparin  5,000 Units Subcutaneous Q12H   isosorbide mononitrate  120 mg Oral Q supper   losartan  100 mg Oral Q breakfast   metoprolol tartrate  25 mg Oral BID WC   nicotine  14 mg Transdermal Daily   polyethylene glycol  17 g Oral Daily   QUEtiapine  25 mg Oral Q24H   Continuous Infusions:   LOS: 6 days   Time  spent: 25 minutes  Darliss Cheney, MD [facility]  05/08/2021, 2:20 PM  Please page via Amion and do not message via secure chat for urgent patient care matters. Secure chat can be used for non urgent patient care matters.  How to contact the [facility] Attending or Consulting provider [place] or covering provider during after hours [place], for this patient?  Check the care team in [facility] and look for a) attending/consulting TRH provider listed and b) the [facility] team listed. Page or secure chat [phone]. Log into [email or web address] and use 's universal password to access. If you do not have the password, please  contact the hospital operator. Locate the [facility] provider you are looking for under [facility] and page to a number that you can be directly reached. If you still have difficulty reaching the provider, please page the [facility] (Director on Call) for the Hospitalists listed on amion for assistance.

## 2021-05-08 NOTE — Progress Notes (Signed)
Occupational Therapy Treatment ?Patient Details ?Name: [name] ?MRN: [id number] ?DOB: 1930-09-08 ?Today's Date: 05/08/2021 ? ? ?History of present illness Pt is 88 y.o. male presents to [facility] hospital on 05/02/2021 with R groin pain. CT abdomen pelvis showed incarcerated right inguinal hernia with small bowel trapped inside the hernia sac. Pt underwent open R inguinal hernia repair on 2/24. 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  ?OT comments ? Pt sitting up in recliner with sister present.  Pt able to don/doff socks with supervision and encouragement.  He required mod A for standing balance and functional tranfers due to ataxia.  He is easily distracted and required cues for orientation.   ? ?Recommendations for follow up therapy are one component of a multi-disciplinary discharge planning process, led by the attending physician.  Recommendations may be updated based on patient status, additional functional criteria and insurance authorization. ?   ?Follow Up Recommendations ? Skilled nursing-short term rehab (<3 hours/day)  ?  ?Assistance Recommended at Discharge Frequent or constant Supervision/Assistance  ?Patient can return home with the following ? A lot of help with walking and/or transfers;A lot of help with bathing/dressing/bathroom;Assistance with cooking/housework;Assist for transportation;Help with stairs or ramp for entrance ?  ?Equipment Recommendations ? BSC/3in1  ?  ?Recommendations for Other Services   ? ?  ?Precautions / Restrictions Precautions ?Precautions: Fall ?Restrictions ?Weight Bearing Restrictions: No  ? ? ?  ? ?Mobility Bed Mobility ?  ?  ?  ?  ?  ?  ?  ?  ?  ? ?Transfers ?  ?  ?  ?  ?  ?  ?  ?  ?  ?  ?  ?  ?Balance Overall balance assessment: Needs assistance ?Sitting-balance support: No upper extremity supported, Feet supported ?Sitting balance-Leahy Scale:  Good ?  ?  ?Standing balance support: Bilateral upper extremity supported, During functional activity, Reliant on assistive device for balance ?Standing balance-Leahy Scale: Poor ?  ?  ?  ?  ?  ?  ?  ?  ?  ?  ?  ?  ?   ? ?ADL either performed or assessed with clinical judgement  ? ?ADL Overall ADL's : Needs assistance/impaired ?  ?  ?  ?  ?  ?  ?Lower Body Bathing: Moderate assistance;Sit to/from stand ?Lower Body Bathing Details (indicate cue type and reason): assist to access feet and assist for standing balance ?  ?  ?Lower Body Dressing: Moderate assistance;Sit to/from stand ?Lower Body Dressing Details (indicate cue type and reason): able to don/doff socks with supervision.  Mod A for sit to stand and standing balance ?Toilet Transfer: Moderate assistance;Stand-pivot;BSC/3in1;Rolling walker (2 wheels) ?  ?  ?  ?  ?  ?Functional mobility during ADLs: Moderate assistance ?  ?  ? ?Extremity/Trunk Assessment Upper Extremity Assessment ?Upper Extremity Assessment: Generalized weakness ?  ?Lower Extremity Assessment ?Lower Extremity Assessment: Defer to PT evaluation ?  ?  ?  ? ?Vision   ?  ?  ?Perception   ?  ?Praxis   ?  ? ?Cognition Arousal/Alertness: Awake/alert ?Behavior During Therapy: [facility] for tasks assessed/performed ?Overall Cognitive Status: Impaired/Different from baseline ?Area of Impairment: Orientation, Attention, Memory, Following commands, Safety/judgement, Awareness, Problem solving ?  ?  ?  ?  ?  ?  ?  ?  ?Orientation Level: Disoriented to, Time ?Current Attention Level: Selective ?Memory: Decreased short-term memory ?Following  Commands: Follows one step commands consistently ?Safety/Judgement: Decreased awareness of safety ?Awareness: Emergent ?Problem Solving: Requires verbal cues ?General Comments: Pt reported it is Feb and 2003.  He is easily distracted and requires redirection to task.  Impaired safety awareness ?  ?  ?   ?Exercises   ? ?  ?Shoulder Instructions   ? ? ?  ?General Comments sister  present  ? ? ?Pertinent Vitals/ Pain       Pain Assessment ?Pain Assessment: Faces ?Faces Pain Scale: Hurts a little bit ?Pain Location: surgical site ?Pain Descriptors / Indicators: Operative site guarding ?Pain Intervention(s): Monitored during session ? ?Home Living   ?  ?  ?  ?  ?  ?  ?  ?  ?  ?  ?  ?  ?  ?  ?  ?  ?  ?  ? ?  ?Prior Functioning/Environment    ?  ?  ?  ?   ? ?Frequency ? Min 2X/week  ? ? ? ? ?  ?Progress Toward Goals ? ?OT Goals(current goals can now be found in the care plan section) ? Progress towards OT goals: Progressing toward goals ? ?   ?Plan Frequency remains appropriate;Discharge plan needs to be updated   ? ?Co-evaluation ? ? ?   ?  ?  ?  ?  ? ?  ?AM-PAC OT "6 Clicks" Daily Activity     ?Outcome Measure ? ? Help from another person eating meals?: None ?Help from another person taking care of personal grooming?: A Little ?Help from another person toileting, which includes using toliet, bedpan, or urinal?: A Lot ?Help from another person bathing (including washing, rinsing, drying)?: A Lot ?Help from another person to put on and taking off regular upper body clothing?: A Little ?Help from another person to put on and taking off regular lower body clothing?: A Lot ?6 Click Score: 16 ? ?  ?End of Session Equipment Utilized During Treatment: Rolling walker (2 wheels) ? ?OT Visit Diagnosis: Muscle weakness (generalized) (M62.81);Other abnormalities of gait and mobility (R26.89);Unsteadiness on feet (R26.81) ?  ?Activity Tolerance Patient tolerated treatment well ?  ?Patient Left in chair;with call bell/phone within reach;with chair alarm set;with family/visitor present ?  ?Nurse Communication Mobility status ?  ? ?   ? ?Time: 1638-4665 ?OT Time Calculation (min): 24 min ? ?Charges: OT General Charges ?$OT Visit: 1 Visit ?OT Treatments ?$Self Care/Home Management : 23-37 mins ? ?Sailor Haughn C., OTR/L ?Acute Rehabilitation Services ?Pager [phone] ?Office [phone] ? ? ?Lucille Passy  M ?05/08/2021, 2:59 PM ?

## 2021-05-08 NOTE — [place] FL2 (Signed)
?[place] MEDICAID FL2 LEVEL OF CARE SCREENING TOOL  ?  ? ?IDENTIFICATION  ?Patient Name: ?[name] Birthdate: 04-22-1930 Sex: male Admission Date (Current Location): ?05/02/2021  ?[place] and [place] Number: ? [place] ?  Facility and Address:  ?The . [facility], [place] [address], [place], [place] [address] ?     Provider Number: ?[id number]  ?Attending Physician Name and Address:  ?Darliss Cheney, MD ? Relative Name and Phone Number:  ?  ?   ?Current Level of Care: ?  Recommended Level of Care: ?[place] Prior Approval Number: ?  ? ?Date Approved/Denied: ?  PASRR Number: ?Pending ? ?Discharge Plan: ?SNF ?  ? ?Current Diagnoses: ?Patient Active Problem List  ? Diagnosis Date Noted  ? Incarcerated inguinal hernia 05/02/2021  ? Incarcerated right inguinal hernia 05/02/2021  ? 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 Right inguinal hernia 11/29/2019  ? Decreased hearing, bilateral 11/29/2019  ? Stage 3b chronic kidney disease ([place]) 08/29/2019  ? Hyperparathyroidism ([place]) 06/20/2019  ? Atypical chest pain 01/20/2018  ? Right carotid bruit 07/07/2017  ? Tobacco abuse 05/26/2017  ? Chest pain 04/27/2016  ? Unstable angina ([place]) 04/26/2016  ? Cerebellar ataxia ([place]) 10/02/2015  ? Ataxic gait 09/18/2015  ? History of prostate cancer 09/18/2015  ? Prediabetes 07/29/2015  ? CKD (chronic kidney disease), stage IV ([place]) 04/19/2015  ? Chronic diastolic HF (heart failure) ([place])   ? COPD (chronic obstructive pulmonary disease) ([place]) 02/10/2014  ? Coronary artery disease 01/25/2013  ? Essential hypertension 01/25/2013  ? Hyperlipidemia 01/25/2013  ? History of colon cancer 05/19/2006  ? Kidney stones 1954  ? ? ?Orientation RESPIRATION BLADDER Height & Weight   ?  ?Self, Time, Place ? Normal Incontinent Weight:   ?Height:  (P) 5' 5.5" (166.4 cm)  ?BEHAVIORAL SYMPTOMS/MOOD NEUROLOGICAL BOWEL NUTRITION STATUS  ?    Continent Diet  ?AMBULATORY STATUS COMMUNICATION OF NEEDS Skin   ?Limited  Assist Verbally Normal, Surgical wounds ?  ?  ?  ?    ?     ?     ? ? ?Personal Care Assistance Level of Assistance  ?Bathing, Feeding, Dressing Bathing Assistance: Independent ?Feeding assistance: Independent ?Dressing Assistance: Independent ?   ? ?Functional Limitations Info  ?    ?  ?   ? ? ?SPECIAL CARE FACTORS FREQUENCY  ?PT (By licensed PT), OT (By licensed OT)   ?  ?PT Frequency: 5x a week ?OT Frequency: 5x a week ?  ?  ?  ?   ? ? ?Contractures Contractures Info: Not present  ? ? ?Additional Factors Info  ?Code Status, Allergies Code Status Info: full ?Allergies Info: nka ?  ?  ?  ?   ? ?Current Medications (05/08/2021):  This is the current hospital active medication list ?Current Facility-Administered Medications  ?Medication Dose Route Frequency Provider Last Rate Last Admin  ? acetaminophen (TYLENOL) tablet 650 mg  650 mg Oral Q6H Meuth, Brooke A, PA-C   650 mg at 05/08/21 1506  ? amLODipine (NORVASC) tablet 10 mg  10 mg Oral Q supper Darliss Cheney, MD   10 mg at 05/07/21 1721  ? aspirin EC tablet 81 mg  81 mg Oral Q breakfast Darliss Cheney, MD   81 mg at 05/08/21 7673  ? atorvastatin (LIPITOR) tablet 80 mg  80 mg Oral Q supper Darliss Cheney, MD   80 mg at 05/07/21 1721  ? docusate sodium (COLACE) capsule 100 mg  100 mg Oral  BID Norm Parcel, PA-C   100 mg at 05/08/21 4174  ? feeding supplement (BOOST / RESOURCE BREEZE) liquid 1 Container  1 Container Oral BID BM Meuth, Brooke A, PA-C   1 Container at 05/08/21 1217  ? heparin injection 5,000 Units  5,000 Units Subcutaneous Q12H Wynetta Fines T, MD   5,000 Units at 05/08/21 0814  ? hydrALAZINE (APRESOLINE) injection 5 mg  5 mg Intravenous Q6H PRN Wynetta Fines T, MD   5 mg at 05/08/21 1530  ? hydrALAZINE (APRESOLINE) tablet 100 mg  100 mg Oral BID WC Darliss Cheney, MD      ? isosorbide mononitrate (IMDUR) 24 hr tablet 120 mg  120 mg Oral Q supper Darliss Cheney, MD   120 mg at 05/07/21 1721  ? losartan (COZAAR) tablet 100 mg  100 mg Oral Q breakfast Darliss Cheney, MD   100 mg at 05/08/21 4818  ? methocarbamol (ROBAXIN) tablet 500 mg  500 mg Oral Q6H PRN Meuth, Brooke A, PA-C      ? metoprolol tartrate (LOPRESSOR) tablet 25 mg  25 mg Oral BID WC Darliss Cheney, MD   25 mg at 05/08/21 0824  ? morphine (PF) 2 MG/ML injection 2 mg  2 mg Intravenous Q6H PRN Meuth, Brooke A, PA-C      ? nicotine (NICODERM CQ - dosed in mg/24 hours) patch 14 mg  14 mg Transdermal Daily Darliss Cheney, MD   14 mg at 05/08/21 0915  ? ondansetron (ZOFRAN) tablet 4 mg  4 mg Oral Q6H PRN Wynetta Fines T, MD      ? Or  ? ondansetron [facility]) injection 4 mg  4 mg Intravenous Q6H PRN Wynetta Fines T, MD      ? oxyCODONE (Oxy IR/ROXICODONE) immediate release tablet 2.5-5 mg  2.5-5 mg Oral Q4H PRN Meuth, Brooke A, PA-C   5 mg at 05/07/21 1628  ? polyethylene glycol (MIRALAX / GLYCOLAX) packet 17 g  17 g Oral Daily Norm Parcel, PA-C   17 g at 05/08/21 0830  ? QUEtiapine (SEROQUEL) tablet 25 mg  25 mg Oral Q24H Darliss Cheney, MD   25 mg at 05/07/21 2150  ? ? ? ?Discharge Medications: ?Please see discharge summary for a list of discharge medications. ? ?Relevant Imaging Results: ? ?Relevant Lab Results: ? ? ?Additional Information ?SSN [id number] covid vax x2 ? ?Emeterio Reeve, LCSW ? ? ? ? ?

## 2021-05-08 NOTE — TOC Progression Note (Signed)
Transition of Care (TOC) - Progression Note  ? ? ?Patient Details  ?Name: [name] ?MRN: [id number] ?Date of Birth: Jun 09, 1930 ? ?Transition of Care (TOC) CM/SW Contact  ?Marilu Favre, RN ?Phone Number: ?05/08/2021, 10:20 AM ? ?Clinical Narrative:    ? ?Patient's  daughter [name] called NCM this morning to discuss SNF placement .  ? ?NCM discussed again that per progression reports patient is improving. PT/OT assigned to work with her dad again today. Awaiting updated recommendation. [name] would like to speak with PT/OT . NCM messaged PT/OT assigned to patient with request for them to call [name] and [place] Code number  ? ?Expected Discharge Plan: Home/Self Care ?Barriers to Discharge: Continued Medical Work up ? ?Expected Discharge Plan and Services ?Expected Discharge Plan: Home/Self Care ?  ?Discharge Planning Services: CM Consult ?  ?Living arrangements for the past 2 months: [place] ?                ?DME Arranged: N/A ?  ?  ?  ?  ?HH Arranged: NA ?  ?  ?  ?  ? ? ?Social Determinants of Health (SDOH) Interventions ?  ? ?Readmission Risk Interventions ?No flowsheet data found. ? ?

## 2021-05-09 MED ORDER — HALOPERIDOL LACTATE 5 MG/ML IJ SOLN
1.0000 mg | Freq: Once | INTRAMUSCULAR | Status: AC
  Administered 2021-05-09: 1 mg via INTRAMUSCULAR
  Filled 2021-05-09: qty 1

## 2021-05-09 NOTE — Progress Notes (Addendum)
PROGRESS NOTE    [name]  DGL:[id number] DOB: Jul 26, 1930 DOA: 05/02/2021 PCP: Mack Hook, MD   Brief Narrative:  HPI: [name] is a 88 y.o. male with medical history significant of HTN, CKD stage IIIb, cecal colon CA status post right hemicolectomy, CAD s/p stenting, Stroke, hyperparathyroidism, came with worsening of right groin pain.   Patient has a chronic right inguinal hernia but always reducible.  Patient started to feel right groin pain about 3 days ago gradually getting worse, with cramping-like intermittent but became constant this morning associated with nausea but no vomiting.  Constipated since yesterday.   ED Course: Afebrile, no tachycardia no hypotension.  Physical exam showed nonreducible right inguinal hernia.  CT abdomen pelvis showed incarcerated right inguinal hernia with small bowel trapped inside the hernia sac.  Assessment & Plan:   Principal Problem:   Incarcerated right inguinal hernia Active Problems:   Coronary artery disease   Essential hypertension   Hyperlipidemia   CKD (chronic kidney disease), stage IV ([place])   Incarcerated inguinal hernia  Incarcerated right inguinal hernia -Status post surgical repair 05/02/2021.  Tolerating soft diet.  Cleared by general surgery for discharge.   Essential hypertension: Blood pressure controlled.  Continue amlodipine, Imdur, Lopressor and losartan and resume hydralazine.   CAD: Asymptomatic.  We will continue aspirin and Lopressor.   CKD stage 4: Patient appears to have CKD stage IV and his creatinine is at baseline.  Patient does not have CKD stage IIIb as mentioned in previous notes.  Hyperlipidemia: Continue atorvastatin.  Delirium: Still he is having delirium symptoms intermittently.  We will continue nightly Seroquel. 1. Avoid benzodiazepines, antihistamines, anticholinergics, and minimize opiate use as these may worsen delirium. 2: Assess, prevent and manage pain as lack of treatment can  result in delirium.  3: Provide appropriate lighting and clear signage; a clock and calendar should be easily visible to the patient. 4: Monitor environmental factors. Reduce light and noise at night (close shades, turn off lights, turn off TV, ect). Correct any alterations in sleep cycle. 5: Reorient the patient to person, place, time and situation on each encounter.  6: Correct sensory deficits if possible (replace eye glasses, hearing aids, ect). 7: Avoid restraints if able. Severely delirious patients benefit from constant observation by a sitter.  Chronic hypercalcemia and hyperparathyroidism -Calcium level appears to be stable compared to baseline -Monitor level, if remains stable, likely can follow-up with PCP/endocrinology outpatient.  Placement: PT OT recommends SNF.  Per TOC, insurance has been authorized but facility does not have a bed until tomorrow.  Plan for discharge tomorrow.  DVT prophylaxis: heparin injection 5,000 Units Start: 05/02/21 1515   Code Status: Full Code  Family Communication:  None present at bedside.  Discussed with daughter over the phone.  Status is: Inpatient Remains inpatient appropriate because: Medically stable but no bed available at facility.   Estimated body mass index is 21.96 kg/m (pended) as calculated from the following:   Height as of this encounter: (P) 5' 5.5" (1.664 m).   Weight as of 04/16/21: 60.8 kg.    Nutritional Assessment: Body mass index is 21.96 kg/m (pended).. Seen by dietician.  I agree with the assessment and plan as outlined below: Nutrition Status:   Skin Assessment: I have examined the patient's skin and I agree with the wound assessment as performed by the wound care RN as outlined below:    Consultants:  General surgery  Procedures:  As above  Antimicrobials:  Anti-infectives (From admission, onward)  Start     Dose/Rate Route Frequency Ordered Stop   05/03/21 0600  cefTRIAXone (ROCEPHIN) 2 g in sodium  chloride 0.9 % 100 mL IVPB        2 g 200 mL/hr over 30 Minutes Intravenous On call to O.R. 05/02/21 1642 05/02/21 1755         Subjective:  Seen and examined this morning.  He was fully alert and oriented.  No complaints.  Per nurses, earlier today, he was slightly confused.  Objective: Vitals:   05/08/21 1628 05/08/21 2052 05/09/21 0555 05/09/21 0751  BP: (!) 174/73 (!) 146/68 (!) 164/58 (!) 157/61  Pulse: 75 67 67 74  Resp: 16 17 17 16   Temp: 98.4 F (36.9 C) 97.9 F (36.6 C) 97.7 F (36.5 C) (!) 97.5 F (36.4 C)  TempSrc: Oral Oral Oral Oral  SpO2: 99% 100% 100% 100%  Height:        Intake/Output Summary (Last 24 hours) at 05/09/2021 1300 Last data filed at 05/08/2021 2112 Gross per 24 hour  Intake 240 ml  Output 200 ml  Net 40 ml    There were no vitals filed for this visit.  Examination:  General exam: Appears calm and comfortable  Respiratory system: Clear to auscultation. Respiratory effort normal. Cardiovascular system: S1 & S2 heard, RRR. No JVD, murmurs, rubs, gallops or clicks. No pedal edema. Gastrointestinal system: Abdomen is nondistended, soft and nontender. No organomegaly or masses felt. Normal bowel sounds heard. Central nervous system: Alert and oriented. No focal neurological deficits. Extremities: Symmetric 5 x 5 power. Skin: No rashes, lesions or ulcers.  Psychiatry: Judgement and insight appear normal. Mood & affect appropriate.   Data Reviewed: I have personally reviewed following labs and imaging studies  CBC: Recent Labs  Lab 05/03/21 0227 05/04/21 0957 05/05/21 0124 05/06/21 0910  WBC 13.8* 7.5 7.2 4.8  HGB 12.3* 9.7* 9.9* 9.4*  HCT 38.2* 30.9* 31.3* 28.9*  MCV 88.2 88.8 88.7 86.8  PLT 198 145* 133* 123*    Basic Metabolic Panel: Recent Labs  Lab 05/03/21 0227 05/04/21 0957 05/05/21 0124 05/06/21 0910 05/07/21 0817  NA 137 133* 134* 135 134*  K 5.2* 4.5 4.4 4.4 4.5  CL 108 106 106 105 106  CO2 19* 21* 22 21* 23   GLUCOSE 138* 148* 83 85 90  BUN 37* 41* 38* 32* 36*  CREATININE 2.26* 2.44* 2.23* 2.03* 2.20*  CALCIUM 10.2 9.5 9.6 9.5 9.5  MG  --   --   --   --  1.9    GFR: CrCl cannot be calculated (Unknown ideal weight.). Liver Function Tests: No results for input(s): AST, ALT, ALKPHOS, BILITOT, PROT, ALBUMIN in the last 168 hours.  No results for input(s): LIPASE, AMYLASE in the last 168 hours.  No results for input(s): AMMONIA in the last 168 hours. Coagulation Profile: No results for input(s): INR, PROTIME in the last 168 hours. Cardiac Enzymes: No results for input(s): CKTOTAL, CKMB, CKMBINDEX, TROPONINI in the last 168 hours. BNP (last 3 results) No results for input(s): PROBNP in the last 8760 hours. HbA1C: No results for input(s): HGBA1C in the last 72 hours. CBG: No results for input(s): GLUCAP in the last 168 hours. Lipid Profile: No results for input(s): CHOL, HDL, LDLCALC, TRIG, CHOLHDL, LDLDIRECT in the last 72 hours. Thyroid Function Tests: No results for input(s): TSH, T4TOTAL, FREET4, T3FREE, THYROIDAB in the last 72 hours. Anemia Panel: No results for input(s): VITAMINB12, FOLATE, FERRITIN, TIBC, IRON, RETICCTPCT in the last  72 hours. Sepsis Labs: Recent Labs  Lab 05/02/21 1516 05/02/21 1644  LATICACIDVEN 2.2* 2.7*     Recent Results (from the past 240 hour(s))  Resp Panel by RT-PCR (Flu A&B, Covid) Nasopharyngeal Swab     Status: None   Collection Time: 05/02/21 12:31 PM   Specimen: Nasopharyngeal Swab; Nasopharyngeal(NP) swabs in vial transport medium  Result Value Ref Range Status   SARS Coronavirus 2 by RT PCR NEGATIVE NEGATIVE Final    Comment: (NOTE) SARS-CoV-2 target nucleic acids are NOT DETECTED.  The SARS-CoV-2 RNA is generally detectable in upper respiratory specimens during the acute phase of infection. The lowest concentration of SARS-CoV-2 viral copies this assay can detect is 138 copies/mL. A negative result does not preclude  SARS-Cov-2 infection and should not be used as the sole basis for treatment or other patient management decisions. A negative result may occur with  improper specimen collection/handling, submission of specimen other than nasopharyngeal swab, presence of viral mutation(s) within the areas targeted by this assay, and inadequate number of viral copies(<138 copies/mL). A negative result must be combined with clinical observations, patient history, and epidemiological information. The expected result is Negative.  Fact Sheet for Patients:  EntrepreneurPulse.com.au  Fact Sheet for Healthcare Providers:  IncredibleEmployment.be  This test is no t yet approved or cleared by the [place] FDA and  has been authorized for detection and/or diagnosis of SARS-CoV-2 by FDA under an Emergency Use Authorization (EUA). This EUA will remain  in effect (meaning this test can be used) for the duration of the COVID-19 declaration under Section 564(b)(1) of the Act, 21 U.S.C.section 360bbb-3(b)(1), unless the authorization is terminated  or revoked sooner.       Influenza A by PCR NEGATIVE NEGATIVE Final   Influenza B by PCR NEGATIVE NEGATIVE Final    Comment: (NOTE) The Xpert Xpress SARS-CoV-2/FLU/RSV plus assay is intended as an aid in the diagnosis of influenza from Nasopharyngeal swab specimens and should not be used as a sole basis for treatment. Nasal washings and aspirates are unacceptable for Xpert Xpress SARS-CoV-2/FLU/RSV testing.  Fact Sheet for Patients: EntrepreneurPulse.com.au  Fact Sheet for Healthcare Providers: IncredibleEmployment.be  This test is not yet approved or cleared by the [place] FDA and has been authorized for detection and/or diagnosis of SARS-CoV-2 by FDA under an Emergency Use Authorization (EUA). This EUA will remain in effect (meaning this test can be used) for the duration of  the COVID-19 declaration under Section 564(b)(1) of the Act, 21 U.S.C. section 360bbb-3(b)(1), unless the authorization is terminated or revoked.  Performed at [facility], [place] [address], [place], [place] [address]       Radiology Studies: No results found.  Scheduled Meds:  acetaminophen  650 mg Oral Q6H   amLODipine  10 mg Oral Q supper   aspirin EC  81 mg Oral Q breakfast   atorvastatin  80 mg Oral Q supper   docusate sodium  100 mg Oral BID   feeding supplement  1 Container Oral BID BM   heparin  5,000 Units Subcutaneous Q12H   hydrALAZINE  100 mg Oral BID WC   isosorbide mononitrate  120 mg Oral Q supper   losartan  100 mg Oral Q breakfast   metoprolol tartrate  25 mg Oral BID WC   nicotine  14 mg Transdermal Daily   polyethylene glycol  17 g Oral Daily   QUEtiapine  25 mg Oral Q24H   Continuous Infusions:   LOS: 7 days  Time spent: 24 minutes  Darliss Cheney, MD [facility]  05/09/2021, 1:00 PM  Please page via [place] and do not message via secure chat for urgent patient care matters. Secure chat can be used for non urgent patient care matters.  How to contact the [facility] Attending or Consulting provider [place] or covering provider during after hours [place], for this patient?  Check the care team in [facility] and look for a) attending/consulting TR[facility] provider listed and b) the [facility] team listed. Page or secure chat [phone]. Log into [email or web address] and use [place]'s universal password to access. If you do not have the password, please contact the hospital operator. Locate the [facility] provider you are looking for under [facility] and page to a number that you can be directly reached. If you still have difficulty reaching the provider, please page the [facility] (Director on Call) for the Hospitalists listed on amion for assistance.

## 2021-05-09 NOTE — TOC Progression Note (Addendum)
Transition of Care (TOC) - Progression Note  ? ? ?Patient Details  ?Name: [name] ?MRN: [id number] ?Date of Birth: 1930-05-16 ? ?Transition of Care (TOC) CM/SW Contact  ?Emeterio Reeve, LCSW ?Phone Number: ?05/09/2021, 1:33 PM ? ?Clinical Narrative:    ? ?CSW gave pts daughter [name] bed offers by phone. [name] chose [facility]. CSW spoke to admissions coordinator and stated a bed is not available today but there should be tomorrow.  ? ?Pts insurance Josem Kaufmann has been approved auth ID [id number], Josem Kaufmann is good until 04/15/21.  ? ?Pts Passr # is [id number]  ? ?Expected Discharge Plan: Home/Self Care ?Barriers to Discharge: Continued Medical Work up ? ?Expected Discharge Plan and Services ?Expected Discharge Plan: Home/Self Care ?  ?Discharge Planning Services: CM Consult ?  ?Living arrangements for the past 2 months: [place] ?                ?DME Arranged: N/A ?  ?  ?  ?  ?HH Arranged: NA ?  ?  ?  ?  ? ? ?Social Determinants of Health (SDOH) Interventions ?  ? ?Readmission Risk Interventions ?No flowsheet data found. ? ?Emeterio Reeve, LCSW ?Clinical Social Worker ? ?

## 2021-05-09 NOTE — Care Management Important Message (Signed)
Important Message ? ?Patient Details  ?Name: [name] ?MRN: [id number] ?Date of Birth: 06-30-1930 ? ? ?Medicare Important Message Given:  Yes ? ? ? ? ?Levada Dy  Wylie Russon-Martin ?05/09/2021, 2:36 PM ?

## 2021-05-09 NOTE — Progress Notes (Signed)
Mobility Specialist Progress Note: ? ? 05/09/21 1503  ?Mobility  ?Activity Ambulated with assistance in room  ?Level of Assistance Standby assist, set-up cues, supervision of patient - no hands on  ?Assistive Device Front wheel walker  ?Distance Ambulated (ft) 120 ft  ?Activity Response Tolerated well  ?$Mobility charge 1 Mobility  ? ?Pt received in bed willing to participate in mobility. Complaints of  7/10 incision pain. Left in bed with call bell in reach and all needs met.  ? ?Kimm Sider ?Mobility Specialist ?Primary Phone [phone] ? ?

## 2021-05-10 NOTE — Plan of Care (Signed)
  Problem: Nutrition: Goal: Adequate nutrition will be maintained Outcome: Progressing   Problem: Pain Managment: Goal: General experience of comfort will improve Outcome: Progressing   Problem: Safety: Goal: Ability to remain free from injury will improve Outcome: Progressing   

## 2021-05-10 NOTE — Progress Notes (Signed)
PROGRESS NOTE    [name]  WJX:[id number] DOB: 05-15-1930 DOA: 05/02/2021 PCP: Mack Hook, MD   Brief Narrative:  HPI: [name] is a 88 y.o. male with medical history significant of HTN, CKD stage IIIb, cecal colon CA status post right hemicolectomy, CAD s/p stenting, Stroke, hyperparathyroidism, came with worsening of right groin pain.   Patient has a chronic right inguinal hernia but always reducible.  Patient started to feel right groin pain about 3 days ago gradually getting worse, with cramping-like intermittent but became constant this morning associated with nausea but no vomiting.  Constipated since yesterday.   ED Course: Afebrile, no tachycardia no hypotension.  Physical exam showed nonreducible right inguinal hernia.  CT abdomen pelvis showed incarcerated right inguinal hernia with small bowel trapped inside the hernia sac.  Assessment & Plan:   Principal Problem:   Incarcerated right inguinal hernia Active Problems:   Coronary artery disease   Essential hypertension   Hyperlipidemia   CKD (chronic kidney disease), stage IV ([place])   Incarcerated inguinal hernia  Incarcerated right inguinal hernia -Status post surgical repair 05/02/2021.  Tolerating soft diet.  Cleared by general surgery for discharge.   Essential hypertension: Blood pressure controlled.  Continue amlodipine, Imdur, Lopressor and losartan and resume hydralazine.   CAD: Asymptomatic.  We will continue aspirin and Lopressor.   CKD stage 4: Patient appears to have CKD stage IV and his creatinine is at baseline.  Patient does not have CKD stage IIIb as mentioned in previous notes.  Hyperlipidemia: Continue atorvastatin.  Delirium: Resolved.  Chronic hypercalcemia and hyperparathyroidism -Calcium level appears to be stable compared to baseline -Monitor level, if remains stable, likely can follow-up with PCP/endocrinology outpatient.  Placement: PT OT recommends SNF.  Per TOC, insurance  has been authorized but facility does not have a bed until tomorrow.  Plan for discharge tomorrow.  DVT prophylaxis: heparin injection 5,000 Units Start: 05/02/21 1515   Code Status: Full Code  Family Communication:  None present at bedside.  Discussed with daughter over the phone.  Status is: Inpatient Remains inpatient appropriate because: Patient has been medically stable since last 2 to 3 days.  Per TOC, we received insurance authorization on 05/09/2021 however facility did not have a bed and TOC was informed that the facility will have a bed for him on 05/10/2021 however again on 05/10/2021, facility told TOC that they do not have bed today.  Patient is going to end up staying another night tonight and will be discharged tomorrow.   Estimated body mass index is 21.96 kg/m (pended) as calculated from the following:   Height as of this encounter: (P) 5' 5.5" (1.664 m).   Weight as of 04/16/21: 60.8 kg.    Nutritional Assessment: Body mass index is 21.96 kg/m (pended).. Seen by dietician.  I agree with the assessment and plan as outlined below: Nutrition Status:   Skin Assessment: I have examined the patient's skin and I agree with the wound assessment as performed by the wound care RN as outlined below:    Consultants:  General surgery  Procedures:  As above  Antimicrobials:  Anti-infectives (From admission, onward)    Start     Dose/Rate Route Frequency Ordered Stop   05/03/21 0600  cefTRIAXone (ROCEPHIN) 2 g in sodium chloride 0.9 % 100 mL IVPB        2 g 200 mL/hr over 30 Minutes Intravenous On call to O.R. 05/02/21 1642 05/02/21 1755  Subjective:  Seen and examined.  Daughter at the bedside.  Patient talking to the daughter in good mood and talking to some other family friend over the phone as well.  He is fully alert and oriented.  Objective: Vitals:   05/09/21 1700 05/09/21 2210 05/10/21 0551 05/10/21 0923  BP: (!) 163/66 (!) 125/54 (!) 124/44 140/66   Pulse:  (!) 57 (!) 52 66  Resp:  '16 18 18  '$ Temp:  98.8 F (37.1 C) 98.4 F (36.9 C) 99.2 F (37.3 C)  TempSrc:  Oral Oral Oral  SpO2:  100% 96% 100%  Height:        Intake/Output Summary (Last 24 hours) at 05/10/2021 1343 Last data filed at 05/10/2021 4917 Gross per 24 hour  Intake 240 ml  Output 500 ml  Net -260 ml    There were no vitals filed for this visit.  Examination:  General exam: Appears calm and comfortable  Respiratory system: Clear to auscultation. Respiratory effort normal. Cardiovascular system: S1 & S2 heard, RRR. No JVD, murmurs, rubs, gallops or clicks. No pedal edema. Gastrointestinal system: Abdomen is nondistended, soft and nontender. No organomegaly or masses felt. Normal bowel sounds heard. Central nervous system: Alert and oriented. No focal neurological deficits. Extremities: Symmetric 5 x 5 power. Skin: No rashes, lesions or ulcers.  Psychiatry: Judgement and insight appear normal. Mood & affect appropriate.    Data Reviewed: I have personally reviewed following labs and imaging studies  CBC: Recent Labs  Lab 05/04/21 0957 05/05/21 0124 05/06/21 0910  WBC 7.5 7.2 4.8  HGB 9.7* 9.9* 9.4*  HCT 30.9* 31.3* 28.9*  MCV 88.8 88.7 86.8  PLT 145* 133* 123*    Basic Metabolic Panel: Recent Labs  Lab 05/04/21 0957 05/05/21 0124 05/06/21 0910 05/07/21 0817  NA 133* 134* 135 134*  K 4.5 4.4 4.4 4.5  CL 106 106 105 106  CO2 21* 22 21* 23  GLUCOSE 148* 83 85 90  BUN 41* 38* 32* 36*  CREATININE 2.44* 2.23* 2.03* 2.20*  CALCIUM 9.5 9.6 9.5 9.5  MG  --   --   --  1.9    GFR: CrCl cannot be calculated (Unknown ideal weight.). Liver Function Tests: No results for input(s): AST, ALT, ALKPHOS, BILITOT, PROT, ALBUMIN in the last 168 hours.  No results for input(s): LIPASE, AMYLASE in the last 168 hours.  No results for input(s): AMMONIA in the last 168 hours. Coagulation Profile: No results for input(s): INR, PROTIME in the last 168  hours. Cardiac Enzymes: No results for input(s): CKTOTAL, CKMB, CKMBINDEX, TROPONINI in the last 168 hours. BNP (last 3 results) No results for input(s): PROBNP in the last 8760 hours. HbA1C: No results for input(s): HGBA1C in the last 72 hours. CBG: No results for input(s): GLUCAP in the last 168 hours. Lipid Profile: No results for input(s): CHOL, HDL, LDLCALC, TRIG, CHOLHDL, LDLDIRECT in the last 72 hours. Thyroid Function Tests: No results for input(s): TSH, T4TOTAL, FREET4, T3FREE, THYROIDAB in the last 72 hours. Anemia Panel: No results for input(s): VITAMINB12, FOLATE, FERRITIN, TIBC, IRON, RETICCTPCT in the last 72 hours. Sepsis Labs: No results for input(s): PROCALCITON, LATICACIDVEN in the last 168 hours.   Recent Results (from the past 240 hour(s))  Resp Panel by RT-PCR (Flu A&B, Covid) Nasopharyngeal Swab     Status: None   Collection Time: 05/02/21 12:31 PM   Specimen: Nasopharyngeal Swab; Nasopharyngeal(NP) swabs in vial transport medium  Result Value Ref Range Status   SARS Coronavirus  2 by RT PCR NEGATIVE NEGATIVE Final    Comment: (NOTE) SARS-CoV-2 target nucleic acids are NOT DETECTED.  The SARS-CoV-2 RNA is generally detectable in upper respiratory specimens during the acute phase of infection. The lowest concentration of SARS-CoV-2 viral copies this assay can detect is 138 copies/mL. A negative result does not preclude SARS-Cov-2 infection and should not be used as the sole basis for treatment or other patient management decisions. A negative result may occur with  improper specimen collection/handling, submission of specimen other than nasopharyngeal swab, presence of viral mutation(s) within the areas targeted by this assay, and inadequate number of viral copies(<138 copies/mL). A negative result must be combined with clinical observations, patient history, and epidemiological information. The expected result is Negative.  Fact Sheet for Patients:   EntrepreneurPulse.com.au  Fact Sheet for Healthcare Providers:  IncredibleEmployment.be  This test is no t yet approved or cleared by the [place] FDA and  has been authorized for detection and/or diagnosis of SARS-CoV-2 by FDA under an Emergency Use Authorization (EUA). This EUA will remain  in effect (meaning this test can be used) for the duration of the COVID-19 declaration under Section 564(b)(1) of the Act, 21 U.S.C.section 360bbb-3(b)(1), unless the authorization is terminated  or revoked sooner.       Influenza A by PCR NEGATIVE NEGATIVE Final   Influenza B by PCR NEGATIVE NEGATIVE Final    Comment: (NOTE) The Xpert Xpress SARS-CoV-2/FLU/RSV plus assay is intended as an aid in the diagnosis of influenza from Nasopharyngeal swab specimens and should not be used as a sole basis for treatment. Nasal washings and aspirates are unacceptable for Xpert Xpress SARS-CoV-2/FLU/RSV testing.  Fact Sheet for Patients: EntrepreneurPulse.com.au  Fact Sheet for Healthcare Providers: IncredibleEmployment.be  This test is not yet approved or cleared by the [place] FDA and has been authorized for detection and/or diagnosis of SARS-CoV-2 by FDA under an Emergency Use Authorization (EUA). This EUA will remain in effect (meaning this test can be used) for the duration of the COVID-19 declaration under Section 564(b)(1) of the Act, 21 U.S.C. section 360bbb-3(b)(1), unless the authorization is terminated or revoked.  Performed at [facility], [place] [address] [place], [place] [address]       Radiology Studies: No results found.  Scheduled Meds:  acetaminophen  650 mg Oral Q6H   amLODipine  10 mg Oral Q supper   aspirin EC  81 mg Oral Q breakfast   atorvastatin  80 mg Oral Q supper   docusate sodium  100 mg Oral BID   feeding supplement  1 Container Oral BID BM   heparin  5,000 Units  Subcutaneous Q12H   hydrALAZINE  100 mg Oral BID WC   isosorbide mononitrate  120 mg Oral Q supper   losartan  100 mg Oral Q breakfast   metoprolol tartrate  25 mg Oral BID WC   nicotine  14 mg Transdermal Daily   polyethylene glycol  17 g Oral Daily   QUEtiapine  25 mg Oral Q24H   Continuous Infusions:   LOS: 8 days   Time spent: 21 minutes  Darliss Cheney, MD [facility]  05/10/2021, 1:43 PM  Please page via [facility] and do not message via secure chat for urgent patient care matters. Secure chat can be used for non urgent patient care matters.  How to contact the [facility] Main Attending or Consulting provider [place] or covering provider during after hours [place], for this patient?  Check the  care team in [facility] and look for a) attending/consulting [place] provider listed and b) the [facility] team listed. Page or secure chat [phone]. Log into [email or web address] and use [place]'s universal password to access. If you do not have the password, please contact the hospital operator. Locate the [facility] provider you are looking for under [facility] and page to a number that you can be directly reached. If you still have difficulty reaching the provider, please page the [facility] (Director on Call) for the Hospitalists listed on amion for assistance.

## 2021-05-10 NOTE — Progress Notes (Signed)
Mobility Specialist Progress Note: ? ? 05/10/21 1224  ?Mobility  ?Activity Ambulated with assistance in hallway  ?Level of Assistance Contact guard assist, steadying assist  ?Assistive Device Front wheel walker  ?Distance Ambulated (ft) 200 ft  ?Activity Response Tolerated well  ?$Mobility charge 1 Mobility  ? ?Pt received in bed willing to participate in mobility. No complaints of pain. Pt left in bed with call bell in reach and all needs met.  ? ?[name] ?Mobility Specialist ?Primary Phone [phone] ? ?

## 2021-05-10 NOTE — TOC Progression Note (Signed)
Transition of Care (TOC) - Progression Note  ? ? ?Patient Details  ?Name: [name] ?MRN: [id number] ?Date of Birth: 1930-11-23 ? ?Transition of Care (TOC) CM/SW Contact  ?Emeterio Reeve, LCSW ?Phone Number: ?05/10/2021, 11:31 AM ? ?Clinical Narrative:    ? ?[facility] does not have a bed for pt today. ? ?Expected Discharge Plan: Home/Self Care ?Barriers to Discharge: Continued Medical Work up ? ?Expected Discharge Plan and Services ?Expected Discharge Plan: Home/Self Care ?  ?Discharge Planning Services: CM Consult ?  ?Living arrangements for the past 2 months: [place] ?Expected Discharge Date: 05/10/21               ?DME Arranged: N/A ?  ?  ?  ?  ?HH Arranged: NA ?  ?  ?  ?  ? ? ?Social Determinants of Health (SDOH) Interventions ?  ? ?Readmission Risk Interventions ?No flowsheet data found. ? ?Emeterio Reeve, LCSW ?Clinical Social Worker ? ?

## 2021-05-11 LAB — BASIC METABOLIC PANEL
Anion gap: 6 (ref 5–15)
BUN: 41 mg/dL — ABNORMAL HIGH (ref 8–23)
CO2: 21 mmol/L — ABNORMAL LOW (ref 22–32)
Calcium: 9.7 mg/dL (ref 8.9–10.3)
Chloride: 104 mmol/L (ref 98–111)
Creatinine, Ser: 2.2 mg/dL — ABNORMAL HIGH (ref 0.61–1.24)
GFR, Estimated: 28 mL/min — ABNORMAL LOW (ref >60)
Glucose, Bld: 115 mg/dL — ABNORMAL HIGH (ref 70–99)
Potassium: 5 mmol/L (ref 3.5–5.1)
Sodium: 131 mmol/L — ABNORMAL LOW (ref 135–145)

## 2021-05-11 LAB — MAGNESIUM: Magnesium: 1.9 mg/dL (ref 1.7–2.4)

## 2021-05-11 NOTE — Discharge Summary (Signed)
PatientPhysician Discharge Summary  [name] RSW:[id number] DOB: 12-06-1930 DOA: 05/02/2021  PCP: Mack Hook, MD  Admit date: 05/02/2021 Discharge date: 05/11/2021 30 Day Unplanned Readmission Risk Score    Flowsheet Row ED to Hosp-Admission (Current) from 05/02/2021 in [place]  30 Day Unplanned Readmission Risk Score (%) 15.74 Filed at 05/11/2021 0801       This score is the patient's risk of an unplanned readmission within 30 days of being discharged (0 -100%). The score is based on dignosis, age, lab data, medications, orders, and past utilization.   Low:  0-14.9   Medium: 15-21.9   High: 22-29.9   Extreme: 30 and above          Admitted From: Home Disposition: SNF or home with home health.  Waiting for the decision by the patient.  Recommendations for Outpatient Follow-up:  Follow up with PCP in 1-2 weeks Please obtain BMP/CBC in one week Please follow up with your PCP on the following pending results: Unresulted Labs (From admission, onward)    None         Home Health: None Equipment/Devices: None if he were to go to SNF but if he goes home then he will have walker and 3 in 1 commode.  Discharge Condition: Stable CODE STATUS: Full code Diet recommendation: Cardiac  Subjective: Seen and examined.  He has no complaints.  He has been eager to go home for loss few days.  Brief/Interim Summary: [name] is a 88 y.o. male with medical history significant of HTN, CKD stage IIIb, cecal colon CA status post right hemicolectomy, CAD s/p stenting, Stroke, hyperparathyroidism, came with worsening of right groin pain.  He was diagnosed with incarcerated right inguinal hernia.  S/p surgical repair on 05/02/2021.  Tolerating diet for the last several days.  Cleared by general surgery however he ended up having some delirium during this hospitalization which also resolved with delirium precautions and Seroquel.  He has remained  alert and oriented for last 4 days.  He was assessed by PT OT who recommended SNF.  Insurance has been approved however unfortunately we are getting run around by SNF facility with the hopes that they will have a bed in next 24 hours for last 3 days.  As of the time of dictation of this discharge summary, social worker is still waiting for the facility to respond if there is a bed available or not.  In the interim, [name] of [facility] had discussed with the patient and the daughter.  Patient seems to be very adamant on going home today by himself.  I personally had a lengthy conversation with the patient in the presence of patient's daughter [name] and patient's nurse and updated him about current status.  Patient states that he is tired of staying here and he wants me to discharge him home.  Daughter was not on the same page and did not agree with that however patient seems to be alert and oriented and has the capacity to make decisions for himself.  Patient was clearly informed that he will not be safe if he were to go home because he lives alone and he needs assistance.  Per daughter, she is not able to live with him either.  After all that lengthy discussion, patient was agreeable to wait for some more time until we hear back from the facility about the bed availability.  If no bed available, patient will have the choice to either stay  and go to the facility once bed is available or go home with home health, rolling walker and 3 in 1 commode.   Essential hypertension: Blood pressure controlled.  Resume home medications.   CAD: Asymptomatic.  Resume home medications.   CKD stage 4: Patient appears to have CKD stage IV and his creatinine is at baseline.  Patient does not have CKD stage IIIb as mentioned in previous notes.  Discharge plan was discussed with patient and/or family member and they verbalized understanding and agreed with it.  Discharge Diagnoses:  Principal Problem:   Incarcerated right  inguinal hernia Active Problems:   Coronary artery disease   Essential hypertension   Hyperlipidemia   CKD (chronic kidney disease), stage IV (HCC)   Incarcerated inguinal hernia    Discharge Instructions   Allergies as of 05/11/2021   No Known Allergies      Medication List     TAKE these medications    acetaminophen 325 MG tablet Commonly known as: TYLENOL Take 2 tablets (650 mg total) by mouth every 6 (six) hours as needed for mild pain.   AeroChamber Plus Flo-Vu Medium Misc 1 each by Other route once.   albuterol 108 (90 Base) MCG/ACT inhaler Commonly known as: ProAir HFA Inhale 2 puffs into the lungs every 6 (six) hours as needed for wheezing or shortness of breath.   amLODipine 10 MG tablet Commonly known as: NORVASC TAKE 1 TABLET BY MOUTH EVERY DAY WITH evening meal What changed:  how much to take how to take this when to take this additional instructions   aspirin 81 MG EC tablet Commonly known as: Aspirin Low Dose TAKE 1 TABLET BY MOUTH EVERY MORNING WITH A MEAL What changed:  how much to take when to take this additional instructions   atorvastatin 80 MG tablet Commonly known as: LIPITOR TAKE 1 TABLET BY MOUTH EVERY EVENING WITH A MEAL What changed:  how much to take how to take this when to take this additional instructions   docusate sodium 100 MG capsule Commonly known as: COLACE Take 1 capsule (100 mg total) by mouth daily as needed for mild constipation.   furosemide 40 MG tablet Commonly known as: LASIX TAKE 1 TABLET BY MOUTH EVERY DAY WITH morning meal What changed:  how much to take how to take this when to take this additional instructions   hydrALAZINE 100 MG tablet Commonly known as: APRESOLINE TAKE 1 TABLET BY MOUTH 2 TIMES DAILY WITH morning and evening meals What changed:  how much to take how to take this when to take this additional instructions   isosorbide mononitrate 120 MG 24 hr tablet Commonly known as:  IMDUR TAKE 1 TABLET BY MOUTH EVERY DAY with evening meal What changed:  how much to take when to take this additional instructions   loratadine 10 MG tablet Commonly known as: CLARITIN 1 tab by mouth daily as needed for allergy symptoms What changed:  how much to take how to take this when to take this reasons to take this additional instructions   losartan 100 MG tablet Commonly known as: COZAAR TAKE 1 TABLET BY MOUTH EVERY DAY WITH morning meal What changed:  how much to take how to take this when to take this additional instructions   metoprolol tartrate 25 MG tablet Commonly known as: LOPRESSOR TAKE 1 TABLET BY MOUTH 2 TIMES DAILY WITH morning AND evening meals What changed:  how much to take how to take this when to take this additional  instructions   nitroGLYCERIN 0.4 MG SL tablet Commonly known as: NITROSTAT PLACE 1 TABLET UNDER THE TONGUE EVERY 5 MINUTES AS NEEDED FOR CHEST PAIN, SEEK MEDICAL ATTENTION IF NO RELIEF What changed:  how much to take when to take this reasons to take this additional instructions   polyethylene glycol 17 g packet Commonly known as: MIRALAX / GLYCOLAX Take 17 g by mouth daily as needed for mild constipation.        Follow-up Information     Sur[facility], [place]. Schedule an appointment as soon as possible for a visit on 05/27/2021.   Specialty: General Surgery Why: 1115am. Please arrive 30 min prior to appointment time. Bring photo ID and insurance information with you. Contact information: [address] [place] [address] [phone]         Mack Hook, MD Follow up in 1 week(s).   Specialty: Internal Medicine Contact information: [place] [address] [phone]         Belva Crome, MD .   Specialty: Cardiology Contact information: [phone] [place] [address] [phone]                No Known  Allergies  Consultations: General surgery   Procedures/Studies: CT ABDOMEN PELVIS WO CONTRAST  Result Date: 05/02/2021 CLINICAL DATA:  Abdominal pain.  Scrotal swelling, dialysis patient. EXAM: CT ABDOMEN AND PELVIS WITHOUT CONTRAST TECHNIQUE: Multidetector CT imaging of the abdomen and pelvis was performed following the standard protocol without IV contrast. RADIATION DOSE REDUCTION: This exam was performed according to the departmental dose-optimization program which includes automated exposure control, adjustment of the mA and/or kV according to patient size and/or use of iterative reconstruction technique. COMPARISON:  04/28/2017 FINDINGS: Lower chest: Right basilar atelectasis. No acute abnormality. Coronary artery atherosclerosis. Hepatobiliary: No focal liver abnormality is seen. Prior cholecystectomy. Pancreas: Unremarkable. No pancreatic ductal dilatation or surrounding inflammatory changes. Spleen: Normal in size without focal abnormality. Adrenals/Urinary Tract: Adrenal glands are unremarkable. 18 mm hypodense, fluid attenuating left renal mass consistent with a cyst. No urolithiasis or obstructive uropathy. Normal bladder. Stomach/Bowel: Gastric air-fluid level. Numerous dilated loops of small bowel which are fluid-filled measuring up to 2.9 cm. Right inguinal hernia containing a loop of small bowel with small bowel feces and a small amount of fluid adjacent to the small bowel. Vascular/Lymphatic: Normal caliber abdominal aorta with mild atherosclerosis. No lymphadenopathy. Reproductive: Prostate is unremarkable. Other: No abdominal or pelvic ascites. Small fat containing left inguinal hernia. Musculoskeletal: No acute osseous abnormality. No aggressive osseous lesion. Degenerative disease with disc height loss at L3-4, L4-5 and L5-S1 with reactive endplate changes and Schmorl's nodes. Bilateral facet arthropathy throughout the lumbar spine. Chronic T11 vertebral body compression fracture with a  Schmorl's node along the superior endplate. IMPRESSION: 1. Small bowel obstruction with a transition point resulting from a right inguinal hernia containing a loop of small bowel. Feces within the small bowel loops within the right inguinal hernia and a small amount of perienteric fluid concerning for incarceration. 2. Aortic Atherosclerosis (ICD10-I70.0). Electronically Signed   By: Kathreen Devoid M.D.   On: 05/02/2021 13:31   DG Abd Portable 1V  Result Date: 05/02/2021 CLINICAL DATA:  Nasogastric tube placement EXAM: PORTABLE ABDOMEN - 1 VIEW COMPARISON:  None. FINDINGS: Nasogastric tube tip is seen overlying the gastric fundus. Normal abdominal gas pattern. Cholecystectomy clips in the right upper quadrant. IMPRESSION: Nasogastric tube tip overlying the gastric fundus. Electronically Signed  By: Fidela Salisbury M.D.   On: 05/02/2021 20:09   US SCROTUM W/DOPPLER  Result Date: 05/02/2021 CLINICAL DATA:  Testicular pain EXAM: SCROTAL ULTRASOUND DOPPLER ULTRASOUND OF THE TESTICLES TECHNIQUE: Complete ultrasound examination of the testicles, epididymis, and other scrotal structures was performed. Color and spectral Doppler ultrasound were also utilized to evaluate blood flow to the testicles. COMPARISON:  CT examination dated May 02, 2021 FINDINGS: Right testicle Measurements: 2.8 x 1.5 x 2.0. Heterogeneous parenchyma. No mass or microlithiasis visualized. Left testicle Measurements: 4.1 x 2.9 x 2.6. No mass or microlithiasis visualized. Right epididymis:  Epididymal cyst measuring up to 0.5 cm. Left epididymis:  Normal in size and appearance. Hydrocele:  Bilateral hydroceles, left greater than the right Varicocele:  None visualized. Pulsed Doppler interrogation of both testes demonstrates normal low resistance arterial and venous waveforms bilaterally. Others: Skin thickening with large right inguinal hernia containing echogenic bowel loops concerning for incarcerated hernia. IMPRESSION: 1. Scrotal skin  thickening with edematous bowel loop in the scrotal sac concerning for incarcerated hernia. 2.  Normal blood flow to the bilateral testes. 3. Heterogeneous echotexture of the right testis, which could be reactive changes in the presence of incarcerated right inguinal hernia. 4.  Bilateral hydrocele, left greater than the right. Electronically Signed   By: Keane Police D.O.   On: 05/02/2021 14:52     Discharge Exam: Vitals:   05/11/21 0429 05/11/21 0813  BP: (!) 150/65 (!) 147/64  Pulse: 62 (!) 59  Resp: 18 18  Temp: 98.4 F (36.9 C) 98.3 F (36.8 C)  SpO2: 100% 100%   Vitals:   05/10/21 1709 05/10/21 1952 05/11/21 0429 05/11/21 0813  BP: (!) 148/62 (!) 150/59 (!) 150/65 (!) 147/64  Pulse: (!) 57 (!) 57 62 (!) 59  Resp: '18 18 18 18  '$ Temp: 98 F (36.7 C) 98 F (36.7 C) 98.4 F (36.9 C) 98.3 F (36.8 C)  TempSrc: Axillary Oral Oral Oral  SpO2: 100% 100% 100% 100%  Height:        General: Pt is alert, awake, not in acute distress Cardiovascular: RRR, S1/S2 +, no rubs, no gallops Respiratory: CTA bilaterally, no wheezing, no rhonchi Abdominal: Soft, NT, ND, bowel sounds + Extremities: no edema, no cyanosis    The results of significant diagnostics from this hospitalization (including imaging, microbiology, ancillary and laboratory) are listed below for reference.     Microbiology: Recent Results (from the past 240 hour(s))  Resp Panel by RT-PCR (Flu A&B, Covid) Nasopharyngeal Swab     Status: None   Collection Time: 05/02/21 12:31 PM   Specimen: Nasopharyngeal Swab; Nasopharyngeal(NP) swabs in vial transport medium  Result Value Ref Range Status   SARS Coronavirus 2 by RT PCR NEGATIVE NEGATIVE Final    Comment: (NOTE) SARS-CoV-2 target nucleic acids are NOT DETECTED.  The SARS-CoV-2 RNA is generally detectable in upper respiratory specimens during the acute phase of infection. The lowest concentration of SARS-CoV-2 viral copies this assay can detect is 138 copies/mL. A  negative result does not preclude SARS-Cov-2 infection and should not be used as the sole basis for treatment or other patient management decisions. A negative result may occur with  improper specimen collection/handling, submission of specimen other than nasopharyngeal swab, presence of viral mutation(s) within the areas targeted by this assay, and inadequate number of viral copies(<138 copies/mL). A negative result must be combined with clinical observations, patient history, and epidemiological information. The expected result is Negative.  Fact Sheet for Patients:  EntrepreneurPulse.com.au  Fact Sheet for Healthcare Providers:  IncredibleEmployment.be  This test is no t yet approved or cleared by the [place] FDA and  has been authorized for detection and/or diagnosis of SARS-CoV-2 by FDA under an Emergency Use Authorization (EUA). This EUA will remain  in effect (meaning this test can be used) for the duration of the COVID-19 declaration under Section 564(b)(1) of the Act, 21 U.S.C.section 360bbb-3(b)(1), unless the authorization is terminated  or revoked sooner.       Influenza A by PCR NEGATIVE NEGATIVE Final   Influenza B by PCR NEGATIVE NEGATIVE Final    Comment: (NOTE) The Xpert Xpress SARS-CoV-2/FLU/RSV plus assay is intended as an aid in the diagnosis of influenza from Nasopharyngeal swab specimens and should not be used as a sole basis for treatment. Nasal washings and aspirates are unacceptable for Xpert Xpress SARS-CoV-2/FLU/RSV testing.  Fact Sheet for Patients: EntrepreneurPulse.com.au  Fact Sheet for Healthcare Providers: IncredibleEmployment.be  This test is not yet approved or cleared by the [place] FDA and has been authorized for detection and/or diagnosis of SARS-CoV-2 by FDA under an Emergency Use Authorization (EUA). This EUA will remain in effect (meaning this test can  be used) for the duration of the COVID-19 declaration under Section 564(b)(1) of the Act, 21 U.S.C. section 360bbb-3(b)(1), unless the authorization is terminated or revoked.  Performed at [facility], [place] [address], [place], [place] [address]      Labs: BNP (last 3 results) Recent Labs    05/02/21 1234  BNP 938.1*   Basic Metabolic Panel: Recent Labs  Lab 05/05/21 0124 05/06/21 0910 05/07/21 0817 05/11/21 0936  NA 134* 135 134* 131*  K 4.4 4.4 4.5 5.0  CL 106 105 106 104  CO2 22 21* 23 21*  GLUCOSE 83 85 90 115*  BUN 38* 32* 36* 41*  CREATININE 2.23* 2.03* 2.20* 2.20*  CALCIUM 9.6 9.5 9.5 9.7  MG  --   --  1.9 1.9   Liver Function Tests: No results for input(s): AST, ALT, ALKPHOS, BILITOT, PROT, ALBUMIN in the last 168 hours. No results for input(s): LIPASE, AMYLASE in the last 168 hours. No results for input(s): AMMONIA in the last 168 hours. CBC: Recent Labs  Lab 05/05/21 0124 05/06/21 0910  WBC 7.2 4.8  HGB 9.9* 9.4*  HCT 31.3* 28.9*  MCV 88.7 86.8  PLT 133* 123*   Cardiac Enzymes: No results for input(s): CKTOTAL, CKMB, CKMBINDEX, TROPONINI in the last 168 hours. BNP: Invalid input(s): POCBNP CBG: No results for input(s): GLUCAP in the last 168 hours. D-Dimer No results for input(s): DDIMER in the last 72 hours. Hgb A1c No results for input(s): HGBA1C in the last 72 hours. Lipid Profile No results for input(s): CHOL, HDL, LDLCALC, TRIG, CHOLHDL, LDLDIRECT in the last 72 hours. Thyroid function studies No results for input(s): TSH, T4TOTAL, T3FREE, THYROIDAB in the last 72 hours.  Invalid input(s): FREET3 Anemia work up No results for input(s): VITAMINB12, FOLATE, FERRITIN, TIBC, IRON, RETICCTPCT in the last 72 hours. Urinalysis    Component Value Date/Time   COLORURINE YELLOW 05/07/2021 [place] 05/07/2021 0241   LABSPEC 1.017 05/07/2021 0241   PHURINE 5.0 05/07/2021 0241   GLUCOSEU NEGATIVE 05/07/2021 0241    HGBUR NEGATIVE 05/07/2021 0241   BILIRUBINUR NEGATIVE 05/07/2021 0241   KETONESUR NEGATIVE 05/07/2021 0241   PROTEINUR NEGATIVE 05/07/2021 0241   UROBILINOGEN 1.0 02/11/2014 1106   NITRITE NEGATIVE 05/07/2021 0241   LEUKOCYTESUR NEGATIVE 05/07/2021 0241  Sepsis Labs Invalid input(s): PROCALCITONIN,  WBC,  LACTICIDVEN Microbiology Recent Results (from the past 240 hour(s))  Resp Panel by RT-PCR (Flu A&B, Covid) Nasopharyngeal Swab     Status: None   Collection Time: 05/02/21 12:31 PM   Specimen: Nasopharyngeal Swab; Nasopharyngeal(NP) swabs in vial transport medium  Result Value Ref Range Status   SARS Coronavirus 2 by RT PCR NEGATIVE NEGATIVE Final    Comment: (NOTE) SARS-CoV-2 target nucleic acids are NOT DETECTED.  The SARS-CoV-2 RNA is generally detectable in upper respiratory specimens during the acute phase of infection. The lowest concentration of SARS-CoV-2 viral copies this assay can detect is 138 copies/mL. A negative result does not preclude SARS-Cov-2 infection and should not be used as the sole basis for treatment or other patient management decisions. A negative result may occur with  improper specimen collection/handling, submission of specimen other than nasopharyngeal swab, presence of viral mutation(s) within the areas targeted by this assay, and inadequate number of viral copies(<138 copies/mL). A negative result must be combined with clinical observations, patient history, and epidemiological information. The expected result is Negative.  Fact Sheet for Patients:  EntrepreneurPulse.com.au  Fact Sheet for Healthcare Providers:  IncredibleEmployment.be  This test is no t yet approved or cleared by the [place] FDA and  has been authorized for detection and/or diagnosis of SARS-CoV-2 by FDA under an Emergency Use Authorization (EUA). This EUA will remain  in effect (meaning this test can be used) for the duration of  the COVID-19 declaration under Section 564(b)(1) of the Act, 21 U.S.C.section 360bbb-3(b)(1), unless the authorization is terminated  or revoked sooner.       Influenza A by PCR NEGATIVE NEGATIVE Final   Influenza B by PCR NEGATIVE NEGATIVE Final    Comment: (NOTE) The Xpert Xpress SARS-CoV-2/FLU/RSV plus assay is intended as an aid in the diagnosis of influenza from Nasopharyngeal swab specimens and should not be used as a sole basis for treatment. Nasal washings and aspirates are unacceptable for Xpert Xpress SARS-CoV-2/FLU/RSV testing.  Fact Sheet for Patients: EntrepreneurPulse.com.au  Fact Sheet for Healthcare Providers: IncredibleEmployment.be  This test is not yet approved or cleared by the [place] FDA and has been authorized for detection and/or diagnosis of SARS-CoV-2 by FDA under an Emergency Use Authorization (EUA). This EUA will remain in effect (meaning this test can be used) for the duration of the COVID-19 declaration under Section 564(b)(1) of the Act, 21 U.S.C. section 360bbb-3(b)(1), unless the authorization is terminated or revoked.  Performed at [facility], [place] [address] [place], [place] [address]      Time coordinating discharge: Over 30 minutes  SIGNED:   Darliss Cheney, MD  [facility] 05/11/2021, 11:00 AM  If 7PM-7AM, please contact night-coverage [email or web address]

## 2021-05-11 NOTE — TOC Transition Note (Signed)
Transition of Care (TOC) - CM/SW Discharge Note ? ? ?Patient Details  ?Name: [name] ?MRN: [id number] ?Date of Birth: April 03, 1930 ? ?Transition of Care (TOC) CM/SW Contact:  ?Raina Mina, LCSWA ?Phone Number: ?05/11/2021, 1:14 PM ? ? ?Clinical Narrative:   CSW spoke with Juliann Pulse at [facility] and stated they will not have a bed until tomorrow for patient.  ? ? ? ?Final next level of care: Home/Self Care (Discharge disposition Home with Home Health vs SNF.) ?Barriers to Discharge: No SNF bed ? ? ?Patient Goals and CMS Choice ?Patient states their goals for this hospitalization and ongoing recovery are:: To return home. ?CMS Medicare.gov Compare Post Acute Care list provided to:: Patient ?  ? ?Discharge Placement ?  ?           ?  ?  ?  ?  ? ?Discharge Plan and Services ?  ?Discharge Planning Services: CM Consult ?           ?DME Arranged:  (Daughter [name] voices if patient is to return home he will need a 3 in 1 bed side commode and a rolling walker.) ?  ?  ?  ?  ?HH Arranged:  (If no SNF bed available will arrange home health services.) ?[facility]:  (Discussed home health services and in-network options with patient and daughter [name]. Patient shares no services used in the past.) ?  ?  ?  ? ?Social Determinants of Health (SDOH) Interventions ?Food Insecurity Interventions: Intervention Not Indicated ?Housing Interventions: Intervention Not Indicated ?Transportation Interventions: Intervention Not Indicated ? ? ?Readmission Risk Interventions ?No flowsheet data found. ? ? ? ? ?

## 2021-05-11 NOTE — TOC Transition Note (Signed)
Transition of Care (TOC) - CM/SW Discharge Note ? ? ?Patient Details  ?Name: [name] ?MRN: [id number] ?Date of Birth: 1930-09-02 ? ?Transition of Care (TOC) CM/SW Contact:  ?Raina Mina, LCSWA ?Phone Number: ?05/11/2021, 11:24 AM ? ? ?Clinical Narrative:   CSW contacted Juliann Pulse with [facility] and she stated she would call CSW back and let her know if there will be a bed available today.  ? ? ? ?Final next level of care: Home/Self Care ?Barriers to Discharge: Continued Medical Work up ? ? ?Patient Goals and CMS Choice ?Patient states their goals for this hospitalization and ongoing recovery are:: to return to home ?CMS Medicare.gov Compare Post Acute Care list provided to:: Patient ?  ? ?Discharge Placement ?  ?           ?  ?  ?  ?  ? ?Discharge Plan and Services ?  ?Discharge Planning Services: CM Consult ?           ?DME Arranged: N/A ?  ?  ?  ?  ?HH Arranged: NA ?  ?  ?  ?  ? ?Social Determinants of Health (SDOH) Interventions ?Food Insecurity Interventions: Intervention Not Indicated ?Housing Interventions: Intervention Not Indicated ?Transportation Interventions: Intervention Not Indicated ? ? ?Readmission Risk Interventions ?No flowsheet data found. ? ? ? ? ?

## 2021-05-11 NOTE — TOC Transition Note (Signed)
Transition of Care (TOC) - CM/SW Discharge Note ? ? ?Patient Details  ?Name: [name] ?MRN: [id number] ?Date of Birth: Apr 24, 1930 ? ?Transition of Care [facility]) CM/SW Contact:  ?Kieran Arreguin, Nonda Lou, RN ?Phone Number: ?05/11/2021, 11:37 AM ? ? ?Clinical Narrative:  TOC team for discharge planning. Spoke to patient and daughter. Daughter [name]- [phone] is at the bedside. Patient is alret, verbally responsive and pleasant. He voices the desire to go home. Discussed the option for SNF placement for rehab but no bed is currently available. He shares that he can go home. Discussed the option of home with home health services. Daughter shares patient has personal care services 2 hours a day to assist. She shares patient needs 3 in 1 bedside commode to decrease fall risk when toileting and a walker. Pt currently uses a n old walker he acquired for a relative in the past. Collaborated with nurse Margie Billet and attending- Dr. Doristine Bosworth regarding discharge plan. Will continue with discharge planning. ? ? ? ?Final next level of care: Home/Self Care (Discharge disposition Home with Home Health vs SNF.) ?Barriers to Discharge: No SNF bed available ? ? ?Patient Goals and CMS Choice ?Patient states their goals for this hospitalization and ongoing recovery are:: To return home. ?CMS Medicare.gov Compare Post Acute Care list provided to:: Patient ?  ? ?Discharge Placement ?  ?           ?  ?  ?  ?  ? ?Discharge Plan and Services ?  ?Discharge Planning Services: CM Consult ?           ?DME Arranged:  (Daughter [name] voices if patient is to return home he will need a 3 in 1 bed side commode and a rolling walker.) ?  ?  ?  ?  ?HH Arranged:  (If no SNF bed available will arrange home health services.) ?[place] [facility]:  (Discussed home health services and in-network options with patient and daughter [name]. Patient shares no services used in the past.) ?  ?  ?  ? ?Social Determinants of Health (SDOH) Interventions ?Food Insecurity  Interventions: Intervention Not Indicated ?Housing Interventions: Intervention Not Indicated ?Transportation Interventions: Intervention Not Indicated ? ? ?Readmission Risk Interventions ?No flowsheet data found. ? ? ? ? ?

## 2021-05-12 LAB — RESP PANEL BY RT-PCR (FLU A&B, COVID) ARPGX2
Influenza A by PCR: NEGATIVE
Influenza B by PCR: NEGATIVE
SARS Coronavirus 2 by RT PCR: NEGATIVE

## 2021-05-12 NOTE — Care Management Important Message (Signed)
Important Message ? ?Patient Details  ?Name: [name] ?MRN: [id number] ?Date of Birth: [birth year] ? ? ?Medicare Important Message Given:  Yes ? ? ? ? ?Levada Dy  Errick Salts-Martin ?05/12/2021, 12:56 PM ?

## 2021-05-12 NOTE — Progress Notes (Signed)
Mobility Specialist Progress Note: ? ? 05/12/21 1342  ?Mobility  ?Activity Ambulated with assistance in hallway  ?Level of Assistance Contact guard assist, steadying assist  ?Assistive Device Front wheel walker  ?Distance Ambulated (ft) 180 ft  ?Activity Response Tolerated well  ?$Mobility charge 1 Mobility  ? ?Pt received in bed willing to participate in mobility. No compalints of pain. Left in chair with call bell in reach and all needs met.  ? ?Darinda [place] ?Mobility Specialist ?Primary Phone [phone] ? ?

## 2021-05-12 NOTE — Telephone Encounter (Signed)
Could you please call [name]?  I see her dad was discharged to a SNF nursing facility.  Do they have a length of time for when he will be there?  I reached out to [facility] to let them know he likely will need more care.   ?The other phone number she gave [place] was for [facility].  They do not answer--was on hold for prolonged period of time.   ?I am unable to find the form [id number] on line. ?Has she tried to call his [facility] Medicare or Medicaid?   ?

## 2021-05-12 NOTE — TOC Transition Note (Signed)
Transition of Care (TOC) - CM/SW Discharge Note ? ? ?Patient Details  ?Name: [name] ?MRN: [id number] ?Date of Birth: Oct 03, 1930 ? ?Transition of Care (TOC) CM/SW Contact:  ?Emeterio Reeve, LCSW ?Phone Number: ?05/12/2021, 10:20 AM ? ? ?Clinical Narrative:    ? ?Per MD patient ready for DC to [facility]. RN, patient, patient's family, and facility notified of DC. Discharge Summary and FL2 sent to facility. DC packet on chart. Insurance Josem Kaufmann has been received and pt is covid negative. Ambulance transport requested for patient.  ?  ?RN to call report to [phone]. ? ?CSW will sign off for now as social work intervention is no longer needed. Please consult [place] again if new needs arise. ? ? ?Final next level of care: [place] ?Barriers to Discharge: Barriers Resolved ? ? ?Patient Goals and CMS Choice ?Patient states their goals for this hospitalization and ongoing recovery are:: To return home. ?CMS Medicare.gov Compare Post Acute Care list provided to:: Patient ?  ? ?Discharge Placement ?  ?           ?Patient chooses bed at: [facility] ?Patient to be transferred to facility by: [facility] ?Name of family member notified: daughter, [name] ?Patient and family notified of of transfer: 05/12/21 ? ?Discharge Plan and Services ?  ?Discharge Planning Services: CM Consult ?           ?DME Arranged:  (Daughter [name] voices if patient is to return home he will need a 3 in 1 bed side commode and a rolling walker.) ?  ?  ?  ?  ?HH Arranged:  (If no SNF bed available will arrange home health services.) ?[place] Agency:  (Discussed home health services and in-network options with patient and daughter [name]. Patient shares no services used in the past.) ?  ?  ?  ? ?Social Determinants of Health (SDOH) Interventions ?Food Insecurity Interventions: Intervention Not Indicated ?Housing Interventions: Intervention Not Indicated ?Transportation Interventions: Intervention Not Indicated ? ? ?Readmission Risk  Interventions ?No flowsheet data found. ? ?Emeterio Reeve, LCSW ?Clinical Social Worker ? ? ? ?

## 2021-05-12 NOTE — Discharge Summary (Signed)
PatientPhysician Discharge Summary  [name] JXB:[id number] DOB: 1930/12/06 DOA: 05/02/2021  PCP: Glen Hook, MD  Admit date: 05/02/2021 Discharge date: 05/11/2021 30 Day Unplanned Readmission Risk Score    Flowsheet Row ED to Hosp-Admission (Current) from 05/02/2021 in [facility]  30 Day Unplanned Readmission Risk Score (%) 15.74 Filed at 05/11/2021 0801       This score is the patient's risk of an unplanned readmission within 30 days of being discharged (0 -100%). The score is based on dignosis, age, lab data, medications, orders, and past utilization.   Low:  0-14.9   Medium: 15-21.9   High: 22-29.9   Extreme: 30 and above          Admitted From: Home Disposition: SNF   Recommendations for Outpatient Follow-up:  Follow up with PCP in 1-2 weeks Please obtain BMP/CBC in one week Please follow up with your PCP on the following pending results: Unresulted Labs (From admission, onward)    None         Home Health: None Equipment/Devices: None   Discharge Condition: Stable CODE STATUS: Full code Diet recommendation: Cardiac  Subjective: Seen and examined.  He has no complaints.  Niece at the bedside.  Brief/Interim Summary: [name] is a 88 y.o. male with medical history significant of HTN, CKD stage IIIb, cecal colon CA status post right hemicolectomy, CAD s/p stenting, Stroke, hyperparathyroidism, came with worsening of right groin pain.  He was diagnosed with incarcerated right inguinal hernia.  S/p surgical repair on 05/02/2021.  Tolerating diet for the last several days.  Cleared by general surgery however he ended up having some delirium during this hospitalization which also resolved with delirium precautions and Seroquel.  He has remained alert and oriented for last 4 days.  He was assessed by PT OT who recommended SNF.  Insurance has been approved however unfortunately we are getting run around by SNF facility with the  hopes that they will have a bed in next 24 hours for last 3 days.  As of the time of dictation of this discharge summary, social worker is still waiting for the facility to respond if there is a bed available or not.  In the interim, [name] of [facility] had discussed with the patient and the daughter.  Patient seems to be very adamant on going home today by himself.  I personally had a lengthy conversation with the patient in the presence of patient's daughter [name] and patient's nurse and updated him about current status.  Patient states that he is tired of staying here and he wants me to discharge him home.  Daughter was not on the same page and did not agree with that however patient seems to be alert and oriented and has the capacity to make decisions for himself.  Patient was clearly informed that he will not be safe if he were to go home because he lives alone and he needs assistance.  Per daughter, she is not able to live with him either.  After all that lengthy discussion, patient was agreeable to wait for some more time until we hear back from the facility about the bed availability.  If no bed available, patient will have the choice to either stay and go to the facility once bed is available or go home with home health, rolling walker and 3 in 1 commode.   Essential hypertension: Blood pressure controlled.  Resume home medications.   CAD: Asymptomatic.  Resume home  medications.   CKD stage 4: Patient appears to have CKD stage IV and his creatinine is at baseline.  Patient does not have CKD stage IIIb as mentioned in previous notes.  Please note that about documented details of the discharge summary was completed on 05/11/2021 however after that, the daughter talked into the patient to staying in the hospital and today we have received a bed offer at the facility so patient is going to be discharged today.  He is in a stable condition as he was yesterday.  Discharge plan was discussed with patient  and/or family member and they verbalized understanding and agreed with it.  Discharge Diagnoses:  Principal Problem:   Incarcerated right inguinal hernia Active Problems:   Coronary artery disease   Essential hypertension   Hyperlipidemia   CKD (chronic kidney disease), stage IV (HCC)   Incarcerated inguinal hernia    Discharge Instructions   Allergies as of 05/11/2021   No Known Allergies      Medication List     TAKE these medications    acetaminophen 325 MG tablet Commonly known as: TYLENOL Take 2 tablets (650 mg total) by mouth every 6 (six) hours as needed for mild pain.   AeroChamber Plus Flo-Vu Medium Misc 1 each by Other route once.   albuterol 108 (90 Base) MCG/ACT inhaler Commonly known as: ProAir HFA Inhale 2 puffs into the lungs every 6 (six) hours as needed for wheezing or shortness of breath.   amLODipine 10 MG tablet Commonly known as: NORVASC TAKE 1 TABLET BY MOUTH EVERY DAY WITH evening meal What changed:  how much to take how to take this when to take this additional instructions   aspirin 81 MG EC tablet Commonly known as: Aspirin Low Dose TAKE 1 TABLET BY MOUTH EVERY MORNING WITH A MEAL What changed:  how much to take when to take this additional instructions   atorvastatin 80 MG tablet Commonly known as: LIPITOR TAKE 1 TABLET BY MOUTH EVERY EVENING WITH A MEAL What changed:  how much to take how to take this when to take this additional instructions   docusate sodium 100 MG capsule Commonly known as: COLACE Take 1 capsule (100 mg total) by mouth daily as needed for mild constipation.   furosemide 40 MG tablet Commonly known as: LASIX TAKE 1 TABLET BY MOUTH EVERY DAY WITH morning meal What changed:  how much to take how to take this when to take this additional instructions   hydrALAZINE 100 MG tablet Commonly known as: APRESOLINE TAKE 1 TABLET BY MOUTH 2 TIMES DAILY WITH morning and evening meals What changed:  how  much to take how to take this when to take this additional instructions   isosorbide mononitrate 120 MG 24 hr tablet Commonly known as: IMDUR TAKE 1 TABLET BY MOUTH EVERY DAY with evening meal What changed:  how much to take when to take this additional instructions   loratadine 10 MG tablet Commonly known as: CLARITIN 1 tab by mouth daily as needed for allergy symptoms What changed:  how much to take how to take this when to take this reasons to take this additional instructions   losartan 100 MG tablet Commonly known as: COZAAR TAKE 1 TABLET BY MOUTH EVERY DAY WITH morning meal What changed:  how much to take how to take this when to take this additional instructions   metoprolol tartrate 25 MG tablet Commonly known as: LOPRESSOR TAKE 1 TABLET BY MOUTH 2 TIMES DAILY WITH morning  AND evening meals What changed:  how much to take how to take this when to take this additional instructions   nitroGLYCERIN 0.4 MG SL tablet Commonly known as: NITROSTAT PLACE 1 TABLET UNDER THE TONGUE EVERY 5 MINUTES AS NEEDED FOR CHEST PAIN, SEEK MEDICAL ATTENTION IF NO RELIEF What changed:  how much to take when to take this reasons to take this additional instructions   polyethylene glycol 17 g packet Commonly known as: MIRALAX / GLYCOLAX Take 17 g by mouth daily as needed for mild constipation.        Follow-up Information     [facility]. Schedule an appointment as soon as possible for a visit on 05/27/2021.   Specialty: General Surgery Why: 1115am. Please arrive 30 min prior to appointment time. Bring photo ID and insurance information with you. Contact information: [address]  [place] [address] [phone]         Glen Hook, MD Follow up in 1 week(s).   Specialty: Internal Medicine Contact information: [place] [address] [phone]         Belva Crome, MD .   Specialty:  Cardiology Contact information: [phone] [address] [place] [address] [phone]                No Known Allergies  Consultations: General surgery   Procedures/Studies: CT ABDOMEN PELVIS WO CONTRAST  Result Date: 05/02/2021 CLINICAL DATA:  Abdominal pain.  Scrotal swelling, dialysis patient. EXAM: CT ABDOMEN AND PELVIS WITHOUT CONTRAST TECHNIQUE: Multidetector CT imaging of the abdomen and pelvis was performed following the standard protocol without IV contrast. RADIATION DOSE REDUCTION: This exam was performed according to the departmental dose-optimization program which includes automated exposure control, adjustment of the mA and/or kV according to patient size and/or use of iterative reconstruction technique. COMPARISON:  04/28/2017 FINDINGS: Lower chest: Right basilar atelectasis. No acute abnormality. Coronary artery atherosclerosis. Hepatobiliary: No focal liver abnormality is seen. Prior cholecystectomy. Pancreas: Unremarkable. No pancreatic ductal dilatation or surrounding inflammatory changes. Spleen: Normal in size without focal abnormality. Adrenals/Urinary Tract: Adrenal glands are unremarkable. 18 mm hypodense, fluid attenuating left renal mass consistent with a cyst. No urolithiasis or obstructive uropathy. Normal bladder. Stomach/Bowel: Gastric air-fluid level. Numerous dilated loops of small bowel which are fluid-filled measuring up to 2.9 cm. Right inguinal hernia containing a loop of small bowel with small bowel feces and a small amount of fluid adjacent to the small bowel. Vascular/Lymphatic: Normal caliber abdominal aorta with mild atherosclerosis. No lymphadenopathy. Reproductive: Prostate is unremarkable. Other: No abdominal or pelvic ascites. Small fat containing left inguinal hernia. Musculoskeletal: No acute osseous abnormality. No aggressive osseous lesion. Degenerative disease with disc height loss at L3-4, L4-5 and L5-S1 with reactive endplate  changes and Schmorl's nodes. Bilateral facet arthropathy throughout the lumbar spine. Chronic T11 vertebral body compression fracture with a Schmorl's node along the superior endplate. IMPRESSION: 1. Small bowel obstruction with a transition point resulting from a right inguinal hernia containing a loop of small bowel. Feces within the small bowel loops within the right inguinal hernia and a small amount of perienteric fluid concerning for incarceration. 2. Aortic Atherosclerosis (ICD10-I70.0). Electronically Signed   By: Kathreen Devoid M.D.   On: 05/02/2021 13:31   DG Abd Portable 1V  Result Date: 05/02/2021 CLINICAL DATA:  Nasogastric tube placement EXAM: PORTABLE ABDOMEN - 1 VIEW COMPARISON:  None. FINDINGS: Nasogastric tube tip is seen overlying the gastric fundus. Normal abdominal gas  pattern. Cholecystectomy clips in the right upper quadrant. IMPRESSION: Nasogastric tube tip overlying the gastric fundus. Electronically Signed   By: Fidela Salisbury M.D.   On: 05/02/2021 20:09   US SCROTUM W/DOPPLER  Result Date: 05/02/2021 CLINICAL DATA:  Testicular pain EXAM: SCROTAL ULTRASOUND DOPPLER ULTRASOUND OF THE TESTICLES TECHNIQUE: Complete ultrasound examination of the testicles, epididymis, and other scrotal structures was performed. Color and spectral Doppler ultrasound were also utilized to evaluate blood flow to the testicles. COMPARISON:  CT examination dated May 02, 2021 FINDINGS: Right testicle Measurements: 2.8 x 1.5 x 2.0. Heterogeneous parenchyma. No mass or microlithiasis visualized. Left testicle Measurements: 4.1 x 2.9 x 2.6. No mass or microlithiasis visualized. Right epididymis:  Epididymal cyst measuring up to 0.5 cm. Left epididymis:  Normal in size and appearance. Hydrocele:  Bilateral hydroceles, left greater than the right Varicocele:  None visualized. Pulsed Doppler interrogation of both testes demonstrates normal low resistance arterial and venous waveforms bilaterally. Others: Skin  thickening with large right inguinal hernia containing echogenic bowel loops concerning for incarcerated hernia. IMPRESSION: 1. Scrotal skin thickening with edematous bowel loop in the scrotal sac concerning for incarcerated hernia. 2.  Normal blood flow to the bilateral testes. 3. Heterogeneous echotexture of the right testis, which could be reactive changes in the presence of incarcerated right inguinal hernia. 4.  Bilateral hydrocele, left greater than the right. Electronically Signed   By: Keane Police D.O.   On: 05/02/2021 14:52     Discharge Exam: Vitals:   05/11/21 0429 05/11/21 0813  BP: (!) 150/65 (!) 147/64  Pulse: 62 (!) 59  Resp: 18 18  Temp: 98.4 F (36.9 C) 98.3 F (36.8 C)  SpO2: 100% 100%   Vitals:   05/10/21 1709 05/10/21 1952 05/11/21 0429 05/11/21 0813  BP: (!) 148/62 (!) 150/59 (!) 150/65 (!) 147/64  Pulse: (!) 57 (!) 57 62 (!) 59  Resp: '18 18 18 18  '$ Temp: 98 F (36.7 C) 98 F (36.7 C) 98.4 F (36.9 C) 98.3 F (36.8 C)  TempSrc: Axillary Oral Oral Oral  SpO2: 100% 100% 100% 100%  Height:        General exam: Appears calm and comfortable  Respiratory system: Clear to auscultation. Respiratory effort normal. Cardiovascular system: S1 & S2 heard, RRR. No JVD, murmurs, rubs, gallops or clicks. No pedal edema. Gastrointestinal system: Abdomen is nondistended, soft and nontender. No organomegaly or masses felt. Normal bowel sounds heard. Central nervous system: Alert and oriented. No focal neurological deficits. Extremities: Symmetric 5 x 5 power. Skin: No rashes, lesions or ulcers.  Psychiatry: Judgement and insight appear poor   The results of significant diagnostics from this hospitalization (including imaging, microbiology, ancillary and laboratory) are listed below for reference.     Microbiology: Recent Results (from the past 240 hour(s))  Resp Panel by RT-PCR (Flu A&B, Covid) Nasopharyngeal Swab     Status: None   Collection Time: 05/02/21 12:31 PM    Specimen: Nasopharyngeal Swab; Nasopharyngeal(NP) swabs in vial transport medium  Result Value Ref Range Status   SARS Coronavirus 2 by RT PCR NEGATIVE NEGATIVE Final    Comment: (NOTE) SARS-CoV-2 target nucleic acids are NOT DETECTED.  The SARS-CoV-2 RNA is generally detectable in upper respiratory specimens during the acute phase of infection. The lowest concentration of SARS-CoV-2 viral copies this assay can detect is 138 copies/mL. A negative result does not preclude SARS-Cov-2 infection and should not be used as the sole basis for treatment or other patient management decisions. A  negative result may occur with  improper specimen collection/handling, submission of specimen other than nasopharyngeal swab, presence of viral mutation(s) within the areas targeted by this assay, and inadequate number of viral copies(<138 copies/mL). A negative result must be combined with clinical observations, patient history, and epidemiological information. The expected result is Negative.  Fact Sheet for Patients:  EntrepreneurPulse.com.au  Fact Sheet for Healthcare Providers:  IncredibleEmployment.be  This test is no t yet approved or cleared by the [place] FDA and  has been authorized for detection and/or diagnosis of SARS-CoV-2 by FDA under an Emergency Use Authorization (EUA). This EUA will remain  in effect (meaning this test can be used) for the duration of the COVID-19 declaration under Section 564(b)(1) of the Act, 21 U.S.C.section 360bbb-3(b)(1), unless the authorization is terminated  or revoked sooner.       Influenza A by PCR NEGATIVE NEGATIVE Final   Influenza B by PCR NEGATIVE NEGATIVE Final    Comment: (NOTE) The Xpert Xpress SARS-CoV-2/FLU/RSV plus assay is intended as an aid in the diagnosis of influenza from Nasopharyngeal swab specimens and should not be used as a sole basis for treatment. Nasal washings and aspirates are  unacceptable for Xpert Xpress SARS-CoV-2/FLU/RSV testing.  Fact Sheet for Patients: EntrepreneurPulse.com.au  Fact Sheet for Healthcare Providers: IncredibleEmployment.be  This test is not yet approved or cleared by the [place] FDA and has been authorized for detection and/or diagnosis of SARS-CoV-2 by FDA under an Emergency Use Authorization (EUA). This EUA will remain in effect (meaning this test can be used) for the duration of the COVID-19 declaration under Section 564(b)(1) of the Act, 21 U.S.C. section 360bbb-3(b)(1), unless the authorization is terminated or revoked.  Performed at [facility], [place] [address] [place],  [address]      Labs: BNP (last 3 results) Recent Labs    05/02/21 1234  BNP 193.7*   Basic Metabolic Panel: Recent Labs  Lab 05/05/21 0124 05/06/21 0910 05/07/21 0817 05/11/21 0936  NA 134* 135 134* 131*  K 4.4 4.4 4.5 5.0  CL 106 105 106 104  CO2 22 21* 23 21*  GLUCOSE 83 85 90 115*  BUN 38* 32* 36* 41*  CREATININE 2.23* 2.03* 2.20* 2.20*  CALCIUM 9.6 9.5 9.5 9.7  MG  --   --  1.9 1.9   Liver Function Tests: No results for input(s): AST, ALT, ALKPHOS, BILITOT, PROT, ALBUMIN in the last 168 hours. No results for input(s): LIPASE, AMYLASE in the last 168 hours. No results for input(s): AMMONIA in the last 168 hours. CBC: Recent Labs  Lab 05/05/21 0124 05/06/21 0910  WBC 7.2 4.8  HGB 9.9* 9.4*  HCT 31.3* 28.9*  MCV 88.7 86.8  PLT 133* 123*   Cardiac Enzymes: No results for input(s): CKTOTAL, CKMB, CKMBINDEX, TROPONINI in the last 168 hours. BNP: Invalid input(s): POCBNP CBG: No results for input(s): GLUCAP in the last 168 hours. D-Dimer No results for input(s): DDIMER in the last 72 hours. Hgb A1c No results for input(s): HGBA1C in the last 72 hours. Lipid Profile No results for input(s): CHOL, HDL, LDLCALC, TRIG, CHOLHDL, LDLDIRECT in the last 72 hours. Thyroid function  studies No results for input(s): TSH, T4TOTAL, T3FREE, THYROIDAB in the last 72 hours.  Invalid input(s): FREET3 Anemia work up No results for input(s): VITAMINB12, FOLATE, FERRITIN, TIBC, IRON, RETICCTPCT in the last 72 hours. Urinalysis    Component Value Date/Time   COLORURINE YELLOW 05/07/2021 [place] 05/07/2021 0241  LABSPEC 1.017 05/07/2021 0241   PHURINE 5.0 05/07/2021 0241   GLUCOSEU NEGATIVE 05/07/2021 0241   HGBUR NEGATIVE 05/07/2021 0241   BILIRUBINUR NEGATIVE 05/07/2021 0241   KETONESUR NEGATIVE 05/07/2021 0241   PROTEINUR NEGATIVE 05/07/2021 0241   UROBILINOGEN 1.0 02/11/2014 1106   NITRITE NEGATIVE 05/07/2021 0241   LEUKOCYTESUR NEGATIVE 05/07/2021 0241   Sepsis Labs Invalid input(s): PROCALCITONIN,  WBC,  LACTICIDVEN Microbiology Recent Results (from the past 240 hour(s))  Resp Panel by RT-PCR (Flu A&B, Covid) Nasopharyngeal Swab     Status: None   Collection Time: 05/02/21 12:31 PM   Specimen: Nasopharyngeal Swab; Nasopharyngeal(NP) swabs in vial transport medium  Result Value Ref Range Status   SARS Coronavirus 2 by RT PCR NEGATIVE NEGATIVE Final    Comment: (NOTE) SARS-CoV-2 target nucleic acids are NOT DETECTED.  The SARS-CoV-2 RNA is generally detectable in upper respiratory specimens during the acute phase of infection. The lowest concentration of SARS-CoV-2 viral copies this assay can detect is 138 copies/mL. A negative result does not preclude SARS-Cov-2 infection and should not be used as the sole basis for treatment or other patient management decisions. A negative result may occur with  improper specimen collection/handling, submission of specimen other than nasopharyngeal swab, presence of viral mutation(s) within the areas targeted by this assay, and inadequate number of viral copies(<138 copies/mL). A negative result must be combined with clinical observations, patient history, and epidemiological information. The expected  result is Negative.  Fact Sheet for Patients:  EntrepreneurPulse.com.au  Fact Sheet for Healthcare Providers:  IncredibleEmployment.be  This test is no t yet approved or cleared by the [place] FDA and  has been authorized for detection and/or diagnosis of SARS-CoV-2 by FDA under an Emergency Use Authorization (EUA). This EUA will remain  in effect (meaning this test can be used) for the duration of the COVID-19 declaration under Section 564(b)(1) of the Act, 21 U.S.C.section 360bbb-3(b)(1), unless the authorization is terminated  or revoked sooner.       Influenza A by PCR NEGATIVE NEGATIVE Final   Influenza B by PCR NEGATIVE NEGATIVE Final    Comment: (NOTE) The Xpert Xpress SARS-CoV-2/FLU/RSV plus assay is intended as an aid in the diagnosis of influenza from Nasopharyngeal swab specimens and should not be used as a sole basis for treatment. Nasal washings and aspirates are unacceptable for Xpert Xpress SARS-CoV-2/FLU/RSV testing.  Fact Sheet for Patients: EntrepreneurPulse.com.au  Fact Sheet for Healthcare Providers: IncredibleEmployment.be  This test is not yet approved or cleared by the [place] FDA and has been authorized for detection and/or diagnosis of SARS-CoV-2 by FDA under an Emergency Use Authorization (EUA). This EUA will remain in effect (meaning this test can be used) for the duration of the COVID-19 declaration under Section 564(b)(1) of the Act, 21 U.S.C. section 360bbb-3(b)(1), unless the authorization is terminated or revoked.  Performed at [facility], [place] [address], [place], [place] [address]      Time coordinating discharge: Over 30 minutes  SIGNED:   Darliss Cheney, MD  [facility] 05/11/2021, 11:00 AM  If 7PM-7AM, please contact night-coverage [email or web address]

## 2021-05-13 ENCOUNTER — Encounter (HOSPITAL_COMMUNITY): Payer: Self-pay

## 2021-05-13 ENCOUNTER — Emergency Department (HOSPITAL_COMMUNITY)
Admission: EM | Admit: 2021-05-13 | Discharge: 2021-05-14 | Disposition: A | Discharge status: Home / Self Care | Payer: Medicare Other | Attending: Emergency Medicine | Admitting: Emergency Medicine

## 2021-05-13 ENCOUNTER — Other Ambulatory Visit: Payer: Self-pay

## 2021-05-13 DIAGNOSIS — X58XXXA Exposure to other specified factors, initial encounter: Secondary | ICD-10-CM | POA: Insufficient documentation

## 2021-05-13 DIAGNOSIS — Z79899 Other long term (current) drug therapy: Secondary | ICD-10-CM | POA: Insufficient documentation

## 2021-05-13 DIAGNOSIS — S301XXA Contusion of abdominal wall, initial encounter: Secondary | ICD-10-CM | POA: Insufficient documentation

## 2021-05-13 DIAGNOSIS — R1030 Lower abdominal pain, unspecified: Secondary | ICD-10-CM | POA: Diagnosis present

## 2021-05-13 DIAGNOSIS — Z7982 Long term (current) use of aspirin: Secondary | ICD-10-CM | POA: Diagnosis not present

## 2021-05-13 LAB — CBC WITH DIFFERENTIAL/PLATELET
Abs Immature Granulocytes: 0.01 10*3/uL (ref 0.00–0.07)
Basophils Absolute: 0 10*3/uL (ref 0.0–0.1)
Basophils Relative: 1 %
Eosinophils Absolute: 0.3 10*3/uL (ref 0.0–0.5)
Eosinophils Relative: 4 %
HCT: 29.9 % — ABNORMAL LOW (ref 39.0–52.0)
Hemoglobin: 9.5 g/dL — ABNORMAL LOW (ref 13.0–17.0)
Immature Granulocytes: 0 %
Lymphocytes Relative: 13 %
Lymphs Abs: 0.8 10*3/uL (ref 0.7–4.0)
MCH: 28.4 pg (ref 26.0–34.0)
MCHC: 31.8 g/dL (ref 30.0–36.0)
MCV: 89.3 fL (ref 80.0–100.0)
Monocytes Absolute: 0.9 10*3/uL (ref 0.1–1.0)
Monocytes Relative: 13 %
Neutro Abs: 4.6 10*3/uL (ref 1.7–7.7)
Neutrophils Relative %: 69 %
Platelets: 292 10*3/uL (ref 150–400)
RBC: 3.35 MIL/uL — ABNORMAL LOW (ref 4.22–5.81)
RDW: 13.9 % (ref 11.5–15.5)
WBC: 6.6 10*3/uL (ref 4.0–10.5)
nRBC: 0 % (ref 0.0–0.2)

## 2021-05-13 LAB — BASIC METABOLIC PANEL
Anion gap: 7 (ref 5–15)
BUN: 44 mg/dL — ABNORMAL HIGH (ref 8–23)
CO2: 22 mmol/L (ref 22–32)
Calcium: 9.9 mg/dL (ref 8.9–10.3)
Chloride: 106 mmol/L (ref 98–111)
Creatinine, Ser: 2.07 mg/dL — ABNORMAL HIGH (ref 0.61–1.24)
GFR, Estimated: 30 mL/min — ABNORMAL LOW (ref >60)
Glucose, Bld: 97 mg/dL (ref 70–99)
Potassium: 5.2 mmol/L — ABNORMAL HIGH (ref 3.5–5.1)
Sodium: 135 mmol/L (ref 135–145)

## 2021-05-13 NOTE — ED Notes (Addendum)
RN and NT changed pt linens, pericare, and applied brief. Placed pt belongings in bag with label. Pt received a hot tray from cafeteria. ?

## 2021-05-13 NOTE — Telephone Encounter (Signed)
LVM asking to return phone call ?

## 2021-05-13 NOTE — Discharge Instructions (Signed)
You were seen in the emergency department for evaluation of swelling and drainage of your right groin.  Your blood work was stable and general surgery did not feel that you needed treatment with any antibiotics.  They did recommend ice to the area and scrotal elevation.  They want you to keep your appointment in the clinic as scheduled.  Return if any worsening or concerning symptoms ?

## 2021-05-13 NOTE — Progress Notes (Addendum)
POD#11 S/P open inguinal hernia repair with mesh by Dr. Zenia Resides 05/02/21 for incarceration RIH causing SBO. ?Presents from nursing facility due to increased R groin swelling and new drainage from incision.  ?The patient states he is unsure if the swelling is much worse but says the drainage is new. He is tolerating PO, urinating with no reported sxs, and having bowel function. He denies fever, chills, nausea, or vomiting. States he wants to go home, not back to rehab.  ? ?Vitals:  ? 05/13/21 1217  ?BP: (!) 166/56  ?Pulse: (!) 52  ?Resp: 16  ?Temp: 98.9 ?F (37.2 ?C)  ?SpO2: 100%  ? ?Physical exam:  ?Elderly black male in NAD ?R groin incision c/d/i - deep palpation around his incision causes scant SS drainage from lateral aspect of incision. The right groin and scrotum are firm, and full without cellulitis or warmth.  ? ?A&P:  ?Post-operative seroma vs hematoma.  ?Afebrile. CBC pending. No signs of recurrent hernia or bowel obstruction. Unless he has a significant leukocytosis I do not think any imaging is necessary at this point. I recommend scrotal support/sling and alternating ice/heat. Could also elevate the scrotum with towels or washcloths while sitting. Follow up in our office with Dr. Zenia Resides as scheduled.  ? ? ? ?Obie Dredge, PA-C ?[facility] ?Please see Amion for pager number during day hours 7:00am-4:30pm ? ? ? ? ? ?

## 2021-05-13 NOTE — ED Notes (Signed)
Got patient into a gown got patient some warm blankets patient is resting with call bell in reach ?

## 2021-05-13 NOTE — ED Triage Notes (Signed)
Pt bib ems from [facility]. EMS stated NP pt had hernia surgery last week and today it was noticed redness, swelling, and possible drainage inside wound vs draining out.  ? ?BP 156/84 ?HR 60 ?RR 16 ?RA 98 ?

## 2021-05-13 NOTE — ED Notes (Addendum)
Pt changed with fresh linen and brief. New warm blankets placed onto patient as well. Pt laying comfortably, awaiting [facility] arrival.  ?

## 2021-05-13 NOTE — ED Provider Notes (Signed)
[facility] EMERGENCY DEPARTMENT Provider Note   CSN: [id number] Arrival date & time: 05/13/21  1152     History  No chief complaint on file.   [name] is a 88 y.o. male.  He is presenting with complaining of some drainage from his recent surgery.  Increased swelling of right scrotum and groin. it looks like he was here about 2 weeks ago with a small bowel obstruction and found to have incarcerated right inguinal hernia.  He was operated on by Dr. Zenia Resides.  He is complaining of moderate pain with movement.  No change since the surgery . denies any fevers.  The history is provided by the patient.  Groin Pain This is a new problem. The current episode started 12 to 24 hours ago. The problem occurs constantly. The problem has not changed since onset.Pertinent negatives include no chest pain, no abdominal pain, no headaches and no shortness of breath. The symptoms are aggravated by bending and twisting. Nothing relieves the symptoms. He has tried rest for the symptoms. The treatment provided no relief.      Home Medications Prior to Admission medications   Medication Sig Start Date End Date Taking? Authorizing Provider  acetaminophen (TYLENOL) 325 MG tablet Take 2 tablets (650 mg total) by mouth every 6 (six) hours as needed for mild pain. 05/06/21   Norm Parcel, PA-C  albuterol (PROAIR HFA) 108 (90 Base) MCG/ACT inhaler Inhale 2 puffs into the lungs every 6 (six) hours as needed for wheezing or shortness of breath. 06/20/19   Mack Hook, MD  amLODipine (NORVASC) 10 MG tablet TAKE 1 TABLET BY MOUTH EVERY DAY WITH evening meal Patient taking differently: Take 10 mg by mouth daily with supper. 11/05/20   Mack Hook, MD  aspirin (ASPIRIN LOW DOSE) 81 MG EC tablet TAKE 1 TABLET BY MOUTH EVERY MORNING WITH A MEAL Patient taking differently: 81 mg daily with breakfast. 11/05/20   Mack Hook, MD  atorvastatin (LIPITOR) 80 MG tablet TAKE 1 TABLET BY  MOUTH EVERY EVENING WITH A MEAL Patient taking differently: Take 80 mg by mouth daily with supper. 01/10/21   Mack Hook, MD  docusate sodium (COLACE) 100 MG capsule Take 1 capsule (100 mg total) by mouth daily as needed for mild constipation. 05/06/21   Norm Parcel, PA-C  furosemide (LASIX) 40 MG tablet TAKE 1 TABLET BY MOUTH EVERY DAY WITH morning meal Patient taking differently: Take 40 mg by mouth daily with breakfast. 11/05/20   Mack Hook, MD  hydrALAZINE (APRESOLINE) 100 MG tablet TAKE 1 TABLET BY MOUTH 2 TIMES DAILY WITH morning and evening meals Patient taking differently: Take 100 mg by mouth 2 (two) times daily with a meal. 11/05/20   Mack Hook, MD  isosorbide mononitrate (IMDUR) 120 MG 24 hr tablet TAKE 1 TABLET BY MOUTH EVERY DAY with evening meal Patient taking differently: 120 mg daily with supper. 11/05/20   Mack Hook, MD  loratadine (CLARITIN) 10 MG tablet 1 tab by mouth daily as needed for allergy symptoms Patient taking differently: Take 10 mg by mouth daily as needed for allergies. 07/26/20   Mack Hook, MD  losartan (COZAAR) 100 MG tablet TAKE 1 TABLET BY MOUTH EVERY DAY WITH morning meal Patient taking differently: Take 100 mg by mouth daily with breakfast. 11/05/20   Mack Hook, MD  metoprolol tartrate (LOPRESSOR) 25 MG tablet TAKE 1 TABLET BY MOUTH 2 TIMES DAILY WITH morning AND evening meals Patient taking differently: Take 25 mg by mouth  2 (two) times daily with a meal. 11/05/20   Mack Hook, MD  nitroGLYCERIN (NITROSTAT) 0.4 MG SL tablet PLACE 1 TABLET UNDER THE TONGUE EVERY 5 MINUTES AS NEEDED FOR CHEST PAIN, SEEK MEDICAL ATTENTION IF NO RELIEF Patient taking differently: 0.4 mg every 5 (five) minutes as needed for chest pain (seek medical attention if no relief). 12/04/19   Mack Hook, MD  polyethylene glycol (MIRALAX / GLYCOLAX) 17 g packet Take 17 g by mouth daily as needed for mild constipation.  05/06/21   Norm Parcel, PA-C  Spacer/Aero-Holding Chambers (AEROCHAMBER PLUS FLO-VU MEDIUM) MISC 1 each by Other route once. 02/01/15   Harvel Quale, MD      Allergies    Patient has no known allergies.    Review of Systems   Review of Systems  Constitutional:  Negative for fever.  HENT:  Negative for sore throat.   Eyes:  Negative for visual disturbance.  Respiratory:  Negative for shortness of breath.   Cardiovascular:  Negative for chest pain.  Gastrointestinal:  Negative for abdominal pain.  Genitourinary:  Positive for scrotal swelling. Negative for dysuria.  Musculoskeletal:  Negative for neck pain.  Skin:  Negative for rash.  Neurological:  Negative for headaches.   Physical Exam Updated Vital Signs BP (!) 168/60 (BP Location: Right Arm)    Pulse 99    Temp 98.2 F (36.8 C) (Oral)    Resp 16    Ht '5\' 8"'$  (1.727 m)    Wt 60 kg    SpO2 98%    BMI 20.11 kg/m  Physical Exam Vitals and nursing note reviewed.  Constitutional:      General: He is not in acute distress.    Appearance: Normal appearance. He is well-developed.  HENT:     Head: Normocephalic and atraumatic.  Eyes:     Conjunctiva/sclera: Conjunctivae normal.  Cardiovascular:     Rate and Rhythm: Normal rate and regular rhythm.     Heart sounds: No murmur heard. Pulmonary:     Effort: Pulmonary effort is normal. No respiratory distress.     Breath sounds: Normal breath sounds.  Abdominal:     Palpations: Abdomen is soft.     Tenderness: There is no abdominal tenderness. There is no guarding or rebound.  Genitourinary:    Comments: He has a healing incision in his right groin.  There is a lot of induration and swelling of his right pubic area into his right hemiscrotum.  There is no active drainage from the incision. Musculoskeletal:        General: No swelling.     Cervical back: Neck supple.  Skin:    General: Skin is warm and dry.     Capillary Refill: Capillary refill takes less than 2 seconds.   Neurological:     Mental Status: He is alert.  Psychiatric:        Mood and Affect: Mood normal.    ED Results / Procedures / Treatments   Labs (all labs ordered are listed, but only abnormal results are displayed) Labs Reviewed  BASIC METABOLIC PANEL - Abnormal; Notable for the following components:      Result Value   Potassium 5.2 (*)    BUN 44 (*)    Creatinine, Ser 2.07 (*)    GFR, Estimated 30 (*)    All other components within normal limits  CBC WITH DIFFERENTIAL/PLATELET - Abnormal; Notable for the following components:   RBC 3.35 (*)    Hemoglobin  9.5 (*)    HCT 29.9 (*)    All other components within normal limits    EKG None  Radiology No results found.  Procedures Procedures    Medications Ordered in ED Medications - No data to display  ED Course/ Medical Decision Making/ A&P Clinical Course as of 05/14/21 0954  Tue May 13, 2021  1208 Discussed with PA [name] from general surgery.  She will evaluate the patient and let me know if he needs imaging. [MB]  2505 Surgery evaluated patient and talked to team who had seen patient before he left after the operation.  He had an existing hematoma or seroma.  They do not feel he needs any imaging at this time.  If his lab work is unremarkable he can return to his facility and follow-up as scheduled. [MB]    Clinical Course User Index [MB] Hayden Rasmussen, MD                           Medical Decision Making Amount and/or Complexity of Data Reviewed Labs: ordered.  This patient complains of drainage from groin incision; this involves an extensive number of treatment Options and is a complaint that carries with it a high risk of complications and morbidity. The differential includes seroma, hematoma, wound dehiscence, postoperative wound infection  I ordered, reviewed and interpreted labs, which included CBC with normal white count, hemoglobin low stable from priors, chemistries with mildly elevated potassium  elevated BUN and creatinine Previous records obtained and reviewed in epic including recent operative note I consulted general surgery and discussed lab and imaging findings and discussed disposition.  Social determinants considered, patient still an active smoker Critical Interventions: None  After the interventions stated above, I reevaluated the patient and found patient to be fairly asymptomatic and comfortable appearing Admission and further testing considered, patient does not require further testing or admission at this time.  Reviewed general surgery recommendations of scrotal elevation ice and follow-up outpatient as scheduled.  Return instructions discussed.          Final Clinical Impression(s) / ED Diagnoses Final diagnoses:  Hematoma of groin, initial encounter    Rx / DC Orders ED Discharge Orders     None         Hayden Rasmussen, MD 05/14/21 [id number]

## 2021-05-23 NOTE — Telephone Encounter (Signed)
[name] called and gave another number for his insurance [phone]. The form is [id number]. Her dad will be discharged from nursing facility on 05/24/21. ?

## 2021-05-26 ENCOUNTER — Telehealth: Payer: Self-pay

## 2021-05-26 NOTE — Telephone Encounter (Signed)
Home health needs verbal confirmation from PCP for patient care plan. Verbal confirmation can be done at [phone] ?

## 2021-05-26 NOTE — Telephone Encounter (Signed)
[facility] called asking for approval of care plan. They want patient to be seen 2x per week for 5 weeks. Then1x per week for 2 weeks. Number to call and confirm is [phone]  ?

## 2021-05-27 NOTE — Telephone Encounter (Signed)
Verbal confirmation has been given for patient health care plan ?

## 2021-06-03 NOTE — Telephone Encounter (Signed)
Dionka, from [facility], call asking to get Rollator walker order sent to [facility]. ?

## 2021-06-11 ENCOUNTER — Ambulatory Visit: Payer: Medicare Other | Admitting: Internal Medicine

## 2021-06-16 ENCOUNTER — Other Ambulatory Visit: Payer: Self-pay | Admitting: Internal Medicine

## 2021-06-23 ENCOUNTER — Telehealth: Payer: Self-pay

## 2021-06-23 NOTE — Telephone Encounter (Addendum)
Leanne from [facility] [phone]) called to inform that they are not continuing care with patient. Patient has bedbugs and [facility] requires proof that they have had an exterminator before giving patient care. They informed [name] of this issue, but [name] expressed to them that she does not want to do that and rather not continue care.  ?

## 2021-06-24 NOTE — Telephone Encounter (Signed)
Spoke to [employer]. Patient is currently staying at his own apartment, not a nursing home. She reported that patient does not have bed bugs. She also said that she agreed to end care with [facility] and she does not plan on finding him a different provider.  ?

## 2021-07-17 ENCOUNTER — Encounter: Payer: Self-pay | Admitting: Internal Medicine

## 2021-07-17 ENCOUNTER — Ambulatory Visit (INDEPENDENT_AMBULATORY_CARE_PROVIDER_SITE_OTHER): Payer: Medicare Other | Admitting: Internal Medicine

## 2021-07-17 VITALS — BP 200/70 | HR 72 | Resp 12 | Ht 65.5 in | Wt 135.0 lb

## 2021-07-17 DIAGNOSIS — I10 Essential (primary) hypertension: Secondary | ICD-10-CM

## 2021-07-17 DIAGNOSIS — I5032 Chronic diastolic (congestive) heart failure: Secondary | ICD-10-CM

## 2021-07-17 DIAGNOSIS — G119 Hereditary ataxia, unspecified: Secondary | ICD-10-CM | POA: Diagnosis not present

## 2021-07-17 DIAGNOSIS — I2510 Atherosclerotic heart disease of native coronary artery without angina pectoris: Secondary | ICD-10-CM | POA: Diagnosis not present

## 2021-07-17 NOTE — Progress Notes (Signed)
Subjective:    Patient ID: [name], male   DOB: [birth year], 88 y.o.   MRN: [id number]   [place] nurse stopped coming to his apt as she felt she saw bed bugs on his person.  Asked her to report to lliving establishment, but not clear that happened.  [name], his medical and I believe general power of attorney is aware, but has reportedly decided not to do anything. He is not getting home health care until this situation is clarified and resolved.    He states he is taking all of his meds, but suspect not.  States being delivered by [employer] in weekly dosing packs.   He does state [name], the woman his family has set to cook and clean for him, does not do any medical monitoring for him.    He is continuing to drive short distances--to clinic and store, both of which are just down the street.  [name] gets most of his food.    Current Meds  Medication Sig   acetaminophen (TYLENOL) 325 MG tablet Take 2 tablets (650 mg total) by mouth every 6 (six) hours as needed for mild pain.   albuterol (PROAIR HFA) 108 (90 Base) MCG/ACT inhaler Inhale 2 puffs into the lungs every 6 (six) hours as needed for wheezing or shortness of breath.   No Known Allergies   Review of Systems    Objective:   BP (!) 200/70 (BP Location: Left Arm, Patient Position: Sitting, Cuff Size: Normal)   Pulse 72   Resp 12   Ht 5' 5.5" (1.664 m)   Wt 135 lb (61.2 kg)   BMI 22.12 kg/m   Physical Exam NAD Lungs:  decreased BS, but CTA CV:  RRR without murmur or rub.  Radial pulses normal and equal. Abd:  S, NT, No HSM or mass, + BS LE:  No edema Very ataxic gait with walker  At his home:  apt appears neat and clean--states all was thrown out that he had brought from home once bedbugs noted.     Assessment & Plan   Hypertension/CAD/CHF:   BP not controlled.  Suspect not taking meds.  Confirmed when visited home and his last bubble packs from [facility] were dated March and meds removed from  different areas of different bubble packs.  Had multiple packs with some morning meds missing and some evening meds removed, but not almost an entire week.  Also, some packs from January.   [place] [facility] who shared they were trying to contact him, but had not been able to.  No one called to give them his new address--they did not know where he went.  Contact info and new address given and they will get out in the next week to him.  I put together a week from the bubble packs for his meds to be given and gave him his morning meds for today.  Spoke with home aid and left written instructions with the bubble pack for the week until new delivery arrives.   Will arrange for a future home visit hopefully once this stabilizes  Received call from [name] from [facility], his home aid.  She mistakenly gave him his evening medication after I had him take his morning meds when there around 10 a.m this morning (now 2 p.m.)  Discussed watching him for low blood pressure/dizziness.   Went over how to look at the med packs and she understood how to look at them by  day and morning vs evening doses. She will call if he misses meds regularly in the evening.

## 2021-08-21 ENCOUNTER — Other Ambulatory Visit: Payer: Self-pay | Admitting: Internal Medicine

## 2021-09-10 ENCOUNTER — Ambulatory Visit: Payer: Medicare Other | Admitting: Interventional Cardiology

## 2021-09-10 ENCOUNTER — Other Ambulatory Visit: Payer: Self-pay | Admitting: Internal Medicine

## 2021-09-10 DIAGNOSIS — I5032 Chronic diastolic (congestive) heart failure: Secondary | ICD-10-CM

## 2021-09-10 DIAGNOSIS — N184 Chronic kidney disease, stage 4 (severe): Secondary | ICD-10-CM

## 2021-09-10 DIAGNOSIS — I2510 Atherosclerotic heart disease of native coronary artery without angina pectoris: Secondary | ICD-10-CM

## 2021-09-10 DIAGNOSIS — J449 Chronic obstructive pulmonary disease, unspecified: Secondary | ICD-10-CM

## 2021-09-10 DIAGNOSIS — I10 Essential (primary) hypertension: Secondary | ICD-10-CM

## 2021-09-10 DIAGNOSIS — E785 Hyperlipidemia, unspecified: Secondary | ICD-10-CM

## 2021-10-07 ENCOUNTER — Ambulatory Visit (INDEPENDENT_AMBULATORY_CARE_PROVIDER_SITE_OTHER): Payer: Medicare Other | Admitting: Internal Medicine

## 2021-10-07 VITALS — BP 112/56 | HR 64 | Resp 16 | Ht 65.5 in | Wt 135.0 lb

## 2021-10-07 DIAGNOSIS — J441 Chronic obstructive pulmonary disease with (acute) exacerbation: Secondary | ICD-10-CM | POA: Diagnosis not present

## 2021-10-07 MED ORDER — AMOXICILLIN-POT CLAVULANATE 875-125 MG PO TABS: 1.0000 | ORAL_TABLET | Freq: Two times a day (BID) | ORAL | 0 refills | Status: AC

## 2021-10-07 MED ORDER — PREDNISONE 20 MG PO TABS: 40.0000 mg | ORAL_TABLET | Freq: Every day | ORAL | 0 refills | Status: DC

## 2021-10-07 MED ORDER — ALBUTEROL SULFATE HFA 108 (90 BASE) MCG/ACT IN AERS: 2.0000 | INHALATION_SPRAY | Freq: Four times a day (QID) | RESPIRATORY_TRACT | 1 refills | Status: DC | PRN

## 2021-10-20 ENCOUNTER — Other Ambulatory Visit: Payer: Self-pay

## 2021-10-20 MED ORDER — NITROGLYCERIN 0.4 MG SL SUBL: SUBLINGUAL_TABLET | SUBLINGUAL | 1 refills | Status: DC

## 2021-12-10 ENCOUNTER — Other Ambulatory Visit: Payer: Self-pay

## 2021-12-10 MED ORDER — NITROGLYCERIN 0.4 MG SL SUBL
SUBLINGUAL_TABLET | SUBLINGUAL | 1 refills | Status: DC
Start: 2021-12-10 — End: 2022-06-23

## 2021-12-17 ENCOUNTER — Other Ambulatory Visit: Payer: Medicare Other | Admitting: Internal Medicine

## 2021-12-17 ENCOUNTER — Encounter: Payer: Self-pay | Admitting: Internal Medicine

## 2021-12-17 DIAGNOSIS — E785 Hyperlipidemia, unspecified: Secondary | ICD-10-CM

## 2021-12-17 DIAGNOSIS — I5032 Chronic diastolic (congestive) heart failure: Secondary | ICD-10-CM | POA: Diagnosis not present

## 2021-12-17 DIAGNOSIS — I2510 Atherosclerotic heart disease of native coronary artery without angina pectoris: Secondary | ICD-10-CM

## 2021-12-17 DIAGNOSIS — I10 Essential (primary) hypertension: Secondary | ICD-10-CM

## 2021-12-17 NOTE — Progress Notes (Signed)
    Subjective:    Patient ID: [name], male   DOB: 1931-03-01, 88 y.o.   MRN: [id number]   HPI  Home visit [facility] home health aid, [name] from [facility] is here. Apparently here, Monday through Friday, noon to 2:30 p.m.  Again, he is taking meds from [facility] blister packs inappropriately. He goes to bed at 3 a.m. and is up at some point in morning before noon.  Actually, often just sleeps on sofa, never makes it to bed.   He does not eat breakfast. [name] makes him a meal at noon and takes morning meds. Not clear if he takes any of Glen meds in the evening (has dinner and bedtime dosing) and not during the weekend as well. Current blister packs filled on 10/12/2021 and 11/06/2021.   [name] has been popping out the pills for the evening dosing and placing on a napkin.   When she returns the next day, he often has not taken.   Taking pills from all different areas of pack instead of taking appropriate dosing for each day.   Sat down with [name] and [name]:  reordered current pack so only has pills left for Wednesday at supper and bedtime.   Numbered each pack Quizzed both of them on how to decide which blister to get meds from according to meal/time of day.  Called [facility] pharmacy:  apparently back in August, they were told not to deliver medications for 2 months as had a backlog of meds.     Current Meds  Medication Sig   albuterol (PROAIR HFA) 108 (90 Base) MCG/ACT inhaler Inhale 2 puffs into the lungs every 6 (six) hours as needed for wheezing or shortness of breath.   No Known Allergies   Review of Systems    Objective:   There were no vitals taken for this visit.  Physical Exam NAD Lungs:  CTA CV:  RRR without murmur or rub.  Radial pulses normal and equal   Assessment & Plan   Difficulty with medical compliance:   Blister packs renumbered with dates written daily. [name] to not open blister pack and leave pills on table--to continue to educate on where to  take Glen med from. [name] to have [name], [name], call when last week of blister pack remaining for new month's shipment--will be first week of November.    Depression:  becomes tearful--states isolated and missing Glen wife, [name]:  have asked [name], our SW to make contact and check in weekly.  To check Glen blister packs as well.   I will check back in 2 weeks to see if they are following the blister packs adequately .

## 2022-01-08 ENCOUNTER — Other Ambulatory Visit: Payer: Medicare Other | Admitting: Internal Medicine

## 2022-01-30 ENCOUNTER — Other Ambulatory Visit: Payer: Self-pay | Admitting: Internal Medicine

## 2022-01-30 DIAGNOSIS — E785 Hyperlipidemia, unspecified: Secondary | ICD-10-CM

## 2022-02-28 ENCOUNTER — Emergency Department (HOSPITAL_COMMUNITY): Payer: Medicare Other

## 2022-02-28 ENCOUNTER — Inpatient Hospital Stay (HOSPITAL_COMMUNITY)
Admission: EM | Admit: 2022-02-28 | Discharge: 2022-03-06 | DRG: 682 | Disposition: A | Discharge status: Skilled Nursing Facility | Payer: Medicare Other | Attending: Internal Medicine | Admitting: Internal Medicine

## 2022-02-28 ENCOUNTER — Other Ambulatory Visit: Payer: Self-pay

## 2022-02-28 ENCOUNTER — Encounter (HOSPITAL_COMMUNITY): Payer: Self-pay | Admitting: Emergency Medicine

## 2022-02-28 DIAGNOSIS — E860 Dehydration: Secondary | ICD-10-CM | POA: Diagnosis not present

## 2022-02-28 DIAGNOSIS — I252 Old myocardial infarction: Secondary | ICD-10-CM

## 2022-02-28 DIAGNOSIS — Z955 Presence of coronary angioplasty implant and graft: Secondary | ICD-10-CM

## 2022-02-28 DIAGNOSIS — G9341 Metabolic encephalopathy: Secondary | ICD-10-CM | POA: Diagnosis not present

## 2022-02-28 DIAGNOSIS — Z8042 Family history of malignant neoplasm of prostate: Secondary | ICD-10-CM

## 2022-02-28 DIAGNOSIS — M109 Gout, unspecified: Secondary | ICD-10-CM | POA: Diagnosis present

## 2022-02-28 DIAGNOSIS — I129 Hypertensive chronic kidney disease with stage 1 through stage 4 chronic kidney disease, or unspecified chronic kidney disease: Secondary | ICD-10-CM | POA: Diagnosis present

## 2022-02-28 DIAGNOSIS — F17210 Nicotine dependence, cigarettes, uncomplicated: Secondary | ICD-10-CM | POA: Diagnosis present

## 2022-02-28 DIAGNOSIS — N179 Acute kidney failure, unspecified: Principal | ICD-10-CM | POA: Diagnosis present

## 2022-02-28 DIAGNOSIS — Z823 Family history of stroke: Secondary | ICD-10-CM

## 2022-02-28 DIAGNOSIS — E782 Mixed hyperlipidemia: Secondary | ICD-10-CM | POA: Diagnosis present

## 2022-02-28 DIAGNOSIS — Z9049 Acquired absence of other specified parts of digestive tract: Secondary | ICD-10-CM

## 2022-02-28 DIAGNOSIS — Z7982 Long term (current) use of aspirin: Secondary | ICD-10-CM

## 2022-02-28 DIAGNOSIS — M25561 Pain in right knee: Secondary | ICD-10-CM | POA: Insufficient documentation

## 2022-02-28 DIAGNOSIS — Z8673 Personal history of transient ischemic attack (TIA), and cerebral infarction without residual deficits: Secondary | ICD-10-CM

## 2022-02-28 DIAGNOSIS — Z8249 Family history of ischemic heart disease and other diseases of the circulatory system: Secondary | ICD-10-CM

## 2022-02-28 DIAGNOSIS — D649 Anemia, unspecified: Secondary | ICD-10-CM | POA: Diagnosis present

## 2022-02-28 DIAGNOSIS — R531 Weakness: Principal | ICD-10-CM

## 2022-02-28 DIAGNOSIS — Z79899 Other long term (current) drug therapy: Secondary | ICD-10-CM

## 2022-02-28 DIAGNOSIS — N1832 Chronic kidney disease, stage 3b: Secondary | ICD-10-CM | POA: Diagnosis present

## 2022-02-28 DIAGNOSIS — M5030 Other cervical disc degeneration, unspecified cervical region: Secondary | ICD-10-CM | POA: Diagnosis present

## 2022-02-28 DIAGNOSIS — M1711 Unilateral primary osteoarthritis, right knee: Secondary | ICD-10-CM | POA: Diagnosis present

## 2022-02-28 DIAGNOSIS — Z8546 Personal history of malignant neoplasm of prostate: Secondary | ICD-10-CM

## 2022-02-28 DIAGNOSIS — I2510 Atherosclerotic heart disease of native coronary artery without angina pectoris: Secondary | ICD-10-CM | POA: Diagnosis present

## 2022-02-28 DIAGNOSIS — E875 Hyperkalemia: Secondary | ICD-10-CM | POA: Diagnosis present

## 2022-02-28 DIAGNOSIS — I10 Essential (primary) hypertension: Secondary | ICD-10-CM | POA: Diagnosis present

## 2022-02-28 DIAGNOSIS — N184 Chronic kidney disease, stage 4 (severe): Secondary | ICD-10-CM | POA: Diagnosis present

## 2022-02-28 DIAGNOSIS — Z1152 Encounter for screening for COVID-19: Secondary | ICD-10-CM

## 2022-02-28 DIAGNOSIS — R911 Solitary pulmonary nodule: Secondary | ICD-10-CM | POA: Insufficient documentation

## 2022-02-28 DIAGNOSIS — Z85038 Personal history of other malignant neoplasm of large intestine: Secondary | ICD-10-CM

## 2022-02-28 DIAGNOSIS — Z923 Personal history of irradiation: Secondary | ICD-10-CM

## 2022-02-28 DIAGNOSIS — Z800 Family history of malignant neoplasm of digestive organs: Secondary | ICD-10-CM

## 2022-02-28 DIAGNOSIS — E213 Hyperparathyroidism, unspecified: Secondary | ICD-10-CM | POA: Diagnosis present

## 2022-02-28 DIAGNOSIS — F05 Delirium due to known physiological condition: Secondary | ICD-10-CM | POA: Diagnosis not present

## 2022-02-28 LAB — RESP PANEL BY RT-PCR (RSV, FLU A&B, COVID)  RVPGX2
Influenza A by PCR: NEGATIVE
Influenza B by PCR: NEGATIVE
Resp Syncytial Virus by PCR: NEGATIVE
SARS Coronavirus 2 by RT PCR: NEGATIVE

## 2022-02-28 MED ORDER — LACTATED RINGERS IV BOLUS
1000.0000 mL | Freq: Once | INTRAVENOUS | Status: AC
  Administered 2022-02-28: 1000 mL via INTRAVENOUS

## 2022-02-28 NOTE — ED Provider Triage Note (Signed)
Emergency Medicine Provider Triage Evaluation Note  [name] , a 88 y.o. male  was evaluated in triage.  Pt complains of complains of generalized weakness onset 3 days.  Patient notes that he fell and hit his head 1 week ago.  Notes then he also has right knee pain due to a fall.  Notes that it was a mechanical fall.  Denies chest pain, shortness of breath.  Review of Systems  Positive:  Negative:   Physical Exam  BP 128/60 (BP Location: Left Arm)   Pulse 70   Temp 98.3 F (36.8 C) (Oral)   Resp 19   SpO2 100%  Gen:   Awake, no distress   Resp:  Normal effort  MSK:   Moves extremities without difficulty  Other:  TTP noted to right knee with swelling and decreased ROM of right knee secondary to pain. No increased warmth to the joint. No overlying skin changes.   Medical Decision Making  Medically screening exam initiated at 12:52 PM.  Appropriate orders placed.  [name] was informed that the remainder of the evaluation will be completed by another provider, this initial triage assessment does not replace that evaluation, and the importance of remaining in the ED until their evaluation is complete.   [name] A, PA-C 02/28/22 1256

## 2022-02-28 NOTE — ED Provider Notes (Signed)
[facility] EMERGENCY DEPARTMENT Provider Note   CSN: [id number] Arrival date & time: 02/28/22  1146     History {Add pertinent medical, surgical, social history, OB history to HPI:1} Chief Complaint  Patient presents with   Weakness    [name] is a 88 y.o. male.  Per nursing triage note patient was here secondary to acute myalgias and low-grade fevers at home.  Had a fall and hurt his knee in the fall.  Patient states he is here because he had difficulty walking for the last couple years.  States that he feels like he is drunk he does not drink.  He states he has fallen and seems to be hurt in his left foot as the main area of pain.  He also states he has weakness in his left leg he has difficulty lifting off the bed which also causes him difficulty walking.  Once again the symptoms been going on for long period of time but recently he is fallen more and having more difficulty that is why presents here.  Lives at home by himself.  His wife died a couple months ago.   Weakness      Home Medications Prior to Admission medications   Medication Sig Start Date End Date Taking? Authorizing Provider  acetaminophen (TYLENOL) 325 MG tablet Take 2 tablets (650 mg total) by mouth every 6 (six) hours as needed for mild pain. Patient not taking: Reported on 12/17/2021 05/06/21   Norm Parcel, PA-C  albuterol [facility] HFA) [phone] Base) MCG/ACT inhaler Inhale 2 puffs into the lungs every 6 (six) hours as needed for wheezing or shortness of breath. 10/07/21   Mack Hook, MD  amLODipine (NORVASC) 10 MG tablet TAKE 1 TABLET BY MOUTH EVERY DAY WITH evening meal 08/28/21   Mack Hook, MD  atorvastatin (LIPITOR) 80 MG tablet TAKE 1 TABLET BY MOUTH EVERY EVENING WITH A MEAL 02/04/22   Mack Hook, MD  furosemide (LASIX) 40 MG tablet TAKE 1 TABLET BY MOUTH EVERY DAY WITH morning meal 08/28/21   Mack Hook, MD  [facility] ASPIRIN 81 MG tablet TAKE 1  TABLET BY MOUTH EVERY MORNING WITH A MEAL 09/10/21   Mack Hook, MD  hydrALAZINE (APRESOLINE) 100 MG tablet TAKE 1 TABLET BY MOUTH 2 TIMES DAILY WITH morning and evening meals 08/28/21   Mack Hook, MD  isosorbide mononitrate (IMDUR) 120 MG 24 hr tablet TAKE 1 TABLET BY MOUTH EVERY EVENING WITH food 08/28/21   Mack Hook, MD  loratadine (ALLERGY RELIEF) 10 MG tablet TAKE 1 TABLET BY MOUTH ONCE DAILY as needed for allergy symptoms 06/20/21   Mack Hook, MD  losartan (COZAAR) 100 MG tablet TAKE 1 TABLET BY MOUTH EVERY DAY WITH morning meal 08/28/21   Mack Hook, MD  metoprolol tartrate (LOPRESSOR) 25 MG tablet TAKE 1 TABLET BY MOUTH 2 TIMES DAILY WITH morning and evening meals 08/28/21   Mack Hook, MD  nitroGLYCERIN (NITROSTAT) 0.4 MG SL tablet PLACE 1 TABLET UNDER THE TONGUE EVERY 5 MINUTES UP TO 3x AS NEEDED FOR CHEST PAIN, SEEK MEDICAL ATTENTION IF NO RELIEF 12/10/21   Mack Hook, MD  Spacer/Aero-Holding Chambers (AEROCHAMBER PLUS FLO-VU MEDIUM) MISC 1 each by Other route once. Patient not taking: Reported on 07/17/2021 02/01/15   Harvel Quale, MD      Allergies    Patient has no known allergies.    Review of Systems   Review of Systems  Neurological:  Positive for weakness.    Physical  Exam Updated Vital Signs BP 136/62   Pulse 97   Temp 97.8 F (36.6 C)   Resp 16   SpO2 98%  Physical Exam Vitals and nursing note reviewed.  Constitutional:      Appearance: He is well-developed.  HENT:     Head: Normocephalic and atraumatic.     Mouth/Throat:     Mouth: Mucous membranes are moist.     Pharynx: Oropharynx is clear.  Eyes:     Pupils: Pupils are equal, round, and reactive to light.  Cardiovascular:     Rate and Rhythm: Normal rate.  Pulmonary:     Effort: Pulmonary effort is normal. No respiratory distress.  Abdominal:     General: Abdomen is flat. There is no distension.  Musculoskeletal:        General: Normal  range of motion.     Cervical back: Normal range of motion.  Skin:    General: Skin is warm and dry.  Neurological:     General: No focal deficit present.     Mental Status: He is alert.     ED Results / Procedures / Treatments   Labs (all labs ordered are listed, but only abnormal results are displayed) Labs Reviewed  RESP PANEL BY RT-PCR (RSV, FLU A&B, COVID)  [id number]  BASIC METABOLIC PANEL  CBC  URINALYSIS, ROUTINE W REFLEX MICROSCOPIC  CBG MONITORING, ED    EKG EKG Interpretation  Date/Time:  Saturday February 28 2022 12:53:01 EST Ventricular Rate:  63 PR Interval:  142 QRS Duration: 70 QT Interval:  382 QTC Calculation: 390 R Axis:   36 Text Interpretation: Normal sinus rhythm Normal ECG When compared with ECG of 02-May-2021 12:25, PREVIOUS ECG IS PRESENT Confirmed by Merrily Pew [phone]) on 02/28/2022 11:29:58 PM  Radiology CT Cervical Spine Wo Contrast  Result Date: 02/28/2022 CLINICAL DATA:  Trauma EXAM: CT CERVICAL SPINE WITHOUT CONTRAST TECHNIQUE: Multidetector CT imaging of the cervical spine was performed without intravenous contrast. Multiplanar CT image reconstructions were also generated. RADIATION DOSE REDUCTION: This exam was performed according to the departmental dose-optimization program which includes automated exposure control, adjustment of the mA and/or kV according to patient size and/or use of iterative reconstruction technique. COMPARISON:  04/29/2010 FINDINGS: Alignment: Normal. Skull base and vertebrae: No acute fracture. No primary bone lesion or focal pathologic process. Soft tissues and spinal canal: No prevertebral fluid or swelling. No visible canal hematoma. Disc levels: Extensive degenerative changes identified with loss of disc spaces and marginal osteophytes throughout the C-spine similar to the 2012 study. Upper chest: Negative. Other: None. IMPRESSION: 1. No acute traumatic abnormality. 2. Extensive degenerative disc disease. Electronically  Signed   By: Sammie Bench M.D.   On: 02/28/2022 14:27   CT Head Wo Contrast  Result Date: 02/28/2022 CLINICAL DATA:  Head trauma, minor (Age >= 65y) EXAM: CT HEAD WITHOUT CONTRAST TECHNIQUE: Contiguous axial images were obtained from the base of the skull through the vertex without intravenous contrast. RADIATION DOSE REDUCTION: This exam was performed according to the departmental dose-optimization program which includes automated exposure control, adjustment of the mA and/or kV according to patient size and/or use of iterative reconstruction technique. COMPARISON:  None Available. FINDINGS: Brain: There is periventricular white matter decreased attenuation consistent with small vessel ischemic changes. Ventricles, sulci and cisterns are prominent consistent with age related involutional changes. No acute intracranial hemorrhage, mass effect or shift. No hydrocephalus. Focal encephalomalacia left basal ganglia consistent with old lacunar CVA. Vascular: No hyperdense vessel or unexpected  calcification. Calvarium: No acute osseous abnormalities. There is a right frontal craniotomy. Sinuses/Orbits: No acute finding. IMPRESSION: Atrophy and chronic small vessel ischemic changes. 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 No acute intracranial process identified. Electronically Signed   By: Sammie Bench M.D.   On: 02/28/2022 14:24   DG Knee Complete 4 Views Right  Result Date: 02/28/2022 CLINICAL DATA:  Right knee pain and swelling EXAM: RIGHT KNEE - COMPLETE 4+ VIEW COMPARISON:  None Available. FINDINGS: No fracture or dislocation of the right knee. 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 There is fragmentation of the superior aspect of the patella with loose bodies in the superior joint space. Large knee joint effusion. Vascular calcinosis. IMPRESSION: 1. No fracture or dislocation of the right knee. 2. 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 3. 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 4. Large knee joint effusion. Electronically Signed   By: Delanna Ahmadi M.D.   On: 02/28/2022 13:44   DG Chest 2 View  Result Date: 02/28/2022 CLINICAL DATA:  Flu-like symptoms EXAM: CHEST - 2 VIEW COMPARISON:  11/30/2019 FINDINGS: The heart size and mediastinal contours are within normal limits. Both lungs are clear. The visualized skeletal structures are unremarkable. IMPRESSION: No active cardiopulmonary disease. Electronically Signed   By: Elmer Picker M.D.   On: 02/28/2022 13:41    Procedures Procedures  {Document cardiac monitor, telemetry assessment procedure when appropriate:1}  Medications Ordered in ED Medications - No data to display  ED Course/ Medical Decision Making/ A&P                           Medical Decision Making  Patient with multiple years of balance problems and generalized weakness that seems to be worse in the last few days.  Will check labs.  CT scan and x-rays look okay except for the effusion in the right knee which is likely acute from his recent fall however his symptoms are more concentrated on the left.   {Document critical care time when appropriate:1} {Document review of labs and clinical decision tools ie heart score, Chads2Vasc2 etc:1}  {Document your independent review of radiology images, and any outside records:1} {Document your discussion with family members, caretakers, and with consultants:1} {Document social determinants of health affecting pt's care:1} {Document your decision making why or why not admission, treatments were needed:1} Final Clinical Impression(s) / ED Diagnoses Final diagnoses:  None    Rx / DC Orders ED Discharge Orders     None

## 2022-02-28 NOTE — ED Triage Notes (Signed)
Patient arrived with home with flu like symptoms and weakness x 3 days. Had slip and fall yesterday with right knee pain, no loc. Alert and oriented with low grade fever

## 2022-03-01 ENCOUNTER — Emergency Department (HOSPITAL_COMMUNITY): Payer: Medicare Other

## 2022-03-01 DIAGNOSIS — F17210 Nicotine dependence, cigarettes, uncomplicated: Secondary | ICD-10-CM | POA: Diagnosis present

## 2022-03-01 DIAGNOSIS — M109 Gout, unspecified: Secondary | ICD-10-CM | POA: Diagnosis present

## 2022-03-01 DIAGNOSIS — I2510 Atherosclerotic heart disease of native coronary artery without angina pectoris: Secondary | ICD-10-CM

## 2022-03-01 DIAGNOSIS — R911 Solitary pulmonary nodule: Secondary | ICD-10-CM | POA: Insufficient documentation

## 2022-03-01 DIAGNOSIS — M25561 Pain in right knee: Secondary | ICD-10-CM | POA: Insufficient documentation

## 2022-03-01 DIAGNOSIS — I10 Essential (primary) hypertension: Secondary | ICD-10-CM

## 2022-03-01 DIAGNOSIS — I252 Old myocardial infarction: Secondary | ICD-10-CM | POA: Diagnosis not present

## 2022-03-01 DIAGNOSIS — E782 Mixed hyperlipidemia: Secondary | ICD-10-CM | POA: Diagnosis present

## 2022-03-01 DIAGNOSIS — F05 Delirium due to known physiological condition: Secondary | ICD-10-CM | POA: Diagnosis not present

## 2022-03-01 DIAGNOSIS — Z955 Presence of coronary angioplasty implant and graft: Secondary | ICD-10-CM | POA: Diagnosis not present

## 2022-03-01 DIAGNOSIS — Z823 Family history of stroke: Secondary | ICD-10-CM | POA: Diagnosis not present

## 2022-03-01 DIAGNOSIS — Z8673 Personal history of transient ischemic attack (TIA), and cerebral infarction without residual deficits: Secondary | ICD-10-CM

## 2022-03-01 DIAGNOSIS — N184 Chronic kidney disease, stage 4 (severe): Secondary | ICD-10-CM | POA: Diagnosis present

## 2022-03-01 DIAGNOSIS — Z800 Family history of malignant neoplasm of digestive organs: Secondary | ICD-10-CM | POA: Diagnosis not present

## 2022-03-01 DIAGNOSIS — R531 Weakness: Secondary | ICD-10-CM | POA: Diagnosis not present

## 2022-03-01 DIAGNOSIS — Z1152 Encounter for screening for COVID-19: Secondary | ICD-10-CM | POA: Diagnosis not present

## 2022-03-01 DIAGNOSIS — E875 Hyperkalemia: Secondary | ICD-10-CM | POA: Diagnosis present

## 2022-03-01 DIAGNOSIS — Z8042 Family history of malignant neoplasm of prostate: Secondary | ICD-10-CM | POA: Diagnosis not present

## 2022-03-01 DIAGNOSIS — Z8249 Family history of ischemic heart disease and other diseases of the circulatory system: Secondary | ICD-10-CM | POA: Diagnosis not present

## 2022-03-01 DIAGNOSIS — N179 Acute kidney failure, unspecified: Secondary | ICD-10-CM | POA: Diagnosis present

## 2022-03-01 DIAGNOSIS — G9341 Metabolic encephalopathy: Secondary | ICD-10-CM | POA: Diagnosis not present

## 2022-03-01 DIAGNOSIS — Z9049 Acquired absence of other specified parts of digestive tract: Secondary | ICD-10-CM | POA: Diagnosis not present

## 2022-03-01 DIAGNOSIS — E860 Dehydration: Secondary | ICD-10-CM | POA: Diagnosis present

## 2022-03-01 DIAGNOSIS — D649 Anemia, unspecified: Secondary | ICD-10-CM | POA: Diagnosis present

## 2022-03-01 DIAGNOSIS — Z8546 Personal history of malignant neoplasm of prostate: Secondary | ICD-10-CM | POA: Diagnosis not present

## 2022-03-01 DIAGNOSIS — I129 Hypertensive chronic kidney disease with stage 1 through stage 4 chronic kidney disease, or unspecified chronic kidney disease: Secondary | ICD-10-CM | POA: Diagnosis present

## 2022-03-01 DIAGNOSIS — Z85038 Personal history of other malignant neoplasm of large intestine: Secondary | ICD-10-CM | POA: Diagnosis not present

## 2022-03-01 LAB — BASIC METABOLIC PANEL
Anion gap: 13 (ref 5–15)
BUN: 87 mg/dL — ABNORMAL HIGH (ref 8–23)
CO2: 19 mmol/L — ABNORMAL LOW (ref 22–32)
Calcium: 11.6 mg/dL — ABNORMAL HIGH (ref 8.9–10.3)
Chloride: 104 mmol/L (ref 98–111)
Creatinine, Ser: 4.09 mg/dL — ABNORMAL HIGH (ref 0.61–1.24)
GFR, Estimated: 13 mL/min — ABNORMAL LOW (ref >60)
Glucose, Bld: 134 mg/dL — ABNORMAL HIGH (ref 70–99)
Potassium: 5.1 mmol/L (ref 3.5–5.1)
Sodium: 136 mmol/L (ref 135–145)

## 2022-03-01 LAB — URINALYSIS, ROUTINE W REFLEX MICROSCOPIC
Bilirubin Urine: NEGATIVE
Glucose, UA: NEGATIVE mg/dL
Hgb urine dipstick: NEGATIVE
Ketones, ur: NEGATIVE mg/dL
Leukocytes,Ua: NEGATIVE
Nitrite: NEGATIVE
Protein, ur: NEGATIVE mg/dL
Specific Gravity, Urine: 1.011 (ref 1.005–1.030)
pH: 5 (ref 5.0–8.0)

## 2022-03-01 LAB — CBC
HCT: 36.4 % — ABNORMAL LOW (ref 39.0–52.0)
HCT: 34.2 % — ABNORMAL LOW (ref 39.0–52.0)
Hemoglobin: 11.7 g/dL — ABNORMAL LOW (ref 13.0–17.0)
Hemoglobin: 10.8 g/dL — ABNORMAL LOW (ref 13.0–17.0)
MCH: 28.2 pg (ref 26.0–34.0)
MCH: 27.8 pg (ref 26.0–34.0)
MCHC: 32.1 g/dL (ref 30.0–36.0)
MCHC: 31.6 g/dL (ref 30.0–36.0)
MCV: 87.7 fL (ref 80.0–100.0)
MCV: 87.9 fL (ref 80.0–100.0)
Platelets: 186 10*3/uL (ref 150–400)
Platelets: 138 10*3/uL — ABNORMAL LOW (ref 150–400)
RBC: 4.15 MIL/uL — ABNORMAL LOW (ref 4.22–5.81)
RBC: 3.89 MIL/uL — ABNORMAL LOW (ref 4.22–5.81)
RDW: 13.9 % (ref 11.5–15.5)
RDW: 13.8 % (ref 11.5–15.5)
WBC: 6.9 10*3/uL (ref 4.0–10.5)
WBC: 6.4 10*3/uL (ref 4.0–10.5)
nRBC: 0 % (ref 0.0–0.2)
nRBC: 0 % (ref 0.0–0.2)

## 2022-03-01 LAB — PHOSPHORUS: Phosphorus: 4.9 mg/dL — ABNORMAL HIGH (ref 2.5–4.6)

## 2022-03-01 LAB — CREATININE, SERUM
Creatinine, Ser: 3.52 mg/dL — ABNORMAL HIGH (ref 0.61–1.24)
GFR, Estimated: 16 mL/min — ABNORMAL LOW (ref >60)

## 2022-03-01 LAB — MAGNESIUM: Magnesium: 1.9 mg/dL (ref 1.7–2.4)

## 2022-03-01 MED ORDER — ONDANSETRON HCL 4 MG/2ML IJ SOLN: 4.0000 mg | Freq: Four times a day (QID) | INTRAMUSCULAR | Status: DC | PRN

## 2022-03-01 MED ORDER — LACTATED RINGERS IV BOLUS
1000.0000 mL | Freq: Once | INTRAVENOUS | Status: AC
  Administered 2022-03-01: 1000 mL via INTRAVENOUS

## 2022-03-01 MED ORDER — METOPROLOL TARTRATE 25 MG PO TABS
25.0000 mg | ORAL_TABLET | Freq: Two times a day (BID) | ORAL | Status: DC
  Administered 2022-03-01 – 2022-03-05 (×3): 25 mg via ORAL
  Filled 2022-03-01 (×10): qty 1

## 2022-03-01 MED ORDER — LOSARTAN POTASSIUM 50 MG PO TABS
100.0000 mg | ORAL_TABLET | Freq: Every day | ORAL | Status: DC
  Administered 2022-03-01: 100 mg via ORAL
  Filled 2022-03-01: qty 2

## 2022-03-01 MED ORDER — ONDANSETRON HCL 4 MG PO TABS: 4.0000 mg | ORAL_TABLET | Freq: Four times a day (QID) | ORAL | Status: DC | PRN

## 2022-03-01 MED ORDER — HEPARIN SODIUM (PORCINE) 5000 UNIT/ML IJ SOLN
5000.0000 [IU] | Freq: Three times a day (TID) | INTRAMUSCULAR | Status: DC
  Administered 2022-03-01 – 2022-03-06 (×13): 5000 [IU] via SUBCUTANEOUS
  Filled 2022-03-01 (×16): qty 1

## 2022-03-01 MED ORDER — ALBUTEROL SULFATE HFA 108 (90 BASE) MCG/ACT IN AERS: 2.0000 | INHALATION_SPRAY | Freq: Four times a day (QID) | RESPIRATORY_TRACT | Status: DC | PRN

## 2022-03-01 MED ORDER — ASPIRIN 81 MG PO TBEC
81.0000 mg | DELAYED_RELEASE_TABLET | Freq: Every day | ORAL | Status: DC
  Administered 2022-03-01 – 2022-03-06 (×5): 81 mg via ORAL
  Filled 2022-03-01 (×6): qty 1

## 2022-03-01 MED ORDER — ISOSORBIDE MONONITRATE ER 60 MG PO TB24
120.0000 mg | ORAL_TABLET | Freq: Every day | ORAL | Status: DC
  Administered 2022-03-01 – 2022-03-06 (×4): 120 mg via ORAL
  Filled 2022-03-01 (×6): qty 2

## 2022-03-01 MED ORDER — ACETAMINOPHEN 650 MG RE SUPP: 650.0000 mg | Freq: Four times a day (QID) | RECTAL | Status: DC | PRN

## 2022-03-01 MED ORDER — ALBUTEROL SULFATE (2.5 MG/3ML) 0.083% IN NEBU: 2.5000 mg | INHALATION_SOLUTION | Freq: Four times a day (QID) | RESPIRATORY_TRACT | Status: DC | PRN

## 2022-03-01 MED ORDER — ATORVASTATIN CALCIUM 80 MG PO TABS
80.0000 mg | ORAL_TABLET | Freq: Every day | ORAL | Status: DC
  Administered 2022-03-01 – 2022-03-06 (×5): 80 mg via ORAL
  Filled 2022-03-01 (×6): qty 1

## 2022-03-01 MED ORDER — SODIUM CHLORIDE 0.9 % IV SOLN: INTRAVENOUS | Status: AC

## 2022-03-01 MED ORDER — ACETAMINOPHEN 325 MG PO TABS
650.0000 mg | ORAL_TABLET | Freq: Four times a day (QID) | ORAL | Status: DC | PRN
  Administered 2022-03-01: 650 mg via ORAL
  Filled 2022-03-01: qty 2

## 2022-03-01 NOTE — Progress Notes (Signed)
PROGRESS NOTE    [name]  GHW:[id number] DOB: [birth year] DOA: 02/28/2022 PCP: Mack Hook, MD   Brief Narrative:  [name] is a 88 y.o. male with medical history significant of hypertension, CKD stage 4, CAD s/p stenting, stroke, hyperparathyroidism who presents to the emergency department due to 3-day onset of flulike symptoms and generalized weakness.    ED Course:  In the emergency department, patient was hemodynamically stable with vital signs being within normal range, BP was 128/56.  Workup in the ED showed normocytic anemia, BMP showed sodium 136, potassium 5.1, chloride 104, bicarb 19, blood glucose 134, BUN 87, creatinine 4.09 (baseline creatinine at 2.0-2.4), calcium 11.6.  Urinalysis was normal.  Influenza A, B, SARS coronavirus 2, RSV was negative.CT abdomen and pelvis without contrast showed no acute intra-abdominal finding.CT cervical spine without contrast showed no acute traumatic abnormality, but showed extensive degenerative disc disease.CT head without contrast showed no acute intracranial process identified Right knee x-ray showed no fracture or dislocation of the right knee. 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 Large knee joint effusion.Chest x-ray showed no active cardiopulmonary disease. IV hydration was provided and hospitalist was asked to admit patient for further evaluation and management.  Assessment & Plan:   Acute kidney injury superimposed on CKD stage IV -BUN 87, creatinine 4.09 (baseline creatinine at 2.0-2.4) -Continue IV hydration -CT renal stone: Negative for any acute findings. -Renally adjust medications, avoid nephrotoxic -agents/dehydration/hypotension   Generalized weakness - possibly due to above -Continue fall precaution  Right knee  pain -Reviewed right knee x-ray.  Continue Tylenol as needed -Continue PT/OT eval and treat   Hypercalcemia: Calcium 11.6, continue IV hydration -PTH level will be checked   Pulmonary nodule -CT abdomen pelvis without contrast showed groundglass  nodules measuring up to 17 mm in the left lower lobe, persistent based on prior imaging. Adenocarcinoma cannot be excluded. Follow up by CT is recommended in 12 months   CAD s/p stenting -Denies ACS symptoms.  Continue aspirin, Lipitor, Lopressor, Imdur   History of CVA -Continue aspirin, Lipitor   Essential hypertension -Continue Lopressor and Imdur.  Hold losartan in the setting of AKI   Mixed hyperlipidemia -Continue Lipitor  DVT prophylaxis: Heparin Code Status: Full code Family Communication:  None present at bedside.  Plan of care discussed with patient in length and he verbalized understanding and agreed with it. Disposition Plan: To be determined  Consultants:  None  Procedures:  None   Antimicrobials:  None  Status is: Inpatient   Subjective: Patient seen and examined.  Reports he presents to the hospital with generalized weakness and lower back pain.  He lives with his kids.  Uses walker for ambulation due to balance issues.  No fever, chills, UTI symptoms, nausea, vomiting.  No recent viral infection.  Objective: Vitals:   02/28/22 2030 03/01/22 0020 03/01/22 0735 03/01/22 0848  BP: 136/62 (!) 128/56 (!) 125/53   Pulse: 97 79 (!) 59 68  Resp: '16 18 18   '$ Temp: 97.8 F (36.6 C) 97.7 F (36.5 C) 98 F (36.7 C)   TempSrc:  Oral Oral   SpO2: 98% 100% 100%     Intake/Output Summary (Last 24 hours) at 03/01/2022 1001 Last data filed at 03/01/2022 0352 Gross per 24 hour  Intake 1000 ml  Output 300 ml  Net 700 ml  There were no vitals filed for this visit.  Examination:  General exam: Appears calm and comfortable, on room air, thin and lean, appears dehydrated Respiratory system: Clear to auscultation.  Respiratory effort normal. Cardiovascular system: S1 & S2 heard, RRR. No JVD, murmurs, rubs, gallops or clicks. No pedal edema. Gastrointestinal system: Abdomen is nondistended, soft and nontender. No organomegaly or masses felt. Normal bowel sounds heard. Central nervous system: Alert and oriented. No focal neurological deficits. Extremities: Symmetric 5 x 5 power. Skin: No rashes, lesions or ulcers Psychiatry: Judgement and insight appear normal. Mood & affect appropriate.    Data Reviewed: I have personally reviewed following labs and imaging studies  CBC: Recent Labs  Lab 02/28/22 2350 03/01/22 0810  WBC 6.9 6.4  HGB 11.7* 10.8*  HCT 36.4* 34.2*  MCV 87.7 87.9  PLT 186 254*   Basic Metabolic Panel: Recent Labs  Lab 02/28/22 2350  NA 136  K 5.1  CL 104  CO2 19*  GLUCOSE 134*  BUN 87*  CREATININE 4.09*  CALCIUM 11.6*   GFR: CrCl cannot be calculated (Unknown ideal weight.). Liver Function Tests: No results for input(s): "AST", "ALT", "ALKPHOS", "BILITOT", "PROT", "ALBUMIN" in the last 168 hours. No results for input(s): "LIPASE", "AMYLASE" in the last 168 hours. No results for input(s): "AMMONIA" in the last 168 hours. Coagulation Profile: No results for input(s): "INR", "PROTIME" in the last 168 hours. Cardiac Enzymes: No results for input(s): "CKTOTAL", "CKMB", "CKMBINDEX", "TROPONINI" in the last 168 hours. BNP (last 3 results) No results for input(s): "PROBNP" in the last 8760 hours. HbA1C: No results for input(s): "HGBA1C" in the last 72 hours. CBG: No results for input(s): "GLUCAP" in the last 168 hours. Lipid Profile: No results for input(s): "CHOL", "HDL", "LDLCALC", "TRIG", "CHOLHDL", "LDLDIRECT" in the last 72 hours. Thyroid Function Tests: No results for input(s): "TSH", "T4TOTAL", "FREET4", "T3FREE", "THYROIDAB" in the last 72 hours. Anemia Panel: No results for input(s): "VITAMINB12", "FOLATE", "FERRITIN", "TIBC", "IRON", "RETICCTPCT" in the last  72 hours. Sepsis Labs: No results for input(s): "PROCALCITON", "LATICACIDVEN" in the last 168 hours.  Recent Results (from the past 240 hour(s))  Resp panel by RT-PCR (RSV, Flu A&B, Covid) Anterior Nasal Swab     Status: None   Collection Time: 02/28/22 12:43 PM   Specimen: Anterior Nasal Swab  Result Value Ref Range Status   SARS Coronavirus 2 by RT PCR NEGATIVE NEGATIVE Final    Comment: (NOTE) SARS-CoV-2 target nucleic acids are NOT DETECTED.  The SARS-CoV-2 RNA is generally detectable in upper respiratory specimens during the acute phase of infection. The lowest concentration of SARS-CoV-2 viral copies this assay can detect is 138 copies/mL. A negative result does not preclude SARS-Cov-2 infection and should not be used as the sole basis for treatment or other patient management decisions. A negative result may occur with  improper specimen collection/handling, submission of specimen other than nasopharyngeal swab, presence of viral mutation(s) within the areas targeted by this assay, and inadequate number of viral copies(<138 copies/mL). A negative result must be combined with clinical observations, patient history, and epidemiological information. The expected result is Negative.  Fact Sheet for Patients:  EntrepreneurPulse.com.au  Fact Sheet for Healthcare Providers:  IncredibleEmployment.be  This test is no t yet approved or cleared by the [place] FDA and  has been authorized for detection and/or diagnosis of SARS-CoV-2 by FDA under an Emergency Use Authorization (EUA). This EUA will remain  in effect (meaning this test can be used) for the duration of  the COVID-19 declaration under Section 564(b)(1) of the Act, 21 U.S.C.section 360bbb-3(b)(1), unless the authorization is terminated  or revoked sooner.       Influenza A by PCR NEGATIVE NEGATIVE Final   Influenza B by PCR NEGATIVE NEGATIVE Final    Comment: (NOTE) The Xpert  Xpress SARS-CoV-2/FLU/RSV plus assay is intended as an aid in the diagnosis of influenza from Nasopharyngeal swab specimens and should not be used as a sole basis for treatment. Nasal washings and aspirates are unacceptable for Xpert Xpress SARS-CoV-2/FLU/RSV testing.  Fact Sheet for Patients: EntrepreneurPulse.com.au  Fact Sheet for Healthcare Providers: IncredibleEmployment.be  This test is not yet approved or cleared by the [place] FDA and has been authorized for detection and/or diagnosis of SARS-CoV-2 by FDA under an Emergency Use Authorization (EUA). This EUA will remain in effect (meaning this test can be used) for the duration of the COVID-19 declaration under Section 564(b)(1) of the Act, 21 U.S.C. section 360bbb-3(b)(1), unless the authorization is terminated or revoked.     Resp Syncytial Virus by PCR NEGATIVE NEGATIVE Final    Comment: (NOTE) Fact Sheet for Patients: EntrepreneurPulse.com.au  Fact Sheet for Healthcare Providers: IncredibleEmployment.be  This test is not yet approved or cleared by the [place] FDA and has been authorized for detection and/or diagnosis of SARS-CoV-2 by FDA under an Emergency Use Authorization (EUA). This EUA will remain in effect (meaning this test can be used) for the duration of the COVID-19 declaration under Section 564(b)(1) of the Act, 21 U.S.C. section 360bbb-3(b)(1), unless the authorization is terminated or revoked.  Performed at [facility], [place] [address], [place], [place] [address]       Radiology Studies: CT Renal Stone Study  Result Date: 03/01/2022 CLINICAL DATA:  Flank pain, question kidney stone EXAM: CT ABDOMEN AND PELVIS WITHOUT CONTRAST TECHNIQUE: Multidetector CT imaging of the abdomen and pelvis was performed following the standard protocol without IV contrast. RADIATION DOSE REDUCTION: This exam was performed according  to the departmental dose-optimization program which includes automated exposure control, adjustment of the mA and/or kV according to patient size and/or use of iterative reconstruction technique. COMPARISON:  05/02/2021 FINDINGS: Lower chest: Aortic and coronary atherosclerosis. Ground-glass nodules in the lower lungs measuring up to 17 mm in the left lower lobe on 4:24, persistent based on prior. Hepatobiliary: No focal liver abnormality.Chest ectomy. Pancreas: Unremarkable. Spleen: Unremarkable. Adrenals/Urinary Tract: Negative adrenals. No hydronephrosis or stone. Unremarkable bladder. Stomach/Bowel: Enterocolonic anastomosis in the right lower quadrant. Diffuse colonic stool. No obstructive pattern of small bowel filling. Vascular/Lymphatic: No acute vascular abnormality. Extensive atheromatous plaque affecting the aorta and branch vessels. No mass or adenopathy. Reproductive:No acute finding Other: No ascites or pneumoperitoneum. Musculoskeletal: No acute abnormalities. Advanced lumbar spine degeneration with extensive endplate irregularity and disc collapse. Chronic T11 compression fracture. IMPRESSION: 1. No acute intra-abdominal finding. 2. Generalized stool retention, question constipation. 3. Ground-glass nodules measuring up to 17 mm in the left lower lobe, persistent based on prior imaging. Adenocarcinoma cannot be excluded. Follow up by CT is recommended in 12 months, with continued annual surveillance for a minimum of 3 years. These recommendations are taken from:Recommendations for the Management of Subsolid Pulmonary Nodules Detected at CT: A Statement from the [facility] 2013; 266:1, 304-317. 4. Extensive atherosclerosis. Electronically Signed   By: Jorje Guild M.D.   On: 03/01/2022 04:23   CT Cervical Spine Wo Contrast  Result Date: 02/28/2022 CLINICAL DATA:  Trauma EXAM: CT CERVICAL SPINE WITHOUT CONTRAST TECHNIQUE: Multidetector  CT imaging of the cervical spine was  performed without intravenous contrast. Multiplanar CT image reconstructions were also generated. RADIATION DOSE REDUCTION: This exam was performed according to the departmental dose-optimization program which includes automated exposure control, adjustment of the mA and/or kV according to patient size and/or use of iterative reconstruction technique. COMPARISON:  04/29/2010 FINDINGS: Alignment: Normal. Skull base and vertebrae: No acute fracture. No primary bone lesion or focal pathologic process. Soft tissues and spinal canal: No prevertebral fluid or swelling. No visible canal hematoma. Disc levels: Extensive degenerative changes identified with loss of disc spaces and marginal osteophytes throughout the C-spine similar to the 2012 study. Upper chest: Negative. Other: None. IMPRESSION: 1. No acute traumatic abnormality. 2. Extensive degenerative disc disease. Electronically Signed   By: Sammie Bench M.D.   On: 02/28/2022 14:27   CT Head Wo Contrast  Result Date: 02/28/2022 CLINICAL DATA:  Head trauma, minor (Age >= 65y) EXAM: CT HEAD WITHOUT CONTRAST TECHNIQUE: Contiguous axial images were obtained from the base of the skull through the vertex without intravenous contrast. RADIATION DOSE REDUCTION: This exam was performed according to the departmental dose-optimization program which includes automated exposure control, adjustment of the mA and/or kV according to patient size and/or use of iterative reconstruction technique. COMPARISON:  None Available. FINDINGS: Brain: There is periventricular white matter decreased attenuation consistent with small vessel ischemic changes. Ventricles, sulci and cisterns are prominent consistent with age related involutional changes. No acute intracranial hemorrhage, mass effect or shift. No hydrocephalus. Focal encephalomalacia left basal ganglia consistent with old lacunar CVA. Vascular: No hyperdense vessel or unexpected calcification. Calvarium: No acute osseous  abnormalities. There is a right frontal craniotomy. Sinuses/Orbits: No acute finding. IMPRESSION: Atrophy and chronic small vessel ischemic changes. 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 No acute intracranial process identified. Electronically Signed   By: Sammie Bench M.D.   On: 02/28/2022 14:24   DG Knee Complete 4 Views Right  Result Date: 02/28/2022 CLINICAL DATA:  Right knee pain and swelling EXAM: RIGHT KNEE - COMPLETE 4+ VIEW COMPARISON:  None Available. FINDINGS: No fracture or dislocation of the right knee. 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 There is fragmentation of the superior aspect of the patella with loose bodies in the superior joint space. Large knee joint effusion. Vascular calcinosis. IMPRESSION: 1. No fracture or dislocation of the right knee. 2. 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 3. 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 4. Large knee joint effusion. Electronically Signed   By: Delanna Ahmadi M.D.   On: 02/28/2022 13:44   DG Chest 2 View  Result Date: 02/28/2022 CLINICAL DATA:  Flu-like symptoms EXAM: CHEST - 2 VIEW COMPARISON:  11/30/2019 FINDINGS: The heart size and mediastinal contours are within normal limits. Both lungs are clear. The visualized skeletal structures are unremarkable. IMPRESSION: No active cardiopulmonary disease. Electronically Signed   By: Elmer Picker M.D.   On: 02/28/2022 13:41    Scheduled Meds:  aspirin EC  81 mg Oral Daily   atorvastatin  80 mg Oral Daily   heparin  5,000 Units Subcutaneous Q8H   isosorbide mononitrate  120 mg Oral Daily   losartan  100 mg Oral Daily   metoprolol tartrate  25 mg Oral BID   Continuous Infusions:  sodium chloride 75 mL/hr at 03/01/22 [phone]  LOS: 0 days   Time  spent: 30 minutes   Telissa Palmisano Loann Quill, MD [facility]  If 7PM-7AM, please contact night-coverage [email or web address] 03/01/2022, 10:01 AM

## 2022-03-01 NOTE — ED Notes (Signed)
Attempted to stand patient and ambulate with 2 person assist.  Patient extremely unsteady on feet, relying on staff heavily.  Patient states "it feels like my legs are going to give out."

## 2022-03-01 NOTE — Progress Notes (Signed)
Pt was recieved from ed, a/o x4, settled in bed. Vitals done. On L/R bolus .safety measures in place. Handed over to dayshift RN for continuity of care.

## 2022-03-01 NOTE — Progress Notes (Signed)
Physical Therapy Treatment Patient Details Name: [name] MRN: [id number] DOB: 1930-07-23 Today's Date: 03/01/2022   History of Present Illness [name] is a 88 y.o. male with medical history significant of hypertension, CKD stage 4, CAD s/p stenting, stroke, hyperparathyroidism who presents to the emergency department due to 3-day onset of flulike symptoms and generalized weakness and fall at home with R knee pain.    PT Comments    Patient presents with decreased mobility due to weakness, decreased balance, decreased safety with pain in knee and recent fall at home.  Patient is poor historian, but hopeful he has 24/7 assist at home.  If so he can return home with HHPT, but if not may need STSNF.  PT will continue to follow during acute stay.  Recommendations for follow up therapy are one component of a multi-disciplinary discharge planning process, led by the attending physician.  Recommendations may be updated based on patient status, additional functional criteria and insurance authorization.  Follow Up Recommendations  Home health PT     Assistance Recommended at Discharge Frequent or constant Supervision/Assistance  Patient can return home with the following A little help with walking and/or transfers;A lot of help with bathing/dressing/bathroom;Assist for transportation;Assistance with cooking/housework;Help with stairs or ramp for entrance;Assistance with feeding   Equipment Recommendations  None recommended by PT    Recommendations for Other Services       Precautions / Restrictions Precautions Precautions: Fall Precaution Comments: ataxic     Mobility  Bed Mobility Overal bed mobility: Needs Assistance Bed Mobility: Supine to Sit     Supine to sit: Min guard, HOB elevated     General bed mobility comments: assist for trunk    Transfers Overall transfer level: Needs assistance Equipment used: Rolling walker (2 wheels) Transfers: Sit to/from Stand Sit  to Stand: Min assist           General transfer comment: up to stand to RW; assist for ataxia    Ambulation/Gait Ambulation/Gait assistance: Min assist Gait Distance (Feet): 30 Feet Assistive device: Rolling walker (2 wheels) Gait Pattern/deviations: Step-through pattern, Ataxic, Decreased stride length, Wide base of support, Trunk flexed       General Gait Details: limb ataxia with mobility and needing A for balance; to door then back around to recliner   Stairs             Wheelchair Mobility    Modified Rankin (Stroke Patients Only)       Balance Overall balance assessment: Needs assistance   Sitting balance-Leahy Scale: Good     Standing balance support: Reliant on assistive device for balance Standing balance-Leahy Scale: Poor                              Cognition Arousal/Alertness: Awake/alert Behavior During Therapy: WFL for tasks assessed/performed Overall Cognitive Status: No family/caregiver present to determine baseline cognitive functioning                                 General Comments: disoriented to place, situation and time        Exercises      General Comments General comments (skin integrity, edema, etc.): mild edema R knee and tender      Pertinent Vitals/Pain Pain Assessment Pain Assessment: Faces Faces Pain Scale: Hurts little more Pain Location: R knee with mobility Pain Descriptors / Indicators: Discomfort,  Aching Pain Intervention(s): Monitored during session, Repositioned, Limited activity within patient's tolerance    Home Living Family/patient expects to be discharged to:: Private residence Living Arrangements: Children Available Help at Discharge: Family;Available PRN/intermittently Type of Home: House Home Access: Level entry       Home Layout: One level Home Equipment: Rollator (4 wheels);Wheelchair - power;Shower seat      Prior Function            PT Goals (current  goals can now be found in the care plan section) Acute Rehab PT Goals Patient Stated Goal: to return home PT Goal Formulation: With patient Time For Goal Achievement: 03/15/22 Potential to Achieve Goals: Fair    Frequency    Min 3X/week      PT Plan      Co-evaluation              AM-PAC PT "6 Clicks" Mobility   Outcome Measure  Help needed turning from your back to your side while in a flat bed without using bedrails?: A Little Help needed moving from lying on your back to sitting on the side of a flat bed without using bedrails?: A Little Help needed moving to and from a bed to a chair (including a wheelchair)?: A Little Help needed standing up from a chair using your arms (e.g., wheelchair or bedside chair)?: A Little Help needed to walk in hospital room?: A Little Help needed climbing 3-5 steps with a railing? : Total 6 Click Score: 16    End of Session Equipment Utilized During Treatment: Gait belt Activity Tolerance: Patient tolerated treatment well Patient left: in chair;with chair alarm set Nurse Communication: Mobility status PT Visit Diagnosis: Muscle weakness (generalized) (M62.81);History of falling (Z91.81);Other symptoms and signs involving the nervous system (R29.898)     Time: 2992-4268 PT Time Calculation (min) (ACUTE ONLY): 29 min  Charges:  $Gait Training: 8-22 mins                     Magda Kiel, PT Acute Rehabilitation Services Office:[phone] 03/01/2022    Reginia Naas 03/01/2022, 3:49 PM

## 2022-03-01 NOTE — Plan of Care (Signed)
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
## 2022-03-02 DIAGNOSIS — N179 Acute kidney failure, unspecified: Secondary | ICD-10-CM | POA: Diagnosis not present

## 2022-03-02 LAB — COMPREHENSIVE METABOLIC PANEL
ALT: 26 U/L (ref 0–44)
AST: 32 U/L (ref 15–41)
Albumin: 2.2 g/dL — ABNORMAL LOW (ref 3.5–5.0)
Alkaline Phosphatase: 56 U/L (ref 38–126)
Anion gap: 6 (ref 5–15)
BUN: 87 mg/dL — ABNORMAL HIGH (ref 8–23)
CO2: 20 mmol/L — ABNORMAL LOW (ref 22–32)
Calcium: 9.7 mg/dL (ref 8.9–10.3)
Chloride: 108 mmol/L (ref 98–111)
Creatinine, Ser: 3.25 mg/dL — ABNORMAL HIGH (ref 0.61–1.24)
GFR, Estimated: 17 mL/min — ABNORMAL LOW (ref >60)
Glucose, Bld: 96 mg/dL (ref 70–99)
Potassium: 5.1 mmol/L (ref 3.5–5.1)
Sodium: 134 mmol/L — ABNORMAL LOW (ref 135–145)
Total Bilirubin: 0.4 mg/dL (ref 0.3–1.2)
Total Protein: 6.3 g/dL — ABNORMAL LOW (ref 6.5–8.1)

## 2022-03-02 LAB — CBC
HCT: 25.8 % — ABNORMAL LOW (ref 39.0–52.0)
Hemoglobin: 8.4 g/dL — ABNORMAL LOW (ref 13.0–17.0)
MCH: 28.1 pg (ref 26.0–34.0)
MCHC: 32.6 g/dL (ref 30.0–36.0)
MCV: 86.3 fL (ref 80.0–100.0)
Platelets: 157 10*3/uL (ref 150–400)
RBC: 2.99 MIL/uL — ABNORMAL LOW (ref 4.22–5.81)
RDW: 13.7 % (ref 11.5–15.5)
WBC: 4.7 10*3/uL (ref 4.0–10.5)
nRBC: 0.4 % — ABNORMAL HIGH (ref 0.0–0.2)

## 2022-03-02 MED ORDER — SODIUM CHLORIDE 0.9 % IV SOLN: INTRAVENOUS | Status: DC

## 2022-03-02 NOTE — Plan of Care (Signed)

## 2022-03-02 NOTE — Progress Notes (Signed)
PROGRESS NOTE    [name]  EPP:[id number] DOB: [birth year] DOA: 02/28/2022 PCP: Mack Hook, MD   Brief Narrative:  [name] is a 88 y.o. male with medical history significant of hypertension, CKD stage 4, CAD s/p stenting, stroke, hyperparathyroidism who presents to the emergency department due to 3-day onset of flulike symptoms and generalized weakness.    ED Course:  In the emergency department, patient was hemodynamically stable with vital signs being within normal range, BP was 128/56.  Workup in the ED showed normocytic anemia, BMP showed sodium 136, potassium 5.1, chloride 104, bicarb 19, blood glucose 134, BUN 87, creatinine 4.09 (baseline creatinine at 2.0-2.4), calcium 11.6.  Urinalysis was normal.  Influenza A, B, SARS coronavirus 2, RSV was negative.CT abdomen and pelvis without contrast showed no acute intra-abdominal finding.CT cervical spine without contrast showed no acute traumatic abnormality, but showed extensive degenerative disc disease.CT head without contrast showed no acute intracranial process identified Right knee x-ray showed no fracture or dislocation of the right knee. 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 Large knee joint effusion.Chest x-ray showed no active cardiopulmonary disease. IV hydration was provided and hospitalist was asked to admit patient for further evaluation and management.  Assessment & Plan:   Acute kidney injury superimposed on CKD stage IV -BUN 87, creatinine 4.09 (baseline creatinine at 2.0-2.4) -Continue IV hydration -CT renal stone: Negative for any acute findings. -Renally adjust medications, avoid nephrotoxic agents/dehydration/hypotension  -kidney function-improving   Generalized weakness - possibly due to above -Continue fall  precaution -PT recommend HH PT  Right knee pain -Reviewed right knee x-ray.  Continue Tylenol as needed -Continue PT/OT eval   Hypercalcemia: Calcium 11.6 -Resolved -PTH pending   Pulmonary nodule -CT abdomen pelvis without contrast showed groundglass  nodules measuring up to 17 mm in the left lower lobe, persistent based on prior imaging. Adenocarcinoma cannot be excluded. Follow up by CT is recommended in 12 months   CAD s/p stenting -Denies ACS symptoms.  Continue aspirin, Lipitor, Lopressor, Imdur   History of CVA -Continue aspirin, Lipitor   Essential hypertension -Continue Lopressor and Imdur.  Hold losartan in the setting of AKI   Mixed hyperlipidemia -Continue Lipitor  Normocytic anemia: -Baseline hemoglobin in 9-10 range.  DVT prophylaxis: Heparin Code Status: Full code Family Communication:  None present at bedside.  Plan of care discussed with patient in length and he verbalized understanding and agreed with it. Disposition Plan: To be determined  Consultants:  None  Procedures:  None   Antimicrobials:  None  Status is: Inpatient   Subjective: Patient seen and examined.  Resting comfortably on the bed.  Denies any complaints.  Remained afebrile.  No acute events overnight.  Objective: Vitals:   03/01/22 1546 03/01/22 1940 03/02/22 0515 03/02/22 0803  BP: (!) 111/51 (!) 98/48 (!) 129/47 (!) 120/48  Pulse: 82 60 (!) 55 (!) 54  Resp: '18 19 18 18  '$ Temp: 98 F (36.7 C) 98.1 F (36.7 C) 97.9 F (36.6 C) 98.2 F (36.8 C)  TempSrc: Oral Oral Oral Oral  SpO2: (!) 84% 100% 100% 100%  Weight:      Height:        Intake/Output Summary (Last 24 hours) at 03/02/2022 0835 Last data filed at 03/02/2022 0811 Gross per 24 hour  Intake 240 ml  Output 900 ml  Net -  660 ml    Filed Weights   03/01/22 1019  Weight: 61.2 kg    Examination:  General exam: Appears calm and comfortable, on room air, thin and lean,  communicating well Respiratory  system: Clear to auscultation. Respiratory effort normal. Cardiovascular system: S1 & S2 heard, RRR. No JVD, murmurs, rubs, gallops or clicks. No pedal edema. Gastrointestinal system: Abdomen is nondistended, soft and nontender. No organomegaly or masses felt. Normal bowel sounds heard. Central nervous system: Alert and oriented. No focal neurological deficits. Extremities: Symmetric 5 x 5 power.  Right upper arm: Soft swelling noted on anterior aspect.  Likely lipoma. Skin: No rashes, lesions or ulcers Psychiatry: Judgement and insight appear normal. Mood & affect appropriate.    Data Reviewed: I have personally reviewed following labs and imaging studies  CBC: Recent Labs  Lab 02/28/22 2350 03/01/22 0810 03/02/22 0217  WBC 6.9 6.4 4.7  HGB 11.7* 10.8* 8.4*  HCT 36.4* 34.2* 25.8*  MCV 87.7 87.9 86.3  PLT 186 138* 099    Basic Metabolic Panel: Recent Labs  Lab 02/28/22 2350 03/01/22 0810 03/02/22 0217  NA 136  --  134*  K 5.1  --  5.1  CL 104  --  108  CO2 19*  --  20*  GLUCOSE 134*  --  96  BUN 87*  --  87*  CREATININE 4.09* 3.52* 3.25*  CALCIUM 11.6*  --  9.7  MG  --  1.9  --   PHOS  --  4.9*  --     GFR: Estimated Creatinine Clearance: 13.3 mL/min (A) (by C-G formula based on SCr of 3.25 mg/dL (H)). Liver Function Tests: Recent Labs  Lab 03/02/22 0217  AST 32  ALT 26  ALKPHOS 56  BILITOT 0.4  PROT 6.3*  ALBUMIN 2.2*   No results for input(s): "LIPASE", "AMYLASE" in the last 168 hours. No results for input(s): "AMMONIA" in the last 168 hours. Coagulation Profile: No results for input(s): "INR", "PROTIME" in the last 168 hours. Cardiac Enzymes: No results for input(s): "CKTOTAL", "CKMB", "CKMBINDEX", "TROPONINI" in the last 168 hours. BNP (last 3 results) No results for input(s): "PROBNP" in the last 8760 hours. HbA1C: No results for input(s): "HGBA1C" in the last 72 hours. CBG: No results for input(s): "GLUCAP" in the last 168 hours. Lipid  Profile: No results for input(s): "CHOL", "HDL", "LDLCALC", "TRIG", "CHOLHDL", "LDLDIRECT" in the last 72 hours. Thyroid Function Tests: No results for input(s): "TSH", "T4TOTAL", "FREET4", "T3FREE", "THYROIDAB" in the last 72 hours. Anemia Panel: No results for input(s): "VITAMINB12", "FOLATE", "FERRITIN", "TIBC", "IRON", "RETICCTPCT" in the last 72 hours. Sepsis Labs: No results for input(s): "PROCALCITON", "LATICACIDVEN" in the last 168 hours.  Recent Results (from the past 240 hour(s))  Resp panel by RT-PCR (RSV, Flu A&B, Covid) Anterior Nasal Swab     Status: None   Collection Time: 02/28/22 12:43 PM   Specimen: Anterior Nasal Swab  Result Value Ref Range Status   SARS Coronavirus 2 by RT PCR NEGATIVE NEGATIVE Final    Comment: (NOTE) SARS-CoV-2 target nucleic acids are NOT DETECTED.  The SARS-CoV-2 RNA is generally detectable in upper respiratory specimens during the acute phase of infection. The lowest concentration of SARS-CoV-2 viral copies this assay can detect is 138 copies/mL. A negative result does not preclude SARS-Cov-2 infection and should not be used as the sole basis for treatment or other patient management decisions. A negative result may occur with  improper specimen collection/handling, submission of specimen other than nasopharyngeal  swab, presence of viral mutation(s) within the areas targeted by this assay, and inadequate number of viral copies(<138 copies/mL). A negative result must be combined with clinical observations, patient history, and epidemiological information. The expected result is Negative.  Fact Sheet for Patients:  EntrepreneurPulse.com.au  Fact Sheet for Healthcare Providers:  IncredibleEmployment.be  This test is no t yet approved or cleared by the [place] FDA and  has been authorized for detection and/or diagnosis of SARS-CoV-2 by FDA under an Emergency Use Authorization (EUA). This EUA will  remain  in effect (meaning this test can be used) for the duration of the COVID-19 declaration under Section 564(b)(1) of the Act, 21 U.S.C.section 360bbb-3(b)(1), unless the authorization is terminated  or revoked sooner.       Influenza A by PCR NEGATIVE NEGATIVE Final   Influenza B by PCR NEGATIVE NEGATIVE Final    Comment: (NOTE) The Xpert Xpress SARS-CoV-2/FLU/RSV plus assay is intended as an aid in the diagnosis of influenza from Nasopharyngeal swab specimens and should not be used as a sole basis for treatment. Nasal washings and aspirates are unacceptable for Xpert Xpress SARS-CoV-2/FLU/RSV testing.  Fact Sheet for Patients: EntrepreneurPulse.com.au  Fact Sheet for Healthcare Providers: IncredibleEmployment.be  This test is not yet approved or cleared by the [place] FDA and has been authorized for detection and/or diagnosis of SARS-CoV-2 by FDA under an Emergency Use Authorization (EUA). This EUA will remain in effect (meaning this test can be used) for the duration of the COVID-19 declaration under Section 564(b)(1) of the Act, 21 U.S.C. section 360bbb-3(b)(1), unless the authorization is terminated or revoked.     Resp Syncytial Virus by PCR NEGATIVE NEGATIVE Final    Comment: (NOTE) Fact Sheet for Patients: EntrepreneurPulse.com.au  Fact Sheet for Healthcare Providers: IncredibleEmployment.be  This test is not yet approved or cleared by the [place] FDA and has been authorized for detection and/or diagnosis of SARS-CoV-2 by FDA under an Emergency Use Authorization (EUA). This EUA will remain in effect (meaning this test can be used) for the duration of the COVID-19 declaration under Section 564(b)(1) of the Act, 21 U.S.C. section 360bbb-3(b)(1), unless the authorization is terminated or revoked.  Performed at [facility], [place] [address] [place], [place] [address]        Radiology Studies: CT Renal Stone Study  Result Date: 03/01/2022 CLINICAL DATA:  Flank pain, question kidney stone EXAM: CT ABDOMEN AND PELVIS WITHOUT CONTRAST TECHNIQUE: Multidetector CT imaging of the abdomen and pelvis was performed following the standard protocol without IV contrast. RADIATION DOSE REDUCTION: This exam was performed according to the departmental dose-optimization program which includes automated exposure control, adjustment of the mA and/or kV according to patient size and/or use of iterative reconstruction technique. COMPARISON:  05/02/2021 FINDINGS: Lower chest: Aortic and coronary atherosclerosis. Ground-glass nodules in the lower lungs measuring up to 17 mm in the left lower lobe on 4:24, persistent based on prior. Hepatobiliary: No focal liver abnormality.Chest ectomy. Pancreas: Unremarkable. Spleen: Unremarkable. Adrenals/Urinary Tract: Negative adrenals. No hydronephrosis or stone. Unremarkable bladder. Stomach/Bowel: Enterocolonic anastomosis in the right lower quadrant. Diffuse colonic stool. No obstructive pattern of small bowel filling. Vascular/Lymphatic: No acute vascular abnormality. Extensive atheromatous plaque affecting the aorta and branch vessels. No mass or adenopathy. Reproductive:No acute finding Other: No ascites or pneumoperitoneum. Musculoskeletal: No acute abnormalities. Advanced lumbar spine degeneration with extensive endplate irregularity and disc collapse. Chronic T11 compression fracture. IMPRESSION: 1. No acute intra-abdominal finding. 2. Generalized stool retention, question constipation. 3. Ground-glass  nodules measuring up to 17 mm in the left lower lobe, persistent based on prior imaging. Adenocarcinoma cannot be excluded. Follow up by CT is recommended in 12 months, with continued annual surveillance for a minimum of 3 years. These recommendations are taken from:Recommendations for the Management of Subsolid Pulmonary Nodules Detected at CT: A  Statement from the [facility] 2013; 266:1, 304-317. 4. Extensive atherosclerosis. Electronically Signed   By: Jorje Guild M.D.   On: 03/01/2022 04:23   CT Cervical Spine Wo Contrast  Result Date: 02/28/2022 CLINICAL DATA:  Trauma EXAM: CT CERVICAL SPINE WITHOUT CONTRAST TECHNIQUE: Multidetector CT imaging of the cervical spine was performed without intravenous contrast. Multiplanar CT image reconstructions were also generated. RADIATION DOSE REDUCTION: This exam was performed according to the departmental dose-optimization program which includes automated exposure control, adjustment of the mA and/or kV according to patient size and/or use of iterative reconstruction technique. COMPARISON:  04/29/2010 FINDINGS: Alignment: Normal. Skull base and vertebrae: No acute fracture. No primary bone lesion or focal pathologic process. Soft tissues and spinal canal: No prevertebral fluid or swelling. No visible canal hematoma. Disc levels: Extensive degenerative changes identified with loss of disc spaces and marginal osteophytes throughout the C-spine similar to the 2012 study. Upper chest: Negative. Other: None. IMPRESSION: 1. No acute traumatic abnormality. 2. Extensive degenerative disc disease. Electronically Signed   By: Sammie Bench M.D.   On: 02/28/2022 14:27   CT Head Wo Contrast  Result Date: 02/28/2022 CLINICAL DATA:  Head trauma, minor (Age >= 65y) EXAM: CT HEAD WITHOUT CONTRAST TECHNIQUE: Contiguous axial images were obtained from the base of the skull through the vertex without intravenous contrast. RADIATION DOSE REDUCTION: This exam was performed according to the departmental dose-optimization program which includes automated exposure control, adjustment of the mA and/or kV according to patient size and/or use of iterative reconstruction technique. COMPARISON:  None Available. FINDINGS: Brain: There is periventricular white matter decreased attenuation consistent with small  vessel ischemic changes. Ventricles, sulci and cisterns are prominent consistent with age related involutional changes. No acute intracranial hemorrhage, mass effect or shift. No hydrocephalus. Focal encephalomalacia left basal ganglia consistent with old lacunar CVA. Vascular: No hyperdense vessel or unexpected calcification. Calvarium: No acute osseous abnormalities. There is a right frontal craniotomy. Sinuses/Orbits: No acute finding. IMPRESSION: Atrophy and chronic small vessel ischemic changes. 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 No acute intracranial process identified. Electronically Signed   By: Sammie Bench M.D.   On: 02/28/2022 14:24   DG Knee Complete 4 Views Right  Result Date: 02/28/2022 CLINICAL DATA:  Right knee pain and swelling EXAM: RIGHT KNEE - COMPLETE 4+ VIEW COMPARISON:  None Available. FINDINGS: No fracture or dislocation of the right knee. 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 There is fragmentation of the superior aspect of the patella with loose bodies in the superior joint space. Large knee joint effusion. Vascular calcinosis. IMPRESSION: 1. No fracture or dislocation of the right knee. 2. 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 3. 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 4. Large knee joint effusion. Electronically Signed   By: Delanna Ahmadi M.D.   On: 02/28/2022 13:44   DG Chest 2 View  Result Date: 02/28/2022 CLINICAL DATA:  Flu-like symptoms EXAM: CHEST - 2 VIEW COMPARISON:  11/30/2019 FINDINGS: The heart size  and mediastinal contours are within normal limits. Both lungs are clear. The visualized skeletal structures are unremarkable. IMPRESSION: No active cardiopulmonary disease. Electronically Signed   By: Elmer Picker M.D.   On:  02/28/2022 13:41    Scheduled Meds:  aspirin EC  81 mg Oral Daily   atorvastatin  80 mg Oral Daily   heparin  5,000 Units Subcutaneous Q8H   isosorbide mononitrate  120 mg Oral Daily   metoprolol tartrate  25 mg Oral BID   Continuous Infusions:  sodium chloride       LOS: 1 day   Time spent: 30 minutes   Corran Lalone Loann Quill, MD [facility]  If 7PM-7AM, please contact night-coverage [email or web address] 03/02/2022, 8:35 AM

## 2022-03-03 DIAGNOSIS — N179 Acute kidney failure, unspecified: Secondary | ICD-10-CM | POA: Diagnosis not present

## 2022-03-03 LAB — BASIC METABOLIC PANEL
Anion gap: 8 (ref 5–15)
Anion gap: 5 (ref 5–15)
BUN: 70 mg/dL — ABNORMAL HIGH (ref 8–23)
BUN: 63 mg/dL — ABNORMAL HIGH (ref 8–23)
CO2: 15 mmol/L — ABNORMAL LOW (ref 22–32)
CO2: 21 mmol/L — ABNORMAL LOW (ref 22–32)
Calcium: 9.5 mg/dL (ref 8.9–10.3)
Calcium: 9.6 mg/dL (ref 8.9–10.3)
Chloride: 114 mmol/L — ABNORMAL HIGH (ref 98–111)
Chloride: 111 mmol/L (ref 98–111)
Creatinine, Ser: 2.58 mg/dL — ABNORMAL HIGH (ref 0.61–1.24)
Creatinine, Ser: 2.5 mg/dL — ABNORMAL HIGH (ref 0.61–1.24)
GFR, Estimated: 23 mL/min — ABNORMAL LOW (ref >60)
GFR, Estimated: 24 mL/min — ABNORMAL LOW (ref >60)
Glucose, Bld: 84 mg/dL (ref 70–99)
Glucose, Bld: 109 mg/dL — ABNORMAL HIGH (ref 70–99)
Potassium: 5.6 mmol/L — ABNORMAL HIGH (ref 3.5–5.1)
Potassium: 4.9 mmol/L (ref 3.5–5.1)
Sodium: 137 mmol/L (ref 135–145)
Sodium: 137 mmol/L (ref 135–145)

## 2022-03-03 LAB — SYNOVIAL CELL COUNT + DIFF, W/ CRYSTALS
Lymphocytes-Synovial Fld: 16 % (ref 0–20)
Monocyte-Macrophage-Synovial Fluid: 6 % — ABNORMAL LOW (ref 50–90)
Neutrophil, Synovial: 78 % — ABNORMAL HIGH (ref 0–25)
WBC, Synovial: 1750 /mm3 — ABNORMAL HIGH (ref 0–200)

## 2022-03-03 MED ORDER — SODIUM BICARBONATE 8.4 % IV SOLN
INTRAVENOUS | Status: DC
  Filled 2022-03-03 (×4): qty 1000

## 2022-03-03 MED ORDER — BUPIVACAINE HCL (PF) 0.5 % IJ SOLN
10.0000 mL | Freq: Once | INTRAMUSCULAR | Status: AC
  Administered 2022-03-03: 10 mL
  Filled 2022-03-03: qty 10

## 2022-03-03 MED ORDER — LORAZEPAM 2 MG/ML IJ SOLN
1.0000 mg | Freq: Once | INTRAMUSCULAR | Status: AC
  Administered 2022-03-04: 1 mg via INTRAVENOUS
  Filled 2022-03-03: qty 1

## 2022-03-03 MED ORDER — AMLODIPINE BESYLATE 10 MG PO TABS
10.0000 mg | ORAL_TABLET | Freq: Every evening | ORAL | Status: DC
  Administered 2022-03-03 – 2022-03-05 (×2): 10 mg via ORAL
  Filled 2022-03-03 (×2): qty 1

## 2022-03-03 MED ORDER — INSULIN ASPART 100 UNIT/ML IV SOLN
5.0000 [IU] | Freq: Once | INTRAVENOUS | Status: AC
  Administered 2022-03-03: 5 [IU] via INTRAVENOUS

## 2022-03-03 MED ORDER — METHYLPREDNISOLONE ACETATE 40 MG/ML IJ SUSP
40.0000 mg | Freq: Once | INTRAMUSCULAR | Status: AC
  Administered 2022-03-03: 40 mg via INTRA_ARTICULAR
  Filled 2022-03-03: qty 1

## 2022-03-03 MED ORDER — HYDRALAZINE HCL 50 MG PO TABS
100.0000 mg | ORAL_TABLET | Freq: Two times a day (BID) | ORAL | Status: DC
  Administered 2022-03-04 – 2022-03-06 (×4): 100 mg via ORAL
  Filled 2022-03-03 (×6): qty 2

## 2022-03-03 MED ORDER — DEXTROSE 50 % IV SOLN
1.0000 | Freq: Once | INTRAVENOUS | Status: AC
  Administered 2022-03-03: 50 mL via INTRAVENOUS
  Filled 2022-03-03: qty 50

## 2022-03-03 NOTE — TOC Initial Note (Signed)
Transition of Care [facility]) - Initial/Assessment Note    Patient Details  Name: [name] MRN: [id number] Date of Birth: Jul 29, 1930  [facility]) CM/SW Contact:    Pollie Friar, RN Phone Number: 03/03/2022, 2:46 PM  Clinical Narrative:                 Pt is from home alone. He has caregivers through [facility] [facility] care M-F from 9:30 am to 2 pm. Pt states they assist at home and remind him of his medications.  Pt seems to be estranged from most of family by conversation about transportation. One daughter was at the bedside but she is not able to provide the supervision he needs. She will provide transport home if needed: Laurena Bering [phone] Pt has been driving self as needed.  Current recommendations are for SNF rehab. Pt is reluctant but agreeable to being faxed out in the [facility] area. Will be provided bed offers when available.  TOC following.   Expected Discharge Plan: Skilled Nursing Facility Barriers to Discharge: Continued Medical Work up   Patient Goals and CMS Choice   CMS Medicare.gov Compare Post Acute Care list provided to:: Patient Choice offered to / list presented to : Patient      Expected Discharge Plan and Services In-house Referral: Clinical Social Work Discharge Planning Services: CM Consult Post Acute Care Choice: [place] Living arrangements for the past 2 months: Apartment                                      Prior Living Arrangements/Services Living arrangements for the past 2 months: Apartment Lives with:: Self Patient language and need for interpreter reviewed:: Yes Do you feel safe going back to the place where you live?: Yes      Need for Family Participation in Patient Care: Yes (Comment) Care giver support system in place?: No (comment) Current home services: DME (walker/ shower seat) Criminal Activity/Legal Involvement Pertinent to Current Situation/Hospitalization: No - Comment as needed  Activities  of Daily Living Home Assistive Devices/Equipment: Walker (specify type) ADL Screening (condition at time of admission) Patient's cognitive ability adequate to safely complete daily activities?: No Is the patient deaf or have difficulty hearing?: No Does the patient have difficulty seeing, even when wearing glasses/contacts?: No Does the patient have difficulty concentrating, remembering, or making decisions?: Yes Patient able to express need for assistance with ADLs?: Yes Does the patient have difficulty dressing or bathing?: Yes Independently performs ADLs?: Yes (appropriate for developmental age) Does the patient have difficulty walking or climbing stairs?: Yes Weakness of Legs: Both Weakness of Arms/Hands: Both  Permission Sought/Granted                  Emotional Assessment Appearance:: Appears stated age Attitude/Demeanor/Rapport: Engaged Affect (typically observed): Accepting Orientation: : Oriented to Self, Oriented to Place   Psych Involvement: No (comment)  Admission diagnosis:  Dehydration [E86.0] Weakness [R53.1] AKI (acute kidney injury) ([place]) [N17.9] Acute kidney injury ([place]) [N17.9] Patient Active Problem List   Diagnosis Date Noted   Acute kidney injury ([place]) 03/01/2022   Generalized weakness 03/01/2022   Right knee pain 03/01/2022   Pulmonary nodule 03/01/2022   Hypercalcemia 03/01/2022   History of CVA (cerebrovascular accident) 03/01/2022   Incarcerated inguinal hernia 05/02/2021   Incarcerated right inguinal hernia 05/02/2021   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 ([place])    Right inguinal  hernia 11/29/2019   Decreased hearing, bilateral 11/29/2019   Stage 3b chronic kidney disease ([place]) 08/29/2019   Hyperparathyroidism ([place]) 06/20/2019   Atypical chest pain 01/20/2018   Right carotid bruit 07/07/2017   Tobacco abuse 05/26/2017   Chest pain 04/27/2016   Unstable angina ([place]) 04/26/2016   Cerebellar ataxia ([place]) 10/02/2015   Ataxic gait  09/18/2015   History of prostate cancer 09/18/2015   Prediabetes 07/29/2015   CKD (chronic kidney disease), stage IV (HCC) 04/19/2015   Chronic diastolic HF (heart failure) (HCC)    COPD (chronic obstructive pulmonary disease) ([place]) 02/10/2014   Coronary artery disease 01/25/2013    Class: Chronic   Essential hypertension 01/25/2013    Class: Chronic   Mixed hyperlipidemia 01/25/2013    Class: Chronic   History of colon cancer 05/19/2006   Kidney stones 1952   PCP:  Mack Hook, MD Pharmacy:   [facility] [place], [place] - [address] [place] [address] Phone: [phone] Fax: [facility] [address] [place] [address] Phone: [phone] Fax: [phone]     Social Determinants of Health (SDOH) Social History: SDOH Screenings   Food Insecurity: No Food Insecurity (05/11/2021)  Housing: Low Risk  (05/11/2021)  Transportation Needs: Unknown (05/11/2021)  Tobacco Use: High Risk (02/28/2022)   SDOH Interventions:     Readmission Risk Interventions     No data to display

## 2022-03-03 NOTE — Progress Notes (Signed)
PROGRESS NOTE    [name]  VXB:[id number] DOB: Oct 08, 1930 DOA: 02/28/2022 PCP: Mack Hook, MD   Brief Narrative:  [name] is a 88 y.o. male with medical history significant of hypertension, CKD stage 4, CAD s/p stenting, stroke, hyperparathyroidism who presents to the emergency department due to 3-day onset of flulike symptoms and generalized weakness.    ED Course:  In the emergency department, patient was hemodynamically stable with vital signs being within normal range, BP was 128/56.  Workup in the ED showed normocytic anemia, BMP showed sodium 136, potassium 5.1, chloride 104, bicarb 19, blood glucose 134, BUN 87, creatinine 4.09 (baseline creatinine at 2.0-2.4), calcium 11.6.  Urinalysis was normal.  Influenza A, B, SARS coronavirus 2, RSV was negative.CT abdomen and pelvis without contrast showed no acute intra-abdominal finding.CT cervical spine without contrast showed no acute traumatic abnormality, but showed extensive degenerative disc disease.CT head without contrast showed no acute intracranial process identified Right knee x-ray showed no fracture or dislocation of the right knee. 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 Large knee joint effusion.Chest x-ray showed no active cardiopulmonary disease. IV hydration was provided and hospitalist was asked to admit patient for further evaluation and management.  Assessment & Plan:   Acute kidney injury superimposed on CKD stage IV -BUN 87, creatinine 4.09 (baseline creatinine at 2.0-2.4). today down to 2.58 -Continue IV hydration -CT renal stone: Negative for any acute findings. -Renally adjust medications, avoid nephrotoxic agents/dehydration/hypotension  -kidney function-improving  Hyperkalemia K 5.6 today, likely 2/2 kidney  dysfunction, bicarb also low - switch fluids to bicarb - insulin with dextrose ordered - repeat labs this pm   Generalized weakness - possibly due to above -Continue fall precaution -PT recommend HH PT  Right knee pain with effusion -Reviewed right knee x-ray which shows arthrosis, and large effusion.  Continue Tylenol as needed. Denies hx gout -will ask ortho to see, consider tapping that knee  - will order uric acid for the morning  Hypercalcemia: Calcium 11.6 -Resolved -PTH pending   Pulmonary nodule -CT abdomen pelvis without contrast showed groundglass  nodules measuring up to 17 mm in the left lower lobe, persistent based on prior imaging. Adenocarcinoma cannot be excluded. Follow up by CT is recommended in 12 months   CAD s/p stenting -Denies ACS symptoms.  Continue aspirin, Lipitor, Lopressor, Imdur   History of CVA -Continue aspirin, Lipitor   Essential hypertension Bp elevated today Will resume home amlodipine and hydralazine. Losartan on hold 2/2 aki   Mixed hyperlipidemia -Continue Lipitor  Normocytic anemia: -Baseline hemoglobin in 9-10 range.  DVT prophylaxis: Heparin Code Status: Full code Family Communication:  granddaughter updated @ bedside 12/26 Disposition Plan: To be determined  Consultants:  None  Procedures:  None   Antimicrobials:  None  Status is: Inpatient   Subjective: Patient seen and examined.  Resting comfortably on the bed. Says right knee still bothering him  Objective: Vitals:   03/02/22 1616 03/02/22 2023 03/03/22 0551 03/03/22 0726  BP: 117/61 (!) 129/96 (!) 145/72 (!) 168/60  Pulse: 61 (!) 51 (!) 57 (!) 53  Resp: '18 17 17 18  '$ Temp: 98 F (36.7 C) 99.1 F (37.3 C) 98 F (36.7 C) 97.6 F (36.4 C)  TempSrc: Oral Oral  Oral  SpO2: 100% 100% 100% 100%  Weight:      Height:  Intake/Output Summary (Last 24 hours) at 03/03/2022 1205 Last data filed at 03/03/2022 0457 Gross per 24 hour  Intake 1706.17 ml   Output 1 ml  Net 1705.17 ml   Filed Weights   03/01/22 1019  Weight: 61.2 kg    Examination:  General exam: Appears calm and comfortable, on room air, thin and lean,  communicating well Respiratory system: Clear to auscultation. Respiratory effort normal. Cardiovascular system: S1 & S2 heard, RRR. No JVD, murmurs, rubs, gallops or clicks. No pedal edema. Gastrointestinal system: Abdomen is nondistended, soft and nontender. No organomegaly or masses felt. Normal bowel sounds heard. Central nervous system: Alert and oriented. No focal neurological deficits. Extremities: Symmetric 5 x 5 power.  Right upper arm: Soft swelling noted on anterior aspect.  Likely lipoma. Skin: No rashes, lesions or ulcers Psychiatry: Judgement and insight appear normal. Mood & affect appropriate.    Data Reviewed: I have personally reviewed following labs and imaging studies  CBC: Recent Labs  Lab 02/28/22 2350 03/01/22 0810 03/02/22 0217  WBC 6.9 6.4 4.7  HGB 11.7* 10.8* 8.4*  HCT 36.4* 34.2* 25.8*  MCV 87.7 87.9 86.3  PLT 186 138* 329   Basic Metabolic Panel: Recent Labs  Lab 02/28/22 2350 03/01/22 0810 03/02/22 0217 03/03/22 0300  NA 136  --  134* 137  K 5.1  --  5.1 5.6*  CL 104  --  108 114*  CO2 19*  --  20* 15*  GLUCOSE 134*  --  96 84  BUN 87*  --  87* 70*  CREATININE 4.09* 3.52* 3.25* 2.58*  CALCIUM 11.6*  --  9.7 9.5  MG  --  1.9  --   --   PHOS  --  4.9*  --   --    GFR: Estimated Creatinine Clearance: 16.8 mL/min (A) (by C-G formula based on SCr of 2.58 mg/dL (H)). Liver Function Tests: Recent Labs  Lab 03/02/22 0217  AST 32  ALT 26  ALKPHOS 56  BILITOT 0.4  PROT 6.3*  ALBUMIN 2.2*   No results for input(s): "LIPASE", "AMYLASE" in the last 168 hours. No results for input(s): "AMMONIA" in the last 168 hours. Coagulation Profile: No results for input(s): "INR", "PROTIME" in the last 168 hours. Cardiac Enzymes: No results for input(s): "CKTOTAL", "CKMB",  "CKMBINDEX", "TROPONINI" in the last 168 hours. BNP (last 3 results) No results for input(s): "PROBNP" in the last 8760 hours. HbA1C: No results for input(s): "HGBA1C" in the last 72 hours. CBG: No results for input(s): "GLUCAP" in the last 168 hours. Lipid Profile: No results for input(s): "CHOL", "HDL", "LDLCALC", "TRIG", "CHOLHDL", "LDLDIRECT" in the last 72 hours. Thyroid Function Tests: No results for input(s): "TSH", "T4TOTAL", "FREET4", "T3FREE", "THYROIDAB" in the last 72 hours. Anemia Panel: No results for input(s): "VITAMINB12", "FOLATE", "FERRITIN", "TIBC", "IRON", "RETICCTPCT" in the last 72 hours. Sepsis Labs: No results for input(s): "PROCALCITON", "LATICACIDVEN" in the last 168 hours.  Recent Results (from the past 240 hour(s))  Resp panel by RT-PCR (RSV, Flu A&B, Covid) Anterior Nasal Swab     Status: None   Collection Time: 02/28/22 12:43 PM   Specimen: Anterior Nasal Swab  Result Value Ref Range Status   SARS Coronavirus 2 by RT PCR NEGATIVE NEGATIVE Final    Comment: (NOTE) SARS-CoV-2 target nucleic acids are NOT DETECTED.  The SARS-CoV-2 RNA is generally detectable in upper respiratory specimens during the acute phase of infection. The lowest concentration of SARS-CoV-2 viral copies this assay can detect is 138  copies/mL. A negative result does not preclude SARS-Cov-2 infection and should not be used as the sole basis for treatment or other patient management decisions. A negative result may occur with  improper specimen collection/handling, submission of specimen other than nasopharyngeal swab, presence of viral mutation(s) within the areas targeted by this assay, and inadequate number of viral copies(<138 copies/mL). A negative result must be combined with clinical observations, patient history, and epidemiological information. The expected result is Negative.  Fact Sheet for Patients:  EntrepreneurPulse.com.au  Fact Sheet for Healthcare  Providers:  IncredibleEmployment.be  This test is no t yet approved or cleared by the [place] FDA and  has been authorized for detection and/or diagnosis of SARS-CoV-2 by FDA under an Emergency Use Authorization (EUA). This EUA will remain  in effect (meaning this test can be used) for the duration of the COVID-19 declaration under Section 564(b)(1) of the Act, 21 U.S.C.section 360bbb-3(b)(1), unless the authorization is terminated  or revoked sooner.       Influenza A by PCR NEGATIVE NEGATIVE Final   Influenza B by PCR NEGATIVE NEGATIVE Final    Comment: (NOTE) The Xpert Xpress SARS-CoV-2/FLU/RSV plus assay is intended as an aid in the diagnosis of influenza from Nasopharyngeal swab specimens and should not be used as a sole basis for treatment. Nasal washings and aspirates are unacceptable for Xpert Xpress SARS-CoV-2/FLU/RSV testing.  Fact Sheet for Patients: EntrepreneurPulse.com.au  Fact Sheet for Healthcare Providers: IncredibleEmployment.be  This test is not yet approved or cleared by the [place] FDA and has been authorized for detection and/or diagnosis of SARS-CoV-2 by FDA under an Emergency Use Authorization (EUA). This EUA will remain in effect (meaning this test can be used) for the duration of the COVID-19 declaration under Section 564(b)(1) of the Act, 21 U.S.C. section 360bbb-3(b)(1), unless the authorization is terminated or revoked.     Resp Syncytial Virus by PCR NEGATIVE NEGATIVE Final    Comment: (NOTE) Fact Sheet for Patients: EntrepreneurPulse.com.au  Fact Sheet for Healthcare Providers: IncredibleEmployment.be  This test is not yet approved or cleared by the [place] FDA and has been authorized for detection and/or diagnosis of SARS-CoV-2 by FDA under an Emergency Use Authorization (EUA). This EUA will remain in effect (meaning this test can be  used) for the duration of the COVID-19 declaration under Section 564(b)(1) of the Act, 21 U.S.C. section 360bbb-3(b)(1), unless the authorization is terminated or revoked.  Performed at [facility], [place] [address], [place], [place] [address]       Radiology Studies: No results found.  Scheduled Meds:  amLODipine  10 mg Oral QPM   aspirin EC  81 mg Oral Daily   atorvastatin  80 mg Oral Daily   heparin  5,000 Units Subcutaneous Q8H   hydrALAZINE  100 mg Oral BID   isosorbide mononitrate  120 mg Oral Daily   metoprolol tartrate  25 mg Oral BID   Continuous Infusions:  sodium bicarbonate 150 mEq in dextrose 5 % 1,150 mL infusion       LOS: 2 days   Time spent: 30 minutes   Desma Maxim, MD [facility]  If 7PM-7AM, please contact night-coverage [email or web address] 03/03/2022, 12:05 PM

## 2022-03-03 NOTE — Progress Notes (Addendum)
Physical Therapy Treatment Patient Details Name: [name] MRN: [id number] DOB: Sep 16, 1930 Today's Date: 03/03/2022   History of Present Illness [name] is a 88 y.o. male with medical history significant of HTN, CKD stage 4, CAD s/p stenting, stroke, hyperparathyroidism who presents to the ED due to 3-day onset of flulike symptoms and generalized weakness and fall at home with R knee pain. Found to have right knee effusion.    PT Comments    Patient progressing slowly towards PT goals. Session focused on progressive ambulation and functional mobility. Requires min A for standing and min A for gait training with use of RW for support due to ataxia, weakness and balance deficits. Pt appears slightly confused today, asking about needing a new place to live. Pt is not safe to return home alone at this time due to deficits putting him at increased risk for falls. If pt does not have 24/7 supervision/assist for mobility, will require SNF. Will follow.   Recommendations for follow up therapy are one component of a multi-disciplinary discharge planning process, led by the attending physician.  Recommendations may be updated based on patient status, additional functional criteria and insurance authorization.  Follow Up Recommendations  SNF     Assistance Recommended at Discharge Frequent or constant Supervision/Assistance  Patient can return home with the following A little help with walking and/or transfers;A lot of help with bathing/dressing/bathroom;Assist for transportation;Assistance with cooking/housework;Help with stairs or ramp for entrance;Assistance with feeding   Equipment Recommendations  None recommended by PT    Recommendations for Other Services       Precautions / Restrictions Precautions Precautions: Fall;Other (comment) Precaution Comments: ataxic Restrictions Weight Bearing Restrictions: No     Mobility  Bed Mobility Overal bed mobility: Needs Assistance Bed  Mobility: Supine to Sit     Supine to sit: Min guard, HOB elevated     General bed mobility comments: Increased time to get to EOB.    Transfers Overall transfer level: Needs assistance Equipment used: Rolling walker (2 wheels) Transfers: Sit to/from Stand Sit to Stand: Min assist           General transfer comment: Min A to power to standing with cues for hand placement/technique, stood from EOB x2, ataxic.    Ambulation/Gait Ambulation/Gait assistance: Min assist Gait Distance (Feet): 60 Feet Assistive device: Rolling walker (2 wheels) Gait Pattern/deviations: Step-through pattern, Ataxic, Decreased stride length, Wide base of support, Trunk flexed Gait velocity: decreased Gait velocity interpretation: <1.31 ft/sec, indicative of household ambulator   General Gait Details: Slow, unsteady ataxic gait with Min A for balance and use of RW. 2 short standing rest breaks, assist with turning. Right knee instability and hyperextension thrust noted x2.   Stairs             Wheelchair Mobility    Modified Rankin (Stroke Patients Only)       Balance Overall balance assessment: Needs assistance Sitting-balance support: Feet supported, No upper extremity supported Sitting balance-Leahy Scale: Good     Standing balance support: During functional activity, Bilateral upper extremity supported Standing balance-Leahy Scale: Poor Standing balance comment: Requires UE support on Rw                            Cognition Arousal/Alertness: Awake/alert Behavior During Therapy: WFL for tasks assessed/performed Overall Cognitive Status: No family/caregiver present to determine baseline cognitive functioning  General Comments: oriented to self only, needs increased cues for sequencing tasks and repetition at times, appears slightly confused.        Exercises      General Comments General comments (skin integrity,  edema, etc.): VSS on RA      Pertinent Vitals/Pain Pain Assessment Pain Assessment: Faces Faces Pain Scale: Hurts even more Pain Location: R knee with mobility Pain Descriptors / Indicators: Discomfort, Aching, Sore, Guarding, Grimacing Pain Intervention(s): Monitored during session, Repositioned, Limited activity within patient's tolerance    Home Living Family/patient expects to be discharged to:: Private residence Living Arrangements: Alone Available Help at Discharge: Other (Comment) Type of Home: House Home Access: Level entry       Home Layout: One level Home Equipment: Rollator (4 wheels);Wheelchair - power;Shower seat      Prior Function            PT Goals (current goals can now be found in the care plan section) Progress towards PT goals: Progressing toward goals    Frequency    Min 3X/week      PT Plan Current plan remains appropriate    Co-evaluation              AM-PAC PT "6 Clicks" Mobility   Outcome Measure  Help needed turning from your back to your side while in a flat bed without using bedrails?: A Little Help needed moving from lying on your back to sitting on the side of a flat bed without using bedrails?: A Little Help needed moving to and from a bed to a chair (including a wheelchair)?: A Little Help needed standing up from a chair using your arms (e.g., wheelchair or bedside chair)?: A Little Help needed to walk in hospital room?: A Little Help needed climbing 3-5 steps with a railing? : Total 6 Click Score: 16    End of Session Equipment Utilized During Treatment: Gait belt Activity Tolerance: Patient tolerated treatment well;Patient limited by pain Patient left: in bed;with call bell/phone within reach;with bed alarm set (sitting EOB) Nurse Communication: Mobility status PT Visit Diagnosis: Muscle weakness (generalized) (M62.81);History of falling (Z91.81);Other symptoms and signs involving the nervous system  (R29.898);Pain Pain - Right/Left: Right Pain - part of body: Knee     Time: 1829-9371 PT Time Calculation (min) (ACUTE ONLY): 25 min  Charges:  $Gait Training: 8-22 mins $Therapeutic Activity: 8-22 mins                     Marisa Severin, PT, DPT Acute Rehabilitation Services Secure chat preferred Office [place] 03/03/2022, 10:00 AM
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
## 2022-03-03 NOTE — Procedures (Signed)
Procedure: Right knee aspiration and injection   Indication: Right knee effusion(s)   Surgeon: Silvestre Gunner, PA-C   Assist: None   Anesthesia: Topical refrigerant   EBL: None   Complications: None   Findings: After risks/benefits explained patient desires to undergo procedure. Consent obtained and time out performed. The right knee was sterilely prepped and aspirated. 24m opaque yellow fluid with copious tophaceous material obtained. 658m0.5% Marcaine and '40mg'$  depo-medrol instilled. Pt tolerated the procedure well.       MiLisette AbuPA-C Orthopedic Surgery [phone]

## 2022-03-03 NOTE — Evaluation (Signed)
Occupational Therapy Evaluation Patient Details Name: [name] MRN: [id number] DOB: April 01, 1930 Today's Date: 03/03/2022   History of Present Illness [name] is a 88 y.o. male with medical history significant of hypertension, CKD stage 4, CAD s/p stenting, stroke, hyperparathyroidism who presents to the emergency department due to 3-day onset of flulike symptoms and generalized weakness and fall at home with R knee pain.   Clinical Impression   Pt questionable historian, reports living alone but has assist for IADLs, some ADLs per chart review. Pt uses RW for mobility. Pt currently needing min-mod A for ADLs, min guard for bed mobility, and min A for transfers/short distance mobility with RW. Pt with decreased cognition, needing frequent cues for safety/sequencing. Pt presenting with impairments listed below, will follow acutely. Recommend SNF at d/c, unless pt is able to have 24/7 level of assist needed, then could go home with [facility].     Recommendations for follow up therapy are one component of a multi-disciplinary discharge planning process, led by the attending physician.  Recommendations may be updated based on patient status, additional functional criteria and insurance authorization.   Follow Up Recommendations  Skilled nursing-short term rehab (<3 hours/day) (unless pt has 24/7 level of assist needed at home, then can go home with [facility])     Assistance Recommended at Discharge Frequent or constant Supervision/Assistance  Patient can return home with the following A little help with walking and/or transfers;A lot of help with bathing/dressing/bathroom;Assistance with cooking/housework;Direct supervision/assist for medications management;Direct supervision/assist for financial management;Assist for transportation;Help with stairs or ramp for entrance    Functional Status Assessment  Patient has had a recent decline in their functional status and demonstrates the ability to make  significant improvements in function in a reasonable and predictable amount of time.  Equipment Recommendations  BSC/3in1    Recommendations for Other Services PT consult     Precautions / Restrictions Precautions Precautions: Fall Precaution Comments: ataxic Restrictions Weight Bearing Restrictions: No      Mobility Bed Mobility Overal bed mobility: Needs Assistance Bed Mobility: Sit to Supine       Sit to supine: Min guard   General bed mobility comments: assist for bringing BLE's into bed    Transfers Overall transfer level: Needs assistance Equipment used: Rolling walker (2 wheels) Transfers: Sit to/from Stand Sit to Stand: Min assist                  Balance Overall balance assessment: Needs assistance Sitting-balance support: Bilateral upper extremity supported Sitting balance-Leahy Scale: Good     Standing balance support: Reliant on assistive device for balance Standing balance-Leahy Scale: Poor                             ADL either performed or assessed with clinical judgement   ADL Overall ADL's : Needs assistance/impaired Eating/Feeding: Set up;Sitting   Grooming: Set up;Sitting   Upper Body Bathing: Moderate assistance   Lower Body Bathing: Moderate assistance   Upper Body Dressing : Moderate assistance   Lower Body Dressing: Moderate assistance   Toilet Transfer: Minimal assistance;Rolling walker (2 wheels);Ambulation;Regular Toilet   Toileting- Clothing Manipulation and Hygiene: Minimal assistance       Functional mobility during ADLs: Minimal assistance;Rolling walker (2 wheels)       Vision   Vision Assessment?: No apparent visual deficits     Perception Perception Perception Tested?: No   Praxis Praxis Praxis tested?: Not tested  Pertinent Vitals/Pain Pain Assessment Pain Assessment: Faces Pain Score: 4  Faces Pain Scale: Hurts little more Pain Location: R knee with mobility Pain Descriptors /  Indicators: Discomfort, Aching Pain Intervention(s): Limited activity within patient's tolerance, Monitored during session     Hand Dominance     Extremity/Trunk Assessment Upper Extremity Assessment Upper Extremity Assessment: Generalized weakness;RUE deficits/detail RUE Deficits / Details: mildly weaker than LUE, hx of weakness from prior CVA, grasp and ROM are functional RUE Coordination: decreased fine motor   Lower Extremity Assessment Lower Extremity Assessment: Generalized weakness   Cervical / Trunk Assessment Cervical / Trunk Assessment: Kyphotic   Communication Communication Communication: No difficulties   Cognition Arousal/Alertness: Awake/alert Behavior During Therapy: WFL for tasks assessed/performed, Impulsive Overall Cognitive Status: No family/caregiver present to determine baseline cognitive functioning                                 General Comments: oriented to self only, needs increased cues for sequencing tasks     General Comments  VSS on RA    Exercises     Shoulder Instructions      Home Living Family/patient expects to be discharged to:: Private residence Living Arrangements: Alone Available Help at Discharge: Other (Comment) Type of Home: House Home Access: Level entry     Home Layout: One level     Bathroom Shower/Tub: Tub/shower unit;Curtain   Biochemist, clinical: Standard     Home Equipment: Rollator (4 wheels);Wheelchair - power;Shower seat          Prior Functioning/Environment Prior Level of Function : Needs assist             Mobility Comments: reports RW ADLs Comments: daughter assists with bathing ([place]ne) and dressing, groceries, meals        OT Problem List: Decreased strength;Decreased range of motion;Decreased activity tolerance;Impaired balance (sitting and/or standing);Decreased cognition;Decreased safety awareness;Impaired UE functional use      OT Treatment/Interventions: Self-care/ADL  training;Therapeutic exercise;DME and/or AE instruction;Therapeutic activities;Patient/family education;Balance training    OT Goals(Current goals can be found in the care plan section) Acute Rehab OT Goals Patient Stated Goal: none stated OT Goal Formulation: With patient Time For Goal Achievement: 03/17/22 Potential to Achieve Goals: Good ADL Goals Pt Will Perform Upper Body Dressing: with min guard assist;sitting Pt Will Perform Lower Body Dressing: with min guard assist;sit to/from stand Pt Will Transfer to Toilet: with min guard assist;ambulating;regular height toilet  OT Frequency: Min 2X/week    Co-evaluation              AM-PAC OT "6 Clicks" Daily Activity     Outcome Measure Help from another person eating meals?: A Little Help from another person taking care of personal grooming?: A Little Help from another person toileting, which includes using toliet, bedpan, or urinal?: A Lot Help from another person bathing (including washing, rinsing, drying)?: A Lot Help from another person to put on and taking off regular upper body clothing?: A Lot Help from another person to put on and taking off regular lower body clothing?: A Lot 6 Click Score: 14   End of Session Equipment Utilized During Treatment: Gait belt;Rolling walker (2 wheels) Nurse Communication: Mobility status  Activity Tolerance: Patient tolerated treatment well Patient left: in bed;with call bell/phone within reach;with bed alarm set  OT Visit Diagnosis: Unsteadiness on feet (R26.81);Other abnormalities of gait and mobility (R26.89);Muscle weakness (generalized) (M62.81)  Time: 7902-4097 OT Time Calculation (min): 16 min Charges:  OT General Charges $OT Visit: 1 Visit OT Evaluation $OT Eval Moderate Complexity: 1 Mod  Karry Barrilleaux K, OTD, OTR/L SecureChat Preferred Acute Rehab [phone]  Addalie Calles K Koonce 03/03/2022, 9:06 AM

## 2022-03-03 NOTE — NC FL2 (Signed)
[place] LEVEL OF CARE FORM     IDENTIFICATION  Patient Name: [name] Birthdate: 1931/02/20 Sex: male Admission Date (Current Location): 02/28/2022  [facility] and [place] Number:  Herbalist and Address:  [facility] [place] [address] [place], [place] [address]      Provider Number: [id number]  Attending Physician Name and Address:  Gwynne Edinger, MD  Relative Name and Phone Number:       Current Level of Care: SNF Recommended Level of Care: Mobile Prior Approval Number:    Date Approved/Denied:   PASRR Number: [id number]  Discharge Plan: SNF    Current Diagnoses: Patient Active Problem List   Diagnosis Date Noted   Acute kidney injury ([place]) 03/01/2022   Generalized weakness 03/01/2022   Right knee pain 03/01/2022   Pulmonary nodule 03/01/2022   Hypercalcemia 03/01/2022   History of CVA (cerebrovascular accident) 03/01/2022   Incarcerated inguinal hernia 05/02/2021   Incarcerated right inguinal hernia 05/02/2021   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 ([place])    Right inguinal hernia 11/29/2019   Decreased hearing, bilateral 11/29/2019   Stage 3b chronic kidney disease ([place]) 08/29/2019   Hyperparathyroidism ([place]) 06/20/2019   Atypical chest pain 01/20/2018   Right carotid bruit 07/07/2017   Tobacco abuse 05/26/2017   Chest pain 04/27/2016   Unstable angina ([place]) 04/26/2016   Cerebellar ataxia ([place]) 10/02/2015   Ataxic gait 09/18/2015   History of prostate cancer 09/18/2015   Prediabetes 07/29/2015   CKD (chronic kidney disease), stage IV (HCC) 04/19/2015   Chronic diastolic HF (heart failure) (HCC)    COPD (chronic obstructive pulmonary disease) ([place]) 02/10/2014   Coronary artery disease 01/25/2013   Essential hypertension 01/25/2013   Mixed hyperlipidemia 01/25/2013   History of colon cancer 05/19/2006   Kidney stones 1954    Orientation RESPIRATION BLADDER Height & Weight      Self, Time, Situation, Place  Normal Continent Weight: 134 lb 14.7 oz (61.2 kg) Height:  5' 5.51" (166.4 cm)  BEHAVIORAL SYMPTOMS/MOOD NEUROLOGICAL BOWEL NUTRITION STATUS      Continent Diet  AMBULATORY STATUS COMMUNICATION OF NEEDS Skin   Limited Assist Verbally Normal                       Personal Care Assistance Level of Assistance  Bathing, Feeding, Dressing Bathing Assistance: Limited assistance Feeding assistance: Independent Dressing Assistance: Limited assistance     Functional Limitations Info  Sight, Hearing, Speech Sight Info: Adequate Hearing Info: Adequate Speech Info: Adequate    SPECIAL CARE FACTORS FREQUENCY  PT (By licensed PT), OT (By licensed OT)     PT Frequency: 5x weekly OT Frequency: 5x weekly            Contractures Contractures Info: Not present    Additional Factors Info  Code Status, Allergies Code Status Info: Full Allergies Info: No known allergies           Current Medications (03/03/2022):  This is the current hospital active medication list Current Facility-Administered Medications  Medication Dose Route Frequency Provider Last Rate Last Admin   acetaminophen (TYLENOL) tablet 650 mg  650 mg Oral Q6H PRN Adefeso, Oladapo, DO   650 mg at 03/01/22 1811   Or   acetaminophen (TYLENOL) suppository 650 mg  650 mg Rectal Q6H PRN Adefeso, Oladapo, DO       albuterol (PROVENTIL) (2.5 MG/3ML) 0.083% nebulizer solution 2.5 mg  2.5 mg Nebulization  Z6X PRN Jeneen Rinks, RPH       amLODipine (NORVASC) tablet 10 mg  10 mg Oral QPM Wouk, Ailene Rud, MD   10 mg at 03/03/22 1032   aspirin EC tablet 81 mg  81 mg Oral Daily Adefeso, Oladapo, DO   81 mg at 03/03/22 1032   atorvastatin (LIPITOR) tablet 80 mg  80 mg Oral Daily Adefeso, Oladapo, DO   80 mg at 03/03/22 1032   bupivacaine(PF) (MARCAINE) 0.5 % injection 10 mL  10 mL Infiltration Once Lisette Abu, PA-C       heparin injection 5,000 Units  5,000 Units Subcutaneous Q8H  Adefeso, Oladapo, DO   5,000 Units at 03/03/22 0960   hydrALAZINE (APRESOLINE) tablet 100 mg  100 mg Oral BID Gwynne Edinger, MD       isosorbide mononitrate (IMDUR) 24 hr tablet 120 mg  120 mg Oral Daily Adefeso, Oladapo, DO   120 mg at 03/03/22 1032   methylPREDNISolone acetate (DEPO-MEDROL) injection 40 mg  40 mg Intra-articular Once Lisette Abu, PA-C       metoprolol tartrate (LOPRESSOR) tablet 25 mg  25 mg Oral BID Adefeso, Oladapo, DO   25 mg at 03/02/22 2112   ondansetron (ZOFRAN) tablet 4 mg  4 mg Oral Q6H PRN Adefeso, Oladapo, DO       Or   ondansetron (ZOFRAN) injection 4 mg  4 mg Intravenous Q6H PRN Adefeso, Oladapo, DO       sodium bicarbonate 150 mEq in dextrose 5 % 1,150 mL infusion   Intravenous Continuous Gwynne Edinger, MD 100 mL/hr at 03/03/22 1239 New Bag at 03/03/22 1239     Discharge Medications: Please see discharge summary for a list of discharge medications.  Relevant Imaging Results:  Relevant Lab Results:   Additional Information SSN: [id number]  Archie Endo, LCSW

## 2022-03-03 NOTE — Care Plan (Signed)
R knee aspiration cell count 1,750, positive for extracellular monosodium urate crystals.  No concern for septic arthritis at this time.  Recommendations: Rheumatology consultation PT/OT as appropriate No orthopaedic follow-up is necessary  Orthopaedics will sign off.  Please re-consult orthopaedics if needed.  Georgeanna Harrison M.D. Orthopaedic Surgery [facility]

## 2022-03-03 NOTE — Plan of Care (Signed)

## 2022-03-04 DIAGNOSIS — N179 Acute kidney failure, unspecified: Secondary | ICD-10-CM | POA: Diagnosis not present

## 2022-03-04 LAB — URIC ACID: Uric Acid, Serum: 9.5 mg/dL — ABNORMAL HIGH (ref 3.7–8.6)

## 2022-03-04 LAB — PARATHYROID HORMONE, INTACT (NO CA): PTH: 67 pg/mL — ABNORMAL HIGH (ref 15–65)

## 2022-03-04 LAB — BASIC METABOLIC PANEL
Anion gap: 6 (ref 5–15)
BUN: 51 mg/dL — ABNORMAL HIGH (ref 8–23)
CO2: 22 mmol/L (ref 22–32)
Calcium: 9.8 mg/dL (ref 8.9–10.3)
Chloride: 108 mmol/L (ref 98–111)
Creatinine, Ser: 2.34 mg/dL — ABNORMAL HIGH (ref 0.61–1.24)
GFR, Estimated: 26 mL/min — ABNORMAL LOW (ref >60)
Glucose, Bld: 127 mg/dL — ABNORMAL HIGH (ref 70–99)
Potassium: 5.2 mmol/L — ABNORMAL HIGH (ref 3.5–5.1)
Sodium: 136 mmol/L (ref 135–145)

## 2022-03-04 LAB — GLUCOSE, CAPILLARY
Glucose-Capillary: 127 mg/dL — ABNORMAL HIGH (ref 70–99)
Glucose-Capillary: 121 mg/dL — ABNORMAL HIGH (ref 70–99)

## 2022-03-04 MED ORDER — IBUPROFEN 200 MG PO TABS
200.0000 mg | ORAL_TABLET | Freq: Three times a day (TID) | ORAL | Status: AC
  Administered 2022-03-04 – 2022-03-05 (×4): 200 mg via ORAL
  Filled 2022-03-04 (×6): qty 1

## 2022-03-04 MED ORDER — COLCHICINE 0.6 MG PO TABS
0.6000 mg | ORAL_TABLET | Freq: Every day | ORAL | Status: DC
  Administered 2022-03-05 – 2022-03-06 (×2): 0.6 mg via ORAL
  Filled 2022-03-04 (×3): qty 1

## 2022-03-04 NOTE — Care Management Important Message (Signed)
Important Message  Patient Details  Name: [name] MRN: [id number] Date of Birth: [birth year]   Medicare Important Message Given:  Yes     Miller Limehouse Montine Circle 03/04/2022, 3:46 PM

## 2022-03-04 NOTE — Progress Notes (Signed)
PROGRESS NOTE    [name]  JME:[id number] DOB: 07/19/1930 DOA: 02/28/2022 PCP: Mack Hook, MD   Brief Narrative:  [name] is a 88 y.o. male with medical history significant of hypertension, CKD stage 4, CAD s/p stenting, stroke, hyperparathyroidism who presents to the emergency department due to 3-day onset of flulike symptoms and generalized weakness.    ED Course:  In the emergency department, patient was hemodynamically stable with vital signs being within normal range, BP was 128/56.  Workup in the ED showed normocytic anemia, BMP showed sodium 136, potassium 5.1, chloride 104, bicarb 19, blood glucose 134, BUN 87, creatinine 4.09 (baseline creatinine at 2.0-2.4), calcium 11.6.  Urinalysis was normal.  Influenza A, B, SARS coronavirus 2, RSV was negative.CT abdomen and pelvis without contrast showed no acute intra-abdominal finding.CT cervical spine without contrast showed no acute traumatic abnormality, but showed extensive degenerative disc disease.CT head without contrast showed no acute intracranial process identified Right knee x-ray showed no fracture or dislocation of the right knee. 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 Large knee joint effusion.Chest x-ray showed no active cardiopulmonary disease. IV hydration was provided and hospitalist was asked to admit patient for further evaluation and management.  Assessment & Plan:   Acute kidney injury superimposed on CKD stage IV, improving -BUN 87, creatinine 4.09 (baseline creatinine at 2.0-2.4). today down to 2.58 -Continue IV hydration -CT renal stone: Negative for any acute findings. -Renally adjust medications, avoid nephrotoxic agents/dehydration/hypotension  -kidney function-improving  Acute metabolic encephalopathy,  likely hospital delirium -Continue precautions - family educated on sleep cycle issues with patient sleeping all day -Oriented to person only - not at baseline per family at bedside  Hyperkalemia, improving K 5.2 today, likely 2/2 kidney dysfunction, bicarb improving - Continue IV bicarb fluids  Generalized weakness - possibly due to above, difficulty following commands today - confused; oriented to person only -Continue fall precaution -PT recommend HH PT  Right knee pain with effusion - acute gout flare, no known history -Uric acid positive on both lab draw and from knee  Hypercalcemia: Calcium 11.6 -Resolved -PTH pending   Pulmonary nodule -CT abdomen pelvis without contrast showed groundglass  nodules measuring up to 17 mm in the left lower lobe, persistent based on prior imaging. Adenocarcinoma cannot be excluded. Follow up by CT is recommended in 12 months   CAD s/p stenting -Denies ACS symptoms.  Continue aspirin, Lipitor, Lopressor, Imdur   History of CVA -Continue aspirin, Lipitor   Essential hypertension Continue home amlodipine and hydralazine.  Losartan on hold 2/2 aki - likely to discontinue at discharge   Mixed hyperlipidemia -Continue Lipitor  Normocytic anemia: -Baseline hemoglobin in 9-10 range.  DVT prophylaxis: Heparin Code Status: Full code Family Communication: updated @ bedside 12/27 Disposition Plan: To be determined - likely home with HHPT pending mental status improvement  Consultants:  None  Procedures:  None   Antimicrobials:  None  Status is: Inpatient   Subjective: Patient seen and examined.  Resting comfortably on the bed, somnolent but easily arousable - no complaints/issues per patient.  Objective: Vitals:   03/03/22 0726 03/03/22 1408 03/03/22 1538 03/03/22 2038  BP: (!) 168/60 (!) 158/76 (!) 122/53 (!) 182/72  Pulse: (!) 53  (!) 56 87  Resp: '18  18 17  '$ Temp: 97.6 F (36.4 C)  97.8 F (36.6 C)  97.7 F (36.5 C)   TempSrc: Oral  Oral Oral  SpO2: 100%  100% 100%  Weight:      Height:        Intake/Output Summary (Last 24 hours) at 03/04/2022 8563 Last data filed at 03/03/2022 2132 Gross per 24 hour  Intake 417 ml  Output 300 ml  Net 117 ml    Filed Weights   03/01/22 1019  Weight: 61.2 kg    Examination:  General exam: Appears calm and comfortable, on room air, oriented to person only Respiratory system: Clear to auscultation. Respiratory effort normal. Cardiovascular system: S1 & S2 heard, RRR. No JVD, murmurs, rubs, gallops or clicks. No pedal edema. Gastrointestinal system: Abdomen is nondistended, soft and nontender. No organomegaly or masses felt. Normal bowel sounds heard. Central nervous system: Alert and oriented. No focal neurological deficits. Extremities: Symmetric 5 x 5 power. Right upper arm: Soft swelling noted on anterior aspect.  Likely lipoma.   Data Reviewed: I have personally reviewed following labs and imaging studies  CBC: Recent Labs  Lab 02/28/22 2350 03/01/22 0810 03/02/22 0217  WBC 6.9 6.4 4.7  HGB 11.7* 10.8* 8.4*  HCT 36.4* 34.2* 25.8*  MCV 87.7 87.9 86.3  PLT 186 138* 149    Basic Metabolic Panel: Recent Labs  Lab 02/28/22 2350 03/01/22 0810 03/02/22 0217 03/03/22 0300 03/03/22 1427  NA 136  --  134* 137 137  K 5.1  --  5.1 5.6* 4.9  CL 104  --  108 114* 111  CO2 19*  --  20* 15* 21*  GLUCOSE 134*  --  96 84 109*  BUN 87*  --  87* 70* 63*  CREATININE 4.09* 3.52* 3.25* 2.58* 2.50*  CALCIUM 11.6*  --  9.7 9.5 9.6  MG  --  1.9  --   --   --   PHOS  --  4.9*  --   --   --     GFR: Estimated Creatinine Clearance: 17.3 mL/min (A) (by C-G formula based on SCr of 2.5 mg/dL (H)). Liver Function Tests: Recent Labs  Lab 03/02/22 0217  AST 32  ALT 26  ALKPHOS 56  BILITOT 0.4  PROT 6.3*  ALBUMIN 2.2*    Recent Results (from the past 240 hour(s))  Resp panel by RT-PCR (RSV, Flu A&B, Covid) Anterior Nasal Swab     Status: None    Collection Time: 02/28/22 12:43 PM   Specimen: Anterior Nasal Swab  Result Value Ref Range Status   SARS Coronavirus 2 by RT PCR NEGATIVE NEGATIVE Final    Comment: (NOTE) SARS-CoV-2 target nucleic acids are NOT DETECTED.  The SARS-CoV-2 RNA is generally detectable in upper respiratory specimens during the acute phase of infection. The lowest concentration of SARS-CoV-2 viral copies this assay can detect is 138 copies/mL. A negative result does not preclude SARS-Cov-2 infection and should not be used as the sole basis for treatment or other patient management decisions. A negative result may occur with  improper specimen collection/handling, submission of specimen other than nasopharyngeal swab, presence of viral mutation(s) within the areas targeted by this assay, and inadequate number of viral copies(<138 copies/mL). A negative result must be combined with clinical observations, patient history, and epidemiological information. The expected result is Negative.  Fact Sheet for Patients:  EntrepreneurPulse.com.au  Fact Sheet for Healthcare Providers:  IncredibleEmployment.be  This test is no t yet approved or cleared by the [place] FDA and  has been authorized for detection and/or diagnosis of  SARS-CoV-2 by FDA under an Emergency Use Authorization (EUA). This EUA will remain  in effect (meaning this test can be used) for the duration of the COVID-19 declaration under Section 564(b)(1) of the Act, 21 U.S.C.section 360bbb-3(b)(1), unless the authorization is terminated  or revoked sooner.       Influenza A by PCR NEGATIVE NEGATIVE Final   Influenza B by PCR NEGATIVE NEGATIVE Final    Comment: (NOTE) The Xpert Xpress SARS-CoV-2/FLU/RSV plus assay is intended as an aid in the diagnosis of influenza from Nasopharyngeal swab specimens and should not be used as a sole basis for treatment. Nasal washings and aspirates are unacceptable for  Xpert Xpress SARS-CoV-2/FLU/RSV testing.  Fact Sheet for Patients: EntrepreneurPulse.com.au  Fact Sheet for Healthcare Providers: IncredibleEmployment.be  This test is not yet approved or cleared by the [place] FDA and has been authorized for detection and/or diagnosis of SARS-CoV-2 by FDA under an Emergency Use Authorization (EUA). This EUA will remain in effect (meaning this test can be used) for the duration of the COVID-19 declaration under Section 564(b)(1) of the Act, 21 U.S.C. section 360bbb-3(b)(1), unless the authorization is terminated or revoked.     Resp Syncytial Virus by PCR NEGATIVE NEGATIVE Final    Comment: (NOTE) Fact Sheet for Patients: EntrepreneurPulse.com.au  Fact Sheet for Healthcare Providers: IncredibleEmployment.be  This test is not yet approved or cleared by the [place] FDA and has been authorized for detection and/or diagnosis of SARS-CoV-2 by FDA under an Emergency Use Authorization (EUA). This EUA will remain in effect (meaning this test can be used) for the duration of the COVID-19 declaration under Section 564(b)(1) of the Act, 21 U.S.C. section 360bbb-3(b)(1), unless the authorization is terminated or revoked.  Performed at [facility], [place] [address] [place], [place] [address]   Body fluid culture w Gram Stain     Status: None (Preliminary result)   Collection Time: 03/03/22  4:29 PM   Specimen: Synovium; Body Fluid  Result Value Ref Range Status   Specimen Description SYNOVIAL  Final   Special Requests RIGHT KNEE  Final   Gram Stain   Final    RARE WBC PRESENT, PREDOMINANTLY PMN NO ORGANISMS SEEN Performed at [facility], [address], [place],  [address]    Culture PENDING  Incomplete   Report Status PENDING  Incomplete      Radiology Studies: No results found.  Scheduled Meds:  amLODipine  10 mg Oral QPM   aspirin EC   81 mg Oral Daily   atorvastatin  80 mg Oral Daily   heparin  5,000 Units Subcutaneous Q8H   hydrALAZINE  100 mg Oral BID   isosorbide mononitrate  120 mg Oral Daily   metoprolol tartrate  25 mg Oral BID   Continuous Infusions:  sodium bicarbonate 150 mEq in dextrose 5 % 1,150 mL infusion 50 mL/hr at 03/03/22 1722     LOS: 3 days   Time spent: 30 minutes   Little Ishikawa, DO [facility]  If 7PM-7AM, please contact night-coverage [email or web address] 03/04/2022, 7:33 AM

## 2022-03-04 NOTE — TOC Progression Note (Addendum)
Transition of Care [facility]) - Progression Note    Patient Details  Name: [name] MRN: [id number] Date of Birth: Apr 09, 1930  Transition of Care [facility]) CM/SW [place], RN Phone Number: 03/04/2022, 1:09 PM  Clinical Narrative:    CM met with the patient at the bedside and he was unable to stay awake and answer my questions to be able to make an informed decision regarding SNF placement or choice.  I called and spoke with the patient's daughter [name] - [phone] and she provided me with the correct number for the patient's granddaughter, [name] and it was corrected in EPIC.  The patient's granddaughter will be provided with Medicare choice for SNF placement and insurance authorization will be started for SNF placement for the patient.  CM will continue to follow the patient for SNF choice and placement once insurance approves.  03/04/2022 1349 - CM called and spoke with the patient's granddaughter, [name] and she asked that I call and speak with [name], family member to give Medicare choice since the patient's is unable to choose a facility.  [name] chose [facility] SN[facility].  I will start insurance authorization for the facility today for potential discharge to the facility tomorrow.  The patient's daughter states that the patient lives alone after his wife died recently and has PCS Services at the home through [facility].  03/04/2022 1431-  I called and left a message with [name], Admissions at [facility] since I was unable to reach her by phone.  I started insurance authorization with [place] pending at this time for placement at [facility].   Expected Discharge Plan: [place] Barriers to Discharge: Continued Medical Work up  Expected Discharge Plan and Services In-house Referral: Clinical Social Work Discharge Planning Services: CM Consult Post Acute Care Choice: [place] Living  arrangements for the past 2 months: Apartment                                       Social Determinants of Health (SDOH) Interventions SDOH Screenings   Food Insecurity: No Food Insecurity (05/11/2021)  Housing: Low Risk  (05/11/2021)  Transportation Needs: Unknown (05/11/2021)  Tobacco Use: High Risk (02/28/2022)    Readmission Risk Interventions     No data to display

## 2022-03-05 DIAGNOSIS — N179 Acute kidney failure, unspecified: Secondary | ICD-10-CM | POA: Diagnosis not present

## 2022-03-05 LAB — BASIC METABOLIC PANEL
Anion gap: 4 — ABNORMAL LOW (ref 5–15)
BUN: 48 mg/dL — ABNORMAL HIGH (ref 8–23)
CO2: 22 mmol/L (ref 22–32)
Calcium: 9.9 mg/dL (ref 8.9–10.3)
Chloride: 110 mmol/L (ref 98–111)
Creatinine, Ser: 2.34 mg/dL — ABNORMAL HIGH (ref 0.61–1.24)
GFR, Estimated: 26 mL/min — ABNORMAL LOW (ref >60)
Glucose, Bld: 113 mg/dL — ABNORMAL HIGH (ref 70–99)
Potassium: 4.8 mmol/L (ref 3.5–5.1)
Sodium: 136 mmol/L (ref 135–145)

## 2022-03-05 LAB — GLUCOSE, CAPILLARY: Glucose-Capillary: 107 mg/dL — ABNORMAL HIGH (ref 70–99)

## 2022-03-05 NOTE — Progress Notes (Signed)
PROGRESS NOTE    [name]  BWI:[id number] DOB: 12/09/1930 DOA: 02/28/2022 PCP: Mack Hook, MD   Brief Narrative:  [name] is a 88 y.o. male with medical history significant of hypertension, CKD stage 4, CAD s/p stenting, stroke, hyperparathyroidism who presents to the emergency department due to 3-day onset of flulike symptoms and generalized weakness.    ED Course:  In the emergency department, patient was hemodynamically stable with vital signs being within normal range, BP was 128/56.  Workup in the ED showed normocytic anemia, BMP showed sodium 136, potassium 5.1, chloride 104, bicarb 19, blood glucose 134, BUN 87, creatinine 4.09 (baseline creatinine at 2.0-2.4), calcium 11.6.  Urinalysis was normal.  Influenza A, B, SARS coronavirus 2, RSV was negative.CT abdomen and pelvis without contrast showed no acute intra-abdominal finding.CT cervical spine without contrast showed no acute traumatic abnormality, but showed extensive degenerative disc disease.CT head without contrast showed no acute intracranial process identified Right knee x-ray showed no fracture or dislocation of the right knee. 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 Large knee joint effusion. Chest x-ray showed no active cardiopulmonary disease. IV hydration was provided and hospitalist was asked to admit patient for further evaluation and management.  Assessment & Plan:   Acute kidney injury superimposed on CKD stage IV, improving -BUN 87, creatinine 4.09 (baseline creatinine at 2.0-2.4). today down to 2.3 -Continue IV hydration -CT renal stone: Negative for any acute findings. -Renally adjust medications, avoid nephrotoxic agents/dehydration/hypotension  Acute metabolic encephalopathy, likely hospital delirium,  resolving -Continue precautions - family educated on sleep cycle issues with patient sleeping all day -Oriented to person place and situation today, likely back to baseline  Hyperkalemia, resolving Potassium downtrending, discontinue IV fluids, increase p.o. intake  Generalized weakness -Complicated by encephalopathy and gout flare, PT OT currently recommending SNF-reevaluation pending given improved mental status, patient hopeful to discharge home but may not be safe due to his lack of support at home currently living alone.  Right knee pain with effusion - acute gout flare, no known history -Uric acid positive on both blood draw and from knee -Colchicine x 3 doses given CKD, cannot repeat doses for 14 days -Knee pain markedly improving  Hypercalcemia: Resolved -PTH elevated mildly - follow w/ PCP for further testing in the next few weeks to ensure not acute phase reactant given above   Pulmonary nodule, incidentally noted -CT abdomen pelvis without contrast showed groundglass  nodules measuring up to 17 mm in the left lower lobe, persistent based on prior imaging. Adenocarcinoma cannot be excluded. Follow up by CT is recommended in 12 months   CAD s/p stenting -Denies ACS symptoms.  Continue aspirin, Lipitor, Lopressor, Imdur   History of CVA -Continue aspirin, Lipitor   Essential hypertension Continue home amlodipine and hydralazine.  Losartan on hold 2/2 aki - likely to discontinue at discharge   Mixed hyperlipidemia -Continue Lipitor  Normocytic anemia: -Baseline hemoglobin in 9-10 range.  DVT prophylaxis: Heparin Code Status: Full code Family Communication: updated granddaughter over the phone Disposition Plan: To be determined - likely SNF pending further evaluation with PT OT  Consultants:  None  Procedures:  None   Antimicrobials:  None  Status is: Inpatient   Subjective: No acute issues or events overnight, patient much more awake alert oriented today  and likely back to baseline denies nausea vomiting  diarrhea constipation headache fevers chills or chest pain.  Reports knee pain is drastically improved if not "back to normal"  Objective: Vitals:   03/04/22 1542 03/04/22 2101 03/04/22 2152 03/05/22 0452  BP: (!) 156/60 (!) 150/60  (!) 151/77  Pulse: (!) 49 (!) 47 (!) 47 (!) 49  Resp: '18 17  17  '$ Temp: 97.7 F (36.5 C) 97.8 F (36.6 C)  98.5 F (36.9 C)  TempSrc: Oral Oral    SpO2: 100% 100%  100%  Weight:      Height:        Intake/Output Summary (Last 24 hours) at 03/05/2022 0732 Last data filed at 03/05/2022 0354 Gross per 24 hour  Intake 2438.34 ml  Output 300 ml  Net 2138.34 ml    Filed Weights   03/01/22 1019  Weight: 61.2 kg    Examination:  General:  Pleasantly resting in bed, No acute distress. HEENT:  Normocephalic atraumatic.  Sclerae nonicteric, noninjected.  Extraocular movements intact bilaterally. Neck:  Without mass or deformity.  Trachea is midline. Lungs:  Clear to auscultate bilaterally without rhonchi, wheeze, or rales. Heart:  Regular rate and rhythm.  Without murmurs, rubs, or gallops. Abdomen:  Soft, nontender, nondistended.  Without guarding or rebound. Extremities: Without cyanosis, clubbing, edema, right knee minimally tender without decreased range of motion Vascular:  Dorsalis pedis and posterior tibial pulses palpable bilaterally. Skin:  Warm and dry, no erythema  Data Reviewed: I have personally reviewed following labs and imaging studies  CBC: Recent Labs  Lab 02/28/22 2350 03/01/22 0810 03/02/22 0217  WBC 6.9 6.4 4.7  HGB 11.7* 10.8* 8.4*  HCT 36.4* 34.2* 25.8*  MCV 87.7 87.9 86.3  PLT 186 138* 709    Basic Metabolic Panel: Recent Labs  Lab 03/01/22 0810 03/02/22 0217 03/03/22 0300 03/03/22 1427 03/04/22 0645 03/05/22 0044  NA  --  134* 137 137 136 136  K  --  5.1 5.6* 4.9 5.2* 4.8  CL  --  108 114* 111 108 110  CO2  --  20* 15* 21* 22 22  GLUCOSE  --  96 84 109*  127* 113*  BUN  --  87* 70* 63* 51* 48*  CREATININE 3.52* 3.25* 2.58* 2.50* 2.34* 2.34*  CALCIUM  --  9.7 9.5 9.6 9.8 9.9  MG 1.9  --   --   --   --   --   PHOS 4.9*  --   --   --   --   --     GFR: Estimated Creatinine Clearance: 18.5 mL/min (A) (by C-G formula based on SCr of 2.34 mg/dL (H)). Liver Function Tests: Recent Labs  Lab 03/02/22 0217  AST 32  ALT 26  ALKPHOS 56  BILITOT 0.4  PROT 6.3*  ALBUMIN 2.2*    Recent Results (from the past 240 hour(s))  Resp panel by RT-PCR (RSV, Flu A&B, Covid) Anterior Nasal Swab     Status: None   Collection Time: 02/28/22 12:43 PM   Specimen: Anterior Nasal Swab  Result Value Ref Range Status   SARS Coronavirus 2 by RT PCR NEGATIVE NEGATIVE Final    Comment: (NOTE) SARS-CoV-2 target nucleic acids are NOT DETECTED.  The SARS-CoV-2 RNA is generally detectable in upper respiratory specimens during the acute phase of infection. The lowest concentration of SARS-CoV-2 viral copies this assay can detect is 138 copies/mL. A negative result does not preclude SARS-Cov-2 infection and should not be used as the sole basis for treatment or other patient  management decisions. A negative result may occur with  improper specimen collection/handling, submission of specimen other than nasopharyngeal swab, presence of viral mutation(s) within the areas targeted by this assay, and inadequate number of viral copies(<138 copies/mL). A negative result must be combined with clinical observations, patient history, and epidemiological information. The expected result is Negative.  Fact Sheet for Patients:  EntrepreneurPulse.com.au  Fact Sheet for Healthcare Providers:  IncredibleEmployment.be  This test is no t yet approved or cleared by the [place] FDA and  has been authorized for detection and/or diagnosis of SARS-CoV-2 by FDA under an Emergency Use Authorization (EUA). This EUA will remain  in effect  (meaning this test can be used) for the duration of the COVID-19 declaration under Section 564(b)(1) of the Act, 21 U.S.C.section 360bbb-3(b)(1), unless the authorization is terminated  or revoked sooner.       Influenza A by PCR NEGATIVE NEGATIVE Final   Influenza B by PCR NEGATIVE NEGATIVE Final    Comment: (NOTE) The Xpert Xpress SARS-CoV-2/FLU/RSV plus assay is intended as an aid in the diagnosis of influenza from Nasopharyngeal swab specimens and should not be used as a sole basis for treatment. Nasal washings and aspirates are unacceptable for Xpert Xpress SARS-CoV-2/FLU/RSV testing.  Fact Sheet for Patients: EntrepreneurPulse.com.au  Fact Sheet for Healthcare Providers: IncredibleEmployment.be  This test is not yet approved or cleared by the [place] FDA and has been authorized for detection and/or diagnosis of SARS-CoV-2 by FDA under an Emergency Use Authorization (EUA). This EUA will remain in effect (meaning this test can be used) for the duration of the COVID-19 declaration under Section 564(b)(1) of the Act, 21 U.S.C. section 360bbb-3(b)(1), unless the authorization is terminated or revoked.     Resp Syncytial Virus by PCR NEGATIVE NEGATIVE Final    Comment: (NOTE) Fact Sheet for Patients: EntrepreneurPulse.com.au  Fact Sheet for Healthcare Providers: IncredibleEmployment.be  This test is not yet approved or cleared by the [place] FDA and has been authorized for detection and/or diagnosis of SARS-CoV-2 by FDA under an Emergency Use Authorization (EUA). This EUA will remain in effect (meaning this test can be used) for the duration of the COVID-19 declaration under Section 564(b)(1) of the Act, 21 U.S.C. section 360bbb-3(b)(1), unless the authorization is terminated or revoked.  Performed at [facility], [place] [address] [place], [place] [address]   Body fluid culture w  Gram Stain     Status: None (Preliminary result)   Collection Time: 03/03/22  4:29 PM   Specimen: Synovium; Body Fluid  Result Value Ref Range Status   Specimen Description SYNOVIAL  Final   Special Requests RIGHT KNEE  Final   Gram Stain   Final    RARE WBC PRESENT, PREDOMINANTLY PMN NO ORGANISMS SEEN    Culture   Final    NO GROWTH < 24 HOURS Performed at [facility] [address], [place], [place] [address]    Report Status PENDING  Incomplete      Radiology Studies: No results found.  Scheduled Meds:  amLODipine  10 mg Oral QPM   aspirin EC  81 mg Oral Daily   atorvastatin  80 mg Oral Daily   colchicine  0.6 mg Oral Daily   heparin  5,000 Units Subcutaneous Q8H   hydrALAZINE  100 mg Oral BID   ibuprofen  200 mg Oral TID   isosorbide mononitrate  120 mg Oral Daily   metoprolol tartrate  25 mg Oral BID  Continuous Infusions:  sodium bicarbonate 150 mEq in dextrose 5 % 1,150 mL infusion 50 mL/hr at 03/04/22 2352     LOS: 4 days   Time spent: 30 minutes   Little Ishikawa, DO [facility]  If 7PM-7AM, please contact night-coverage [email or web address] 03/05/2022, 7:32 AM

## 2022-03-05 NOTE — Progress Notes (Signed)
Physical Therapy Treatment Patient Details Name: [name] MRN: [id number] DOB: 01-Apr-1930 Today's Date: 03/05/2022   History of Present Illness [name] is a 88 y.o. male with medical history significant of HTN, CKD stage 4, CAD s/p stenting, stroke, hyperparathyroidism who presents to the ED due to 3-day onset of flulike symptoms and generalized weakness and fall at home with R knee pain. Found to have right knee effusion.    PT Comments    Pt continues to require light assist for bed mobility, transfers, and gait at this time. Per chart review, pt does not have 24/7 assist at home but per pt he does. Pt would need assist for all mobility at this point if he were to d/c home. PT to continue to follow.     Recommendations for follow up therapy are one component of a multi-disciplinary discharge planning process, led by the attending physician.  Recommendations may be updated based on patient status, additional functional criteria and insurance authorization.  Follow Up Recommendations  Skilled nursing-short term rehab (<3 hours/day)     Assistance Recommended at Discharge Frequent or constant Supervision/Assistance  Patient can return home with the following A little help with walking and/or transfers;A lot of help with bathing/dressing/bathroom;Assist for transportation;Assistance with cooking/housework;Help with stairs or ramp for entrance;Assistance with feeding   Equipment Recommendations  None recommended by PT    Recommendations for Other Services       Precautions / Restrictions Precautions Precautions: Fall Restrictions Weight Bearing Restrictions: No     Mobility  Bed Mobility Overal bed mobility: Needs Assistance Bed Mobility: Supine to Sit, Sit to Supine     Supine to sit: Min assist     General bed mobility comments: assist for trunk rise, use of HOB elevation and bedrails to perform    Transfers Overall transfer level: Needs assistance Equipment  used: Rolling walker (2 wheels) Transfers: Sit to/from Stand Sit to Stand: Min assist           General transfer comment: rise and steady assist, cues for correct hand placement when rising/sitting    Ambulation/Gait Ambulation/Gait assistance: Min assist Gait Distance (Feet): 90 Feet Assistive device: Rolling walker (2 wheels) Gait Pattern/deviations: Step-through pattern, Ataxic, Decreased stride length, Wide base of support, Trunk flexed Gait velocity: decr     General Gait Details: assist to steady, cues for placement in RW, upright posture. Pt reporting UE fatigue, again cued to be more inside RW to prevent this   Stairs             Wheelchair Mobility    Modified Rankin (Stroke Patients Only)       Balance Overall balance assessment: Needs assistance Sitting-balance support: Feet supported, No upper extremity supported Sitting balance-Leahy Scale: Good     Standing balance support: During functional activity, Bilateral upper extremity supported Standing balance-Leahy Scale: Poor Standing balance comment: Requires UE support on Rw                            Cognition Arousal/Alertness: Awake/alert Behavior During Therapy: WFL for tasks assessed/performed Overall Cognitive Status: No family/caregiver present to determine baseline cognitive functioning                                 General Comments: oriented to self, tangential in conversation        Exercises      General  Comments        Pertinent Vitals/Pain Pain Assessment Pain Assessment: Faces Faces Pain Scale: Hurts a little bit Pain Location: R knee with mobility Pain Descriptors / Indicators: Discomfort, Aching, Sore, Guarding, Grimacing Pain Intervention(s): Limited activity within patient's tolerance, Monitored during session, Repositioned    Home Living                          Prior Function            PT Goals (current goals can now  be found in the care plan section) Acute Rehab PT Goals Patient Stated Goal: to return home PT Goal Formulation: With patient Time For Goal Achievement: 03/15/22 Potential to Achieve Goals: Fair Progress towards PT goals: Progressing toward goals    Frequency    Min 2X/week      PT Plan Current plan remains appropriate    Co-evaluation              AM-PAC PT "6 Clicks" Mobility   Outcome Measure  Help needed turning from your back to your side while in a flat bed without using bedrails?: A Little Help needed moving from lying on your back to sitting on the side of a flat bed without using bedrails?: A Little Help needed moving to and from a bed to a chair (including a wheelchair)?: A Little Help needed standing up from a chair using your arms (e.g., wheelchair or bedside chair)?: A Little Help needed to walk in hospital room?: A Little Help needed climbing 3-5 steps with a railing? : A Lot 6 Click Score: 17    End of Session   Activity Tolerance: Patient tolerated treatment well Patient left: in chair;with chair alarm set;with call bell/phone within reach Nurse Communication: Mobility status PT Visit Diagnosis: Muscle weakness (generalized) (M62.81);History of falling (Z91.81);Other symptoms and signs involving the nervous system (R29.898);Pain Pain - Right/Left: Right Pain - part of body: Knee     Time: 1221-1238 PT Time Calculation (min) (ACUTE ONLY): 17 min  Charges:  $Gait Training: 8-22 mins                    Stacie Glaze, PT DPT Acute Rehabilitation Services Pager [phone]  Office [phone]    [place] 03/05/2022, 4:36 PM

## 2022-03-06 ENCOUNTER — Other Ambulatory Visit (HOSPITAL_COMMUNITY): Payer: Self-pay

## 2022-03-06 DIAGNOSIS — N179 Acute kidney failure, unspecified: Secondary | ICD-10-CM | POA: Diagnosis not present

## 2022-03-06 LAB — BASIC METABOLIC PANEL
Anion gap: 10 (ref 5–15)
BUN: 48 mg/dL — ABNORMAL HIGH (ref 8–23)
CO2: 22 mmol/L (ref 22–32)
Calcium: 9.4 mg/dL (ref 8.9–10.3)
Chloride: 106 mmol/L (ref 98–111)
Creatinine, Ser: 2.44 mg/dL — ABNORMAL HIGH (ref 0.61–1.24)
GFR, Estimated: 25 mL/min — ABNORMAL LOW (ref >60)
Glucose, Bld: 87 mg/dL (ref 70–99)
Potassium: 5.1 mmol/L (ref 3.5–5.1)
Sodium: 138 mmol/L (ref 135–145)

## 2022-03-06 MED ORDER — ASPIRIN 81 MG PO TBEC
81.0000 mg | DELAYED_RELEASE_TABLET | Freq: Every day | ORAL | 12 refills | Status: DC
  Filled 2022-03-06: qty 30, 30d supply, fill #0

## 2022-03-06 NOTE — TOC Transition Note (Signed)
Transition of Care [facility]) - CM/SW Discharge Note   Patient Details  Name: [name] MRN: [id number] Date of Birth: 1930-08-19  Transition [facility] Care [facility]) CM/SW Contact:  Curlene Labrum, RN Phone Number: 03/06/2022, 9:52 AM   Clinical Narrative:    CM called and spoke with Perrin Smack, CM with [facility] and the facility will have an available bed for admission today at 12 noon.  Kitty, [place] with facility was provided with the patient's family number to contact for admission paperwork since the patient is confused and unable to provide.  PTAR was called and scheduled for 12 noon today for transport to the facility.  PTAR packet includes medical necessity, facesheet, discharge summary.  Discharge summary and orders were uploaded in the hub for the facility.  Bedside nursing - please call nursing report to [facility] at [phone].   Final next level of care: Skilled Nursing Facility Barriers to Discharge: Continued Medical Work up   Patient Goals and CMS Choice CMS Medicare.gov Compare Post Acute Care list provided to:: Patient Choice offered to / list presented to : Patient  Discharge Placement                         Discharge Plan and Services Additional resources added to the After Visit Summary for   In-house Referral: Clinical Social Work Discharge Planning Services: CM Consult Post Acute Care Choice: [place]                               Social Determinants of Health (SDOH) Interventions SDOH Screenings   Food Insecurity: No Food Insecurity (05/11/2021)  Housing: Low Risk  (05/11/2021)  Transportation Needs: Unknown (05/11/2021)  Tobacco Use: High Risk (02/28/2022)     Readmission Risk Interventions     No data to display

## 2022-03-06 NOTE — Progress Notes (Signed)
Occupational Therapy Treatment Patient Details Name: [name] MRN: [id number] DOB: 09/17/1930 Today's Date: 03/06/2022   History of present illness [name] is a 88 y.o. male with medical history significant of HTN, CKD stage 4, CAD s/p stenting, stroke, hyperparathyroidism who presents to the ED due to 3-day onset of flulike symptoms and generalized weakness and fall at home with R knee pain. Found to have right knee effusion.   OT comments  Patient received in bed and had already performed UB dressing. Patient demonstrated gains with LB dressing while seated on EOB with min assist from mod assist with assistance needed to thread legs through clothing and min guard while standing to pull up clothing. Patient oriented to self but was able to follow commands with frequent cues for safety. Discharge recommendations for SNF continues to be appropriate for continued OT to address dressing and toilet transfers.    Recommendations for follow up therapy are one component of a multi-disciplinary discharge planning process, led by the attending physician.  Recommendations may be updated based on patient status, additional functional criteria and insurance authorization.    Follow Up Recommendations  Skilled nursing-short term rehab (<3 hours/day)     Assistance Recommended at Discharge Frequent or constant Supervision/Assistance  Patient can return home with the following  A little help with walking and/or transfers;A lot of help with bathing/dressing/bathroom;Assistance with cooking/housework;Direct supervision/assist for medications management;Direct supervision/assist for financial management;Assist for transportation;Help with stairs or ramp for entrance   Equipment Recommendations  BSC/3in1    Recommendations for Other Services      Precautions / Restrictions Precautions Precautions: Fall Precaution Comments: ataxic Restrictions Weight Bearing Restrictions: No       Mobility  Bed Mobility Overal bed mobility: Needs Assistance Bed Mobility: Supine to Sit, Sit to Supine     Supine to sit: Min assist Sit to supine: Min guard   General bed mobility comments: assistance with trunk to get to EOB    Transfers Overall transfer level: Needs assistance Equipment used: Rolling walker (2 wheels) Transfers: Sit to/from Stand Sit to Stand: Min assist           General transfer comment: cues for hand placement and safety     Balance Overall balance assessment: Needs assistance Sitting-balance support: Feet supported, No upper extremity supported Sitting balance-Leahy Scale: Good     Standing balance support: During functional activity, Bilateral upper extremity supported, Single extremity supported Standing balance-Leahy Scale: Poor Standing balance comment: Requires UE support on RW. Able to use one extremity to assist with pulling up pants                           ADL either performed or assessed with clinical judgement   ADL Overall ADL's : Needs assistance/impaired                     Lower Body Dressing: Minimal assistance Lower Body Dressing Details (indicate cue type and reason): min assist to donn pants and shoes with min guard while standing Toilet Transfer: Minimal assistance;Ambulation;Regular Glass blower/designer Details (indicate cue type and reason): simulated           General ADL Comments: demonstrated gain in LB dressing    Extremity/Trunk Assessment              Vision       Perception     Praxis      Cognition Arousal/Alertness: Awake/alert Behavior During  Therapy: WFL for tasks assessed/performed Overall Cognitive Status: No family/caregiver present to determine baseline cognitive functioning                                 General Comments: oriented to self, tangential in conversation        Exercises      Shoulder Instructions       General Comments       Pertinent Vitals/ Pain       Pain Assessment Pain Assessment: Faces Faces Pain Scale: Hurts a little bit Pain Location: R knee with mobility Pain Descriptors / Indicators: Discomfort, Aching, Sore, Guarding, Grimacing Pain Intervention(s): Limited activity within patient's tolerance, Monitored during session, Repositioned  Home Living                                          Prior Functioning/Environment              Frequency  Min 2X/week        Progress Toward Goals  OT Goals(current goals can now be found in the care plan section)  Progress towards OT goals: Progressing toward goals  Acute Rehab OT Goals Patient Stated Goal: get better OT Goal Formulation: With patient Time For Goal Achievement: 03/17/22 Potential to Achieve Goals: Good ADL Goals Pt Will Perform Upper Body Dressing: with min guard assist;sitting Pt Will Perform Lower Body Dressing: with min guard assist;sit to/from stand Pt Will Transfer to Toilet: with min guard assist;ambulating;regular height toilet  Plan Discharge plan remains appropriate    Co-evaluation                 AM-PAC OT "6 Clicks" Daily Activity     Outcome Measure   Help from another person eating meals?: A Little Help from another person taking care of personal grooming?: A Little Help from another person toileting, which includes using toliet, bedpan, or urinal?: A Little Help from another person bathing (including washing, rinsing, drying)?: A Little Help from another person to put on and taking off regular upper body clothing?: A Little Help from another person to put on and taking off regular lower body clothing?: A Little 6 Click Score: 18    End of Session Equipment Utilized During Treatment: Gait belt;Rolling walker (2 wheels)  OT Visit Diagnosis: Unsteadiness on feet (R26.81);Other abnormalities of gait and mobility (R26.89);Muscle weakness (generalized) (M62.81)   Activity Tolerance  Patient tolerated treatment well   Patient Left in bed;with call bell/phone within reach;with bed alarm set   Nurse Communication Mobility status        Time: 3295-1884 OT Time Calculation (min): 21 min  Charges: OT General Charges $OT Visit: 1 Visit OT Treatments $Self Care/Home Management : 8-22 mins  Lodema Hong, [place]  Office [place] 03/06/2022, 11:38 AM

## 2022-03-06 NOTE — Plan of Care (Signed)
  Problem: Activity: Goal: Risk for activity intolerance will decrease Outcome: Progressing   Problem: Nutrition: Goal: Adequate nutrition will be maintained Outcome: Progressing   Problem: Safety: Goal: Ability to remain free from injury will improve Outcome: Progressing   Problem: Skin Integrity: Goal: Risk for impaired skin integrity will decrease Outcome: Progressing   

## 2022-03-06 NOTE — TOC Progression Note (Signed)
Transition of Care [facility]) - Progression Note    Patient Details  Name: [name] MRN: [id number] Date of Birth: 1931/01/11  Transition of Care [facility]) CM/SW [place], RN Phone Number: 03/06/2022, 10:00 AM  Clinical Narrative:    CM checked with [facility] portal this morning and Insurance is still pending for placement at [facility] in [place].  Insurance authorization was started yesterday, clinicals were uploaded in the hub and CM at [facility] is aware and following - [phone].   Expected Discharge Plan: [place] Barriers to Discharge: Continued Medical Work up  Expected Discharge Plan and Services In-house Referral: Clinical Social Work Discharge Planning Services: CM Consult Post Acute Care Choice: [place] Living arrangements for the past 2 months: Apartment Expected Discharge Date: 03/06/22                                     Social Determinants of Health (SDOH) Interventions SDOH Screenings   Food Insecurity: No Food Insecurity (05/11/2021)  Housing: Low Risk  (05/11/2021)  Transportation Needs: Unknown (05/11/2021)  Tobacco Use: High Risk (02/28/2022)    Readmission Risk Interventions     No data to display

## 2022-03-06 NOTE — Discharge Summary (Signed)
Physician Discharge Summary  [name] NFA:[id number] DOB: 1930/03/28 DOA: 02/28/2022  PCP: Mack Hook, MD  Admit date: 02/28/2022 Discharge date: 03/06/2022  Admitted From: Home Disposition:  SNF  Recommendations for Outpatient Follow-up:  Follow up with PCP in 1-2 weeks Please obtain BMP/CBC in one week  Discharge Condition:Stable  CODE STATUS:Full  Diet recommendation:  Low salt low fat diet - follow gout recommendations as discussed  Brief/Interim Summary: [name] is a 88 y.o. male with medical history significant of hypertension, CKD stage 4, CAD s/p stenting, stroke, hyperparathyroidism who presents to the emergency department due to 3-day onset of flulike symptoms and generalized weakness.     Noted to have R knee pain, AKI, at intake and ultimately had a transient episode of hospital delirium that have all improved drastically. R knee evaluation/aspiration consistent with gout(elevated uric acid) improved drastically with colchicine - only given 3 day course due to AKI/CKD - can repeat in 14 days if necessary. PT/OT recommend SNF at discharge. Otherwise stable and agreeable for discharge.  Discharge Diagnoses:  Principal Problem:   Acute kidney injury [facility]) Active Problems:   Coronary artery disease   Essential hypertension   Mixed hyperlipidemia   Hyperparathyroidism ([place])   Stage 3b chronic kidney disease (HCC)   Generalized weakness   Right knee pain   Pulmonary nodule   Hypercalcemia   History of CVA (cerebrovascular accident)  Acute kidney injury superimposed on CKD stage IV, improving -BUN 87, creatinine 4.09 at intake (baseline creatinine at 2.0-2.4). back to baseline -CT renal stone: Negative for any acute findings. -Renally adjust medications, avoid nephrotoxic agents/dehydration/hypotension   Acute metabolic encephalopathy, likely hospital delirium, resolving -Continue precautions - family educated on sleep cycle issues with patient  sleeping all day -Oriented to person place and situation today, likely back to baseline   Hyperkalemia, stable Potassium likely to downtrend now back on lasix - repeat labs in next week   Generalized weakness/ambulatory dysfunction -Complicated by encephalopathy and gout flare, PT OT currently recommending SNF-reevaluation pending given improved mental status, patient hopeful to discharge home but may not be safe due to his lack of support at home currently living alone.   Right knee pain with effusion - acute gout flare, no known history -Uric acid positive on both blood draw and from knee -Colchicine x 3 doses given CKD, can repeat course in 14 days if necessary -Knee pain markedly improving   Hypercalcemia: Resolved -PTH elevated mildly - follow w/ PCP for further testing in the next few weeks to ensure not acute phase reactant given above   Pulmonary nodule, incidentally noted -CT abdomen pelvis without contrast showed groundglass  nodules measuring up to 17 mm in the left lower lobe, persistent based on prior imaging. Adenocarcinoma cannot be excluded. Follow up by CT is recommended in 12 months   CAD s/p stenting -Denies ACS symptoms.  Continue aspirin, Lipitor, Lopressor, Imdur   History of CVA -Continue aspirin, Lipitor   Essential hypertension Continue home amlodipine and hydralazine.  Losartan on hold 2/2 aki - likely to discontinue at discharge   Mixed hyperlipidemia -Continue Lipitor   Normocytic anemia: -Baseline hemoglobin in 9-10 range.    Discharge Instructions   Allergies as of 03/06/2022   No Known Allergies      Medication List     STOP taking these medications    losartan 100 MG tablet Commonly known as: COZAAR       TAKE these medications    AeroChamber Plus Flo-Vu Medium Misc  1 each by Other route once.   albuterol 108 (90 Base) MCG/ACT inhaler Commonly known as: ProAir HFA Inhale 2 puffs into the lungs every 6 (six) hours as needed  for wheezing or shortness of breath.   amLODipine 10 MG tablet Commonly known as: NORVASC TAKE 1 TABLET BY MOUTH EVERY DAY WITH evening meal What changed: See the new instructions.   aspirin EC 81 MG tablet Commonly known as: GNP Aspirin Take 1 tablet (81 mg total) by mouth daily. Swallow whole. What changed: See the new instructions.   atorvastatin 80 MG tablet Commonly known as: LIPITOR TAKE 1 TABLET BY MOUTH EVERY EVENING WITH A MEAL What changed: See the new instructions.   furosemide 40 MG tablet Commonly known as: LASIX TAKE 1 TABLET BY MOUTH EVERY DAY WITH morning meal What changed: See the new instructions.   hydrALAZINE 100 MG tablet Commonly known as: APRESOLINE TAKE 1 TABLET BY MOUTH 2 TIMES DAILY WITH morning and evening meals What changed: See the new instructions.   isosorbide mononitrate 120 MG 24 hr tablet Commonly known as: IMDUR TAKE 1 TABLET BY MOUTH EVERY EVENING WITH food What changed:  how much to take how to take this when to take this additional instructions   loratadine 10 MG tablet Commonly known as: Allergy Relief TAKE 1 TABLET BY MOUTH ONCE DAILY as needed for allergy symptoms What changed:  how much to take how to take this when to take this reasons to take this additional instructions   metoprolol tartrate 25 MG tablet Commonly known as: LOPRESSOR TAKE 1 TABLET BY MOUTH 2 TIMES DAILY WITH morning and evening meals What changed: See the new instructions.   nitroGLYCERIN 0.4 MG SL tablet Commonly known as: NITROSTAT PLACE 1 TABLET UNDER THE TONGUE EVERY 5 MINUTES UP TO 3x AS NEEDED FOR CHEST PAIN, SEEK MEDICAL ATTENTION IF NO RELIEF What changed:  how much to take how to take this when to take this reasons to take this additional instructions        Contact information for after-discharge care     Destination     [facility] Preferred SNF .   Service: Skilled Nursing Contact information: [address] [place] [phone]                    No Known Allergies  Consultations: Ortho   Procedures/Studies: CT Renal Stone Study  Result Date: 03/01/2022 CLINICAL DATA:  Flank pain, question kidney stone EXAM: CT ABDOMEN AND PELVIS WITHOUT CONTRAST TECHNIQUE: Multidetector CT imaging of the abdomen and pelvis was performed following the standard protocol without IV contrast. RADIATION DOSE REDUCTION: This exam was performed according to the departmental dose-optimization program which includes automated exposure control, adjustment of the mA and/or kV according to patient size and/or use of iterative reconstruction technique. COMPARISON:  05/02/2021 FINDINGS: Lower chest: Aortic and coronary atherosclerosis. Ground-glass nodules in the lower lungs measuring up to 17 mm in the left lower lobe on 4:24, persistent based on prior. Hepatobiliary: No focal liver abnormality.Chest ectomy. Pancreas: Unremarkable. Spleen: Unremarkable. Adrenals/Urinary Tract: Negative adrenals. No hydronephrosis or stone. Unremarkable bladder. Stomach/Bowel: Enterocolonic anastomosis in the right lower quadrant. Diffuse colonic stool. No obstructive pattern of small bowel filling. Vascular/Lymphatic: No acute vascular abnormality. Extensive atheromatous plaque affecting the aorta and branch vessels. No mass or adenopathy. Reproductive:No acute finding Other: No ascites or pneumoperitoneum. Musculoskeletal: No acute abnormalities. Advanced lumbar spine degeneration with extensive endplate irregularity and disc  collapse. Chronic T11 compression fracture. IMPRESSION: 1. No acute intra-abdominal finding. 2. Generalized stool retention, question constipation. 3. Ground-glass nodules measuring up to 17 mm in the left lower lobe, persistent based on prior imaging. Adenocarcinoma cannot be excluded. Follow up by CT is recommended in 12 months, with continued annual surveillance for a minimum  of 3 years. These recommendations are taken from:Recommendations for the Management of Subsolid Pulmonary Nodules Detected at CT: A Statement from the [facility] 2013; 266:1, 304-317. 4. Extensive atherosclerosis. Electronically Signed   By: Jorje Guild M.D.   On: 03/01/2022 04:23   CT Cervical Spine Wo Contrast  Result Date: 02/28/2022 CLINICAL DATA:  Trauma EXAM: CT CERVICAL SPINE WITHOUT CONTRAST TECHNIQUE: Multidetector CT imaging of the cervical spine was performed without intravenous contrast. Multiplanar CT image reconstructions were also generated. RADIATION DOSE REDUCTION: This exam was performed according to the departmental dose-optimization program which includes automated exposure control, adjustment of the mA and/or kV according to patient size and/or use of iterative reconstruction technique. COMPARISON:  04/29/2010 FINDINGS: Alignment: Normal. Skull base and vertebrae: No acute fracture. No primary bone lesion or focal pathologic process. Soft tissues and spinal canal: No prevertebral fluid or swelling. No visible canal hematoma. Disc levels: Extensive degenerative changes identified with loss of disc spaces and marginal osteophytes throughout the C-spine similar to the 2012 study. Upper chest: Negative. Other: None. IMPRESSION: 1. No acute traumatic abnormality. 2. Extensive degenerative disc disease. Electronically Signed   By: Sammie Bench M.D.   On: 02/28/2022 14:27   CT Head Wo Contrast  Result Date: 02/28/2022 CLINICAL DATA:  Head trauma, minor (Age >= 65y) EXAM: CT HEAD WITHOUT CONTRAST TECHNIQUE: Contiguous axial images were obtained from the base of the skull through the vertex without intravenous contrast. RADIATION DOSE REDUCTION: This exam was performed according to the departmental dose-optimization program which includes automated exposure control, adjustment of the mA and/or kV according to patient size and/or use of iterative reconstruction  technique. COMPARISON:  None Available. FINDINGS: Brain: There is periventricular white matter decreased attenuation consistent with small vessel ischemic changes. Ventricles, sulci and cisterns are prominent consistent with age related involutional changes. No acute intracranial hemorrhage, mass effect or shift. No hydrocephalus. Focal encephalomalacia left basal ganglia consistent with old lacunar CVA. Vascular: No hyperdense vessel or unexpected calcification. Calvarium: No acute osseous abnormalities. There is a right frontal craniotomy. Sinuses/Orbits: No acute finding. IMPRESSION: Atrophy and chronic small vessel ischemic changes. 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 No acute intracranial process identified. Electronically Signed   By: Sammie Bench M.D.   On: 02/28/2022 14:24   DG Knee Complete 4 Views Right  Result Date: 02/28/2022 CLINICAL DATA:  Right knee pain and swelling EXAM: RIGHT KNEE - COMPLETE 4+ VIEW COMPARISON:  None Available. FINDINGS: No fracture or dislocation of the right knee. 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 There is fragmentation of the superior aspect of the patella with loose bodies in the superior joint space. Large knee joint effusion. Vascular calcinosis. IMPRESSION: 1. No fracture or dislocation of the right knee. 2. 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 3. 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 4. Large knee joint effusion. Electronically Signed   By: Delanna Ahmadi M.D.   On: 02/28/2022 13:44   DG Chest 2 View  Result  Date: 02/28/2022 CLINICAL DATA:  Flu-like symptoms EXAM: CHEST - 2 VIEW COMPARISON:  11/30/2019 FINDINGS: The heart size and mediastinal contours are within normal limits. Both lungs are clear. The visualized  skeletal structures are unremarkable. IMPRESSION: No active cardiopulmonary disease. Electronically Signed   By: Elmer Picker M.D.   On: 02/28/2022 13:41     Subjective: No acute issues/events overnight   Discharge Exam: Vitals:   03/05/22 2008 03/06/22 0456  BP: 120/60 131/60  Pulse: 70 (!) 48  Resp: 17 17  Temp: (!) 97.4 F (36.3 C) 98.6 F (37 C)  SpO2: 100% 100%   Vitals:   03/05/22 0753 03/05/22 1524 03/05/22 2008 03/06/22 0456  BP: (!) 150/59 (!) 136/59 120/60 131/60  Pulse: (!) 49 75 70 (!) 48  Resp:   17 17  Temp:  97.7 F (36.5 C) (!) 97.4 F (36.3 C) 98.6 F (37 C)  TempSrc:  Oral Oral   SpO2: 100% 100% 100% 100%  Weight:      Height:        General: Pt is alert, awake, not in acute distress Cardiovascular: RRR, S1/S2 +, no rubs, no gallops Respiratory: CTA bilaterally, no wheezing, no rhonchi Abdominal: Soft, NT, ND, bowel sounds + Extremities: no edema, no cyanosis    The results of significant diagnostics from this hospitalization (including imaging, microbiology, ancillary and laboratory) are listed below for reference.     Microbiology: Recent Results (from the past 240 hour(s))  Resp panel by RT-PCR (RSV, Flu A&B, Covid) Anterior Nasal Swab     Status: None   Collection Time: 02/28/22 12:43 PM   Specimen: Anterior Nasal Swab  Result Value Ref Range Status   SARS Coronavirus 2 by RT PCR NEGATIVE NEGATIVE Final    Comment: (NOTE) SARS-CoV-2 target nucleic acids are NOT DETECTED.  The SARS-CoV-2 RNA is generally detectable in upper respiratory specimens during the acute phase of infection. The lowest concentration of SARS-CoV-2 viral copies this assay can detect is 138 copies/mL. A negative result does not preclude SARS-Cov-2 infection and should not be used as the sole basis for treatment or other patient management decisions. A negative result may occur with  improper specimen collection/handling, submission of specimen other than  nasopharyngeal swab, presence of viral mutation(s) within the areas targeted by this assay, and inadequate number of viral copies(<138 copies/mL). A negative result must be combined with clinical observations, patient history, and epidemiological information. The expected result is Negative.  Fact Sheet for Patients:  EntrepreneurPulse.com.au  Fact Sheet for Healthcare Providers:  IncredibleEmployment.be  This test is no t yet approved or cleared by the [place] FDA and  has been authorized for detection and/or diagnosis of SARS-CoV-2 by FDA under an Emergency Use Authorization (EUA). This EUA will remain  in effect (meaning this test can be used) for the duration of the COVID-19 declaration under Section 564(b)(1) of the Act, 21 U.S.C.section 360bbb-3(b)(1), unless the authorization is terminated  or revoked sooner.       Influenza A by PCR NEGATIVE NEGATIVE Final   Influenza B by PCR NEGATIVE NEGATIVE Final    Comment: (NOTE) The Xpert Xpress SARS-CoV-2/FLU/RSV plus assay is intended as an aid in the diagnosis of influenza from Nasopharyngeal swab specimens and should not be used as a sole basis for treatment. Nasal washings and aspirates are unacceptable for Xpert Xpress SARS-CoV-2/FLU/RSV testing.  Fact Sheet for Patients: EntrepreneurPulse.com.au  Fact Sheet for Healthcare Providers: IncredibleEmployment.be  This test is not yet approved or cleared  by the [place] and has been authorized for detection and/or diagnosis of SARS-CoV-2 by FDA under an Emergency Use Authorization (EUA). This EUA will remain in effect (meaning this test can be used) for the duration of the COVID-19 declaration under Section 564(b)(1) of the Act, 21 U.S.C. section 360bbb-3(b)(1), unless the authorization is terminated or revoked.     Resp Syncytial Virus by PCR NEGATIVE NEGATIVE Final    Comment:  (NOTE) Fact Sheet for Patients: EntrepreneurPulse.com.au  Fact Sheet for Healthcare Providers: IncredibleEmployment.be  This test is not yet approved or cleared by the [place] FDA and has been authorized for detection and/or diagnosis of SARS-CoV-2 by FDA under an Emergency Use Authorization (EUA). This EUA will remain in effect (meaning this test can be used) for the duration of the COVID-19 declaration under Section 564(b)(1) of the Act, 21 U.S.C. section 360bbb-3(b)(1), unless the authorization is terminated or revoked.  Performed at [facility], [place] [address], [place], [place] [address]   Body fluid culture w Gram Stain     Status: None (Preliminary result)   Collection Time: 03/03/22  4:29 PM   Specimen: Synovium; Body Fluid  Result Value Ref Range Status   Specimen Description SYNOVIAL  Final   Special Requests RIGHT KNEE  Final   Gram Stain   Final    RARE WBC PRESENT, PREDOMINANTLY PMN NO ORGANISMS SEEN    Culture   Final    NO GROWTH 2 DAYS Performed at [facility], [address], [place], [place] [address]    Report Status PENDING  Incomplete     Labs: BNP (last 3 results) Recent Labs    05/02/21 1234  BNP 825.0*   Basic Metabolic Panel: Recent Labs  Lab 03/01/22 0810 03/02/22 0217 03/03/22 0300 03/03/22 1427 03/04/22 0645 03/05/22 0044 03/06/22 0216  NA  --    < > 137 137 136 136 138  K  --    < > 5.6* 4.9 5.2* 4.8 5.1  CL  --    < > 114* 111 108 110 106  CO2  --    < > 15* 21* '22 22 22  '$ GLUCOSE  --    < > 84 109* 127* 113* 87  BUN  --    < > 70* 63* 51* 48* 48*  CREATININE 3.52*   < > 2.58* 2.50* 2.34* 2.34* 2.44*  CALCIUM  --    < > 9.5 9.6 9.8 9.9 9.4  MG 1.9  --   --   --   --   --   --   PHOS 4.9*  --   --   --   --   --   --    < > = values in this interval not displayed.   Liver Function Tests: Recent Labs  Lab 03/02/22 0217  AST 32  ALT 26  ALKPHOS 56  BILITOT 0.4  PROT  6.3*  ALBUMIN 2.2*   No results for input(s): "LIPASE", "AMYLASE" in the last 168 hours. No results for input(s): "AMMONIA" in the last 168 hours. CBC: Recent Labs  Lab 02/28/22 2350 03/01/22 0810 03/02/22 0217  WBC 6.9 6.4 4.7  HGB 11.7* 10.8* 8.4*  HCT 36.4* 34.2* 25.8*  MCV 87.7 87.9 86.3  PLT 186 138* 157   Cardiac Enzymes: No results for input(s): "CKTOTAL", "CKMB", "CKMBINDEX", "TROPONINI" in the last 168 hours. BNP: Invalid input(s): "POCBNP" CBG: Recent Labs  Lab 03/04/22 0747 03/04/22 2046 03/05/22  0754  GLUCAP 127* 121* 107*   D-Dimer No results for input(s): "DDIMER" in the last 72 hours. Hgb A1c No results for input(s): "HGBA1C" in the last 72 hours. Lipid Profile No results for input(s): "CHOL", "HDL", "LDLCALC", "TRIG", "CHOLHDL", "LDLDIRECT" in the last 72 hours. Thyroid function studies No results for input(s): "TSH", "T4TOTAL", "T3FREE", "THYROIDAB" in the last 72 hours.  Invalid input(s): "FREET3" Anemia work up No results for input(s): "VITAMINB12", "FOLATE", "FERRITIN", "TIBC", "IRON", "RETICCTPCT" in the last 72 hours. Urinalysis    Component Value Date/Time   COLORURINE YELLOW 03/01/2022 0400   APPEARANCEUR HAZY (A) 03/01/2022 0400   LABSPEC 1.011 03/01/2022 0400   PHURINE 5.0 03/01/2022 0400   GLUCOSEU NEGATIVE 03/01/2022 0400   HGBUR NEGATIVE 03/01/2022 0400   BILIRUBINUR NEGATIVE 03/01/2022 0400   KETONESUR NEGATIVE 03/01/2022 0400   PROTEINUR NEGATIVE 03/01/2022 0400   UROBILINOGEN 1.0 02/11/2014 1106   NITRITE NEGATIVE 03/01/2022 0400   LEUKOCYTESUR NEGATIVE 03/01/2022 0400   Sepsis Labs Recent Labs  Lab 02/28/22 2350 03/01/22 0810 03/02/22 0217  WBC 6.9 6.4 4.7   Microbiology Recent Results (from the past 240 hour(s))  Resp panel by RT-PCR (RSV, Flu A&B, Covid) Anterior Nasal Swab     Status: None   Collection Time: 02/28/22 12:43 PM   Specimen: Anterior Nasal Swab  Result Value Ref Range Status   SARS Coronavirus 2 by  RT PCR NEGATIVE NEGATIVE Final    Comment: (NOTE) SARS-CoV-2 target nucleic acids are NOT DETECTED.  The SARS-CoV-2 RNA is generally detectable in upper respiratory specimens during the acute phase of infection. The lowest concentration of SARS-CoV-2 viral copies this assay can detect is 138 copies/mL. A negative result does not preclude SARS-Cov-2 infection and should not be used as the sole basis for treatment or other patient management decisions. A negative result may occur with  improper specimen collection/handling, submission of specimen other than nasopharyngeal swab, presence of viral mutation(s) within the areas targeted by this assay, and inadequate number of viral copies(<138 copies/mL). A negative result must be combined with clinical observations, patient history, and epidemiological information. The expected result is Negative.  Fact Sheet for Patients:  EntrepreneurPulse.com.au  Fact Sheet for Healthcare Providers:  IncredibleEmployment.be  This test is no t yet approved or cleared by the [place] FDA and  has been authorized for detection and/or diagnosis of SARS-CoV-2 by FDA under an Emergency Use Authorization (EUA). This EUA will remain  in effect (meaning this test can be used) for the duration of the COVID-19 declaration under Section 564(b)(1) of the Act, 21 U.S.C.section 360bbb-3(b)(1), unless the authorization is terminated  or revoked sooner.       Influenza A by PCR NEGATIVE NEGATIVE Final   Influenza B by PCR NEGATIVE NEGATIVE Final    Comment: (NOTE) The Xpert Xpress SARS-CoV-2/FLU/RSV plus assay is intended as an aid in the diagnosis of influenza from Nasopharyngeal swab specimens and should not be used as a sole basis for treatment. Nasal washings and aspirates are unacceptable for Xpert Xpress SARS-CoV-2/FLU/RSV testing.  Fact Sheet for Patients: EntrepreneurPulse.com.au  Fact Sheet  for Healthcare Providers: IncredibleEmployment.be  This test is not yet approved or cleared by the [place] FDA and has been authorized for detection and/or diagnosis of SARS-CoV-2 by FDA under an Emergency Use Authorization (EUA). This EUA will remain in effect (meaning this test can be used) for the duration of the COVID-19 declaration under Section 564(b)(1) of the Act, 21 U.S.C. section 360bbb-3(b)(1), unless the authorization is  terminated or revoked.     Resp Syncytial Virus by PCR NEGATIVE NEGATIVE Final    Comment: (NOTE) Fact Sheet for Patients: EntrepreneurPulse.com.au  Fact Sheet for Healthcare Providers: IncredibleEmployment.be  This test is not yet approved or cleared by the [place] FDA and has been authorized for detection and/or diagnosis of SARS-CoV-2 by FDA under an Emergency Use Authorization (EUA). This EUA will remain in effect (meaning this test can be used) for the duration of the COVID-19 declaration under Section 564(b)(1) of the Act, 21 U.S.C. section 360bbb-3(b)(1), unless the authorization is terminated or revoked.  Performed at [facility], [place] [address] [place], [place] [address]   Body fluid culture w Gram Stain     Status: None (Preliminary result)   Collection Time: 03/03/22  4:29 PM   Specimen: Synovium; Body Fluid  Result Value Ref Range Status   Specimen Description SYNOVIAL  Final   Special Requests RIGHT KNEE  Final   Gram Stain   Final    RARE WBC PRESENT, PREDOMINANTLY PMN NO ORGANISMS SEEN    Culture   Final    NO GROWTH 2 DAYS Performed at [facility], [address] [place], [place] [address]    Report Status PENDING  Incomplete     Time coordinating discharge: Over 30 minutes  SIGNED:   Little Ishikawa, DO [facility] 03/06/2022, 7:16 AM Pager   If 7PM-7AM, please contact night-coverage [email or web address]

## 2022-03-06 NOTE — Progress Notes (Signed)
RN attempted to call report to facility, RN at facility not available to take report. RN left call back number.

## 2022-03-06 NOTE — Progress Notes (Signed)
Mobility Specialist Progress Note:   03/06/22 1318  Mobility  Activity Ambulated with assistance in hallway  Level of Assistance Contact guard assist, steadying assist  Assistive Device Front wheel walker  Distance Ambulated (ft) 200 ft  Activity Response Tolerated well;RN notified  Mobility Referral Yes  $Mobility charge 1 Mobility   Pt received ambulating in hall and pleasantly confused. Pt asking for directions to his room and was unable to recall which room was his. No complaints of pain or SOB. Pt left in chair with chair alarm on, belt alarm on, all needs met, and call bell in reach. RN aware.   Andrey Campanile Mobility Specialist Please contact via SecureChat or  Rehab office at [phone]

## 2022-03-07 LAB — BODY FLUID CULTURE W GRAM STAIN: Culture: NO GROWTH

## 2022-03-19 ENCOUNTER — Ambulatory Visit (INDEPENDENT_AMBULATORY_CARE_PROVIDER_SITE_OTHER): Payer: 59 | Admitting: Internal Medicine

## 2022-03-19 ENCOUNTER — Encounter: Payer: Self-pay | Admitting: Internal Medicine

## 2022-03-19 VITALS — BP 130/70 | HR 62 | Resp 14 | Ht 65.0 in | Wt 135.0 lb

## 2022-03-19 DIAGNOSIS — I2510 Atherosclerotic heart disease of native coronary artery without angina pectoris: Secondary | ICD-10-CM

## 2022-03-19 DIAGNOSIS — E213 Hyperparathyroidism, unspecified: Secondary | ICD-10-CM | POA: Diagnosis not present

## 2022-03-19 DIAGNOSIS — G119 Hereditary ataxia, unspecified: Secondary | ICD-10-CM

## 2022-03-19 DIAGNOSIS — I10 Essential (primary) hypertension: Secondary | ICD-10-CM

## 2022-03-19 DIAGNOSIS — E785 Hyperlipidemia, unspecified: Secondary | ICD-10-CM | POA: Diagnosis not present

## 2022-03-19 DIAGNOSIS — J449 Chronic obstructive pulmonary disease, unspecified: Secondary | ICD-10-CM

## 2022-03-19 NOTE — Progress Notes (Signed)
Subjective:    Patient ID: [name], male   DOB: Mar 01, 1931, 89 y.o.   MRN: [id number]   HPI  Here with his adult granddaughter, [name]. Reportedly, she will now be our family contact for his care. He has a long history, despite many home visits to set up meds in pillboxes, home delivery in prepackaged weekly bubble packs, even with family and home health caregiver present to receive information,  of not taking meds.    Has his Mohammed Kindle beard and mustache going.  Hospitalized 02/28/2022 with acute on chronic kidney injury, dehydration, weakness and right knee pain.  Reportedly had had a 3 day history of flu like symptoms. Discharged 03/06/2022 to SNF for rehab prior to going home.  Diagnosed ultimately with gout of the knee.    Patient and granddaughter are not clear on what medications he was taking at discharge from rehab.   He will have a home health caregiver at home for at least one meal and support with medications for at least 2 of his dosings.   Informed granddaughter we have his meds set up  to receive pre packaged  for 3 times a day dosing delivered monthly from [employer].   She is not certain if he has been receiving.  States he is feeling well, without chest pain, dyspnea  Current Meds  Medication Sig   albuterol (PROAIR HFA) 108 (90 Base) MCG/ACT inhaler Inhale 2 puffs into the lungs every 6 (six) hours as needed for wheezing or shortness of breath.  Again, patient and granddaughter are unaware of his med list at this point  No Known Allergies   Review of Systems    Objective:   BP 130/70 (BP Location: Right Arm, Patient Position: Sitting, Cuff Size: Normal)   Pulse 62   Resp 14   Ht 5\' 5"  (1.651 m)   Wt 135 lb (61.2 kg)   BMI 22.47 kg/m   Physical Exam NAD Lungs:  CTA CV:  RRR without murmur or rub.  Radial and DP pulses normal and equal' Abd:  S, NT, + BS LE:  No edema. MS:  No joint swelling or redness. Neuro:  ataxic gait  at baseline.   Uses walker.   Assessment & Plan    Hypertension:  controlled today, so suspect taking his usual meds.  Granddaughter [name] will check his paperwork to see where he was staying so we can send for discharge meds.  Call into [facility] to be certain he is receiving his monthly delivery of meds.    2.  Recent hospitalization with deconditioning and AKI on CKD:    Creatinine essentially baseline at discharge from hospital.  Will redraw blood work at home visit.  Has a history of poorly controlled hypertension with progressive kidney disease.  3.  Dyslipidemia:  unable to get him when fasting.  Fasting labs at next home visit.  4.  Hyperparathyroidism:  Calcium at next visit.  Was fine at last check, though suspect albumin levels low and would calculate higher.    5.  CAD:  asymptomatic.  6.  Chronic ataxia:  stable.  7.  Social:  [name], granddaughter, will now be our contact.  Will schedule every 2 month OV at his home as do not want him to attempt driving and [name] has young children.   This will allow me to see what he is doing/not doing with meds and possibly speak with his home care person to get a clear picture  of what support he is actually getting.  8.  Tobacco:  suspect he continues to smoke, though he is non-committal today about what he is actually doing.  He does also have COPD, but never uses inhalers.  States he does not need.  They are available from pharmacy if he should need.

## 2022-03-19 NOTE — Patient Instructions (Signed)
Needs:  High dose flu vaccine, COVID booster for 2023-2024 and RSV vaccine--see if can get at [facility]

## 2022-03-20 ENCOUNTER — Other Ambulatory Visit (HOSPITAL_COMMUNITY): Payer: Self-pay

## 2022-03-24 ENCOUNTER — Telehealth: Payer: Self-pay | Admitting: Psychology

## 2022-04-06 NOTE — Telephone Encounter (Signed)
Confirmed with patient's granddaughter, Bryson Gavia that all meds were delivered

## 2022-04-13 ENCOUNTER — Other Ambulatory Visit (HOSPITAL_COMMUNITY): Payer: Self-pay

## 2022-05-19 ENCOUNTER — Other Ambulatory Visit (INDEPENDENT_AMBULATORY_CARE_PROVIDER_SITE_OTHER): Payer: 59 | Admitting: Internal Medicine

## 2022-05-19 ENCOUNTER — Telehealth: Payer: Self-pay

## 2022-05-19 VITALS — BP 130/60 | HR 64 | Resp 16

## 2022-05-19 DIAGNOSIS — E785 Hyperlipidemia, unspecified: Secondary | ICD-10-CM

## 2022-05-19 DIAGNOSIS — I10 Essential (primary) hypertension: Secondary | ICD-10-CM | POA: Diagnosis not present

## 2022-05-19 DIAGNOSIS — E213 Hyperparathyroidism, unspecified: Secondary | ICD-10-CM | POA: Diagnosis not present

## 2022-05-19 DIAGNOSIS — I2510 Atherosclerotic heart disease of native coronary artery without angina pectoris: Secondary | ICD-10-CM

## 2022-05-19 DIAGNOSIS — R058 Other specified cough: Secondary | ICD-10-CM

## 2022-05-19 DIAGNOSIS — J449 Chronic obstructive pulmonary disease, unspecified: Secondary | ICD-10-CM

## 2022-05-19 DIAGNOSIS — D649 Anemia, unspecified: Secondary | ICD-10-CM

## 2022-05-19 DIAGNOSIS — N184 Chronic kidney disease, stage 4 (severe): Secondary | ICD-10-CM

## 2022-05-19 NOTE — Telephone Encounter (Signed)
Adrian Blackwater from [facility] called to inform us that the patient will be discharged from home health after completing all goals. They wanted to inform us that patient has also requested referral to outpatient PT

## 2022-05-20 LAB — CBC WITH DIFFERENTIAL/PLATELET
Basophils Absolute: 0.1 10*3/uL (ref 0.0–0.2)
Basos: 1 %
EOS (ABSOLUTE): 1.1 10*3/uL — ABNORMAL HIGH (ref 0.0–0.4)
Eos: 16 %
Hematocrit: 34.4 % — ABNORMAL LOW (ref 37.5–51.0)
Hemoglobin: 11.2 g/dL — ABNORMAL LOW (ref 13.0–17.7)
Immature Grans (Abs): 0 10*3/uL (ref 0.0–0.1)
Immature Granulocytes: 0 %
Lymphocytes Absolute: 1.4 10*3/uL (ref 0.7–3.1)
Lymphs: 20 %
MCH: 28.4 pg (ref 26.6–33.0)
MCHC: 32.6 g/dL (ref 31.5–35.7)
MCV: 87 fL (ref 79–97)
Monocytes Absolute: 0.7 10*3/uL (ref 0.1–0.9)
Monocytes: 10 %
Neutrophils Absolute: 3.6 10*3/uL (ref 1.4–7.0)
Neutrophils: 53 %
Platelets: 173 10*3/uL (ref 150–450)
RBC: 3.95 x10E6/uL — ABNORMAL LOW (ref 4.14–5.80)
RDW: 13.4 % (ref 11.6–15.4)
WBC: 6.8 10*3/uL (ref 3.4–10.8)

## 2022-05-25 NOTE — Telephone Encounter (Signed)
Patients granddaughter confirmed that it is for ataxia. Patient is still high risk for falls

## 2022-06-10 ENCOUNTER — Other Ambulatory Visit: Payer: Self-pay | Admitting: Internal Medicine

## 2022-06-11 ENCOUNTER — Telehealth: Payer: Self-pay

## 2022-06-11 NOTE — Telephone Encounter (Signed)
Patients granddaughter called asking for an appointment for [facility]. He requested the appointment but wont tell her what he wants the appointment for. She reports that he does not seem like himself, but is unable to give details.   Will add to the wait list for next available appointment

## 2022-06-19 ENCOUNTER — Other Ambulatory Visit: Payer: Self-pay

## 2022-06-19 ENCOUNTER — Encounter (HOSPITAL_COMMUNITY): Payer: Self-pay

## 2022-06-19 ENCOUNTER — Emergency Department (HOSPITAL_COMMUNITY): Payer: 59

## 2022-06-19 ENCOUNTER — Inpatient Hospital Stay (HOSPITAL_COMMUNITY)
Admission: EM | Admit: 2022-06-19 | Discharge: 2022-06-23 | DRG: 178 | Disposition: A | Discharge status: Skilled Nursing Facility | Payer: 59 | Attending: Infectious Diseases | Admitting: Infectious Diseases

## 2022-06-19 DIAGNOSIS — I130 Hypertensive heart and chronic kidney disease with heart failure and stage 1 through stage 4 chronic kidney disease, or unspecified chronic kidney disease: Secondary | ICD-10-CM | POA: Diagnosis present

## 2022-06-19 DIAGNOSIS — E860 Dehydration: Secondary | ICD-10-CM | POA: Diagnosis not present

## 2022-06-19 DIAGNOSIS — U071 COVID-19: Principal | ICD-10-CM

## 2022-06-19 DIAGNOSIS — Z8673 Personal history of transient ischemic attack (TIA), and cerebral infarction without residual deficits: Secondary | ICD-10-CM

## 2022-06-19 DIAGNOSIS — I2510 Atherosclerotic heart disease of native coronary artery without angina pectoris: Secondary | ICD-10-CM | POA: Diagnosis present

## 2022-06-19 DIAGNOSIS — I509 Heart failure, unspecified: Secondary | ICD-10-CM | POA: Diagnosis present

## 2022-06-19 DIAGNOSIS — R1030 Lower abdominal pain, unspecified: Secondary | ICD-10-CM

## 2022-06-19 DIAGNOSIS — Z681 Body mass index (BMI) 19 or less, adult: Secondary | ICD-10-CM

## 2022-06-19 DIAGNOSIS — N184 Chronic kidney disease, stage 4 (severe): Secondary | ICD-10-CM | POA: Diagnosis present

## 2022-06-19 DIAGNOSIS — I252 Old myocardial infarction: Secondary | ICD-10-CM

## 2022-06-19 DIAGNOSIS — M5136 Other intervertebral disc degeneration, lumbar region: Secondary | ICD-10-CM | POA: Diagnosis present

## 2022-06-19 DIAGNOSIS — R001 Bradycardia, unspecified: Secondary | ICD-10-CM

## 2022-06-19 DIAGNOSIS — Z85038 Personal history of other malignant neoplasm of large intestine: Secondary | ICD-10-CM

## 2022-06-19 DIAGNOSIS — R64 Cachexia: Secondary | ICD-10-CM | POA: Diagnosis present

## 2022-06-19 DIAGNOSIS — R911 Solitary pulmonary nodule: Secondary | ICD-10-CM | POA: Diagnosis present

## 2022-06-19 DIAGNOSIS — F329 Major depressive disorder, single episode, unspecified: Secondary | ICD-10-CM

## 2022-06-19 DIAGNOSIS — R0789 Other chest pain: Secondary | ICD-10-CM

## 2022-06-19 DIAGNOSIS — N1832 Chronic kidney disease, stage 3b: Secondary | ICD-10-CM | POA: Diagnosis present

## 2022-06-19 DIAGNOSIS — F17210 Nicotine dependence, cigarettes, uncomplicated: Secondary | ICD-10-CM | POA: Diagnosis present

## 2022-06-19 DIAGNOSIS — Z7982 Long term (current) use of aspirin: Secondary | ICD-10-CM

## 2022-06-19 DIAGNOSIS — Z955 Presence of coronary angioplasty implant and graft: Secondary | ICD-10-CM

## 2022-06-19 DIAGNOSIS — N179 Acute kidney failure, unspecified: Secondary | ICD-10-CM

## 2022-06-19 DIAGNOSIS — Z751 Person awaiting admission to adequate facility elsewhere: Secondary | ICD-10-CM

## 2022-06-19 DIAGNOSIS — Z9049 Acquired absence of other specified parts of digestive tract: Secondary | ICD-10-CM

## 2022-06-19 DIAGNOSIS — Z79899 Other long term (current) drug therapy: Secondary | ICD-10-CM

## 2022-06-19 DIAGNOSIS — E43 Unspecified severe protein-calorie malnutrition: Secondary | ICD-10-CM | POA: Insufficient documentation

## 2022-06-19 DIAGNOSIS — G3184 Mild cognitive impairment, so stated: Secondary | ICD-10-CM | POA: Diagnosis present

## 2022-06-19 DIAGNOSIS — E441 Mild protein-calorie malnutrition: Secondary | ICD-10-CM

## 2022-06-19 DIAGNOSIS — Z823 Family history of stroke: Secondary | ICD-10-CM

## 2022-06-19 LAB — CBC WITH DIFFERENTIAL/PLATELET
Abs Immature Granulocytes: 0.02 10*3/uL (ref 0.00–0.07)
Basophils Absolute: 0 10*3/uL (ref 0.0–0.1)
Basophils Relative: 1 %
Eosinophils Absolute: 0.3 10*3/uL (ref 0.0–0.5)
Eosinophils Relative: 5 %
HCT: 34.8 % — ABNORMAL LOW (ref 39.0–52.0)
Hemoglobin: 10.6 g/dL — ABNORMAL LOW (ref 13.0–17.0)
Immature Granulocytes: 0 %
Lymphocytes Relative: 20 %
Lymphs Abs: 1.2 10*3/uL (ref 0.7–4.0)
MCH: 27.2 pg (ref 26.0–34.0)
MCHC: 30.5 g/dL (ref 30.0–36.0)
MCV: 89.5 fL (ref 80.0–100.0)
Monocytes Absolute: 0.7 10*3/uL (ref 0.1–1.0)
Monocytes Relative: 11 %
Neutro Abs: 3.8 10*3/uL (ref 1.7–7.7)
Neutrophils Relative %: 63 %
Platelets: 255 10*3/uL (ref 150–400)
RBC: 3.89 MIL/uL — ABNORMAL LOW (ref 4.22–5.81)
RDW: 13.7 % (ref 11.5–15.5)
WBC: 6 10*3/uL (ref 4.0–10.5)
nRBC: 0 % (ref 0.0–0.2)

## 2022-06-19 LAB — BASIC METABOLIC PANEL
Anion gap: 12 (ref 5–15)
BUN: 89 mg/dL — ABNORMAL HIGH (ref 8–23)
CO2: 22 mmol/L (ref 22–32)
Calcium: 10.9 mg/dL — ABNORMAL HIGH (ref 8.9–10.3)
Chloride: 100 mmol/L (ref 98–111)
Creatinine, Ser: 3.27 mg/dL — ABNORMAL HIGH (ref 0.61–1.24)
GFR, Estimated: 17 mL/min — ABNORMAL LOW (ref >60)
Glucose, Bld: 101 mg/dL — ABNORMAL HIGH (ref 70–99)
Potassium: 4.4 mmol/L (ref 3.5–5.1)
Sodium: 134 mmol/L — ABNORMAL LOW (ref 135–145)

## 2022-06-19 NOTE — ED Triage Notes (Signed)
PT BIB [facility] from home c/o generalized weakness x1 week. Pt denies any pain.

## 2022-06-19 NOTE — Telephone Encounter (Signed)
Huntley Dec our LCSW did a home visit and found the [name] did not seem will and his BP was very low.  Per Dr Delrae Alfred instructions, [name] (granddaughter) was asked to take patient to an urgent care to be seen today. She reported that she will call EMS to have him transported to an urgent care.

## 2022-06-19 NOTE — ED Provider Triage Note (Signed)
Emergency Medicine Provider Triage Evaluation Note  [name] , a 89 y.o. male  was evaluated in triage.  Pt complains of presents with no complaints, states that his family brought here because they state he was weak.  He is endorsing any headache change in vision paresthesias or weakness of lower extremities and denies any chest pain shortness of breath cough congestion some pains nausea vomit diarrhea.  On my exam the patient has a productive cough.  Review of Systems  Positive: Cough, phlegm Negative: Chest pain, shortness of breath  Physical Exam  BP (!) 112/55 (BP Location: Right Arm)   Pulse (!) 47   Temp 97.6 F (36.4 C) (Oral)   Resp 16   Ht 5\' 7"  (1.702 m)   Wt 62.1 kg   SpO2 100%   BMI 21.46 kg/m  Gen:   Awake, no distress   Resp:  Normal effort  MSK:   Moves extremities without difficulty  Other:  No facial asymmetry no difficulty with word finding following two-step commands there is no unilateral weakness present.  Medical Decision Making  Medically screening exam initiated at 10:58 PM.  Appropriate orders placed.  Marinell Blight was informed that the remainder of the evaluation will be completed by another provider, this initial triage assessment does not replace that evaluation, and the importance of remaining in the ED until their evaluation is complete.  Lab work imaging been ordered will need further workup.   Carroll Sage, PA-C 06/19/22 2307

## 2022-06-20 ENCOUNTER — Emergency Department (HOSPITAL_COMMUNITY): Payer: 59

## 2022-06-20 DIAGNOSIS — Z955 Presence of coronary angioplasty implant and graft: Secondary | ICD-10-CM | POA: Diagnosis not present

## 2022-06-20 DIAGNOSIS — Z9049 Acquired absence of other specified parts of digestive tract: Secondary | ICD-10-CM | POA: Diagnosis not present

## 2022-06-20 DIAGNOSIS — M5136 Other intervertebral disc degeneration, lumbar region: Secondary | ICD-10-CM | POA: Diagnosis present

## 2022-06-20 DIAGNOSIS — I130 Hypertensive heart and chronic kidney disease with heart failure and stage 1 through stage 4 chronic kidney disease, or unspecified chronic kidney disease: Secondary | ICD-10-CM | POA: Diagnosis present

## 2022-06-20 DIAGNOSIS — F329 Major depressive disorder, single episode, unspecified: Secondary | ICD-10-CM | POA: Diagnosis present

## 2022-06-20 DIAGNOSIS — Z8673 Personal history of transient ischemic attack (TIA), and cerebral infarction without residual deficits: Secondary | ICD-10-CM | POA: Diagnosis not present

## 2022-06-20 DIAGNOSIS — U071 COVID-19: Secondary | ICD-10-CM | POA: Diagnosis present

## 2022-06-20 DIAGNOSIS — R64 Cachexia: Secondary | ICD-10-CM | POA: Diagnosis present

## 2022-06-20 DIAGNOSIS — F17210 Nicotine dependence, cigarettes, uncomplicated: Secondary | ICD-10-CM | POA: Diagnosis present

## 2022-06-20 DIAGNOSIS — Z7982 Long term (current) use of aspirin: Secondary | ICD-10-CM | POA: Diagnosis not present

## 2022-06-20 DIAGNOSIS — I252 Old myocardial infarction: Secondary | ICD-10-CM | POA: Diagnosis not present

## 2022-06-20 DIAGNOSIS — G3184 Mild cognitive impairment, so stated: Secondary | ICD-10-CM | POA: Diagnosis present

## 2022-06-20 DIAGNOSIS — E441 Mild protein-calorie malnutrition: Secondary | ICD-10-CM | POA: Diagnosis present

## 2022-06-20 DIAGNOSIS — E46 Unspecified protein-calorie malnutrition: Secondary | ICD-10-CM | POA: Diagnosis not present

## 2022-06-20 DIAGNOSIS — Z823 Family history of stroke: Secondary | ICD-10-CM | POA: Diagnosis not present

## 2022-06-20 DIAGNOSIS — Z681 Body mass index (BMI) 19 or less, adult: Secondary | ICD-10-CM | POA: Diagnosis not present

## 2022-06-20 DIAGNOSIS — Z751 Person awaiting admission to adequate facility elsewhere: Secondary | ICD-10-CM | POA: Diagnosis not present

## 2022-06-20 DIAGNOSIS — N184 Chronic kidney disease, stage 4 (severe): Secondary | ICD-10-CM | POA: Diagnosis present

## 2022-06-20 DIAGNOSIS — E860 Dehydration: Secondary | ICD-10-CM | POA: Diagnosis present

## 2022-06-20 DIAGNOSIS — N179 Acute kidney failure, unspecified: Secondary | ICD-10-CM | POA: Diagnosis present

## 2022-06-20 DIAGNOSIS — N1831 Chronic kidney disease, stage 3a: Secondary | ICD-10-CM | POA: Diagnosis not present

## 2022-06-20 DIAGNOSIS — I2510 Atherosclerotic heart disease of native coronary artery without angina pectoris: Secondary | ICD-10-CM | POA: Diagnosis present

## 2022-06-20 DIAGNOSIS — Z79899 Other long term (current) drug therapy: Secondary | ICD-10-CM | POA: Diagnosis not present

## 2022-06-20 DIAGNOSIS — I509 Heart failure, unspecified: Secondary | ICD-10-CM | POA: Diagnosis present

## 2022-06-20 DIAGNOSIS — Z85038 Personal history of other malignant neoplasm of large intestine: Secondary | ICD-10-CM | POA: Diagnosis not present

## 2022-06-20 DIAGNOSIS — R911 Solitary pulmonary nodule: Secondary | ICD-10-CM | POA: Diagnosis present

## 2022-06-20 LAB — MAGNESIUM: Magnesium: 2.2 mg/dL (ref 1.7–2.4)

## 2022-06-20 LAB — RESP PANEL BY RT-PCR (RSV, FLU A&B, COVID)  RVPGX2
Influenza A by PCR: NEGATIVE
Influenza B by PCR: NEGATIVE
Resp Syncytial Virus by PCR: NEGATIVE
SARS Coronavirus 2 by RT PCR: POSITIVE — AB

## 2022-06-20 LAB — HEPATIC FUNCTION PANEL
ALT: 21 U/L (ref 0–44)
AST: 20 U/L (ref 15–41)
Albumin: 3.2 g/dL — ABNORMAL LOW (ref 3.5–5.0)
Alkaline Phosphatase: 80 U/L (ref 38–126)
Bilirubin, Direct: 0.1 mg/dL (ref 0.0–0.2)
Indirect Bilirubin: 0.5 mg/dL (ref 0.3–0.9)
Total Bilirubin: 0.6 mg/dL (ref 0.3–1.2)
Total Protein: 8.7 g/dL — ABNORMAL HIGH (ref 6.5–8.1)

## 2022-06-20 LAB — TROPONIN I (HIGH SENSITIVITY)
Troponin I (High Sensitivity): 13 ng/L (ref <18)
Troponin I (High Sensitivity): 13 ng/L (ref <18)

## 2022-06-20 LAB — TSH: TSH: 2.438 u[IU]/mL (ref 0.350–4.500)

## 2022-06-20 LAB — LIPASE, BLOOD: Lipase: 42 U/L (ref 11–51)

## 2022-06-20 MED ORDER — HEPARIN SODIUM (PORCINE) 5000 UNIT/ML IJ SOLN
5000.0000 [IU] | Freq: Three times a day (TID) | INTRAMUSCULAR | Status: DC
  Administered 2022-06-20 – 2022-06-23 (×9): 5000 [IU] via SUBCUTANEOUS
  Filled 2022-06-20 (×8): qty 1

## 2022-06-20 MED ORDER — ATORVASTATIN CALCIUM 80 MG PO TABS
80.0000 mg | ORAL_TABLET | Freq: Every day | ORAL | Status: DC
  Administered 2022-06-20 – 2022-06-23 (×4): 80 mg via ORAL
  Filled 2022-06-20 (×4): qty 1

## 2022-06-20 MED ORDER — ALBUTEROL SULFATE (2.5 MG/3ML) 0.083% IN NEBU: 3.0000 mL | INHALATION_SOLUTION | Freq: Four times a day (QID) | RESPIRATORY_TRACT | Status: DC | PRN

## 2022-06-20 MED ORDER — SODIUM CHLORIDE 0.9 % IV BOLUS
1000.0000 mL | Freq: Once | INTRAVENOUS | Status: AC
  Administered 2022-06-20: 1000 mL via INTRAVENOUS

## 2022-06-20 MED ORDER — ACETAMINOPHEN 500 MG PO TABS
1000.0000 mg | ORAL_TABLET | ORAL | Status: AC
  Administered 2022-06-20: 1000 mg via ORAL
  Filled 2022-06-20: qty 2

## 2022-06-20 MED ORDER — SODIUM CHLORIDE 0.9 % IV SOLN: Freq: Once | INTRAVENOUS | Status: AC

## 2022-06-20 MED ORDER — ACETAMINOPHEN 325 MG PO TABS: 650.0000 mg | ORAL_TABLET | Freq: Four times a day (QID) | ORAL | Status: DC | PRN

## 2022-06-20 MED ORDER — ASPIRIN 81 MG PO TBEC
81.0000 mg | DELAYED_RELEASE_TABLET | Freq: Every day | ORAL | Status: DC
  Administered 2022-06-20 – 2022-06-23 (×4): 81 mg via ORAL
  Filled 2022-06-20 (×4): qty 1

## 2022-06-20 MED ORDER — POLYETHYLENE GLYCOL 3350 17 G PO PACK: 17.0000 g | PACK | Freq: Every day | ORAL | Status: DC | PRN

## 2022-06-20 NOTE — Hospital Course (Signed)
[name] is a 89 y.o. person living with a history of hypertension, CKD stage 4, CAD s/p stenting, prior CVA, and hyperparathyroidism who presented with a two week history of generalized fatigue and admitted for COVID-19 infection and an acute-on-chronic kidney injury in the setting of protein-calorie malnutrition  #Protein-calorie malnutrition #Major depressive disorder +/- mild cognitive impairment #Hypoactive delirium On exam patient has decreased muscle bulk throughout with notable atrophy of the thenar eminence and on admission BMI was 18.37  - this is evidence of protein-calorie malnutrition. Patient also endorses depressed mood, anhedonia, and poor sleep. Collateral information reveals poor appetite and decreased motor activity. These symptoms have been present > 2 weeks. He meets criteria for MDD. There is also collateral information suggestive cognitive impairments lasting 1 year which is concerning for mild cognitive impairment. TSH, vitamin D, B12 wnl. During his hospitalization, patient was seen by PT/OT and SLP. He was also seen by a registered dietician. He was provided dietary supplementation and multi-vitamins. On day of discharge, patient orientation is improved, but did present with fluctuating orientation throughout hospitalization characteristic of hypoactive delirium. He will be going to SNF and will require further rehabilitation. He was also started on mirtazepine for MDD.   #AKI on CKD4, like pre-renal in the setting of dehydration Pt with a two week history of lethargy and fatigue. Granddaughter confirms his appetite has decreased and he does not have the same energy.  During hospitalization he received 1L NS bolus and 2L LR bolus. Nephrotoxic medications were held. Baseline creatinine seems to be around 2.5, admission creatinine was 3.27 but continues to downtrend (on day of discharge CK 2.37).   #COVID-19 infection  Patient wast found to be positive for COVID-19 on PCR  testing when entering the emergency department. Unsure timeline on when patient first started feeling symptoms, seems to be around two weeks ago, which put him out of the timeline for Paxlovid.Chest x-ray did not show signs of pneumonia, but did have wet cough on admission and throughout hospital stay. He was afebrile. He did not require any oxygen supplementation.   #Incidental Pulmonary Nodule  Noted on CT scan on previous admission, but present is a 13x54mm ground-glass opacity in the left lower lobe. Adenocarcinoma can not be excluded. Seems to not have grown in size. Will need another CT scan in 12 months in outpatient setting  Chronic Problems CAD s/p Stenting - Continue aspirin  Prior CVA - Continue atorvastatin HTN - held antihypertensives HF - held HF medications Asymptomatic bradycardia - chronic problem

## 2022-06-20 NOTE — H&P (Cosign Needed)
Date: 06/20/2022               Patient Name:  [name] MRN: [id number]  DOB: [birth year] Age / Sex: 89 y.o., male   PCP: Julieanne Manson, MD         Medical Service: Internal Medicine Teaching Service         Attending Physician: Dr. Mayford Knife, Dorene Ar, MD      First Contact: Dr. Olegario Messier, MD Pager [phone]    Second Contact: Dr. Champ Mungo, DO Pager [phone]         After Hours (After 5p/  First Contact Pager: [phone]  weekends / holidays): Second Contact Pager: [phone]   SUBJECTIVE   Chief Complaint: Generalized Fatigue  History of Present Illness:   Mr. [name] is an 89 year old male with past medical history of hypertension, CKD stage 4, CAD s/p stenting, prior CVA, and hyperparathyroidism who presents with a one to two week history of generalized weakness and fatigue. Pt states that he has felt sick over the last few days. He's had a cough, sneezing, and generalized weakness. Pt was not able to provide more details, so call was placed to his granddaughter Ms. Thompson Grayer.   She states that he lives at a retirement home ([facility]), and the doctor at the retirement stated his blood pressure was low. Social worker also rechecked blood pressure and it was also low. Unable to obtain actual numbers, however orthostatics were positive. So granddaughter called EMS who again, rechecked her blood pressure, and was still low. Granddaughter says that he has not been eating well for the last few days either, and has been feeling "off" in terms of activity.   He denies any chest pain, shortness of breath, falls, or dizziness.    ED Course: Pt presented with BP of 108/82. He was given a bolus of normal saline . BMP showed an elevated creatinine of 3.27, GFR as 17, Hb of 10.6. COVID test was positive. Troponin, TSH, Mg, Lipase, and hepatic function panel all negative. Chest X-Ray showed mild atelectasis at the lung bases. CT renal stone study showed minimal right  posterior basilar subsegmental atelectasis, as well as the presence of 13x11 mm ground-glass opacity seen in left lower lobe.   Meds:  Albuterol 139mcg/act inhaler - 2 puffs every 6 hours  Amlodipine   Aspirin   Atorvastatin   Lasix  Hydralazine   Imdur    Lopressor    Past Medical History Hypertension CKD stage 4 CAD s/p stenting Prior CVA Hyperparathyroidism  Past Surgical History:  Procedure Laterality Date   CARDIAC CATHETERIZATION  02/10/2014   a. hx STEMI s/p PCI with BMS to RCA b. 02/12/2014, 3 v disease, largely nonobstructive, moderate disease in diag which is small. Medical therapy.   CHOLECYSTECTOMY  02/06/07   with right hemicolectomy for cecal adenocarcinoma   HEMICOLECTOMY Right 02/06/07   secondary to adenocarcinoma right colon, Dr Madilyn Fireman performed diagnostic colonoscopy   INGUINAL HERNIA REPAIR Right 05/02/2021   Procedure: RIGHT HERNIA REPAIR INGUINAL;  Surgeon: Fritzi Mandes, MD;  Location: [facility] OR;  Service: General;  Laterality: Right;   LEFT HEART CATHETERIZATION WITH CORONARY ANGIOGRAM N/A 02/12/2014   Procedure: LEFT HEART CATHETERIZATION WITH CORONARY ANGIOGRAM;  Surgeon: Kathleene Hazel, MD;  Location: [facility] CATH LAB;  Service: Cardiovascular;  Laterality: N/A;    Social:  Lives With: [facility]irement [facility]  Occupation: Currently retired Support: Granddaughter in the area, nephew in the area, 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 Dependent in all ADLs and IADLs PCP: Dr. Delrae Alfred Substances: Smoking a pack a day since teenager, granddaughter denies any alcohol or other substance use.   Family History:  Remote history of stroke in brother  Allergies: Allergies as of 06/19/2022   (No Known Allergies)    Review of Systems: A complete ROS was negative except as per HPI.   OBJECTIVE:   Physical Exam: Blood pressure (!) 121/56, pulse (!) 45, temperature 97.6 F (36.4 C),  temperature source Oral, resp. rate 16, height 5\' 7"  (1.702 m), weight 62.1 kg, SpO2 95 %.  Constitutional: Cachectic, lethargic male, in no acute distress HENT: normocephalic atraumatic, mucous membranes dry Eyes: conjunctiva non-erythematous Neck: supple Cardiovascular: regular rate and rhythm, no m/r/g Pulmonary/Chest: normal work of breathing on room air, lungs clear to auscultation bilaterally Abdominal: soft, non-tender, non-distended MSK: normal bulk and tone Neurological: alert & oriented to self, location, but not state, 5/5 strength in bilateral upper and lower extremities, normal gait Skin: warm and dry Psych: normal mood and affect  Labs: CBC    Component Value Date/Time   WBC 6.0 06/19/2022 2306   RBC 3.89 (L) 06/19/2022 2306   HGB 10.6 (L) 06/19/2022 2306   HGB 11.2 (L) 05/19/2022 1631   HGB 10.0 (L) 03/11/2007 1445   HCT 34.8 (L) 06/19/2022 2306   HCT 34.4 (L) 05/19/2022 1631   HCT 31.6 (L) 03/11/2007 1445   PLT 255 06/19/2022 2306   PLT 173 05/19/2022 1631   MCV 89.5 06/19/2022 2306   MCV 87 05/19/2022 1631   MCV 75.3 (L) 03/11/2007 1445   MCH 27.2 06/19/2022 2306   MCHC 30.5 06/19/2022 2306   RDW 13.7 06/19/2022 2306   RDW 13.4 05/19/2022 1631   RDW 24.3 (H) 03/11/2007 1445   LYMPHSABS 1.2 06/19/2022 2306   LYMPHSABS 1.4 05/19/2022 1631   LYMPHSABS 1.3 03/11/2007 1445   MONOABS 0.7 06/19/2022 2306   MONOABS 0.4 03/11/2007 1445   EOSABS 0.3 06/19/2022 2306   EOSABS 1.1 (H) 05/19/2022 1631   BASOSABS 0.0 06/19/2022 2306   BASOSABS 0.1 05/19/2022 1631   BASOSABS 0.0 03/11/2007 1445     CMP     Component Value Date/Time   NA 134 (L) 06/19/2022 2306   NA 140 04/15/2021 1138   K 4.4 06/19/2022 2306   CL 100 06/19/2022 2306   CO2 22 06/19/2022 2306   GLUCOSE 101 (H) 06/19/2022 2306   BUN 89 (H) 06/19/2022 2306   BUN 34 (H) 04/15/2021 1138   CREATININE 3.27 (H) 06/19/2022 2306   CALCIUM 10.9 (H) 06/19/2022 2306   PROT 8.7 (H) 06/20/2022 0631    PROT 8.6 (H) 04/15/2021 1138   ALBUMIN 3.2 (L) 06/20/2022 0631   ALBUMIN 4.1 04/15/2021 1138   AST 20 06/20/2022 0631   ALT 21 06/20/2022 0631   ALKPHOS 80 06/20/2022 0631   BILITOT 0.6 06/20/2022 0631   BILITOT 0.6 04/15/2021 1138   GFRNONAA 17 (L) 06/19/2022 2306   GFRAA 27 (L) 02/16/2020 1005    Imaging:   CT Renal Stone Study  Result Date: 06/20/2022 CLINICAL DATA:  Flank pain. EXAM: CT ABDOMEN AND PELVIS WITHOUT CONTRAST TECHNIQUE: Multidetector CT imaging of the abdomen and pelvis was performed following the standard protocol without IV contrast. RADIATION DOSE REDUCTION: This exam was performed according to the departmental dose-optimization program which includes automated exposure control, adjustment of the mA and/or kV according to patient size and/or use of iterative reconstruction  technique. COMPARISON:  March 01, 2022. FINDINGS: Lower chest: Minimal right posterior basilar subsegmental atelectasis. Continued presence of 13 x 11 mm ground-glass opacity seen in left lower lobe. Hepatobiliary: No focal liver abnormality is seen. Status post cholecystectomy. No biliary dilatation. Pancreas: Unremarkable. No pancreatic ductal dilatation or surrounding inflammatory changes. Spleen: Normal in size without focal abnormality. Adrenals/Urinary Tract: Adrenal glands are unremarkable. Kidneys are normal, without renal calculi, focal lesion, or hydronephrosis. Bladder is unremarkable. Stomach/Bowel: The stomach is unremarkable. Status post right hemicolectomy. There is no evidence of bowel obstruction or inflammation. Vascular/Lymphatic: Aortic atherosclerosis. No enlarged abdominal or pelvic lymph nodes. Reproductive: Prostate is unremarkable. Other: No abdominal wall hernia or abnormality. No abdominopelvic ascites. Musculoskeletal: Multilevel degenerative changes are noted in lower lumbar spine. No acute osseous abnormality is noted. IMPRESSION: Minimal right posterior basilar subsegmental  atelectasis. Status post right hemicolectomy. Multilevel degenerative disc disease in lower lumbar spine. No acute abnormality seen in the abdomen or pelvis. Continued presence of 13 x 11 mm ground-glass opacity seen in left lower lobe.Adenocarcinoma cannot be excluded. Follow up by CT is recommended in 12 months, with continued annual surveillance for a minimum of 3 years. These recommendations are taken from: Recommendations for the Management of Subsolid Pulmonary Nodules Detected at CT: A Statement from the Fleischner Society Radiology 2013; 266:1, [phone]. Aortic Atherosclerosis (ICD10-I70.0). Electronically Signed   By: Lupita Raider M.D.   On: 06/20/2022 08:28   DG Chest 2 View  Result Date: 06/19/2022 CLINICAL DATA:  Cough, generalized weakness. EXAM: CHEST - 2 VIEW COMPARISON:  02/28/2022. FINDINGS: The heart size and mediastinal contours are within normal limits. There is atherosclerotic calcification of the aorta. Mild atelectasis is noted at the lung bases. No consolidation, effusion, or pneumothorax. No acute osseous abnormality. Surgical clips are present in the right upper quadrant. IMPRESSION: Mild atelectasis at the lung bases. Electronically Signed   By: Thornell Sartorius M.D.   On: 06/19/2022 23:31      EKG: personally reviewed my interpretation is sinus brady, precordial leads were unattached.   ASSESSMENT & PLAN:   Assessment & Plan by Problem: Principal Problem:   COVID-19   Terreance Valcourt is a 89 y.o. person living with a history of hypertension, CKD stage 4, CAD s/p stenting, prior CVA, and hyperparathyroidism who presented with a two week history of generalized fatigue and admitted for COVID-19 infection and an AKI.  on hospital day 0  #AKI on CKD4, like pre-renal in the setting of Dehydration Pt with a two week history of lethargy and fatigue. Granddaughter confirms his appetite has decreased and he does not have the same energy. Baseline creatinine seems to be around 2.5,  admission creatinine was 3.27. Of note, he was also taking Lasix, so this could have likely caused worsening of his dehydration in the setting of infection. So far he has received only one bolus of normal saline, and is currently on a NaCl infusion at 27mL/hr.   Plan: - Bolus as needed once creatinine returns tomorrow    - Renal studies AM  - Avoid nephrotoxic medications  - Renal ultrasound if creatinine does not correct   #COVID-19 Infection  Pt found to be positive for COVID-19 on PCR testing when entering the emergency department. Thankfully, his lungs sound clear, chest x-ray does not show signs of pneumonia, and vitals are stable. He is not requiring any extra oxygen. Unsure timeline on when patient first started feeling symptoms, seems to be around two weeks ago, which puts him  out of the timeline for Paxlovid. Will monitor and provide with supportive therapy.   Plan:  - Tylenol  for fevers  - Fluids (as noted above)  - Monitoring respiratory status.   #Incidental Pulmonary Nodule  Noted on CT scan on previous admission, but present is a 13x35mm ground-glass opacity in the left lower lobe. Adenocarcinoma can not be excluded. Seems to not have grown in size. Will need another CT scan in 12 months.    #HTN  #HF Pt takes amlodipine ,  Lasix , Hydralazine , Imdur , and Lopressor  presumably for high blood pressure and heart failure. Per grandduaghter, she states he does have a history of heart failure. Last echo available is in 2019, which showed mild LVH, and EF of 55-60%, and grade 1 diastolic dysfunction. I suspect taking all of these medications at once on top of the dehydration from COVID may be contributing to his symptoms.   Plan:  - Hold anti-hypertensive in the setting of concerns for hypotension and AKI.    Chronic Problems CAD s/p Stenting - Continue aspirin  Prior CVA - Continue atorvastatin   Diet: Normal VTE: Enoxaparin IVF:  NS,75cc/hr Code: Full  Prior to Admission Living Arrangement: [facility] Anticipated Discharge Location: [facility] Barriers to Discharge: Medical Management  Dispo: Admit patient to Inpatient with expected length of stay greater than 2 midnights.  Signed: Olegario Messier, MD Internal Medicine Resident PGY-1  06/20/2022, 12:18 PM   Dr. Olegario Messier, MD Pager [phone]

## 2022-06-20 NOTE — ED Provider Notes (Addendum)
[place] [facility]ERGENCY DEPARTMENT AT [facility] Provider Note   CSN: [id number] Arrival date & time: 06/19/22  2247     History  Chief Complaint  Patient presents with   Weakness    [name] is a 89 y.o. male.   Weakness Patient is a an 89 year old male with a history of hypertension, CAD, stroke, CKD, anemia  Patient is an 89 year old male presented emergency room today with complaints of generalized weakness, fatigue, some lower abdominal pain, he states his symptoms have been ongoing for 1 to 2 weeks.  He denies any fevers at home.  He states he coughs occasionally but not significantly more than normal.  Has had some moments of chest pain but no chest pain currently.  Denies any nausea vomiting or diarrhea.  No syncope or near syncope.     Home Medications Prior to Admission medications   Medication Sig Start Date End Date Taking? Authorizing Provider  albuterol (PROAIR HFA) 108 (90 Base) MCG/ACT inhaler Inhale 2 puffs into the lungs every 6 (six) hours as needed for wheezing or shortness of breath. 10/07/21   Julieanne Manson, MD  amLODipine (NORVASC) 10 MG tablet TAKE 1 TABLET BY MOUTH EVERY DAY with evening meal 06/13/22   Julieanne Manson, MD  aspirin EC (GNP ASPIRIN) 81 MG tablet Take 1 tablet (81 mg total) by mouth daily. Swallow whole. 03/06/22   Azucena Fallen, MD  atorvastatin (LIPITOR) 80 MG tablet TAKE 1 TABLET BY MOUTH EVERY EVENING WITH A MEAL Patient taking differently: Take 80 mg by mouth every evening. 02/04/22   Julieanne Manson, MD  furosemide (LASIX) 40 MG tablet TAKE 1 TABLET BY MOUTH EVERY MORNING WITH morning meal 06/13/22   Julieanne Manson, MD  hydrALAZINE (APRESOLINE) 100 MG tablet TAKE 1 TABLET BY MOUTH TWICE DAILY WITH MEALS morning and evening 06/13/22   Julieanne Manson, MD  isosorbide mononitrate (IMDUR) 120 MG 24 hr tablet TAKE 1 TABLET BY MOUTH EVERY EVENING WITH food 06/13/22   Julieanne Manson, MD  loratadine  (ALLERGY RELIEF) 10 MG tablet TAKE 1 TABLET BY MOUTH ONCE DAILY as needed for allergy symptoms Patient taking differently: Take 10 mg by mouth daily as needed for allergies. 06/20/21   Julieanne Manson, MD  metoprolol tartrate (LOPRESSOR) 25 MG tablet TAKE 1 TABLET BY MOUTH TWICE DAILY WITH MEALS morning and evening 06/13/22   Julieanne Manson, MD  nitroGLYCERIN (NITROSTAT) 0.4 MG SL tablet PLACE 1 TABLET UNDER THE TONGUE EVERY 5 MINUTES UP TO 3x AS NEEDED FOR CHEST PAIN, SEEK MEDICAL ATTENTION IF NO RELIEF Patient taking differently: Place 0.4 mg under the tongue every 5 (five) minutes as needed for chest pain. 12/10/21   Julieanne Manson, MD  Spacer/Aero-Holding Chambers (AEROCHAMBER PLUS FLO-VU MEDIUM) MISC 1 each by Other route once. Patient not taking: Reported on 07/17/2021 02/01/15   Leta Baptist, MD      Allergies    Patient has no known allergies.    Review of Systems   Review of Systems  Neurological:  Positive for weakness.    Physical Exam Updated Vital Signs BP 108/82   Pulse (!) 45   Temp 97.6 F (36.4 C) (Oral)   Resp 12   Ht 5\' 7"  (1.702 m)   Wt 62.1 kg   SpO2 99%   BMI 21.46 kg/m  Physical Exam Vitals and nursing note reviewed.  Constitutional:      General: He is not in acute distress.    Comments: Pleasant 89 year old male, cachectic, able  to answer questions appropriately follow commands  HENT:     Head: Normocephalic and atraumatic.     Nose: Nose normal.     Mouth/Throat:     Mouth: Mucous membranes are dry.  Eyes:     General: No scleral icterus. Cardiovascular:     Rate and Rhythm: Normal rate and regular rhythm.     Pulses: Normal pulses.     Heart sounds: Normal heart sounds.  Pulmonary:     Effort: Pulmonary effort is normal. No respiratory distress.     Breath sounds: No wheezing.  Abdominal:     Palpations: Abdomen is soft.     Tenderness: There is no abdominal tenderness. There is no guarding or rebound.     Comments: Cachectic   Musculoskeletal:     Cervical back: Normal range of motion.     Right lower leg: No edema.     Left lower leg: No edema.  Skin:    General: Skin is warm and dry.     Capillary Refill: Capillary refill takes less than 2 seconds.  Neurological:     Mental Status: He is alert. Mental status is at baseline.  Psychiatric:        Mood and Affect: Mood normal.        Behavior: Behavior normal.     ED Results / Procedures / Treatments   Labs (all labs ordered are listed, but only abnormal results are displayed) Labs Reviewed  RESP PANEL BY RT-PCR (RSV, FLU A&B, COVID)  RVPGX2 - Abnormal; Notable for the following components:      Result Value   SARS Coronavirus 2 by RT PCR POSITIVE (*)    All other components within normal limits  BASIC METABOLIC PANEL - Abnormal; Notable for the following components:   Sodium 134 (*)    Glucose, Bld 101 (*)    BUN 89 (*)    Creatinine, Ser 3.27 (*)    Calcium 10.9 (*)    GFR, Estimated 17 (*)    All other components within normal limits  CBC WITH DIFFERENTIAL/PLATELET - Abnormal; Notable for the following components:   RBC 3.89 (*)    Hemoglobin 10.6 (*)    HCT 34.8 (*)    All other components within normal limits  URINALYSIS, ROUTINE W REFLEX MICROSCOPIC  HEPATIC FUNCTION PANEL  LIPASE, BLOOD  MAGNESIUM  TSH  TROPONIN I (HIGH SENSITIVITY)    EKG EKG Interpretation  Date/Time:  Friday June 19 2022 22:57:33 EDT Ventricular Rate:  48 PR Interval:  156 QRS Duration: 76 QT Interval:  440 QTC Calculation: 393 R Axis:   51 Text Interpretation: Sinus bradycardia Nonspecific ST abnormality Abnormal ECG When compared with ECG of 28-Feb-2022 12:53, HEART RATE has decreased Reconfirmed by Dione Booze ([id number]) on 06/20/2022 2:25:54 AM  Radiology DG Chest 2 View  Result Date: 06/19/2022 CLINICAL DATA:  Cough, generalized weakness. EXAM: CHEST - 2 VIEW COMPARISON:  02/28/2022. FINDINGS: The heart size and mediastinal contours are within normal  limits. There is atherosclerotic calcification of the aorta. Mild atelectasis is noted at the lung bases. No consolidation, effusion, or pneumothorax. No acute osseous abnormality. Surgical clips are present in the right upper quadrant. IMPRESSION: Mild atelectasis at the lung bases. Electronically Signed   By: Thornell Sartorius M.D.   On: 06/19/2022 23:31    Procedures Procedures    Medications Ordered in ED Medications  sodium chloride 0.9 % bolus 1,000 mL (has no administration in time range)  acetaminophen (TYLENOL) tablet  1,000 mg (has no administration in time range)  0.9 %  sodium chloride infusion (has no administration in time range)    ED Course/ Medical Decision Making/ A&P                             Medical Decision Making Amount and/or Complexity of Data Reviewed Labs: ordered. Radiology: ordered.  Risk OTC drugs.   This patient presents to the ED for concern of fatigue/weakness, this involves a number of treatment options, and is a complaint that carries with it a moderate to high risk of complications and morbidity. A differential diagnosis was considered for the patient's symptoms which is discussed below:   The differential diagnosis of weakness includes but is not limited to neurologic causes (GBS, myasthenia gravis, CVA, MS, ALS, transverse myelitis, spinal cord injury, CVA, botulism, ) and other causes: ACS, Arrhythmia, syncope, orthostatic hypotension, sepsis, hypoglycemia, electrolyte disturbance, hypothyroidism, respiratory failure, symptomatic anemia, dehydration, heat injury, polypharmacy, malignancy.    Co morbidities: Discussed in HPI   Brief History:  Patient is a an 89 year old male with a history of hypertension, CAD, stroke, CKD, anemia  Patient is an 89 year old male presented emergency room today with complaints of generalized weakness, fatigue, some lower abdominal pain, he states his symptoms have been ongoing for 1 to 2 weeks.  He denies any  fevers at home.  He states he coughs occasionally but not significantly more than normal.  Has had some moments of chest pain but no chest pain currently.  Denies any nausea vomiting or diarrhea.  No syncope or near syncope.    EMR reviewed including pt PMHx, past surgical history and past visits to ER.   See HPI for more details   Lab Tests:   I ordered and independently interpreted labs. Labs notable for Positive COVID-19 test, AKI on CKD with significant elevation in BUN consistent with prerenal source, CBC with hemoglobin not significantly deviated from his baseline.   He does indicate he has some abdominal and chest discomfort did add on LFTs, lipase, troponin, urine sample.  Initiated Tylenol and normal saline   Imaging Studies:  Chest x-ray with a small amount of atelectasis  CT renal stone study ordered and pending   Cardiac Monitoring:  The patient was maintained on a cardiac monitor.  I personally viewed and interpreted the cardiac monitored which showed an underlying rhythm of: bradycardia EKG non-ischemic artifact baseline -- repeat ordered, is sinus   Medicines ordered:  I ordered medication including Tylenol, normal saline for body aches and dehydration Reevaluation of the patient after these medicines showed that the patient meds not given yet. I have reviewed the patients home medicines and have made adjustments as needed   Critical Interventions:     Consults/Attending Physician I discussed this case with my attending physician who cosigned this note including patient's presenting symptoms, physical exam, and planned diagnostics and interventions. Attending physician stated agreement with plan or made changes to plan which were implemented.   Attending physician assessed patient at bedside.    Reevaluation:    Social Determinants of Health:      Problem List / ED Course:  Bradycardia - borderline likely not contributory to patient's  symptoms and fatigue.  Magnesium and TSH ordered COVID-19 likely the etiology of patient's symptoms.  Symptoms have been going on for 1+ weeks however. AKI likely prerenal patient states he has had good intake however seems perhaps slightly unreliable.  Normal saline bolus ordered, Tylenol from some myalgias.  Likely prerenal given elevation in BUN out of proportion to creatinine elevation.  Urinalysis, LFTs troponin TSH mag and lipase all pending.  Did obtain a CT renal stone study given some reports of suprapubic discomfort he is not tender here however a bladder outlet stone could be causing a postrenal obstruction.  Will obtain CT renal stone study   Dispostion:  Patient will require admission after completion of workup.   Final Clinical Impression(s) / ED Diagnoses Final diagnoses:  Dehydration  AKI (acute kidney injury)  COVID-19  Lower abdominal pain  Chest tightness    Rx / DC Orders ED Discharge Orders     None         Gailen Shelter, [place] 06/20/22 0626    Gailen Shelter, PA 06/20/22 1610    Glynn Octave, MD 06/20/22 [phone]
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
## 2022-06-20 NOTE — ED Provider Notes (Signed)
Care assumed from [facility], PA-C at shift change. Please see their note for further information.   Briefly: Patient presents with generalized weakness and fatigue x 1-2 weeks. Intermittent abdominal pain, no chest pain, shortness of breath, or cough worse from baseline.   Plan: patient with AKI --> BUN 89, creatinine 3.27 up from 48 and 2.44 3 months ago. COVID positive, on room air. Is bradycardia in the 40s, appears he was in the 40s intermittently during his previous admission as well. EKG sinus rhythm. TSH and troponin sent and pending. CT renal pending as well to rule out obstruction. Will require admission after these result.  Troponin negative, TSH WNL, CT renal resulted and reveals:  Minimal right posterior basilar subsegmental atelectasis.   Status post right hemicolectomy.   Multilevel degenerative disc disease in lower lumbar spine.   No acute abnormality seen in the abdomen or pelvis.   Continued presence of 13 x 11 mm ground-glass opacity seen in left lower lobe. Adenocarcinoma cannot be excluded. Follow up by CT is recommended in 12 months, with continued annual surveillance for a minimum of 3 years.  I have personally reviewed and interpreted this imaging and agree with radiology interpretation.  Agree with previous providers recommendation for admission for AKI, dehydration, and COVID. Maintenance fluids running. Patient is understanding and in agreement.  Discussed patient with hospitalist who agrees to admit.   Vear Clock 06/20/22 4098    Gwyneth Sprout, MD 06/20/22 1350

## 2022-06-20 NOTE — ED Notes (Signed)
Assumed care of pt brought back from lobby who arrived from home via [facility] c/o 1 week hx of weakness. PT COVID + and asleep when I enter room . Respirations even and non labored fine crackles through out all lung lobes bp low hr low oxygen sats wnl will continue to monitor and treated as ordered

## 2022-06-21 DIAGNOSIS — E46 Unspecified protein-calorie malnutrition: Secondary | ICD-10-CM

## 2022-06-21 LAB — CBC
HCT: 32.6 % — ABNORMAL LOW (ref 39.0–52.0)
Hemoglobin: 10.7 g/dL — ABNORMAL LOW (ref 13.0–17.0)
MCH: 28.5 pg (ref 26.0–34.0)
MCHC: 32.8 g/dL (ref 30.0–36.0)
MCV: 86.9 fL (ref 80.0–100.0)
Platelets: 243 10*3/uL (ref 150–400)
RBC: 3.75 MIL/uL — ABNORMAL LOW (ref 4.22–5.81)
RDW: 13.6 % (ref 11.5–15.5)
WBC: 5.9 10*3/uL (ref 4.0–10.5)
nRBC: 0 % (ref 0.0–0.2)

## 2022-06-21 LAB — BASIC METABOLIC PANEL
Anion gap: 8 (ref 5–15)
BUN: 84 mg/dL — ABNORMAL HIGH (ref 8–23)
CO2: 20 mmol/L — ABNORMAL LOW (ref 22–32)
Calcium: 9.7 mg/dL (ref 8.9–10.3)
Chloride: 105 mmol/L (ref 98–111)
Creatinine, Ser: 3.03 mg/dL — ABNORMAL HIGH (ref 0.61–1.24)
GFR, Estimated: 19 mL/min — ABNORMAL LOW (ref >60)
Glucose, Bld: 85 mg/dL (ref 70–99)
Potassium: 3.9 mmol/L (ref 3.5–5.1)
Sodium: 133 mmol/L — ABNORMAL LOW (ref 135–145)

## 2022-06-21 LAB — VITAMIN B12: Vitamin B-12: 902 pg/mL (ref 180–914)

## 2022-06-21 LAB — VITAMIN D 25 HYDROXY (VIT D DEFICIENCY, FRACTURES): Vit D, 25-Hydroxy: 51.39 ng/mL (ref 30–100)

## 2022-06-21 MED ORDER — ADULT MULTIVITAMIN W/MINERALS CH
1.0000 | ORAL_TABLET | Freq: Every day | ORAL | Status: DC
  Administered 2022-06-21 – 2022-06-23 (×3): 1 via ORAL
  Filled 2022-06-21 (×3): qty 1

## 2022-06-21 MED ORDER — LACTATED RINGERS IV BOLUS
1000.0000 mL | Freq: Once | INTRAVENOUS | Status: AC
  Administered 2022-06-21: 1000 mL via INTRAVENOUS

## 2022-06-21 MED ORDER — ENSURE ENLIVE PO LIQD
237.0000 mL | Freq: Three times a day (TID) | ORAL | Status: DC
  Administered 2022-06-21 – 2022-06-23 (×4): 237 mL via ORAL

## 2022-06-21 NOTE — Progress Notes (Signed)
SATURATION QUALIFICATIONS: (This note is used to comply with regulatory documentation for home oxygen)  Patient Saturations on Room Air at Rest = 97%  Patient Saturations on Room Air while Ambulating = 96%   Please briefly explain why patient needs home oxygen: Pt did not require supplemental O2.   Raymond Gurney, PT, DPT Acute Rehabilitation Services  Office: [phone]

## 2022-06-21 NOTE — Evaluation (Signed)
Physical Therapy Evaluation Patient Details Name: [name] MRN: [id number] DOB: 1930-09-23 Today's Date: 06/21/2022  History of Present Illness  Pt is an 89 y.o. male who presented 06/19/22 with weakness and fatigue. Pt admitted with COVID-19 infections and an AKI. PMH includes HTN, CKD stage 4, cecal colon CA status post right hemicolectomy, CAD s/p stenting, Stroke, hyperparathyroidism.   Clinical Impression  Pt presents with condition above and deficits mentioned below, see PT Problem List. PTA, he was living alone in an ILF home with a level entrance, functioning mod I with a rollator. Pt has an aide that comes for 2.5-3 hours/day x5 days/week (M-F), but the aide is not doing everything she is supposed to be doing. Currently, pt displays deficits in cognition (reportedly close to baseline though), balance, strength, and activity tolerance. Pt was able to perform all functional mobility at a min guard assist level using a RW for support, but is at high risk for falls. Pt could benefit from inpatient rehab, <3 hours/day, and further acute PT services to maximize his safety and independence with functional mobility.  Vitals: 155/62 (88) & 55 bpm supine 155/78 (99) & 66 bpm sitting 129/67 (87) & 65 bpm standing 128/58 (75) & 57 bpm sitting after ambulating  *pt denying lightheadedness throughout  SpO2 >/= 96% on RA     Recommendations for follow up therapy are one component of a multi-disciplinary discharge planning process, led by the attending physician.  Recommendations may be updated based on patient status, additional functional criteria and insurance authorization.  Follow Up Recommendations Can patient physically be transported by private vehicle: Yes     Assistance Recommended at Discharge Frequent or constant Supervision/Assistance  Patient can return home with the following  A little help with walking and/or transfers;A little help with bathing/dressing/bathroom;Assistance  with cooking/housework;Direct supervision/assist for medications management;Direct supervision/assist for financial management;Assist for transportation    Equipment Recommendations None recommended by PT  Recommendations for Other Services       Functional Status Assessment Patient has had a recent decline in their functional status and demonstrates the ability to make significant improvements in function in a reasonable and predictable amount of time.     Precautions / Restrictions Precautions Precautions: Fall;Other (comment) Precaution Comments: watch BP and SpO2 Restrictions Weight Bearing Restrictions: No      Mobility  Bed Mobility Overal bed mobility: Needs Assistance Bed Mobility: Supine to Sit     Supine to sit: Min guard, HOB elevated     General bed mobility comments: Extra time and pt using bed rail with HOB elevated to sit up R EOB, min guard for safety    Transfers Overall transfer level: Needs assistance Equipment used: Rolling walker (2 wheels) Transfers: Sit to/from Stand Sit to Stand: Min guard           General transfer comment: Extra time to power up to stand from EOB 1x and recliner 1x, min guard for safety    Ambulation/Gait Ambulation/Gait assistance: Min guard Gait Distance (Feet): 100 Feet Assistive device: Rolling walker (2 wheels) Gait Pattern/deviations: Step-through pattern, Decreased stride length, Trunk flexed, Knee flexed in stance - left, Knee flexed in stance - right, Narrow base of support Gait velocity: reduced Gait velocity interpretation: <1.31 ft/sec, indicative of household ambulator   General Gait Details: Pt ambulates slowly with a flexed trunk and slight flexion in bil knees with noted shaking in knees but no buckling. Narrow BOS throughout. No overt LOB, min guard for safety  Stairs  Wheelchair Mobility    Modified Rankin (Stroke Patients Only)       Balance Overall balance assessment: Needs  assistance Sitting-balance support: No upper extremity supported, Feet supported Sitting balance-Leahy Scale: Good     Standing balance support: Bilateral upper extremity supported, During functional activity, Reliant on assistive device for balance Standing balance-Leahy Scale: Poor Standing balance comment: Reliant on UE support                             Pertinent Vitals/Pain Pain Assessment Pain Assessment: Faces Faces Pain Scale: No hurt Pain Intervention(s): Monitored during session    Home Living Family/patient expects to be discharged to:: Assisted living (ILF)                 Home Equipment: Rollator (4 wheels);Shower seat;BSC/3in1;Toilet riser Additional Comments: Per daughter, pt resides at an ILF with a level entrance and tub/shower combo with toilet riser on the commode. Pt has an aide that comes 2.5-3 hours/day x5 days/week (M-F), but reportedly the aide is not doing everything she is supposed to be doing so they are looking for a new one    Prior Function Prior Level of Function : Needs assist             Mobility Comments: Mod I using rollator ADLs Comments: Dresses himself and will wash up at sink. Family assists him if he takes a full shower. Aide is supposed to assist with cleaning, cooking, and med management but aide has not been doing her job so they are looking for a new one soon     Hand Dominance        Extremity/Trunk Assessment   Upper Extremity Assessment Upper Extremity Assessment: Defer to OT evaluation    Lower Extremity Assessment Lower Extremity Assessment: Generalized weakness    Cervical / Trunk Assessment Cervical / Trunk Assessment: Kyphotic  Communication   Communication: HOH;Other (comment) (mumbles at times)  Cognition Arousal/Alertness: Awake/alert Behavior During Therapy: WFL for tasks assessed/performed Overall Cognitive Status: History of cognitive impairments - at baseline                                  General Comments: Daughter reports pt has some memory deficits at baseline and reports he is close to his baseline currently. Currently, pt can be verbose and not on topic with his comments. Pt needs redirecting and repetition of cues. But pt is very pleasant. Pt perseverating on 2 employees from yesterday he thought he would see again today        General Comments General comments (skin integrity, edema, etc.): Vitals: 155/62 (88) & 55 bpm supine, 155/78 (99) & 66 bpm sitting, 129/67 (87) & 65 bpm standing, 128/58 (75) & 57 bpm sitting after ambulating; pt denying lightheadedness throughout; SpO2 >/= 96% on RA    Exercises General Exercises - Lower Extremity Hip Flexion/Marching: AROM, Strengthening, Both, 20 reps, Standing (with bil UE support)   Assessment/Plan    PT Assessment Patient needs continued PT services  PT Problem List Decreased strength;Decreased activity tolerance;Decreased mobility;Decreased balance;Decreased range of motion;Decreased cognition       PT Treatment Interventions DME instruction;Gait training;Functional mobility training;Therapeutic activities;Therapeutic exercise;Balance training;Neuromuscular re-education;Cognitive remediation;Patient/family education    PT Goals (Current goals can be found in the Care Plan section)  Acute Rehab PT Goals Patient Stated Goal: agreeable to session PT Goal Formulation:  With patient/family Time For Goal Achievement: 07/05/22 Potential to Achieve Goals: Good    Frequency Min 1X/week     Co-evaluation               AM-PAC PT "6 Clicks" Mobility  Outcome Measure Help needed turning from your back to your side while in a flat bed without using bedrails?: None Help needed moving from lying on your back to sitting on the side of a flat bed without using bedrails?: A Little Help needed moving to and from a bed to a chair (including a wheelchair)?: A Little Help needed standing up from a chair  using your arms (e.g., wheelchair or bedside chair)?: A Little Help needed to walk in hospital room?: A Little Help needed climbing 3-5 steps with a railing? : A Little 6 Click Score: 19    End of Session Equipment Utilized During Treatment: Gait belt Activity Tolerance: Patient tolerated treatment well Patient left: in chair;with call bell/phone within reach;with chair alarm set Nurse Communication: Mobility status;Other (comment) (vitals; NT) PT Visit Diagnosis: Unsteadiness on feet (R26.81);Other abnormalities of gait and mobility (R26.89);Muscle weakness (generalized) (M62.81)    Time: 4270-6237 PT Time Calculation (min) (ACUTE ONLY): 32 min   Charges:   PT Evaluation $PT Eval Moderate Complexity: 1 Mod PT Treatments $Therapeutic Activity: 8-22 mins        Raymond Gurney, PT, DPT Acute Rehabilitation Services  Office: [phone]   Jewel Baize 06/21/2022, 4:41 PM

## 2022-06-21 NOTE — Progress Notes (Signed)
HD#1 SUBJECTIVE:  Patient Summary: [name] is a 89 y.o. person living with a history of hypertension, CKD stage 4, CAD s/p stenting, prior CVA, and hyperparathyroidism who presented with a two week history of generalized fatigue and admitted for COVID-19 infection and an AKI.   Overnight Events: none   Interm History:  4/13 admitted  Today, patient says he feels better compared to yesterday. He denies nausea or vomiting. He says he was probably brought to hospital by niece because he wasn't eating. He endorses feeling being "treated like a dog" at home, but endorses good family social support especially from his granddaughter and son. He denies nausea and vomiting. Denies bloody or dark stools.  I spoke to [name], the patient's granddaughter. She says he has been more weak and not eating in the past week. Wife passed last year, and since then, he has been "on the decline," "looking down a lot," not eating as much. Per [name], a nurse comes by to spend time with patient at home on Mondays through Fridays from 12 noon to 2pm at a retirement apartment. The idea of SNF was reportedly brought up to patient in the past, but he was not open to this due to not wanting his money to be taken from him. [name] thinks going to SNF is a good idea for the patient and prefers him living there rather than his retirement home.  OBJECTIVE:  Vital Signs: Vitals:   06/21/22 0546 06/21/22 0552 06/21/22 0726 06/21/22 1117  BP: 112/73  (!) 165/99 (!) 148/62  Pulse: (!) 53  (!) 55 63  Resp: 15  17 19   Temp: 98.3 F (36.8 C)  (!) 97.4 F (36.3 C) (!) 97.4 F (36.3 C)  TempSrc: Oral  Oral Oral  SpO2: 96%  99% 100%  Weight:  53.2 kg    Height:       SpO2: 100 %  Filed Weights   06/19/22 2253 06/20/22 1705 06/21/22 0552  Weight: 62.1 kg 52.7 kg 53.2 kg    Intake/Output Summary (Last 24 hours) at 06/21/2022 1147 Last data filed at 06/21/2022 4259 Gross per 24 hour  Intake 390 ml  Output 900 ml   Net -510 ml   Net IO Since Admission: -510 mL [06/21/22 1147]  Physical Exam: General: in no acute distress HEENT: normocephalic and atraumatic Respiratory: non-labored breathing, on RA, and diminished breath sounds throughout with some rales on RLL. Wet-sounding cough MSK: moving all extremities spontaneously. Difficulty straightening fingers on bilateral hands. Shortened middle finger distal to DIP on L hand. Notable decreased muscle bulk throughout and thenar muscle wasting Gastrointestinal: non-tender and non-distended Cardiovascular: bradycardic on telemetry. Decreased skin turgor. Neuro: alert, oriented to self, age, and situation. Not oriented to year or place.  Patient Lines/Drains/Airways Status     Active Line/Drains/Airways     Name Placement date Placement time Site Days   Peripheral IV 06/20/22 20 G Posterior;Left Forearm 06/20/22  0618  Forearm  1   Peripheral IV 06/20/22 22 G Right Forearm 06/20/22  2145  Forearm  1   External Urinary Catheter 06/20/22  --  --  1            Pertinent Labs:    Latest Ref Rng & Units 06/21/2022    1:03 AM 06/19/2022   11:06 PM 05/19/2022    4:31 PM  CBC  WBC 4.0 - 10.5 K/uL 5.9  6.0  6.8   Hemoglobin 13.0 - 17.0 g/dL 56.3  87.5  11.2   Hematocrit 39.0 - 52.0 % 32.6  34.8  34.4   Platelets 150 - 400 K/uL 243  255  173        Latest Ref Rng & Units 06/21/2022    2:27 AM 06/20/2022    6:31 AM 06/19/2022   11:06 PM  CMP  Glucose 70 - 99 mg/dL 85   161   BUN 8 - 23 mg/dL 84   89   Creatinine 0.96 - 1.24 mg/dL 0.45   4.09   Sodium 811 - 145 mmol/L 133   134   Potassium 3.5 - 5.1 mmol/L 3.9   4.4   Chloride 98 - 111 mmol/L 105   100   CO2 22 - 32 mmol/L 20   22   Calcium 8.9 - 10.3 mg/dL 9.7   91.4   Total Protein 6.5 - 8.1 g/dL  8.7    Total Bilirubin 0.3 - 1.2 mg/dL  0.6    Alkaline Phos 38 - 126 U/L  80    AST 15 - 41 U/L  20    ALT 0 - 44 U/L  21      No results for input(s): "GLUCAP" in the last 72 hours.    Pertinent Imaging: No results found.  ASSESSMENT/PLAN:  Assessment: Principal Problem:   COVID-19  [name] is a 89 y.o. person living with a history of hypertension, CKD stage 4, CAD s/p stenting, prior CVA, and hyperparathyroidism who presented with a two week history of generalized fatigue and admitted for COVID-19 infection and an AKI  Plan: #Protein-calorie malnutrition #Mild cognitive impairment vs major depressive disorder +/- hypoactive delirium On exam patient has decreased muscle bulk throughout with notable atrophy of the thenar eminence and BMI 18.37 suggesting protein-calorie malnutrition. Patient was reportedly "on the decline" since his wife passed 1 year ago. Since then, he has been "looking down a lot" and not eating much. Over the past week, patient has been more weak and not eating. Also has been "off" in terms of activity. TSH is wnl. These cognitive changes may represent mild cognitive impairment or major depressive disorder. Given patient was oriented only to self and age, there may also be a superimposed hypoactive delirium but this is difficult to confirm at this time without a proper baseline. Patient will likely benefit from outpatient geriatric psychiatry referral. He currently has a nurse come by M-F from 12nn to 2pm, but his living situation may also be better suited to a SNF or other long-term living facility with more assistance rather than living alone at a retirement home. Will also need to work up nutritional deficiencies.  Plan: - TOC consult for SNF vs additional home health support, outpatient psychiatry referral - RD consulted for assessment of nutritional needs - Start ensure supplement TID between meals for calorie supplementation - Start daily multivitamin supplementation - PT/OT, SLP eval and treat - delirium, fall precautions - f/u vitamin D, B12, B1  #AKI on CKD4, like pre-renal in the setting of dehydration Pt with a two week history of  lethargy and fatigue. Granddaughter confirms his appetite has decreased and he does not have the same energy. Baseline creatinine seems to be around 2.5, admission creatinine was 3.27 but today downtrended to 3.03. On evaluation today he continues to have decreased skin turgor. He received 1L NS bolus yesterday and today given another 1L LR bolus  Plan: - Bolus as needed once creatinine returns tomorrow    - Renal studies AM  -  Avoid nephrotoxic medications  - Renal ultrasound if creatinine does not correct   #COVID-19 infection  Pt found to be positive for COVID-19 on PCR testing when entering the emergency department. Chest x-ray did not show signs of pneumonia, but today he does have a wet cough and there's some rales in RLL. Vitals are stable. He is not requiring any extra oxygen. Unsure timeline on when patient first started feeling symptoms, seems to be around two weeks ago, which puts him out of the timeline for Paxlovid. Will monitor and provide with supportive therapy.    Plan:  - Tylenol  for fevers  - Fluids (as noted above)  - Monitoring respiratory status.    #Incidental Pulmonary Nodule  Noted on CT scan on previous admission, but present is a 13x47mm ground-glass opacity in the left lower lobe. Adenocarcinoma can not be excluded. Seems to not have grown in size. Will need another CT scan in 12 months.    #HTN  #HF Pt takes amlodipine ,  Lasix , Hydralazine , Imdur , and Lopressor  presumably for high blood pressure and heart failure. Per grandduaghter, she states he does have a history of heart failure. Last echo available is in 2019, which showed mild LVH, and EF of 55-60%, and grade 1 diastolic dysfunction. I suspect taking all of these medications at once on top of the dehydration from COVID may be contributing to his symptoms.    Plan:  - Hold anti-hypertensive in the setting of concerns for hypotension and AKI.    Chronic Problems CAD s/p  Stenting - Continue aspirin  Prior CVA - Continue atorvastatin Asymptomatic bradycardia - chronic problem  Best Practice: Diet: Normal VTE: Enoxaparin IVF: none Code: Full   Prior to Admission Living Arrangement: [facility] Anticipated Discharge Location: [facility] Barriers to Discharge: Medical Management  Signature: Bary Leriche, MD [facility] Physician 06/21/2022 11:47 AM  Please contact the on call pager after 5 pm and on weekends at [phone].

## 2022-06-22 ENCOUNTER — Encounter: Payer: Self-pay | Admitting: Internal Medicine

## 2022-06-22 ENCOUNTER — Other Ambulatory Visit: Payer: Self-pay | Admitting: Internal Medicine

## 2022-06-22 DIAGNOSIS — I130 Hypertensive heart and chronic kidney disease with heart failure and stage 1 through stage 4 chronic kidney disease, or unspecified chronic kidney disease: Secondary | ICD-10-CM

## 2022-06-22 DIAGNOSIS — I509 Heart failure, unspecified: Secondary | ICD-10-CM

## 2022-06-22 DIAGNOSIS — U071 COVID-19: Secondary | ICD-10-CM | POA: Diagnosis not present

## 2022-06-22 DIAGNOSIS — E441 Mild protein-calorie malnutrition: Secondary | ICD-10-CM

## 2022-06-22 DIAGNOSIS — N184 Chronic kidney disease, stage 4 (severe): Secondary | ICD-10-CM

## 2022-06-22 DIAGNOSIS — Z8673 Personal history of transient ischemic attack (TIA), and cerebral infarction without residual deficits: Secondary | ICD-10-CM

## 2022-06-22 DIAGNOSIS — R911 Solitary pulmonary nodule: Secondary | ICD-10-CM

## 2022-06-22 DIAGNOSIS — I2510 Atherosclerotic heart disease of native coronary artery without angina pectoris: Secondary | ICD-10-CM

## 2022-06-22 DIAGNOSIS — N179 Acute kidney failure, unspecified: Secondary | ICD-10-CM

## 2022-06-22 LAB — BASIC METABOLIC PANEL
Anion gap: 8 (ref 5–15)
BUN: 78 mg/dL — ABNORMAL HIGH (ref 8–23)
CO2: 21 mmol/L — ABNORMAL LOW (ref 22–32)
Calcium: 9.9 mg/dL (ref 8.9–10.3)
Chloride: 108 mmol/L (ref 98–111)
Creatinine, Ser: 2.92 mg/dL — ABNORMAL HIGH (ref 0.61–1.24)
GFR, Estimated: 20 mL/min — ABNORMAL LOW (ref >60)
Glucose, Bld: 130 mg/dL — ABNORMAL HIGH (ref 70–99)
Potassium: 4.1 mmol/L (ref 3.5–5.1)
Sodium: 137 mmol/L (ref 135–145)

## 2022-06-22 LAB — CBC
HCT: 29.7 % — ABNORMAL LOW (ref 39.0–52.0)
Hemoglobin: 9.4 g/dL — ABNORMAL LOW (ref 13.0–17.0)
MCH: 28 pg (ref 26.0–34.0)
MCHC: 31.6 g/dL (ref 30.0–36.0)
MCV: 88.4 fL (ref 80.0–100.0)
Platelets: 203 10*3/uL (ref 150–400)
RBC: 3.36 MIL/uL — ABNORMAL LOW (ref 4.22–5.81)
RDW: 13.5 % (ref 11.5–15.5)
WBC: 4.7 10*3/uL (ref 4.0–10.5)
nRBC: 0 % (ref 0.0–0.2)

## 2022-06-22 MED ORDER — LACTATED RINGERS IV BOLUS
1000.0000 mL | Freq: Once | INTRAVENOUS | Status: AC
  Administered 2022-06-22: 1000 mL via INTRAVENOUS

## 2022-06-22 NOTE — Progress Notes (Signed)
HD#2 SUBJECTIVE:  Patient Summary: [name] is a 89 y.o. person living with a history of hypertension, CKD stage 4, CAD s/p stenting, prior CVA, and hyperparathyroidism who presented with a two week history of generalized fatigue and admitted for COVID-19 infection and an AKI.   Overnight Events: none   Interm History:  4/13 admitted  Today, patient was somnolent but rouses to voice. He spoke in a low volume. He tells me his name and says he is in [place], but does not know where he is or why he is the hospital. He was amenable to going to SNF.  OBJECTIVE:  Vital Signs: Vitals:   06/21/22 2205 06/21/22 2338 06/22/22 0351 06/22/22 0352  BP:  131/80 139/61   Pulse:  (!) 58 (!) 48 (!) 51  Resp:  17 12 14   Temp:  97.7 F (36.5 C) 98.4 F (36.9 C)   TempSrc:  Oral Oral   SpO2: 95% 100% 99%   Weight:    54.5 kg  Height:       SpO2: 99 %  Filed Weights   06/20/22 1705 06/21/22 0552 06/22/22 0352  Weight: 52.7 kg 53.2 kg 54.5 kg    Intake/Output Summary (Last 24 hours) at 06/22/2022 0703 Last data filed at 06/22/2022 0353 Gross per 24 hour  Intake --  Output 800 ml  Net -800 ml   Net IO Since Admission: -1,310 mL [06/22/22 0703]  Physical Exam: General: in no acute distress HEENT: normocephalic and atraumatic Respiratory: non-labored breathing, on RA, and with end-expiratory wheezing MSK: moving all extremities spontaneously. Notable decreased muscle bulk throughout and thenar muscle wasting Gastrointestinal: non-tender and non-distended Cardiovascular: bradycardic on telemetry. Decreased skin turgor but improved from yesterday Neuro: somnolent, oriented to self and city. Not oriented to year or situation. Does not know he is in hospital.  Patient Lines/Drains/Airways Status     Active Line/Drains/Airways     Name Placement date Placement time Site Days   Peripheral IV 06/20/22 20 G Posterior;Left Forearm 06/20/22  0618  Forearm  1   Peripheral IV 06/20/22 22  G Right Forearm 06/20/22  2145  Forearm  1   External Urinary Catheter 06/20/22  --  --  1            Pertinent Labs:    Latest Ref Rng & Units 06/22/2022    1:25 AM 06/21/2022    1:03 AM 06/19/2022   11:06 PM  CBC  WBC 4.0 - 10.5 K/uL 4.7  5.9  6.0   Hemoglobin 13.0 - 17.0 g/dL 9.4  82.7  07.8   Hematocrit 39.0 - 52.0 % 29.7  32.6  34.8   Platelets 150 - 400 K/uL 203  243  255        Latest Ref Rng & Units 06/22/2022    1:25 AM 06/21/2022    2:27 AM 06/20/2022    6:31 AM  CMP  Glucose 70 - 99 mg/dL 675  85    BUN 8 - 23 mg/dL 78  84    Creatinine 4.49 - 1.24 mg/dL 2.01  0.07    Sodium 121 - 145 mmol/L 137  133    Potassium 3.5 - 5.1 mmol/L 4.1  3.9    Chloride 98 - 111 mmol/L 108  105    CO2 22 - 32 mmol/L 21  20    Calcium 8.9 - 10.3 mg/dL 9.9  9.7    Total Protein 6.5 - 8.1 g/dL   8.7   Total  Bilirubin 0.3 - 1.2 mg/dL   0.6   Alkaline Phos 38 - 126 U/L   80   AST 15 - 41 U/L   20   ALT 0 - 44 U/L   21     No results for input(s): "GLUCAP" in the last 72 hours.   Pertinent Imaging: No results found.  ASSESSMENT/PLAN:  Assessment: Principal Problem:   COVID-19  [name] is a 89 y.o. person living with a history of hypertension, CKD stage 4, CAD s/p stenting, prior CVA, and hyperparathyroidism who presented with a two week history of generalized fatigue and admitted for COVID-19 infection and an AKI  Plan: #Protein-calorie malnutrition #Mild cognitive impairment vs major depressive disorder #Hypoactive delirium On exam patient has decreased muscle bulk throughout with notable atrophy of the thenar eminence and BMI today 18.82 - this is evidence of protein-calorie malnutrition. Today patient continues to be disoriented, suggesting there is a delirium component superimposed on his MCI vs MDD. TSH, vitamin D, B12 wnl.  Plan: - TOC consult for SNF vs additional home health support, outpatient psychiatry referral - RD consulted for assessment of nutritional  needs - continue ensure supplement TID between meals for calorie supplementation - continue daily multivitamin supplementation - PT/OT, SLP eval and treat - delirium, fall precautions - f/u vitamin B1  #AKI on CKD4, like pre-renal in the setting of dehydration Pt with a two week history of lethargy and fatigue. Granddaughter confirms his appetite has decreased and he does not have the same energy. Baseline creatinine seems to be around 2.5, admission creatinine was 3.27 but continues to downtrend (today 2.92). On evaluation today he continues to have decreased skin turgor. He has since received 1L NS bolus, 1L LR bolus, and today given another 1L LR bolus  Plan: - Bolus as needed once creatinine returns tomorrow    - Renal studies AM  - Avoid nephrotoxic medications  - Renal ultrasound if creatinine does not correct   #COVID-19 infection  Pt found to be positive for COVID-19 on PCR testing when entering the emergency department. Chest x-ray did not show signs of pneumonia, but today he does have a wet cough and there's some rales in RLL. Vitals are stable. He is not requiring any extra oxygen. Unsure timeline on when patient first started feeling symptoms, seems to be around two weeks ago, which puts him out of the timeline for Paxlovid. Will monitor and provide with supportive therapy.    Plan:  - Tylenol 1000mg  for fevers  - Fluids (as noted above)  - Monitoring respiratory status.    #Incidental Pulmonary Nodule  Noted on CT scan on previous admission, but present is a 13x73mm ground-glass opacity in the left lower lobe. Adenocarcinoma can not be excluded. Seems to not have grown in size. Will need another CT scan in 12 months.    #HTN  #HF Pt takes amlodipine 10mg ,  Lasix 40mg , Hydralazine 100mg , Imdur 120mg , and Lopressor 25mg  presumably for high blood pressure and heart failure. Per grandduaghter, she states he does have a history of heart failure. Last echo available is in 2019,  which showed mild LVH, and EF of 55-60%, and grade 1 diastolic dysfunction. I suspect taking all of these medications at once on top of the dehydration from COVID may be contributing to his symptoms.    Plan:  - Hold anti-hypertensive in the setting of concerns for hypotension and AKI.    Chronic Problems CAD s/p Stenting - Continue aspirin  Prior CVA -  Continue atorvastatin Asymptomatic bradycardia - chronic problem  Best Practice: Diet: Normal VTE: Enoxaparin IVF: none Code: Full   Prior to Admission Living Arrangement: [facility] Anticipated Discharge Location: [facility] Barriers to Discharge: Medical Management  Signature: Bary Leriche, MD [facility] Physician 06/22/2022 7:03 AM  Please contact the on call pager after 5 pm and on weekends at [phone].

## 2022-06-22 NOTE — [place]2 (Signed)
[place] MEDICAID [id number] LEVEL OF CARE FORM     IDENTIFICATION  Patient Name: [name] Birthdate: Oct 13, 1930 Sex: male Admission Date (Current Location): 06/19/2022  [facility] and [place] Number:  Producer, television/film/video and Address:  [place]. [facility], [address] [place], [place] [address]      Provider Number: [id number]  Attending Physician Name and Address:  Ginnie Smart, MD  Relative Name and Phone Number:  [name] [phone]    Current Level of Care: Hospital Recommended Level of Care: Skilled Nursing Facility Prior Approval Number:    Date Approved/Denied:   PASRR Number: [id number]  Discharge Plan: SNF    Current Diagnoses: Patient Active Problem List   Diagnosis Date Noted   Mild protein-calorie malnutrition 06/22/2022   COVID-19 06/20/2022   Acute kidney injury 03/01/2022   Generalized weakness 03/01/2022   Right knee pain 03/01/2022   Pulmonary nodule 03/01/2022   Hypercalcemia 03/01/2022   History of CVA (cerebrovascular accident) 03/01/2022   Incarcerated inguinal hernia 05/02/2021   Incarcerated right inguinal hernia 05/02/2021   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    Right inguinal hernia 11/29/2019   Decreased hearing, bilateral 11/29/2019   CKD stage 3b, GFR 30-44 ml/min 08/29/2019   Hyperparathyroidism 06/20/2019   Atypical chest pain 01/20/2018   Right carotid bruit 07/07/2017   Tobacco abuse 05/26/2017   Chest pain 04/27/2016   Unstable angina 04/26/2016   Cerebellar ataxia 10/02/2015   Ataxic gait 09/18/2015   History of prostate cancer 09/18/2015   Prediabetes 07/29/2015   CKD (chronic kidney disease), stage IV 04/19/2015   Chronic diastolic HF (heart failure) (HCC)    COPD (chronic obstructive pulmonary disease) 02/10/2014   Coronary artery disease 01/25/2013   Essential hypertension 01/25/2013   Mixed hyperlipidemia 01/25/2013   History of colon cancer 05/19/2006   Kidney stones 1954     Orientation RESPIRATION BLADDER Height & Weight     Self  Normal Incontinent, External catheter Weight: 120 lb 2.4 oz (54.5 kg) Height:   (170.2 cm)  BEHAVIORAL SYMPTOMS/MOOD NEUROLOGICAL BOWEL NUTRITION STATUS      Continent Diet (See dc summary)  AMBULATORY STATUS COMMUNICATION OF NEEDS Skin   Limited Assist Verbally Normal                       Personal Care Assistance Level of Assistance  Bathing, Feeding, Dressing Bathing Assistance: Limited assistance Feeding assistance: Independent Dressing Assistance: Limited assistance     Functional Limitations Info  Sight, Hearing, Speech Sight Info: Adequate Hearing Info: Adequate Speech Info: Adequate    SPECIAL CARE FACTORS FREQUENCY  PT (By licensed PT), OT (By licensed OT)     PT Frequency: 5xweek OT Frequency: 5xweek            Contractures Contractures Info: Not present    Additional Factors Info  Code Status Code Status Info: Full             Current Medications (06/22/2022):  This is the current hospital active medication list Current Facility-Administered Medications  Medication Dose Route Frequency Provider Last Rate Last Admin   acetaminophen (TYLENOL) tablet 650 mg  650 mg Oral Q6H PRN Champ Mungo, DO       albuterol (PROVENTIL) (2.5 MG/3ML) 0.083% nebulizer solution 3 mL  3 mL Inhalation Q6H PRN Champ Mungo, DO       aspirin EC tablet 81 mg  81 mg Oral Daily Champ Mungo, DO   81  mg at 06/22/22 0956   atorvastatin (LIPITOR) tablet 80 mg  80 mg Oral Daily Champ Mungo, DO   80 mg at 06/22/22 0957   feeding supplement (ENSURE ENLIVE / ENSURE PLUS) liquid 237 mL  237 mL Oral TID BM Augusto Gamble, MD   237 mL at 06/21/22 2212   heparin injection 5,000 Units  5,000 Units Subcutaneous Q8H Champ Mungo, DO   5,000 Units at 06/22/22 0601   multivitamin with minerals tablet 1 tablet  1 tablet Oral Daily Augusto Gamble, MD   1 tablet at 06/22/22 0957   polyethylene glycol (MIRALAX / GLYCOLAX) packet 17 g   17 g Oral Daily PRN Champ Mungo, DO         Discharge Medications: Please see discharge summary for a list of discharge medications.  Relevant Imaging Results:  Relevant Lab Results:   Additional Information SSN: [id number]  Oletta Lamas, MSW, [facility] Transitions of Care  Clinical Social Worker I
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
## 2022-06-22 NOTE — Evaluation (Addendum)
Occupational Therapy Evaluation Patient Details Name: [name] MRN: [id number] DOB: 1931-03-07 Today's Date: 06/22/2022   History of Present Illness Pt is an 89 y.o. male who presented 06/19/22 with weakness and fatigue. Pt admitted with COVID-19 infections and an AKI. PMH includes HTN, CKD stage 4, cecal colon CA status post right hemicolectomy, CAD s/p stenting, Stroke, hyperparathyroidism.   Clinical Impression   Pt admitted for above dx, PTA patient resided at an ILF with intermittent support from aide, ambulating with Mod I using rollator, and Pt complete dressing independently but family assists with bathing/showering. Pt currently presents with generalized weakness and impaired activity tolerance and appears fixated on trying to rest. Pt completing room level ambulation with Min guard assist, but demo's poor RW management as patient bumps RW in to walls around the room, OT will further assess vision as able. Pt would benefit from continued acute skilled OT services to address above deficits and help transition to next level of care. Patient would benefit from post acute skilled rehab facility with >3 hours of therapy and 24/7 support. Pt would be functionally appropriate to DC but needs increased consistent home support and assist d/t impaired balance and decreased safety when ambulating.      Recommendations for follow up therapy are one component of a multi-disciplinary discharge planning process, led by the attending physician.  Recommendations may be updated based on patient status, additional functional criteria and insurance authorization.   Assistance Recommended at Discharge Frequent or constant Supervision/Assistance  Patient can return home with the following A little help with walking and/or transfers;Assistance with cooking/housework;Direct supervision/assist for medications management;Direct supervision/assist for financial management;Help with stairs or ramp for entrance;Assist  for transportation    Functional Status Assessment  Patient has had a recent decline in their functional status and demonstrates the ability to make significant improvements in function in a reasonable and predictable amount of time.  Equipment Recommendations  Other (comment) (Defer to next level of care)    Recommendations for Other Services       Precautions / Restrictions Precautions Precautions: Fall;Other (comment) Precaution Comments: watch BP and SpO2 Restrictions Weight Bearing Restrictions: No      Mobility Bed Mobility Overal bed mobility: Needs Assistance Bed Mobility: Supine to Sit, Sit to Supine     Supine to sit: Supervision, HOB elevated Sit to supine: Supervision        Transfers Overall transfer level: Needs assistance Equipment used: Rolling walker (2 wheels) Transfers: Sit to/from Stand Sit to Stand: Min guard           General transfer comment: Increased time needed for STS from toilet      Balance Overall balance assessment: Needs assistance Sitting-balance support: No upper extremity supported, Feet supported Sitting balance-Leahy Scale: Good     Standing balance support: Bilateral upper extremity supported, During functional activity, Reliant on assistive device for balance Standing balance-Leahy Scale: Poor Standing balance comment: RW                           ADL either performed or assessed with clinical judgement   ADL Overall ADL's : Needs assistance/impaired Eating/Feeding: Supervision/ safety;Set up;Sitting   Grooming: Min guard;Standing   Upper Body Bathing: Sitting;Supervision/ safety   Lower Body Bathing: Min guard;Sitting/lateral leans   Upper Body Dressing : Sitting;Min guard   Lower Body Dressing: Supervision/safety;Sitting/lateral leans Lower Body Dressing Details (indicate cue type and reason): Pt doffed/donned bilat socks sitting EOB Toilet Transfer:  Min guard;Ambulation;Rolling walker (2  wheels)   Toileting- Clothing Manipulation and Hygiene: Min guard;Sitting/lateral lean   Tub/ Shower Transfer: Min guard;Ambulation   Functional mobility during ADLs: Min guard;Rolling walker (2 wheels)       Vision   Additional Comments: further visual testing should be done. Pt bumping RW into things on R and L side. Pt seemed very fatigued so deferred vision assessment on IE     Perception     Praxis      Pertinent Vitals/Pain Pain Assessment Pain Assessment: No/denies pain     Hand Dominance Right   Extremity/Trunk Assessment Upper Extremity Assessment Upper Extremity Assessment: Generalized weakness;RUE deficits/detail RUE Deficits / Details: RUE presented with hand atrophy, fingers rest in flexion, weak gross grip strength. Baseline for Pt   Lower Extremity Assessment Lower Extremity Assessment: Generalized weakness   Cervical / Trunk Assessment Cervical / Trunk Assessment: Kyphotic   Communication Communication Communication: HOH;Other (comment) (Pt mumbles)   Cognition Arousal/Alertness: Awake/alert Behavior During Therapy: WFL for tasks assessed/performed Overall Cognitive Status: History of cognitive impairments - at baseline                                 General Comments: Per PT nore, daughter reports having memory deficits at baseline. Cueing needed to encourage patient to speak up when engaging in conversation     General Comments  Sp02 at 96% on RA, HR fluctuating in the 70s with functional ambulation, BP 153/63 at end of OT session Pt denies any sensations fo lightheaded and dizziness    Exercises     Shoulder Instructions      Home Living Family/patient expects to be discharged to:: Assisted living (ILF)                             Home Equipment: Rollator (4 wheels);Shower seat;BSC/3in1;Toilet riser   Additional Comments: Per daughter, pt resides at an ILF with a level entrance and tub/shower combo with toilet  riser on the commode. Pt has an aide that comes 2.5-3 hours/day x5 days/week (M-F), but reportedly the aide is not doing everything she is supposed to be doing so they are looking for a new one. Home setup from PT note as patient mumbles and is difficulty to make out speech      Prior Functioning/Environment Prior Level of Function : Needs assist             Mobility Comments: Mod I using rollator ADLs Comments: Dresses himself and will wash up at sink. Family assists him if he takes a full shower. Aide is supposed to assist with cleaning, cooking, and med management but aide has not been doing her job so they are looking for a new one soon        OT Problem List: Decreased strength;Decreased safety awareness;Decreased activity tolerance;Impaired balance (sitting and/or standing)      OT Treatment/Interventions: Self-care/ADL training;Therapeutic exercise;Therapeutic activities;DME and/or AE instruction;Balance training;Patient/family education;Energy conservation    OT Goals(Current goals can be found in the care plan section) Acute Rehab OT Goals Patient Stated Goal: To go back home OT Goal Formulation: With patient Time For Goal Achievement: 07/06/22 Potential to Achieve Goals: Fair ADL Goals Pt Will Perform Grooming: standing;with supervision Pt Will Transfer to Toilet: with supervision;ambulating Additional ADL Goal #2: Pt will complete further screening for vision assessment  OT Frequency: Min 2X/week  Co-evaluation              AM-PAC OT "6 Clicks" Daily Activity     Outcome Measure Help from another person eating meals?: A Little Help from another person taking care of personal grooming?: A Little Help from another person toileting, which includes using toliet, bedpan, or urinal?: A Little Help from another person bathing (including washing, rinsing, drying)?: A Little Help from another person to put on and taking off regular upper body clothing?: A  Little Help from another person to put on and taking off regular lower body clothing?: A Little 6 Click Score: 18   End of Session Equipment Utilized During Treatment: Gait belt;Rolling walker (2 wheels) Nurse Communication: Mobility status  Activity Tolerance: Patient limited by fatigue Patient left: in bed;with call bell/phone within reach;with bed alarm set  OT Visit Diagnosis: Other abnormalities of gait and mobility (R26.89);Unsteadiness on feet (R26.81);Muscle weakness (generalized) (M62.81)                Time: 3818-2993 OT Time Calculation (min): 22 min Charges:  OT General Charges $OT Visit: 1 Visit OT Evaluation $OT Eval Moderate Complexity: 1 Mod  06/22/2022  AB, OTR/L  Acute Rehabilitation Services  Office: [phone]   Tristan Schroeder 06/22/2022, 12:33 PM

## 2022-06-22 NOTE — TOC Initial Note (Signed)
Transition of Care [facility]) - Initial/Assessment Note    Patient Details  Name: [name] MRN: [id number] Date of Birth: 1930-07-06  Transition of Care [facility]) CM/SW Contact:    Glen Rams, LCSW Phone Number: 06/22/2022, 11:17 AM  Clinical Narrative:                 CSW spoke with pt granddaughter Glen via phone. CSW explained the recommendation for STR at SNF. Elroy [name] reported that pt has gone to previously gone to STR and they're agreeable to go to SNF.   CSW completed fl2 and faxed out. Family preference is [facility]. TOC will continue to follow this admission.   Expected Discharge Plan: Skilled Nursing Facility Barriers to Discharge: Continued Medical Work up   Patient Goals and CMS Choice            Expected Discharge Plan and Services       Living arrangements for the past 2 months: Independent Living Facility                                      Prior Living Arrangements/Services Living arrangements for the past 2 months: Independent Living Facility Lives with:: Self          Need for Family Participation in Patient Care: Yes (Comment) Care giver support system in place?: Yes (comment) Current home services: DME Criminal Activity/Legal Involvement Pertinent to Current Situation/Hospitalization: No - Comment as needed  Activities of Daily Living Home Assistive Devices/Equipment: Other (Comment) ADL Screening (condition at time of admission) Patient's cognitive ability adequate to safely complete daily activities?: No Is the patient deaf or have difficulty hearing?: No Does the patient have difficulty seeing, even when wearing glasses/contacts?: No Does the patient have difficulty concentrating, remembering, or making decisions?: No Patient able to express need for assistance with ADLs?: No Does the patient have difficulty dressing or bathing?: No Independently performs ADLs?: No Communication: Needs assistance Is this a change from baseline?:  Change from baseline, expected to last >3 days Dressing (OT): Needs assistance Is this a change from baseline?: Change from baseline, expected to last <3days Grooming: Needs assistance Is this a change from baseline?: Pre-admission baseline Feeding: Needs assistance Is this a change from baseline?: Pre-admission baseline Bathing: Needs assistance Is this a change from baseline?: Pre-admission baseline Toileting: Needs assistance Is this a change from baseline?: Change from baseline, expected to last <3 days In/Out Bed: Needs assistance Is this a change from baseline?: Change from baseline, expected to last <3 days Walks in Home: Needs assistance Is this a change from baseline?: Change from baseline, expected to last <3 days Does the patient have difficulty walking or climbing stairs?: Yes Weakness of Legs: Both Weakness of Arms/Hands: None  Permission Sought/Granted                  Emotional Assessment   Attitude/Demeanor/Rapport: Unable to Assess Affect (typically observed): Unable to Assess Orientation: : Oriented to Self Alcohol / Substance Use: Not Applicable Psych Involvement: No (comment)  Admission diagnosis:  Dehydration [E86.0] Bradycardia [R00.1] Chest tightness [R07.89] Lower abdominal pain [R10.30] AKI (acute kidney injury) [N17.9] COVID-19 [U07.1] Patient Active Problem List   Diagnosis Date Noted   Mild protein-calorie malnutrition 06/22/2022   COVID-19 06/20/2022   Acute kidney injury 03/01/2022   Generalized weakness 03/01/2022   Right knee pain 03/01/2022   Pulmonary nodule 03/01/2022   Hypercalcemia 03/01/2022  History of CVA (cerebrovascular accident) 03/01/2022   Incarcerated inguinal hernia 05/02/2021   Incarcerated right inguinal hernia 05/02/2021   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    Right inguinal hernia 11/29/2019   Decreased hearing, bilateral 11/29/2019   CKD stage 3b, GFR 30-44 ml/min 08/29/2019   Hyperparathyroidism  06/20/2019   Atypical chest pain 01/20/2018   Right carotid bruit 07/07/2017   Tobacco abuse 05/26/2017   Chest pain 04/27/2016   Unstable angina 04/26/2016   Cerebellar ataxia 10/02/2015   Ataxic gait 09/18/2015   History of prostate cancer 09/18/2015   Prediabetes 07/29/2015   CKD (chronic kidney disease), stage IV 04/19/2015   Chronic diastolic HF (heart failure) (HCC)    COPD (chronic obstructive pulmonary disease) 02/10/2014   Coronary artery disease 01/25/2013    Class: Chronic   Essential hypertension 01/25/2013    Class: Chronic   Mixed hyperlipidemia 01/25/2013    Class: Chronic   History of colon cancer 05/19/2006   Kidney stones 1954   PCP:  Glen Manson, MD Pharmacy:   [facility] [place], [place] - [address] [place] [address] Phone: [phone] Fax: [phone]  [facility] [address] [place] [address] Phone: [phone] Fax: [phone]     Social Determinants of Health (SDOH) Social History: SDOH Screenings   Food Insecurity: Patient Unable To Answer (06/20/2022)  Housing: Low Risk  (06/20/2022)  Transportation Needs: No Transportation Needs (06/20/2022)  Utilities: Not At Risk (06/20/2022)  Tobacco Use: High Risk (06/22/2022)   SDOH Interventions:     Readmission Risk Interventions     No data to display          [name], MSW, LCSWA, LCASA Transitions of Care  Clinical Social Worker I

## 2022-06-22 NOTE — Progress Notes (Signed)
Initial Nutrition Assessment  DOCUMENTATION CODES:   Severe malnutrition in context of chronic illness  INTERVENTION:   Liberalize diet to REGULAR  Continue MVI with Minerals  Ensure Enlive po TID, each supplement provides 350 kcal and 20 grams of protein.   NUTRITION DIAGNOSIS:   Severe Malnutrition related to chronic illness as evidenced by severe muscle depletion, severe fat depletion.  GOAL:   Patient will meet greater than or equal to 90% of their needs   MONITOR:   PO intake, Supplement acceptance, Labs, Weight trends  REASON FOR ASSESSMENT:   Consult, Malnutrition Screening Tool Assessment of nutrition requirement/status, Poor PO  ASSESSMENT:   89 yo male admitted following 2 week hisory of generalized fatigue with AKI on CKD 4 (likely pre-renal from dehydration). Tested +COVID-19 in ED. PMH includes CKD 4, hyperparathyroidism, CAD s/p stenting, prior CVA  Pt lethargic but arousable, closes eyes most of assessment. Pt answers somes questions mostly just saying "yeah" but question accuracy. No family at bedside.   No recorded po intake; spoke with RN who indicates pt with minimal intake today.   +wt loss per weight encounters; current wt 54.5 kg wt 52.7 kg on 4/13  Labs: Creatinine 2.92, BUN 78 Meds: MVI with Minerals   NUTRITION - FOCUSED PHYSICAL EXAM:  Flowsheet Row Most Recent Value  Orbital Region Severe depletion  Upper Arm Region Severe depletion  Thoracic and Lumbar Region Severe depletion  Buccal Region Severe depletion  Temple Region Severe depletion  Clavicle Bone Region Severe depletion  Clavicle and Acromion Bone Region Severe depletion  Scapular Bone Region Severe depletion  Dorsal Hand Severe depletion  Patellar Region Severe depletion  Anterior Thigh Region Severe depletion  Posterior Calf Region Severe depletion  Edema (RD Assessment) None       Diet Order:   Diet Order             Diet Heart Room service appropriate? Yes;  Fluid consistency: Thin  Diet effective now                   EDUCATION NEEDS:   Not appropriate for education at this time  Skin:  Skin Assessment: Reviewed RN Assessment  Last BM:  4/14  Height:   Ht Readings from Last 1 Encounters:  06/20/22 5\' 7"  (1.702 m)    Weight:   Wt Readings from Last 1 Encounters:  06/22/22 54.5 kg     BMI:  Body mass index is 18.82 kg/m.  Estimated Nutritional Needs:   Kcal:  1750-1950 kcals  Protein:  80-90 g  Fluid:  >/= 1.8 L    Romelle Starcher MS, RDN, LDN, CNSC Registered Dietitian 3 Clinical Nutrition RD Pager and On-Call Pager Number Located in [place]

## 2022-06-22 NOTE — Progress Notes (Signed)
Subjective:    Patient ID: [name], male   DOB: December 12, 1930, 89 y.o.   MRN: [id number]   HPI  States he is doing fine.  No chest pain, dyspnea, LE edema.  Asks about PT for ataxia.  Discussed he went through that in recent years and showed minimal improvement.   His home health caregiver is here today.   She comes around noon and gives him his morning meds then with lunch.  He receives "noon" meds at 4 p.m with his evening meal before she leaves.  She sets out his evening meds on his coffee table to take.  Sounds like he often misses. They are no longer using the prepackaged bubble packs sent from [facility] as he does not understand how to use, so she is taking them out of bubble pack and putting them into a single dosing pillbox (at least the evening doses) He is in need of a new monthly delivery of meds from [facility].   He is coughing up phlegm and wonders where his med for this is.  Discussed his Loratadine for allergies is in a separate bottle for him to use when he is symptomatic with allergies.  Showed his HH caregiver where this was and discarded 2 bottles that were past expiration date.    Meds:   Not using Albuterol  Unable to confirm meds as bubble packs with photos of pills not available  He is taking 4 different pills in the morning, which correlates with ASA, Furosemide 40 gm daily in AM, Hydralazine 100 mg, Metoprolol 25 mg.  He is taking 3 tabs with his 4 p.m. meal and 2 tabs set aside for bedtime, which he is to take on his own.  These correlate with his amlodipine 10 mg, Atorvastatin 80 mg, isosorbide mononitrate 120 mg, and evening doses of Metoprolol 25 mg and Hydralazine 100 mg.      No Known Allergies   Review of Systems    Objective:   BP 130/60 (BP Location: Left Arm, Patient Position: Sitting, Cuff Size: Normal)   Pulse 64   Resp 16   Physical Exam Alert, NAD HEENT:  PERRL, EOMI, throat without injection.  Sniffling. Neck:   Supple, No adenopathy Chest:  CTA CV:  RRR without murmur or rub.  Radial and DP pulses normal and equal Abd:  S, + BS LE:  No edema.     Assessment & Plan    Hypertension:  Despite once again, not having meds set up properly, he appears to be taking his meds fairly regularly.  Discussed with caregiver need to have granddaughter to call in and schedule delivery of meds ASAP.  Encouraged them to reset to automatic delivery--though the pharmacy will call prior to make sure someone at home.  They have a credit card set up to pay for meds.  2.  CAD:  stable  3.  Anemia:  hemoglobin down to 8.4 in January at time of hospital discharge.  Attempted to draw labs today, but his vein blew and unable to get adequate amount for all labs--planned for CBC, CMP, FLP.  Will see if anything can be run on very small amount obtained.  If not, will try to get him either into the office for blood draw or to [facility] drawing outpatient drawing station near [facility].  4.  CKD:  years of poorly controlled hypertension:  labs as above.  Fluid status appears fine currently.  5.  Dyslipidemia:  labs as above.  6.  Productive cough:  likely his seasonal allergies:  Encouraged them to use the Loratadine in separate bottle daily when symptomatic.  Expired Loratadine discarded.  He also continues to smoke and has never been interested in discontinuing.  7.  COPD:  stable.  Never feels he needs inhaler unless ill with respiratory infection.

## 2022-06-22 NOTE — Progress Notes (Signed)
Subjective:    Patient ID: [name], male   DOB: May 30, 1930, 89 y.o.   MRN: [id number]   HPI  Patient seen by Rodney Cruise, M.D. Visit transcribed from written record.  Please see original scanned into Media tab  Cough for 1-2 weeks with some wheezing and SOB.  Out of his inhaler a long time.  No nasal cong/fever/chills/ear pain/chest pain.  Coughing up "phlegm" - yellow. + wheezing off + on but denies severe SOB.  Appetite normal. Taking all his medicines and someone coming to his house to cook.   Smoking about 1 pack/wk + trying to quit.    Current Meds  Medication Sig   [EXPIRED] amoxicillin-clavulanate (AUGMENTIN) 875-125 MG tablet Take 1 tablet by mouth 2 (two) times daily for 5 days.   [DISCONTINUED] acetaminophen (TYLENOL) 325 MG tablet Take 2 tablets (650 mg total) by mouth every 6 (six) hours as needed for mild pain. (Patient not taking: Reported on 12/17/2021)   [DISCONTINUED] amLODipine (NORVASC) 10 MG tablet TAKE 1 TABLET BY MOUTH EVERY DAY WITH evening meal (Patient taking differently: Take 10 mg by mouth every evening.)   [DISCONTINUED] atorvastatin (LIPITOR) 80 MG tablet TAKE 1 TABLET BY MOUTH EVERY EVENING WITH A MEAL (Patient taking differently: Take 80 mg by mouth daily.)   [DISCONTINUED] docusate sodium (COLACE) 100 MG capsule Take 1 capsule (100 mg total) by mouth daily as needed for mild constipation.   [DISCONTINUED] furosemide (LASIX) 40 MG tablet TAKE 1 TABLET BY MOUTH EVERY DAY WITH morning meal (Patient taking differently: Take 40 mg by mouth daily. TAKE 1 TABLET BY MOUTH EVERY DAY WITH morning meal)   [DISCONTINUED] GNP ASPIRIN 81 MG tablet TAKE 1 TABLET BY MOUTH EVERY MORNING WITH A MEAL (Patient taking differently: Take 81 mg by mouth daily.)   [DISCONTINUED] hydrALAZINE (APRESOLINE) 100 MG tablet TAKE 1 TABLET BY MOUTH 2 TIMES DAILY WITH morning and evening meals (Patient taking differently: Take 100 mg by mouth 2 (two) times daily.)    [DISCONTINUED] isosorbide mononitrate (IMDUR) 120 MG 24 hr tablet TAKE 1 TABLET BY MOUTH EVERY EVENING WITH food (Patient taking differently: Take 120 mg by mouth every evening.)   [DISCONTINUED] loratadine (ALLERGY RELIEF) 10 MG tablet TAKE 1 TABLET BY MOUTH ONCE DAILY as needed for allergy symptoms (Patient taking differently: Take 10 mg by mouth daily as needed for allergies.)   [DISCONTINUED] losartan (COZAAR) 100 MG tablet TAKE 1 TABLET BY MOUTH EVERY DAY WITH morning meal (Patient taking differently: Take 100 mg by mouth daily.)   [DISCONTINUED] metoprolol tartrate (LOPRESSOR) 25 MG tablet TAKE 1 TABLET BY MOUTH 2 TIMES DAILY WITH morning and evening meals (Patient taking differently: Take 25 mg by mouth 2 (two) times daily.)   [DISCONTINUED] nitroGLYCERIN (NITROSTAT) 0.4 MG SL tablet PLACE 1 TABLET UNDER THE TONGUE EVERY 5 MINUTES AS NEEDED FOR CHEST PAIN, SEEK MEDICAL ATTENTION IF NO RELIEF (Patient taking differently: 0.4 mg every 5 (five) minutes x 3 doses as needed for chest pain (call MD, if no relief).)   [DISCONTINUED] polyethylene glycol (MIRALAX / GLYCOLAX) 17 g packet Take 17 g by mouth daily as needed for mild constipation. (Patient taking differently: Take 17 g by mouth daily as needed for mild constipation (MIX AS DIRECTED AND DRINK).)   [DISCONTINUED] predniSONE (DELTASONE) 20 MG tablet Take 2 tablets (40 mg total) by mouth daily.   No Known Allergies   Review of Systems    Objective:   BP (!) 112/56 (BP Location: Right Arm,  Patient Position: Sitting, Cuff Size: Normal)   Pulse 64   Resp 16   Ht 5' 5.5" (1.664 m)   Wt 135 lb (61.2 kg)   BMI 22.12 kg/m   Physical Exam WNWD  NAD Skin:  Nl HEENT:  TM's L normal  R cerumen.  MM's moist No lesions Pharynx benign No sinus tenderness. Neck:  nontender/no adenopathy Chest:  Decreased BS's with wheezes + a few creps in lower lobes.   Upper lung fields clear. CV:  RRR Abd/Pelvis:  NT Estrems:  No edema; No  cyanosis   Assessment & Plan   COPD exacerbation:   Albuterol MDI 2 puffs Q 6 hrs prn  #1/NRF Prednisone 20 mg 2 po qd x 3 days Augmenting 875 1 po BID x 5 days  2.  Quit smoking!  3.  HTN:  controlled-continue Amlodipine 10 mg qd Losaratan 100 qd Metoprolol 25 BID  F/U if not better in 2 weeks -sooner if no relief or getting worse.

## 2022-06-23 ENCOUNTER — Other Ambulatory Visit (HOSPITAL_COMMUNITY): Payer: Self-pay

## 2022-06-23 DIAGNOSIS — N1831 Chronic kidney disease, stage 3a: Secondary | ICD-10-CM | POA: Diagnosis not present

## 2022-06-23 DIAGNOSIS — N179 Acute kidney failure, unspecified: Secondary | ICD-10-CM | POA: Diagnosis not present

## 2022-06-23 DIAGNOSIS — F329 Major depressive disorder, single episode, unspecified: Secondary | ICD-10-CM

## 2022-06-23 DIAGNOSIS — U071 COVID-19: Secondary | ICD-10-CM | POA: Diagnosis not present

## 2022-06-23 DIAGNOSIS — E43 Unspecified severe protein-calorie malnutrition: Secondary | ICD-10-CM

## 2022-06-23 LAB — CBC
HCT: 31.6 % — ABNORMAL LOW (ref 39.0–52.0)
Hemoglobin: 10.3 g/dL — ABNORMAL LOW (ref 13.0–17.0)
MCH: 28.1 pg (ref 26.0–34.0)
MCHC: 32.6 g/dL (ref 30.0–36.0)
MCV: 86.3 fL (ref 80.0–100.0)
Platelets: 218 10*3/uL (ref 150–400)
RBC: 3.66 MIL/uL — ABNORMAL LOW (ref 4.22–5.81)
RDW: 13.6 % (ref 11.5–15.5)
WBC: 6.1 10*3/uL (ref 4.0–10.5)
nRBC: 0 % (ref 0.0–0.2)

## 2022-06-23 LAB — BASIC METABOLIC PANEL
Anion gap: 10 (ref 5–15)
BUN: 60 mg/dL — ABNORMAL HIGH (ref 8–23)
CO2: 22 mmol/L (ref 22–32)
Calcium: 10 mg/dL (ref 8.9–10.3)
Chloride: 106 mmol/L (ref 98–111)
Creatinine, Ser: 2.37 mg/dL — ABNORMAL HIGH (ref 0.61–1.24)
GFR, Estimated: 26 mL/min — ABNORMAL LOW (ref >60)
Glucose, Bld: 81 mg/dL (ref 70–99)
Potassium: 4.7 mmol/L (ref 3.5–5.1)
Sodium: 138 mmol/L (ref 135–145)

## 2022-06-23 MED ORDER — MIRTAZAPINE 7.5 MG PO TABS
7.5000 mg | ORAL_TABLET | Freq: Every day | ORAL | 0 refills | Status: DC
Start: 2022-06-23 — End: 2022-10-09
  Filled 2022-06-23: qty 30, 30d supply, fill #0

## 2022-06-23 MED ORDER — ENSURE ENLIVE PO LIQD
237.0000 mL | Freq: Three times a day (TID) | ORAL | 12 refills | Status: DC
  Filled 2022-06-23: qty 237, 1d supply, fill #0

## 2022-06-23 MED ORDER — MIRTAZAPINE 15 MG PO TABS: 7.5000 mg | ORAL_TABLET | Freq: Every day | ORAL | Status: DC

## 2022-06-23 MED ORDER — ADULT MULTIVITAMIN W/MINERALS CH
1.0000 | ORAL_TABLET | Freq: Every day | ORAL | 0 refills | Status: AC
  Filled 2022-06-23: qty 30, 30d supply, fill #0

## 2022-06-23 NOTE — Care Management Important Message (Signed)
Important Message  Patient Details  Name: [name] MRN: [id number] Date of Birth: 05/03/1930   Medicare Important Message Given:  Yes     [name] 06/23/2022, 4:20 PM

## 2022-06-23 NOTE — TOC Transition Note (Signed)
Transition of Care [facility]) - CM/SW Discharge Note   Patient Details  Name: [name] MRN: [id number] Date of Birth: Jul 21, 1930  Transition of [facility]) CM/SW Contact:  Leander Rams, LCSW Phone Number: 06/23/2022, 2:49 PM   Clinical Narrative:    Patient will DC to: [facility] Anticipated DC date: 06/23/2022 Family notified: [name] Transport by: Sharin Mons   Per MD patient ready for DC to [facility]. RN, patient, patient's family, and facility notified of DC. Discharge Summary and FL2 sent to facility. RN to call report prior to discharge [phone]. DC packet on chart. Ambulance transport requested for patient.   CSW will sign off for now as social work intervention is no longer needed. Please consult [place] again if new needs arise.    Final next level of care: Skilled Nursing Facility Barriers to Discharge: No Barriers Identified   Patient Goals and CMS Choice      Discharge Placement                Patient chooses bed at:  [facility]) Patient to be transferred to facility by: [facility] Name of family member notified: [name] Patient and family notified of of transfer: 06/23/22  Discharge Plan and Services Additional resources added to the After Visit Summary for                                       Social Determinants of Health (SDOH) Interventions SDOH Screenings   Food Insecurity: Patient Unable To Answer (06/20/2022)  Housing: Low Risk  (06/20/2022)  Transportation Needs: No Transportation Needs (06/20/2022)  Utilities: Not At Risk (06/20/2022)  Tobacco Use: High Risk (06/22/2022)     Readmission Risk Interventions     No data to display           Oletta Lamas, MSW, LCSWA, LCASA Transitions of Care  Clinical Social Worker I

## 2022-06-23 NOTE — Discharge Instructions (Addendum)
Dear Marinell Blight,  It was a pleasure to take care of you during your stay at  [facility]  where you were treated for your COVID-19.  While you were here, you were:  observed and cared for by our nurses and nursing assistants  provided therapy by our physical therapy and occupational therapy staff  treated with medicines / procedures by your doctors  provided resources by our social workers and case managers  Please review the medication list provided to you at discharge and stop, start taking, or continue taking the medications listed there.  You should also follow-up with your primary care doctor, or start seeing one if you don't have one yet. If applicable, here are some scheduled follow-ups for you:    I recommend abstinence from alcohol, tobacco, and other illicit drug use.   If your symptoms recur, worsen, or if you have side effects to your medications, call your outpatient provider, 911, or go to the nearest emergency department.  Take care!  Augusto Gamble, MD [facility] Health Physician 06/23/2022 1:27 PM

## 2022-06-23 NOTE — Discharge Summary (Signed)
Name: [name] MRN: [id number] DOB: 1930/04/25 89 y.o. PCP: Julieanne Manson, MD  Date of Admission: 06/19/2022 10:47 PM Date of Discharge: 06/23/2022  Attending Physician: Dr.  Ninetta Lights  Discharge Diagnosis: Principal Problem:   COVID-19 Active Problems:   CKD stage 3b, GFR 30-44 ml/min   AKI (acute kidney injury)   Mild protein-calorie malnutrition   Protein-calorie malnutrition, severe   Major depressive disorder with current active episode    Discharge Medications: Allergies as of 06/23/2022   No Known Allergies      Medication List     STOP taking these medications    furosemide 40 MG tablet Commonly known as: LASIX   hydrALAZINE 100 MG tablet Commonly known as: APRESOLINE   isosorbide mononitrate 120 MG 24 hr tablet Commonly known as: IMDUR   metoprolol tartrate 25 MG tablet Commonly known as: LOPRESSOR   nitroGLYCERIN 0.4 MG SL tablet Commonly known as: NITROSTAT       TAKE these medications    albuterol 108 (90 Base) MCG/ACT inhaler Commonly known as: ProAir HFA Inhale 2 puffs into the lungs every 6 (six) hours as needed for wheezing or shortness of breath.   amLODipine 10 MG tablet Commonly known as: NORVASC TAKE 1 TABLET BY MOUTH EVERY DAY with evening meal What changed: See the new instructions.   aspirin EC 81 MG tablet Commonly known as: GNP Aspirin Take 1 tablet (81 mg total) by mouth daily. Swallow whole.   atorvastatin 80 MG tablet Commonly known as: LIPITOR TAKE 1 TABLET BY MOUTH EVERY EVENING WITH A MEAL What changed: See the new instructions.   feeding supplement Liqd Take 237 mLs by mouth 3 (three) times daily between meals.   mirtazapine 7.5 MG tablet Commonly known as: REMERON Take 1 tablet (7.5 mg total) by mouth at bedtime.   multivitamin with minerals Tabs tablet Take 1 tablet by mouth daily. Start taking on: June 24, 2022        Disposition and follow-up:   [name] was discharged from [facility] in Stable condition.  At the hospital follow up visit please address:  1.  Follow-up:  a. Heart failure and antihypertensive medications (held during hospitalization)    b. Response to mirtazepine for MDD, outpatient psychiatry referral   c. Nutritional needs  2.  Labs / imaging needed at time of follow-up: CMP (recheck CK), CT scan in 12 months for pulmonary nodule  3.  Pending labs/ test needing follow-up: thiamine  Hospital Course by problem list: [name] is a 89 y.o. person living with a history of hypertension, CKD stage 4, CAD s/p stenting, prior CVA, and hyperparathyroidism who presented with a two week history of generalized fatigue and admitted for COVID-19 infection and an acute-on-chronic kidney injury in the setting of protein-calorie malnutrition  #Protein-calorie malnutrition #Major depressive disorder +/- mild cognitive impairment #Hypoactive delirium On exam patient has decreased muscle bulk throughout with notable atrophy of the thenar eminence and on admission BMI was 18.37  - this is evidence of protein-calorie malnutrition. Patient also endorses depressed mood, anhedonia, and poor sleep. Collateral information reveals poor appetite and decreased motor activity. These symptoms have been present > 2 weeks. He meets criteria for MDD. There is also collateral information suggestive cognitive impairments lasting 1 year which is concerning for mild cognitive impairment. TSH, vitamin D, B12 wnl. During his hospitalization, patient was seen by PT/OT and SLP. He was also seen by a registered dietician. He was provided dietary supplementation and  multi-vitamins. On day of discharge, patient orientation is improved, but did present with fluctuating orientation throughout hospitalization characteristic of hypoactive delirium. He will be going to SNF and will require further rehabilitation. He was also started on mirtazepine for MDD.   #AKI on CKD4, like  pre-renal in the setting of dehydration Pt with a two week history of lethargy and fatigue. Granddaughter confirms his appetite has decreased and he does not have the same energy.  During hospitalization he received 1L NS bolus and 2L LR bolus. Nephrotoxic medications were held. Baseline creatinine seems to be around 2.5, admission creatinine was 3.27 but continues to downtrend (on day of discharge CK 2.37).   #COVID-19 infection  Patient wast found to be positive for COVID-19 on PCR testing when entering the emergency department. Unsure timeline on when patient first started feeling symptoms, seems to be around two weeks ago, which put him out of the timeline for Paxlovid.Chest x-ray did not show signs of pneumonia, but did have wet cough on admission and throughout hospital stay. He was afebrile. He did not require any oxygen supplementation.   #Incidental Pulmonary Nodule  Noted on CT scan on previous admission, but present is a 13x68mm ground-glass opacity in the left lower lobe. Adenocarcinoma can not be excluded. Seems to not have grown in size. Will need another CT scan in 12 months in outpatient setting  Chronic Problems CAD s/p Stenting - Continue aspirin  Prior CVA - Continue atorvastatin HTN - held antihypertensives HF - held HF medications Asymptomatic bradycardia - chronic problem   Discharge Subjective: Today, patient's daughter [name] was in the room. Patient is much more awake this morning. Is oriented to self, place, not time. Knows he came to hospital because he was sick. He says he is ready to go home. He agrees he's not getting what he needs at home including his nutrition needs.    When asked about his wife he becomes tearful. Says, "everything is gone." Says he doesn't really sleep. When asked what he enjoys doing he says "sit down and think." He is interested in medicine to help mood/sleep/eating. 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  Discharge Exam:   BP (!) 152/62 (BP Location: Left Arm)   Pulse (!) 58   Temp 98 F (36.7 C) (Oral)   Resp 16   Ht  (1.702 m)   Wt 58.2 kg   SpO2 100%   BMI 20.10 kg/m  General: in no acute distress HEENT: normocephalic and atraumatic Respiratory: non-labored breathing, on RA, and with rhonchi throughout MSK: moving all extremities spontaneously. Notable decreased muscle bulk throughout and thenar muscle wasting Gastrointestinal: non-tender and non-distended Cardiovascular: bradycardic Neuro: awake, oriented to self, place, and vaguely to situation. Does not know the year.  Pertinent Labs, Studies, and Procedures:     Latest Ref Rng & Units 06/23/2022    6:21 AM 06/22/2022    1:25 AM 06/21/2022    1:03 AM  CBC  WBC 4.0 - 10.5 K/uL 6.1  4.7  5.9   Hemoglobin 13.0 - 17.0 g/dL 32.4  9.4  40.1   Hematocrit 39.0 - 52.0 % 31.6  29.7  32.6   Platelets 150 - 400 K/uL 218  203  243        Latest Ref Rng & Units 06/23/2022    6:21 AM 06/22/2022    1:25 AM 06/21/2022    2:27 AM  CMP  Glucose  70 - 99 mg/dL 81  962  85   BUN 8 - 23 mg/dL 60  78  84   Creatinine 0.61 - 1.24 mg/dL 9.52  8.41  3.24   Sodium 135 - 145 mmol/L 138  137  133   Potassium 3.5 - 5.1 mmol/L 4.7  4.1  3.9   Chloride 98 - 111 mmol/L 106  108  105   CO2 22 - 32 mmol/L 22  21  20    Calcium 8.9 - 10.3 mg/dL 40.1  9.9  9.7     CT Renal Stone Study  Result Date: 06/20/2022 CLINICAL DATA:  Flank pain. EXAM: CT ABDOMEN AND PELVIS WITHOUT CONTRAST TECHNIQUE: Multidetector CT imaging of the abdomen and pelvis was performed following the standard protocol without IV contrast. RADIATION DOSE REDUCTION: This exam was performed according to the departmental dose-optimization program which includes automated exposure control, adjustment of the mA and/or kV according to patient size and/or use of iterative reconstruction technique. COMPARISON:  March 01, 2022. FINDINGS: Lower chest: Minimal right posterior  basilar subsegmental atelectasis. Continued presence of 13 x 11 mm ground-glass opacity seen in left lower lobe. Hepatobiliary: No focal liver abnormality is seen. Status post cholecystectomy. No biliary dilatation. Pancreas: Unremarkable. No pancreatic ductal dilatation or surrounding inflammatory changes. Spleen: Normal in size without focal abnormality. Adrenals/Urinary Tract: Adrenal glands are unremarkable. Kidneys are normal, without renal calculi, focal lesion, or hydronephrosis. Bladder is unremarkable. Stomach/Bowel: The stomach is unremarkable. Status post right hemicolectomy. There is no evidence of bowel obstruction or inflammation. Vascular/Lymphatic: Aortic atherosclerosis. No enlarged abdominal or pelvic lymph nodes. Reproductive: Prostate is unremarkable. Other: No abdominal wall hernia or abnormality. No abdominopelvic ascites. Musculoskeletal: Multilevel degenerative changes are noted in lower lumbar spine. No acute osseous abnormality is noted. IMPRESSION: Minimal right posterior basilar subsegmental atelectasis. Status post right hemicolectomy. Multilevel degenerative disc disease in lower lumbar spine. No acute abnormality seen in the abdomen or pelvis. Continued presence of 13 x 11 mm ground-glass opacity seen in left lower lobe.Adenocarcinoma cannot be excluded. Follow up by CT is recommended in 12 months, with continued annual surveillance for a minimum of 3 years. These recommendations are taken from: Recommendations for the Management of Subsolid Pulmonary Nodules Detected at CT: A Statement from the Fleischner Society Radiology 2013; 266:1, 760 195 2000. Aortic Atherosclerosis (ICD10-I70.0). Electronically Signed   By: Lupita Raider M.D.   On: 06/20/2022 08:28   DG Chest 2 View  Result Date: 06/19/2022 CLINICAL DATA:  Cough, generalized weakness. EXAM: CHEST - 2 VIEW COMPARISON:  02/28/2022. FINDINGS: The heart size and mediastinal contours are within normal limits. There is  atherosclerotic calcification of the aorta. Mild atelectasis is noted at the lung bases. No consolidation, effusion, or pneumothorax. No acute osseous abnormality. Surgical clips are present in the right upper quadrant. IMPRESSION: Mild atelectasis at the lung bases. Electronically Signed   By: Thornell Sartorius M.D.   On: 06/19/2022 23:31     Discharge Instructions:  Dear Marinell Blight,   It was a pleasure to take care of you during your stay at  [facility]  where you were treated for your COVID-19.   While you were here, you were:  observed and cared for by our nurses and nursing assistants  provided therapy by our physical therapy and occupational therapy staff  treated with medicines / procedures by your doctors  provided resources by our social workers and case managers   Please review the medication list provided to you at  discharge and stop, start taking, or continue taking the medications listed there.   You should also follow-up with your primary care doctor, or start seeing one if you don't have one yet. If applicable, here are some scheduled follow-ups for you:     I recommend abstinence from alcohol, tobacco, and other illicit drug use.   If your symptoms recur, worsen, or if you have side effects to your medications, call your outpatient provider, 911, or go to the nearest emergency department.   Take care!    Signed: Augusto Gamble, MD 06/23/2022, 1:21 PM   Pager: [phone]

## 2022-06-23 NOTE — Progress Notes (Signed)
PT Cancellation Note  Patient Details Name: [name] MRN: [id number] DOB: 1930/08/26   Cancelled Treatment:    Reason Eval/Treat Not Completed: Patient declined, no reason specified (Pt initially agreeable to PT however once cued to come to EOB pt adamently refused stating that he was "too cold to sit on the side of the bed". Despite max encouragement for several minutes, pt continued to refuse stating "Not today young man!")   Gladys Damme 06/23/2022, 12:36 PM

## 2022-06-23 NOTE — Progress Notes (Addendum)
HD#3 SUBJECTIVE:  Patient Summary: [name] is a 89 y.o. person living with a history of hypertension, CKD stage 4, CAD s/p stenting, prior CVA, and hyperparathyroidism who presented with a two week history of generalized fatigue and admitted for COVID-19 infection and an AKI.   Overnight Events: none   Interm History:  4/13 admitted  Today, patient's daughter [name] was in the room. Patient is much more awake this morning. Is oriented to self, place, not time. Knows he came to hospital because he was sick. He says he is ready to go home. He agrees he's not getting what he needs at home including his nutrition needs.   When asked about his wife he becomes tearful. Says, "everything is gone." Says he doesn't really sleep. When asked what he enjoys doing he says "sit down and think." He is interested in medicine to help mood/sleep/eating. 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   OBJECTIVE:  Vital Signs: Vitals:   06/23/22 0009 06/23/22 0458 06/23/22 0500 06/23/22 0725  BP: (!) 151/59 (!) 143/66  (!) 154/64  Pulse: 99 (!) 51    Resp: 13 14  14   Temp: 98.3 F (36.8 C) 98 F (36.7 C)    TempSrc: Oral Oral    SpO2: 100% 97%  99%  Weight:   58.2 kg   Height:       SpO2: 99 %  Filed Weights   06/21/22 0552 06/22/22 0352 06/23/22 0500  Weight: 53.2 kg 54.5 kg 58.2 kg    Intake/Output Summary (Last 24 hours) at 06/23/2022 0757 Last data filed at 06/23/2022 0500 [name] per 24 hour  Intake --  Output 600 ml  Net -600 ml   Net IO Since Admission: -1,910 mL [06/23/22 0757]  Physical Exam: General: in no acute distress HEENT: normocephalic and atraumatic Respiratory: non-labored breathing, on RA, and with rhonchi throughout MSK: moving all extremities spontaneously. Notable decreased muscle bulk throughout and thenar muscle wasting Gastrointestinal: non-tender and non-distended Cardiovascular: bradycardic Neuro: awake, oriented to self, place, and  vaguely to situation. Does not know the year.  Patient Lines/Drains/Airways Status     Active Line/Drains/Airways     Name Placement date Placement time Site Days   Peripheral IV 06/20/22 20 G Posterior;Left Forearm 06/20/22  0618  Forearm  1   Peripheral IV 06/20/22 22 G Right Forearm 06/20/22  2145  Forearm  1   External Urinary Catheter 06/20/22  --  --  1            Pertinent Labs:    Latest Ref Rng & Units 06/23/2022    6:21 AM 06/22/2022    1:25 AM 06/21/2022    1:03 AM  CBC  WBC 4.0 - 10.5 K/uL 6.1  4.7  5.9   Hemoglobin 13.0 - 17.0 g/dL 16.1  9.4  09.6   Hematocrit 39.0 - 52.0 % 31.6  29.7  32.6   Platelets 150 - 400 K/uL 218  203  243        Latest Ref Rng & Units 06/23/2022    6:21 AM 06/22/2022    1:25 AM 06/21/2022    2:27 AM  CMP  Glucose 70 - 99 mg/dL 81  045  85   BUN 8 - 23 mg/dL 60  78  84   Creatinine 0.61 - 1.24 mg/dL 4.09  8.11  9.14   Sodium 135 - 145 mmol/L 138  137  133  Potassium 3.5 - 5.1 mmol/L 4.7  4.1  3.9   Chloride 98 - 111 mmol/L 106  108  105   CO2 22 - 32 mmol/L Calcium 8.9 - 10.3 mg/dL 40.9  9.9  9.7     No results for input(s): "GLUCAP" in the last 72 hours.   Pertinent Imaging: No results found.  ASSESSMENT/PLAN:  Assessment: Principal Problem:   COVID-19 Active Problems:   CKD stage 3b, GFR 30-44 ml/min   Mild protein-calorie malnutrition   Protein-calorie malnutrition, severe  [name] is a 89 y.o. person living with a history of hypertension, CKD stage 4, CAD s/p stenting, prior CVA, and hyperparathyroidism who presented with a two week history of generalized fatigue and admitted for COVID-19 infection and an AKI  Plan: #Protein-calorie malnutrition #Major depressive disorder +/- mild cognitive impairment #Hypoactive delirium On exam patient has decreased muscle bulk throughout with notable atrophy of the thenar eminence - this is evidence of protein-calorie malnutrition. Patient endorses depressed  mood, anhedonia, and poor sleep. Collateral information reveals poor appetite and decreased motor activity. These symptoms have been present > 2 weeks. He meets criteria for MDD. TSH, vitamin D, B12 wnl. Today patient orientation is improved. He is awaiting SNF placement.  Plan: - TOC consult for SNF, outpatient psychiatry referral - RD consulted for assessment of nutritional needs - continue ensure supplement TID between meals for calorie supplementation - continue daily multivitamin supplementation - PT/OT, SLP eval and treat - delirium, fall precautions - start mirtazepine 7.5 mg at bedtime for mood, appetite, and sleep - f/u vitamin B1  #AKI on CKD4, like pre-renal in the setting of dehydration Pt with a two week history of lethargy and fatigue. Granddaughter confirms his appetite has decreased and he does not have the same energy. Baseline creatinine seems to be around 2.5, admission creatinine was 3.27 but continues to downtrend (today 2.37). He has since received 1L NS bolus and 2L LR bolus.  Plan: - Bolus as needed  - Renal studies AM  - Avoid nephrotoxic medications    #COVID-19 infection  Pt found to be positive for COVID-19 on PCR testing when entering the emergency department. Chest x-ray did not show signs of pneumonia, but today he does have a wet cough and there's some rales in RLL. Vitals are stable. He is not requiring any extra oxygen. Unsure timeline on when patient first started feeling symptoms, seems to be around two weeks ago, which puts him out of the timeline for Paxlovid. Will monitor and provide with supportive therapy.    Plan:  - Tylenol 650 mg Q6H PRN for fevers  - Fluids (as noted above)  - Monitoring respiratory status.    #Incidental Pulmonary Nodule  Noted on CT scan on previous admission, but present is a 13x40mm ground-glass opacity in the left lower lobe. Adenocarcinoma can not be excluded. Seems to not have grown in size. Will need another CT scan in  12 months.    #HTN  #HF Pt takes amlodipine ,  Lasix , Hydralazine , Imdur , and Lopressor  presumably for high blood pressure and heart failure. Per grandduaghter, she states he does have a history of heart failure. Last echo available is in 2019, which showed mild LVH, and EF of 55-60%, and grade 1 diastolic dysfunction. I suspect taking all of these medications at once on top of the dehydration from COVID may be contributing to his symptoms.    Plan:  -  Hold anti-hypertensive in the setting of concerns for hypotension and AKI.    Chronic Problems CAD s/p Stenting - Continue aspirin  Prior CVA - Continue atorvastatin Asymptomatic bradycardia - chronic problem  Best Practice: Diet: Normal VTE: Enoxaparin IVF: none Code: Full   Prior to Admission Living Arrangement: [name] Anticipated Discharge Location: [name] Barriers to Discharge: Medical Management  Signature: Glen Leriche, MD [facility] Health Physician 06/23/2022 7:57 AM  Please contact the on call pager after 5 pm and on weekends at [phone].

## 2022-06-23 NOTE — Progress Notes (Signed)
Report called to facility nurse receiving patient.

## 2022-06-24 LAB — VITAMIN B1: Vitamin B1 (Thiamine): 72.4 nmol/L (ref 66.5–200.0)

## 2022-07-27 ENCOUNTER — Telehealth: Payer: Self-pay

## 2022-07-27 NOTE — Telephone Encounter (Signed)
Patient's daughter called,  Patient needs an appointment because patient has not been feeling well for a week,  No medications taken . Hoarsley voice for a month.  We will call when an appointment is available.

## 2022-07-29 ENCOUNTER — Other Ambulatory Visit: Payer: 59 | Admitting: Internal Medicine

## 2022-07-29 VITALS — BP 110/60 | HR 60 | Resp 20

## 2022-07-29 DIAGNOSIS — J302 Other seasonal allergic rhinitis: Secondary | ICD-10-CM

## 2022-07-29 DIAGNOSIS — R051 Acute cough: Secondary | ICD-10-CM | POA: Diagnosis not present

## 2022-07-29 NOTE — Telephone Encounter (Signed)
Patient has been scheduled for a HomeVisit on 07/29/2022

## 2022-08-03 ENCOUNTER — Encounter: Payer: Self-pay | Admitting: Internal Medicine

## 2022-08-03 DIAGNOSIS — J302 Other seasonal allergic rhinitis: Secondary | ICD-10-CM | POA: Insufficient documentation

## 2022-08-03 NOTE — Progress Notes (Signed)
Subjective:    Patient ID: [name], male   DOB: 07/21/1930, 89 y.o.   MRN: [id number]   HPI  Home visit [name] only one present. Unable to find bubble pack for meds while there, so med list is uncertain.  Family called asking for a home visit as he was coughing again and complaining of not feeling well.  Granddaughter was concerned he had developed COVID again as similar symptoms.  [name] states he has a a cough for about 1 week. No fever, dyspnea, rhinorrhea or sneezing.  No itching of eyes, nose, throat or ears.  No ear pain.   Does have clear phlegm in his throat. He is hoarse. Sleeping all day and tired. Appetite is fine. Denies chest pain, LE edema, PND or orthopnea symptoms. No nausea, vomiting or diarrhea.   He is sitting outside on porch daily.   He thinks he is taking his loratadine, but I spoke with his daughter, [name], and she is not certain.  This is separate from his bubble pack.   I found all the bottles I went over with his caregiver months ago still sitting in same place on his TV stand/chest of drawers.  [name] was not aware they were there.   He continues to smoke, though not clear how many cigarettes daily.  Has an empty pack sitting on his coffee table and an open pack in his shirt pocket.    Current Meds  Medication Sig   albuterol (PROAIR HFA) 108 (90 Base) MCG/ACT inhaler Inhale 2 puffs into the lungs every 6 (six) hours as needed for wheezing or shortness of breath.   amLODipine (NORVASC) 10 MG tablet TAKE 1 TABLET BY MOUTH EVERY DAY with evening meal (Patient taking differently: Take 10 mg by mouth daily.)   aspirin EC (GNP ASPIRIN) 81 MG tablet Take 1 tablet (81 mg total) by mouth daily. Swallow whole.   atorvastatin (LIPITOR) 80 MG tablet TAKE 1 TABLET BY MOUTH EVERY EVENING WITH A MEAL (Patient taking differently: Take 80 mg by mouth every evening.)  He is taking Mucinex OTC  No Known Allergies   Review of Systems    Objective:   BP  110/60 (BP Location: Right Arm, Patient Position: Sitting, Cuff Size: Normal)   Pulse 60   Resp 20   SpO2 99%   Physical Exam NAD Mild hoarseness.  Minimal coughing when in his home. HEENT:  PERRL, EOMI, Eyes watering and mild injection.  Throat without injection.  Perhaps some mild cobbling of posterior pharynx.   Neck:  Supple, No adenopathy Chest:  decreased BS throughout, but no wheeze or crackles. CV:  RRR without murmur or rub.   Abd:  S, + BS, NT LE:  No edema.     Assessment & Plan   Likely allergies as spending time outside.  Doubt he is taking his Loratadine.  Encouraged him to do so.  His adult grandson at this point speaks up--he has been lying on floor in adjacent bedroom.  Discussed where the Loratadine is and to take daily.  Shared this also with his daughter, [name].    2.  COPD and tobacco use:  encouraged him to stop using--we have discussed nicotine lozenges for this in past.  He is not interested.  Shared my concerns so grandson is aware and that no one else should smoke around [name] or in the home as have had reports of smoking substances around him regularly.  3.  Hypertension:  well controlled, but see significant changes have been made to his med list and will need to confer with [facility] as to what has been sent out.  Also, he is to start with new home health soon and should be for more hours and better food preparation/supervision of meds throughout day.  My understanding is that he was still on previously prescribed meds when paperwork filled out recently.

## 2022-08-26 ENCOUNTER — Encounter: Payer: Self-pay | Admitting: Internal Medicine

## 2022-09-09 ENCOUNTER — Telehealth: Payer: Self-pay

## 2022-09-09 NOTE — Telephone Encounter (Signed)
[name] called to let use know that Home nurse services has ended for [name] until he has had a physical. She would like to get him set up with a home visit physical to get him restarted with a home nurse.

## 2022-09-11 NOTE — Telephone Encounter (Signed)
Called [facility] at [phone] Spoke with Zannie Cove She states by the provider call in information, Mr. Trask is eligible and covered for [facility] services and does not require an assessment. [name] can call into the member line with reference number:  [id number] regarding my call and let them know he in fact, does not require an assessment and his HH should be reinstated.

## 2022-09-15 NOTE — Telephone Encounter (Signed)
[name] has been notified of documented information.

## 2022-09-29 ENCOUNTER — Other Ambulatory Visit: Payer: Self-pay | Admitting: Internal Medicine

## 2022-10-02 ENCOUNTER — Other Ambulatory Visit: Payer: Self-pay

## 2022-10-08 ENCOUNTER — Telehealth: Payer: Self-pay

## 2022-10-08 ENCOUNTER — Other Ambulatory Visit (INDEPENDENT_AMBULATORY_CARE_PROVIDER_SITE_OTHER): Payer: 59 | Admitting: Internal Medicine

## 2022-10-08 DIAGNOSIS — I10 Essential (primary) hypertension: Secondary | ICD-10-CM

## 2022-10-08 DIAGNOSIS — R001 Bradycardia, unspecified: Secondary | ICD-10-CM | POA: Diagnosis not present

## 2022-10-08 NOTE — Telephone Encounter (Signed)
Spoke to [place] and she is not away of any other finding besides patients bp and pulse being low. My understanding is that the nurse also called [facility] to stop patients medications. [name] expressed that patient seems fine and does not have any concerns. We do not have a name for the nurse that visited patient, their number is [phone].

## 2022-10-08 NOTE — Telephone Encounter (Signed)
[facility] called to let us know that a [employer] nurse visited patient and found his bp to be 90/54 and pulse 49.

## 2022-10-09 ENCOUNTER — Encounter: Payer: Self-pay | Admitting: Internal Medicine

## 2022-10-09 ENCOUNTER — Other Ambulatory Visit (INDEPENDENT_AMBULATORY_CARE_PROVIDER_SITE_OTHER): Payer: 59 | Admitting: Internal Medicine

## 2022-10-09 ENCOUNTER — Other Ambulatory Visit: Payer: Self-pay

## 2022-10-09 DIAGNOSIS — R001 Bradycardia, unspecified: Secondary | ICD-10-CM | POA: Diagnosis not present

## 2022-10-09 DIAGNOSIS — I10 Essential (primary) hypertension: Secondary | ICD-10-CM

## 2022-10-09 DIAGNOSIS — E785 Hyperlipidemia, unspecified: Secondary | ICD-10-CM

## 2022-10-09 MED ORDER — METOPROLOL TARTRATE 25 MG PO TABS: ORAL_TABLET | ORAL | 11 refills | Status: DC

## 2022-10-09 MED ORDER — ATORVASTATIN CALCIUM 80 MG PO TABS
ORAL_TABLET | ORAL | 11 refills | Status: DC
Start: 2022-10-09 — End: 2023-09-20

## 2022-10-09 MED ORDER — FUROSEMIDE 20 MG PO TABS: ORAL_TABLET | ORAL | 11 refills | Status: DC

## 2022-10-09 MED ORDER — FUROSEMIDE 20 MG PO TABS: 20.0000 mg | ORAL_TABLET | Freq: Every day | ORAL | 11 refills | Status: DC

## 2022-10-09 MED ORDER — METOPROLOL TARTRATE 25 MG PO TABS: 12.5000 mg | ORAL_TABLET | Freq: Two times a day (BID) | ORAL | 11 refills | Status: DC

## 2022-10-09 MED ORDER — HYDRALAZINE HCL 25 MG PO TABS: ORAL_TABLET | ORAL | 11 refills | Status: DC

## 2022-10-09 NOTE — Telephone Encounter (Signed)
Patient seen in home after hours yesterday--see home visit from same date of initial telephone note.   Called his daughter and notified to cut Metoprolol in half to 12.5 mg twice daily Furosemide cut to 20 mg in morning Hydralazine and Atorvastatin moved to evening meal so only taking twice daily med dosing instead of 3.   Went back to home today with pill cutter and cut pills and rearranged bubble pack to instructions above. He will come in in 1 week for labs and bp/pulse check.

## 2022-10-09 NOTE — Progress Notes (Signed)
Subjective:    Patient ID: [name], male   DOB: [birth year], 89 y.o.   MRN: [id number]   HPI  Home visit this evening as alerted by patient's daughter, [name], and [facility] that a nurse practitioner from his Medicare came to apt earlier today, found his pulse to be 48 and BP low normal.   She told family to discontinue all his meds and called [facility] to discontinue his meds He denies light headedness when sits or stands.  Not feeling great as napping during day and not sleeping at night.  No chest pain, dyspnea or LE edema. His grandsons, Minerva Areola and Alinda Money, are living at his home.  He does not say they keep him up.    He now has a health aid coming into his home who is making sure he takes his meds and so much more consistent with meds.   Appears looking at his bubble packs that he is mainly just missing his evening Atorvastatin and Hydralazine dosing as those are separated to bedtime dosing for some reason.  He states he is urinating all the time--has been a problem for a while.  He is taking Furosemide 40 mg daily in morning.  No dysuria or fever.  Current Meds  Medication Sig   albuterol (PROAIR HFA) 108 (90 Base) MCG/ACT inhaler Inhale 2 puffs into the lungs every 6 (six) hours as needed for wheezing or shortness of breath.   amLODipine (NORVASC) 10 MG tablet TAKE 1 TABLET BY MOUTH EVERY DAY with evening meal   aspirin EC (ASPIRIN LOW DOSE) 81 MG tablet TAKE 1 TABLET BY MOUTH EVERY MORNING WITH meal   atorvastatin (LIPITOR) 80 MG tablet TAKE 1 TABLET BY MOUTH EVERY EVENING WITH A MEAL   furosemide (LASIX) 20 MG tablet Take 1 tablet (20 mg total) by mouth daily. (Patient taking differently: Take 40 mg by mouth daily.)   hydrALAZINE (APRESOLINE) 25 MG tablet Take 25 mg by mouth 2 (two) times daily. With morning and evening meals   loratadine (CLARITIN) 10 MG tablet TAKE 1 TABLET BY MOUTH ONCE DAILY AS NEEDED for allergy symptoms   metoprolol tartrate (LOPRESSOR) 25 MG  tablet Take 0.5 tablets (12.5 mg total) by mouth 2 (two) times daily.   [DISCONTINUED] hydrALAZINE (APRESOLINE) 25 MG tablet Take 25 mg by mouth 3 (three) times daily.  Actually taking Metoprolol 25 mg twice daily at time of visit--decreased to 12.5 twice daily at end of visit.   No Known Allergies   Review of Systems    Objective:   Orthostatic VS for the past 72 hrs (Last 3 readings):  Orthostatic BP Patient Position BP Location Cuff Size Orthostatic Pulse  10/08/22 1815 120/62 Standing Right Arm Normal 56  10/08/22 1805 110/54 Sitting Right Arm Normal 52  10/08/22 1800 108/58 Supine Right Arm Normal 60     Physical Exam NAD Lungs:  CTA CV:  RRR with bradycardia.   LE:  skin wrinkly and dry.  No edema. He got up and walked back and forth in living room with wheeled walker without difficulty.   Assessment & Plan   Bradycardia with low normal BP and no orthostasis.  Concerned his HR did not significantly increase when he was standing up and walking, though BP actually went up despite this. Decrease Metoprolol to 25 mg 1/2 tab twice daily. Decrease Furosemide to 20 mg  in morning.    2.  Move Atorvastatin and evening Hydralazine to evening meal with rest if  evening pills to simplify meds.      Called and left message with [name] about the changes and to call.  Will notify pharmacy tomorrow.   Will need bp and pulse check and fasting labs in 1 week at office.

## 2022-10-09 NOTE — Progress Notes (Signed)
Returned for home visit again today. Spoke with his daughter, [name], on the phone while there as unable to reach last evening after home visit. Discussed using the bubble packs as they are rather than emptying what is essentially a pill box into a different pill box and then laying out the day's pills on 2 separate dosing on the coffee table. Cutting Metoprolol to 25 mg 1/2 tab twice daily Cutting Furosemide to 40 mg 1/2 tab every morning Moving second dose of hydralazine and his Atorvastatin from bedtime to evening meal dosing.   His next month's bubble packs were changed today for above dosing and instructions given to his health aid and his grandson, Alinda Money. Delila Spence, was also instructed Let them know his Loratadine for congestion and cough was available as well Encouraged him to stop smoking.  PE:  Awake and alert HR today was 60 sitting.  Had his first half dose of Metoprolol and Furosemide this morning.

## 2022-10-16 ENCOUNTER — Other Ambulatory Visit: Payer: 59

## 2022-10-23 ENCOUNTER — Other Ambulatory Visit (INDEPENDENT_AMBULATORY_CARE_PROVIDER_SITE_OTHER): Payer: 59

## 2022-10-23 VITALS — BP 118/60 | HR 68

## 2022-10-23 DIAGNOSIS — E785 Hyperlipidemia, unspecified: Secondary | ICD-10-CM | POA: Diagnosis not present

## 2022-10-23 DIAGNOSIS — Z0130 Encounter for examination of blood pressure without abnormal findings: Secondary | ICD-10-CM

## 2022-10-23 DIAGNOSIS — D649 Anemia, unspecified: Secondary | ICD-10-CM

## 2022-10-23 DIAGNOSIS — N184 Chronic kidney disease, stage 4 (severe): Secondary | ICD-10-CM

## 2022-10-23 LAB — POCT URINALYSIS DIPSTICK
Bilirubin, UA: NEGATIVE
Blood, UA: NEGATIVE
Glucose, UA: NEGATIVE
Ketones, UA: NEGATIVE
Leukocytes, UA: NEGATIVE
Nitrite, UA: NEGATIVE
Protein, UA: NEGATIVE
Spec Grav, UA: 1.02 (ref 1.010–1.025)
Urobilinogen, UA: 0.2 E.U./dL
pH, UA: 6 (ref 5.0–8.0)

## 2022-10-24 LAB — COMPREHENSIVE METABOLIC PANEL
ALT: 5 IU/L (ref 0–44)
AST: 10 IU/L (ref 0–40)
Albumin: 3.7 g/dL (ref 3.6–4.6)
Alkaline Phosphatase: 92 IU/L (ref 44–121)
BUN/Creatinine Ratio: 22 (ref 10–24)
BUN: 53 mg/dL — ABNORMAL HIGH (ref 10–36)
Bilirubin Total: 0.4 mg/dL (ref 0.0–1.2)
CO2: 19 mmol/L — ABNORMAL LOW (ref 20–29)
Calcium: 10.9 mg/dL — ABNORMAL HIGH (ref 8.6–10.2)
Chloride: 104 mmol/L (ref 96–106)
Creatinine, Ser: 2.37 mg/dL — ABNORMAL HIGH (ref 0.76–1.27)
Globulin, Total: 4 g/dL (ref 1.5–4.5)
Glucose: 82 mg/dL (ref 70–99)
Potassium: 4.3 mmol/L (ref 3.5–5.2)
Sodium: 138 mmol/L (ref 134–144)
Total Protein: 7.7 g/dL (ref 6.0–8.5)
eGFR: 25 mL/min/{1.73_m2} — ABNORMAL LOW (ref >59)

## 2022-10-24 LAB — LIPID PANEL W/O CHOL/HDL RATIO
Cholesterol, Total: 111 mg/dL (ref 100–199)
HDL: 50 mg/dL (ref >39)
LDL Chol Calc (NIH): 49 mg/dL (ref 0–99)
Triglycerides: 49 mg/dL (ref 0–149)
VLDL Cholesterol Cal: 12 mg/dL (ref 5–40)

## 2022-10-24 LAB — CBC WITH DIFFERENTIAL/PLATELET
Basophils Absolute: 0.1 10*3/uL (ref 0.0–0.2)
Basos: 1 %
EOS (ABSOLUTE): 0.9 10*3/uL — ABNORMAL HIGH (ref 0.0–0.4)
Eos: 17 %
Hematocrit: 31.9 % — ABNORMAL LOW (ref 37.5–51.0)
Hemoglobin: 10 g/dL — ABNORMAL LOW (ref 13.0–17.7)
Immature Grans (Abs): 0 10*3/uL (ref 0.0–0.1)
Immature Granulocytes: 0 %
Lymphocytes Absolute: 1.1 10*3/uL (ref 0.7–3.1)
Lymphs: 21 %
MCH: 27 pg (ref 26.6–33.0)
MCHC: 31.3 g/dL — ABNORMAL LOW (ref 31.5–35.7)
MCV: 86 fL (ref 79–97)
Monocytes Absolute: 0.5 10*3/uL (ref 0.1–0.9)
Monocytes: 9 %
Neutrophils Absolute: 2.9 10*3/uL (ref 1.4–7.0)
Neutrophils: 52 %
Platelets: 201 10*3/uL (ref 150–450)
RBC: 3.7 x10E6/uL — ABNORMAL LOW (ref 4.14–5.80)
RDW: 13.8 % (ref 11.6–15.4)
WBC: 5.5 10*3/uL (ref 3.4–10.8)

## 2022-11-11 ENCOUNTER — Telehealth: Payer: Self-pay | Admitting: Internal Medicine

## 2022-11-11 MED ORDER — ISOSORBIDE MONONITRATE ER 120 MG PO TB24: ORAL_TABLET | ORAL | 10 refills | Status: DC

## 2022-11-11 NOTE — Telephone Encounter (Signed)
[facility] pharmacy after discussing pill box with home care giver, Aggie Cosier.   His Isosorbide is still in bedtime bubble and needs to be with evening meal. Med was restarted following last hospitalization as outpatient as prescription was already with [facility]. Sending new Rx for the Isosorbide to be given at evening meal

## 2023-01-19 ENCOUNTER — Other Ambulatory Visit: Payer: Self-pay

## 2023-01-19 MED ORDER — NITROGLYCERIN 0.4 MG SL SUBL: SUBLINGUAL_TABLET | SUBLINGUAL | 1 refills | Status: DC

## 2023-04-01 ENCOUNTER — Telehealth: Payer: Self-pay | Admitting: Internal Medicine

## 2023-04-01 ENCOUNTER — Other Ambulatory Visit: Payer: 59 | Admitting: Internal Medicine

## 2023-04-22 ENCOUNTER — Other Ambulatory Visit: Payer: 59

## 2023-04-29 ENCOUNTER — Other Ambulatory Visit: Payer: 59

## 2023-05-31 ENCOUNTER — Other Ambulatory Visit: Payer: Self-pay

## 2023-05-31 MED ORDER — NITROGLYCERIN 0.4 MG SL SUBL: SUBLINGUAL_TABLET | SUBLINGUAL | 1 refills | Status: DC

## 2023-06-04 ENCOUNTER — Other Ambulatory Visit: Payer: Self-pay | Admitting: Internal Medicine

## 2023-07-26 ENCOUNTER — Telehealth: Payer: Self-pay | Admitting: Internal Medicine

## 2023-07-26 NOTE — Telephone Encounter (Signed)
 Patient has been scheduled for 07/27/2023 at 12 for home visit.  Attempted to call back to patient's daughter and home number unable to contact. Spoke with Aden Agreste patient's daughter, she states she will notify patient of appointment

## 2023-07-26 NOTE — Telephone Encounter (Signed)
 Patient's daughter called and stated that patient needs and appointment for  Patient has been having right foot swelling causing him pain for the past 2 weeks. Patient has been taking tylenol  for pain but has not helped. Patient's daughter reports that patient has a spot on the top of foot, it does not seem like a bruise but patient reports it hurts if touched.  Patient's daughter would like to know if patient can also get a referral for Podiatry . Daughter thinks he might have an ingrow nail that may be causing the symptoms.

## 2023-07-27 ENCOUNTER — Other Ambulatory Visit (INDEPENDENT_AMBULATORY_CARE_PROVIDER_SITE_OTHER): Admitting: Internal Medicine

## 2023-07-27 VITALS — BP 124/60 | HR 60 | Resp 20

## 2023-07-27 DIAGNOSIS — I10 Essential (primary) hypertension: Secondary | ICD-10-CM | POA: Diagnosis not present

## 2023-07-27 DIAGNOSIS — M109 Gout, unspecified: Secondary | ICD-10-CM

## 2023-07-27 NOTE — Telephone Encounter (Signed)
 Patient's daughter [name] has been notify about today's appointment at 12:30 PM. Appointment was confirm.

## 2023-07-28 ENCOUNTER — Encounter: Payer: Self-pay | Admitting: Internal Medicine

## 2023-07-28 DIAGNOSIS — M109 Gout, unspecified: Secondary | ICD-10-CM | POA: Insufficient documentation

## 2023-07-28 MED ORDER — PREDNISONE 10 MG PO TABS
ORAL_TABLET | ORAL | 0 refills | Status: DC
Start: 2023-07-28 — End: 2023-11-19

## 2023-07-28 NOTE — Progress Notes (Signed)
 Subjective:    Patient ID: [name], male   DOB: 05-14-1930, 89 y.o.   MRN: [id number]   HPI  Home visit--documentation day after visit in Epic  Daughter, Malvin Searing, who is a CNA with [employer], is now his [facility] caregiver  She is at the home M-F from generally 8 am to 11 a.m -1:30 pm at latest.  No one is checking in with him over the weekend.  She states no one in family is willing to be there on weekend days. Grandsons are no longer living with him.   She found his apartment to be a mess, including the refrigerator. She makes him breakfast and makes sure he takes morning pills. She has been transferring his already bubble packed pm pills to a different pill box.   He misses his pm pills 3 or more times weekly He gets mobile meals for lunch. She puts his dinner in microwave for him to heat up before evening meal.    He has been complaining of his right great toe with pain and swelling for past 2 weeks--spreading to his dorsal foot.  No fever. No dyspnea, but with cough for several weeks.  Does have history of allergies and she has been making sure he takes his Claritin .   No itchy watery eyes, sneezing, runny nose, sore throat or itchy throat. Does not bring up sputum to see  Current Meds  Medication Sig   amLODipine  (NORVASC ) 10 MG tablet TAKE 1 TABLET BY MOUTH EVERY DAY WITH evening meal   aspirin  EC (ASPIRIN  LOW DOSE) 81 MG tablet TAKE 1 TABLET BY MOUTH EVERY MORNING WITH meal   atorvastatin  (LIPITOR ) 80 MG tablet TAKE 1 TABLET BY MOUTH EVERY EVENING WITH A MEAL   furosemide  (LASIX ) 20 MG tablet 1 tab by mouth daily with morning meal   hydrALAZINE  (APRESOLINE ) 25 MG tablet 1 tab by mouth twice daily with morning and evening meals   isosorbide  mononitrate (IMDUR ) 120 MG 24 hr tablet 1 tab by mouth daily with evening meal   loratadine  (CLARITIN ) 10 MG tablet TAKE 1 TABLET BY MOUTH ONCE DAILY AS NEEDED FOR ALLERGY SYMPTOMS   predniSONE  (DELTASONE ) 10 MG tablet 3 tabs by mouth  once daily for 2 day then 2 tabs by mouth once daily for 2d then 1 tab once daily for 2 days then 1/2 tab once daily for 2 days then off   No Known Allergies   Review of Systems    Objective:   BP 124/60 (BP Location: Right Arm, Patient Position: Sitting, Cuff Size: Normal)   Pulse 60  RR 20  Physical Exam NAD.  Good color and alert. No coughing during visit until asked to do so. HEENT:  PERRL, EOMI, conjunctivae without injection Neck:  supple, No adenopathy Chest:  rhonchi over entire left lung field that clears completely with deep cough.  CTA bilaterally after cough.  No respiratory distress or use of accessory muscles. CV:  RRR without murmur or rub.  Radial and DP pulses normal LE:  No edema.  Right great toe, MTP joint in particular swollen with mild erythema and hyperpigmentation at base of toe.  Swelling and erythema extend onto dorsal mid foot.   Very tender great toe and along extension of erythema.       Assessment & Plan   Gout, right great toe and foot:  sending in Prednisone  10 mg tabs to take 3 tabs daily for 2 days, then 2 tabs for 2 days,  1 tab for two days then 1/2 tab for 2 days, then off.  [name] would like to have delivered tomorrow.  She is to call if he develops fever or new symptoms.  2.  Medication:  to avoid more work by moving pills from bubble pack to pill box.  To lay out pm meds on paper with large letters to remind him to take with evening meal.   She will see about stopping by on weekend mornings to make sure he takes am pills and lay out pm pills.  3.  Congestion:  likely allergies:  continue Claritin 

## 2023-07-29 NOTE — Telephone Encounter (Signed)
 Per Dr. Jayne Mews called patient's daughter [name] to ask her if she has given patient [name]  medication prescribed at visit.  [name] stated she did gave patient medication in the morning with breakfast. Patient's daughter would like to know if medication can make patient drowsy as she has noticed that he has been sleeping, daughter states patient took medication and was talking to her for like an hour and then went to sleep.  Notify daughter I will inform doctor and I will call her back

## 2023-07-29 NOTE — Telephone Encounter (Signed)
 Per Dr. Jayne Mews, called [name] patient's daughter to notified her that doctor not too worried if patient takes a nap, but if patient develops other symptoms and feels like he is sick , she needs to call the office and let the doctor know. Patient's daughter agree.

## 2023-08-03 ENCOUNTER — Telehealth: Payer: Self-pay

## 2023-08-03 DIAGNOSIS — L602 Onychogryphosis: Secondary | ICD-10-CM

## 2023-08-03 NOTE — Telephone Encounter (Signed)
 Spoke with patient's daughter states patient is still having right swelling in his toes.   Reports is still taking prednisone  1/2 tab daily with only one day left.   Patient would like a referral for podiatry

## 2023-08-10 NOTE — Telephone Encounter (Signed)
 Patient has been scheduled for 08/11/23 at 12:00 PM

## 2023-08-10 NOTE — Telephone Encounter (Signed)
 Per Dr. Jayne Mews patient needs appt

## 2023-08-11 ENCOUNTER — Encounter: Payer: Self-pay | Admitting: Internal Medicine

## 2023-08-11 ENCOUNTER — Ambulatory Visit: Admitting: Internal Medicine

## 2023-08-11 ENCOUNTER — Ambulatory Visit (INDEPENDENT_AMBULATORY_CARE_PROVIDER_SITE_OTHER): Admitting: Internal Medicine

## 2023-08-11 VITALS — BP 100/60 | HR 54 | Resp 16 | Ht 65.0 in | Wt 108.0 lb

## 2023-08-11 DIAGNOSIS — R3589 Other polyuria: Secondary | ICD-10-CM

## 2023-08-11 DIAGNOSIS — I10 Essential (primary) hypertension: Secondary | ICD-10-CM | POA: Diagnosis not present

## 2023-08-11 DIAGNOSIS — R350 Frequency of micturition: Secondary | ICD-10-CM

## 2023-08-11 DIAGNOSIS — Z79899 Other long term (current) drug therapy: Secondary | ICD-10-CM

## 2023-08-11 DIAGNOSIS — D649 Anemia, unspecified: Secondary | ICD-10-CM

## 2023-08-11 DIAGNOSIS — M109 Gout, unspecified: Secondary | ICD-10-CM

## 2023-08-11 DIAGNOSIS — N189 Chronic kidney disease, unspecified: Secondary | ICD-10-CM

## 2023-08-11 NOTE — Progress Notes (Signed)
 Subjective:    Patient ID: [name], male   DOB: Jul 09, 1930, 89 y.o.   MRN: [id number]   HPI   Gout of right great toe:  his toe was much improved after treatment with prednisone  burst and taper.  Not clear if he really has had increased pain again.  His daughter, [name], has not noted swelling returning.  He also has long, thick toenails and daughter would like a podiatry referral to provide foot care.    2.  Polyuria and urinary frequency:  may be significantly increased since on prednisone .  Can at times not get to urinal fast enough, but does sense need to urinate even if incontinent.  3.  Discussed end of life issues:  patient appears to understand questions.  He does not want intubation and CPR should he stop breathing or heart stop.  If there is a possibly reversible issue, such as pneumonia, will continue to treat aggressively with antibiotics and other support. Encouraged him to obtain and fill out a living will and have notarized for chart.   No code status from EMS to have in apartment  Current Meds  Medication Sig   amLODipine  (NORVASC ) 10 MG tablet TAKE 1 TABLET BY MOUTH EVERY DAY WITH evening meal   aspirin  EC (ASPIRIN  LOW DOSE) 81 MG tablet TAKE 1 TABLET BY MOUTH EVERY MORNING WITH meal   atorvastatin  (LIPITOR ) 80 MG tablet TAKE 1 TABLET BY MOUTH EVERY EVENING WITH A MEAL   furosemide  (LASIX ) 20 MG tablet 1 tab by mouth daily with morning meal   isosorbide  mononitrate (IMDUR ) 120 MG 24 hr tablet 1 tab by mouth daily with evening meal   loratadine  (CLARITIN ) 10 MG tablet TAKE 1 TABLET BY MOUTH ONCE DAILY AS NEEDED FOR ALLERGY SYMPTOMS   metoprolol  tartrate (LOPRESSOR ) 25 MG tablet 1/2 tab by mouth twice daily with morning and evening meals   nitroGLYCERIN  (NITROSTAT ) 0.4 MG SL tablet PLACE 1 TABLET UNDER THE TONGUE EVERY 5 MINUTES UP TO 3x AS NEEDED FOR CHEST PAIN, SEEK MEDICAL ATTENTION IF NO RELIEF   No Known Allergies   Review of Systems    Objective:    BP 100/60 (BP Location: Left Arm, Patient Position: Sitting, Cuff Size: Normal)   Pulse (!) 54   Resp 16   Ht 5\' 5"  (1.651 m)   Wt 108 lb (49 kg)   SpO2 99%   BMI 17.97 kg/m   Physical Exam NAD Lungs:  CTA CV:  RRR without murmur or rub.  Radial and DP pulses normal and equal Abd:  S, NT, No HSM or mass, + BS.  No flank tenderness or suprapubic tenderness.   LE;  Mild darkening of skin over right great toe MTP and onto distal dorsal foot.  No swelling or erythema currently.  Mild tenderness over great to MTP Feet dry and flaky on plantar surfaces.  Toenails long and thick but not discolored. Patient ambulates down hallway with rolling walker without discomfort.   Assessment & Plan   Right great toe and foot gout:  check uric acid.  Appears much improved.  No limping despite mild pain  2.   Need for regular foot care:  referral to podiatry.  3.  Hypertension:  controlled.    4.  Code Status:  discussed today.  Patient requests DNR, but to be aggressive with treatment of reversible health issues otherwise.  Family will check into getting documents filled out and DNR for home.  5.  CKD:  CMP  6.  Anemia:  CBC  7.  Polyuria and urinary frequency:  UA and culture.  A1C.  Unable to obtain blood or urine specimen--going to [facility] draw station and [name] will obtain urine specimen at home.

## 2023-08-11 NOTE — Telephone Encounter (Signed)
 Need for general foot care in elderly patient

## 2023-08-11 NOTE — Addendum Note (Signed)
 Addended by: Trudi Fus on: 08/11/2023 08:39 AM   Modules accepted: Orders

## 2023-08-11 NOTE — Telephone Encounter (Signed)
 Patient has been scheduled for 11/09/23 for labs and 11/11/2023 for home visit with Dr. Jayne Mews.

## 2023-08-12 ENCOUNTER — Telehealth: Payer: Self-pay | Admitting: Internal Medicine

## 2023-08-12 NOTE — Telephone Encounter (Signed)
 Patient's daughter called to ask if Doctor could catheterize patient,  Daughter reports patient was supposed to do a UA at home since he was unable to do UA at office.  Patient's daughter would like to know if patient can be catheterized by doctor if she brings patient to office as trying to get urine sample at home was unsuccessful.  Daughter would like to know if can not be done at office and UA is needed urgent can patient go to a place where he can be catheterized.

## 2023-08-12 NOTE — Telephone Encounter (Signed)
 Per dr. Jayne Mews have patient to urinate in portable clean urinal and pour the urine to our container and bring it in  Daughter agrees

## 2023-08-12 NOTE — Telephone Encounter (Signed)
 Spoke with patient and reports if patient drinks plenty of water I can pour the urine into a container for her.   Agrees to come Friday

## 2023-08-13 ENCOUNTER — Telehealth: Payer: Self-pay | Admitting: Internal Medicine

## 2023-08-13 NOTE — Telephone Encounter (Signed)
 Called [facility] customer service.  His labs did get drawn on the 5th. Hemoglobin remains stable at 10.9 BUN/Crea actually slightly better at 42/2.14 A1C is getting closer to diabetic range at 6.2%--needs to make sure he is getting good fat and protein and not too many carbohydrates in his meals.   Diabetic range is at 6.5% Calcium  is relatively stable at 10.9 Uric acid at 11.3, which is a bit elevated--he may have developed gout due to kidney disease. When his foot is pain free, will start a medicine to prevent future flares of gout. Let Patrician know to call us  when his toe is baseline and also call back out in another week to check if we do not hear from her.

## 2023-08-17 ENCOUNTER — Telehealth: Payer: Self-pay | Admitting: Internal Medicine

## 2023-08-17 NOTE — Telephone Encounter (Signed)
 Patient has been scheduled for 08/18/23 at 6:00 PM for a home visit with Dr. Jayne Mews.

## 2023-08-17 NOTE — Telephone Encounter (Signed)
 Patient's daughter [name] called and states that she would like to know how her father's pulse has been running in his previous appointments.  Notified daughter patient's pulse for last appointment was 54.  Daughter states there is currently a nurse with patient at his house and states that patient's heart rate is 43.  Daughter would like to know if that is normal , if patient's pulse has been like that previously or if that is something that they need to check.

## 2023-08-17 NOTE — Telephone Encounter (Signed)
 Per Doctor Jayne Mews she will call patient's daughter to get more information.

## 2023-08-18 ENCOUNTER — Other Ambulatory Visit (INDEPENDENT_AMBULATORY_CARE_PROVIDER_SITE_OTHER): Admitting: Internal Medicine

## 2023-08-18 VITALS — BP 152/62 | HR 68

## 2023-08-18 DIAGNOSIS — R001 Bradycardia, unspecified: Secondary | ICD-10-CM

## 2023-08-18 DIAGNOSIS — Z711 Person with feared health complaint in whom no diagnosis is made: Secondary | ICD-10-CM | POA: Diagnosis not present

## 2023-08-19 ENCOUNTER — Other Ambulatory Visit: Payer: Self-pay | Admitting: Internal Medicine

## 2023-08-19 NOTE — Telephone Encounter (Signed)
 Results faxed and on pcp desk

## 2023-08-23 ENCOUNTER — Encounter: Payer: Self-pay | Admitting: Internal Medicine

## 2023-08-23 NOTE — Progress Notes (Signed)
 Home visit  Yesterday, received report that patient was evaluated by [facility] nurse (not his daughter, [name]) with normal BP and feeling fine, but HR at 42.  This initially with pulse ox, but then checked reportedly by actually checking pulse for a minute.    Daughter, [name], who is his home health caregiver, states he has been taking meds regularly from his pill pack.  She did give his meds this morning, including his low dose Metoprolol  of 12.5 mg twice daily.  Current Meds  Medication Sig   amLODipine  (NORVASC ) 10 MG tablet TAKE 1 TABLET BY MOUTH EVERY DAY WITH evening meal   ASPIRIN  EC ADULT LOW DOSE 81 MG tablet TAKE 1 TABLET BY MOUTH EVERY MORNING WITH A meal   atorvastatin  (LIPITOR ) 80 MG tablet TAKE 1 TABLET BY MOUTH EVERY EVENING WITH A MEAL   furosemide  (LASIX ) 20 MG tablet 1 tab by mouth daily with morning meal   hydrALAZINE  (APRESOLINE ) 25 MG tablet 1 tab by mouth twice daily with morning and evening meals   isosorbide  mononitrate (IMDUR ) 120 MG 24 hr tablet 1 tab by mouth daily with evening meal   loratadine  (CLARITIN ) 10 MG tablet TAKE 1 TABLET BY MOUTH ONCE DAILY AS NEEDED FOR ALLERGY SYMPTOMS   metoprolol  tartrate (LOPRESSOR ) 25 MG tablet 1/2 tab by mouth twice daily with morning and evening meals    No Known Allergies  PE Today's Vitals   08/18/23 1110  BP: (!) 152/62  Pulse: 68   There is no height or weight on file to calculate BMI.  Lungs:  CTA CV:  RRR with normal radial pulses. LE:  trace edema at ankles   A/P:  Reported asymptomatic bradycardia:  fine today.  To call if recurrent findings.

## 2023-09-03 ENCOUNTER — Encounter: Payer: Self-pay | Admitting: Podiatry

## 2023-09-03 ENCOUNTER — Ambulatory Visit (INDEPENDENT_AMBULATORY_CARE_PROVIDER_SITE_OTHER): Admitting: Podiatry

## 2023-09-03 DIAGNOSIS — M79671 Pain in right foot: Secondary | ICD-10-CM | POA: Diagnosis not present

## 2023-09-03 DIAGNOSIS — M79672 Pain in left foot: Secondary | ICD-10-CM

## 2023-09-03 DIAGNOSIS — B351 Tinea unguium: Secondary | ICD-10-CM | POA: Diagnosis not present

## 2023-09-03 DIAGNOSIS — M722 Plantar fascial fibromatosis: Secondary | ICD-10-CM | POA: Diagnosis not present

## 2023-09-03 NOTE — Progress Notes (Signed)
 Patient presents for evaluation and treatment of tenderness and some redness around nails feet.  Tenderness around toes with walking and wearing shoes.  Physical exam:  General appearance: Alert, pleasant, and in no acute distress.  Vascular: Pedal pulses: DP 1/4 B/L, PT 0/4 B/L.  Moderate edema lower legs bilaterally.  Capillary refill time immediate bilaterally  Neurological:  Grossly intact.  Light touch intact.  Slightly diminished Achilles tendon reflex  Dermatologic:  Nails thickened, disfigured, discolored 1-5 BL with subungual debris.  Redness and hypertrophic nail folds along nail folds bilaterally but no signs of drainage or infection.  Skin thin and atrophic with no hair growth on the lower extremity and areas of hyperpigmentation.  Musculoskeletal:  Some soreness along the medial edge of the plantar fascial band in the middle one third of the band.  No fibromas palpable.   Diagnosis: 1. Painful onychomycotic nails 1 through 5 bilaterally. 2. Pain toes 1 through 5 bilaterally. 3 plantar fasciitis bilaterally  Plan: -New patient visit office level 3 for evaluation and management.  Modifier 25. - Recommend wearing a good supportive shoes.  Not going barefoot.  Recommended icing as needed.  Can also use Voltaren gel as needed.  Recommend periodic debridement of the onychomycotic nails 1 through 5 bilaterally - Debrided onychomycotic nails 1 through 5 bilaterally.  Return 3 months

## 2023-09-20 ENCOUNTER — Other Ambulatory Visit: Payer: Self-pay | Admitting: Internal Medicine

## 2023-09-20 DIAGNOSIS — E785 Hyperlipidemia, unspecified: Secondary | ICD-10-CM

## 2023-09-21 ENCOUNTER — Other Ambulatory Visit: Payer: Self-pay | Admitting: Internal Medicine

## 2023-09-28 ENCOUNTER — Telehealth: Payer: Self-pay | Admitting: Internal Medicine

## 2023-09-28 DIAGNOSIS — I739 Peripheral vascular disease, unspecified: Secondary | ICD-10-CM

## 2023-09-28 NOTE — Telephone Encounter (Signed)
 Estela , Nurse practitioner calling to give report of a screening test she performed to patient . Would like to give report to Doctor Adella,

## 2023-09-28 NOTE — Telephone Encounter (Signed)
 NP from [facility] PAD screen right leg severe at 0.14 and Left moderate at 0.30

## 2023-09-29 ENCOUNTER — Telehealth: Payer: Self-pay | Admitting: Internal Medicine

## 2023-09-29 NOTE — Telephone Encounter (Signed)
 Patient's daughter [name] called today and states she would like to talk to Doctor Adella if she can return her call to her phone number [phone].  Daughter would like to discuss with doctor about findings a nurse from [facility] had when visiting patient , daughter states she needs to talk to the doctor about findings ,  Daughter states it is not urgent doctor can call her when there is time.

## 2023-10-05 ENCOUNTER — Ambulatory Visit
Admission: RE | Admit: 2023-10-05 | Discharge: 2023-10-05 | Disposition: A | Discharge status: Home / Self Care | Source: Ambulatory Visit | Attending: Internal Medicine | Admitting: Internal Medicine

## 2023-10-05 DIAGNOSIS — I739 Peripheral vascular disease, unspecified: Secondary | ICD-10-CM

## 2023-10-18 ENCOUNTER — Other Ambulatory Visit: Payer: Self-pay | Admitting: Internal Medicine

## 2023-10-20 ENCOUNTER — Ambulatory Visit: Payer: Self-pay | Admitting: Internal Medicine

## 2023-11-09 ENCOUNTER — Other Ambulatory Visit

## 2023-11-09 DIAGNOSIS — I10 Essential (primary) hypertension: Secondary | ICD-10-CM

## 2023-11-09 DIAGNOSIS — E785 Hyperlipidemia, unspecified: Secondary | ICD-10-CM

## 2023-11-10 LAB — COMPREHENSIVE METABOLIC PANEL WITH GFR
ALT: 9 IU/L (ref 0–44)
AST: 20 IU/L (ref 0–40)
Albumin: 3.9 g/dL (ref 3.6–4.6)
Alkaline Phosphatase: 114 IU/L (ref 44–121)
BUN/Creatinine Ratio: 25 — ABNORMAL HIGH (ref 10–24)
BUN: 54 mg/dL — ABNORMAL HIGH (ref 10–36)
Bilirubin Total: 0.5 mg/dL (ref 0.0–1.2)
Calcium: 10.8 mg/dL — ABNORMAL HIGH (ref 8.6–10.2)
Chloride: 106 mmol/L (ref 96–106)
Creatinine, Ser: 2.14 mg/dL — ABNORMAL HIGH (ref 0.76–1.27)
Globulin, Total: 4.6 g/dL — ABNORMAL HIGH (ref 1.5–4.5)
Glucose: 80 mg/dL (ref 70–99)
Potassium: 4.9 mmol/L (ref 3.5–5.2)
Sodium: 139 mmol/L (ref 134–144)
Total Protein: 8.5 g/dL (ref 6.0–8.5)
eGFR: 29 mL/min/1.73 — ABNORMAL LOW (ref >59)

## 2023-11-10 LAB — CBC WITH DIFFERENTIAL/PLATELET
Basophils Absolute: 0.1 x10E3/uL (ref 0.0–0.2)
Basos: 1 %
EOS (ABSOLUTE): 0.9 x10E3/uL — ABNORMAL HIGH (ref 0.0–0.4)
Eos: 14 %
Hematocrit: 36.3 % — ABNORMAL LOW (ref 37.5–51.0)
Hemoglobin: 11.3 g/dL — ABNORMAL LOW (ref 13.0–17.7)
Immature Grans (Abs): 0.1 x10E3/uL (ref 0.0–0.1)
Immature Granulocytes: 1 %
Lymphocytes Absolute: 1.5 x10E3/uL (ref 0.7–3.1)
Lymphs: 23 %
MCH: 28.5 pg (ref 26.6–33.0)
MCHC: 31.1 g/dL — ABNORMAL LOW (ref 31.5–35.7)
MCV: 92 fL (ref 79–97)
Monocytes Absolute: 0.6 x10E3/uL (ref 0.1–0.9)
Monocytes: 9 %
Neutrophils Absolute: 3.3 x10E3/uL (ref 1.4–7.0)
Neutrophils: 52 %
Platelets: 207 x10E3/uL (ref 150–450)
RBC: 3.96 x10E6/uL — ABNORMAL LOW (ref 4.14–5.80)
RDW: 13.8 % (ref 11.6–15.4)
WBC: 6.4 x10E3/uL (ref 3.4–10.8)

## 2023-11-10 LAB — LIPID PANEL
Chol/HDL Ratio: 2.4 ratio (ref 0.0–5.0)
Cholesterol, Total: 105 mg/dL (ref 100–199)
HDL: 44 mg/dL (ref >39)
LDL Chol Calc (NIH): 47 mg/dL (ref 0–99)
Triglycerides: 63 mg/dL (ref 0–149)
VLDL Cholesterol Cal: 14 mg/dL (ref 5–40)

## 2023-11-11 ENCOUNTER — Ambulatory Visit: Admitting: Internal Medicine

## 2023-11-11 ENCOUNTER — Telehealth: Payer: Self-pay | Admitting: Podiatry

## 2023-11-11 VITALS — BP 118/70 | Resp 16 | Ht 65.0 in | Wt 118.0 lb

## 2023-11-11 DIAGNOSIS — I739 Peripheral vascular disease, unspecified: Secondary | ICD-10-CM | POA: Diagnosis not present

## 2023-11-11 DIAGNOSIS — R531 Weakness: Secondary | ICD-10-CM

## 2023-11-11 DIAGNOSIS — D649 Anemia, unspecified: Secondary | ICD-10-CM

## 2023-11-11 DIAGNOSIS — R001 Bradycardia, unspecified: Secondary | ICD-10-CM

## 2023-11-11 DIAGNOSIS — E785 Hyperlipidemia, unspecified: Secondary | ICD-10-CM | POA: Diagnosis not present

## 2023-11-11 DIAGNOSIS — J449 Chronic obstructive pulmonary disease, unspecified: Secondary | ICD-10-CM

## 2023-11-11 DIAGNOSIS — G119 Hereditary ataxia, unspecified: Secondary | ICD-10-CM

## 2023-11-11 DIAGNOSIS — N184 Chronic kidney disease, stage 4 (severe): Secondary | ICD-10-CM

## 2023-11-11 DIAGNOSIS — I10 Essential (primary) hypertension: Secondary | ICD-10-CM

## 2023-11-11 DIAGNOSIS — H6123 Impacted cerumen, bilateral: Secondary | ICD-10-CM | POA: Diagnosis not present

## 2023-11-11 MED ORDER — ALBUTEROL SULFATE HFA 108 (90 BASE) MCG/ACT IN AERS: 2.0000 | INHALATION_SPRAY | Freq: Four times a day (QID) | RESPIRATORY_TRACT | 1 refills | Status: DC | PRN

## 2023-11-11 MED ORDER — TRELEGY ELLIPTA 100-62.5-25 MCG/ACT IN AEPB: INHALATION_SPRAY | RESPIRATORY_TRACT | 11 refills | Status: AC

## 2023-11-11 NOTE — Telephone Encounter (Signed)
 Called and left message to reschedule appointment.

## 2023-11-11 NOTE — Progress Notes (Signed)
 Subjective:    Patient ID: [name], male   DOB: February 03, 1931, 89 y.o.   MRN: [id number]   HPI  Here with granddaughter, [name],  who is also his caretaker.   PAD:  had a LE PAD screening at home from medicare followed by Arterial US , ABI, both showing moderate bilateral peripheral arterial disease of bilateral LE.  He denies pain in his legs with walking.  He is, however, very sedentary and has cerebellar ataxia.  He continues to chain smoke per his granddaughter.  He does complain of LE weakness and gets dyspneic easily.   He also has chronic normocytic anemia, likely due to CKD from years of poorly controlled hypertension.    2.  Dyspnea and mucous in throat:  [name] states he always seems to have mucous in his throat and with minimal exertion, gets dyspneic.  Discussed he has had inhalers prescribed in years past for COPD, but declines to use them as he feels he has not needed.  She has not seen his albuterol  inhaler in some time and was not aware he had one.  Discussed getting a screening  CT of chest, apparently recommended from Medicare.  He does have a Left lower lobe pulmonary nodule noted on renal stone study from 06/2022.    3.  Normocytic anemia:  has had this for a number of years.  Actually slight improvement recently.  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  No melena or hematochezia.    4.  Hypercholesterolemia:  recent labs at goal Lipid Panel     Component Value Date/Time   CHOL 105 11/09/2023 0911   TRIG 63 11/09/2023 0911   HDL 44 11/09/2023 0911   CHOLHDL 2.4 11/09/2023 0911   CHOLHDL 2.6 04/27/2016 0527   VLDL 7 04/27/2016 0527   LDLCALC 47 11/09/2023 0911   LABVLDL 14 11/09/2023 0911   5.  History of prediabetes:  glucose recently normal.  6.  Hypertension:  for many years, very poorly controlled BP, despite multiple home visits to set up pill boxes and write out instructions. Since the last two home health caregivers and bubble packs home  delivered from pharmacy, he has been better supported with taking meds regularly.  His bp has been ranging in low normal range and at times he is bradycardic.  We have weaned his Metoprolol  so that last visit, he was in low 60s with HR.    7.  CKD stage 4:  due to poorly controlled Hypertension.  Creatinine and GFR slightly improved recently, but function is poor.    Current Meds  Medication Sig   amLODipine  (NORVASC ) 10 MG tablet TAKE 1 TABLET BY MOUTH EVERY DAY WITH evening meal   ASPIRIN  EC ADULT LOW DOSE 81 MG tablet TAKE 1 TABLET BY MOUTH EVERY MORNING WITH A meal   atorvastatin  (LIPITOR ) 80 MG tablet TAKE 1 TABLET BY MOUTH EVERY DAY with EVENING MEAL   furosemide  (LASIX ) 20 MG tablet TAKE 1 TABLET BY MOUTH ONCE DAILY with morning meal   hydrALAZINE  (APRESOLINE ) 25 MG tablet TAKE 1 TABLET BY MOUTH 2 TIMES DAILY with morning and evening meals   isosorbide  mononitrate (IMDUR ) 120 MG 24 hr tablet 1 tab by mouth daily with evening meal   loratadine  (CLARITIN ) 10 MG tablet TAKE 1 TABLET BY MOUTH ONCE DAILY AS NEEDED FOR ALLERGY SYMPTOMS   metoprolol  tartrate (LOPRESSOR ) 25 MG tablet TAKE 1/2 TABLET BY MOUTH 2 TIMES DAILY   nitroGLYCERIN  (NITROSTAT ) 0.4  MG SL tablet PLACE 1 TABLET UNDER THE TONGUE EVERY 5 MINUTES UP TO 3x AS NEEDED FOR CHEST PAIN, SEEK MEDICAL ATTENTION IF NO RELIEF   No Known Allergies   Review of Systems    Objective:   BP 118/70 (BP Location: Left Arm, Patient Position: Sitting, Cuff Size: Normal)   Resp 16   Ht 5' 5 (1.651 m)   Wt 118 lb (53.5 kg)   BMI 19.64 kg/m   Physical Exam Constitutional:      Comments: Thin  HENT:     Head: Normocephalic and atraumatic.     Right Ear: There is impacted cerumen.     Left Ear: There is impacted cerumen.     Mouth/Throat:     Mouth: Mucous membranes are moist.     Pharynx: Oropharynx is clear.     Comments: Upper dentures only Eyes:     Extraocular Movements: Extraocular movements intact.      Conjunctiva/sclera: Conjunctivae normal.     Pupils: Pupils are equal, round, and reactive to light.  Cardiovascular:     Rate and Rhythm: Regular rhythm. Bradycardia present.     Heart sounds: S1 normal and S2 normal. No murmur heard.    No friction rub. No S3 or S4 sounds.     Comments: Normal and equal radial pulses. Decreased, but palpable DP and PT pulses bilaterally. Feet warm Pulmonary:     Breath sounds: Decreased air movement present.     Comments: Scattered crackles/rhonchi throughout that clear significantly with cough productive of copious light yellow mucous.   Abdominal:     General: Bowel sounds are normal.     Palpations: Abdomen is soft.  Musculoskeletal:     Cervical back: Normal range of motion and neck supple.     Right lower leg: No edema.     Left lower leg: No edema.  Feet:     Right foot:     Skin integrity: Dry skin present.     Toenail Condition: Right toenails are normal.     Left foot:     Skin integrity: Dry skin present.     Toenail Condition: Left toenails are abnormally thick.     Comments: Feet smell of ammonia Lymphadenopathy:     Cervical: No cervical adenopathy.  Neurological:     Mental Status: He is alert.     Assessment & Plan   PAD:  asymptomatic.  Discussed the best thing he could do is quit smoking, but he was unwilling to discuss doing so today.  We discussed the likelihood his PAD would continue to progress more rapidly with continued smoking.  To wean and discontinue Metoprolol  to stop his evening dose today and then stop morning dose on Saturday.  2.  COPD/chronic bronchitis/allergies:  In discussion with his granddaughter and caregiver today, will start Trelegy ellipta  one inhalation daily and use albuterol  HFA as needed.  Reminded them he has bottles of Claritin  at home which are not in his pill packs as he uses only as needed.   Discussed CT of lungs as screen for lung cancer and also had CT of abdomen in evaluation for kidney  stone from 06/2022.  Discussed whether he would pursue treatment and that he is not a surgical candidate as well.  Answer currently is no. He will think on this and have conversation with family/granddaughter as to whether he changes his mind to pursue.  However, reminded them if it is unlikely to change what can be done,  would not obtain the study.  3.  CKD and chronic normocytic anemia:  Stable.  Years of poorly controlled BP.  4.  Hypertension with bradycardia:  weaning Metoprolol   5.  Hypercholesterolemia:  controlled  6.  Bilateral cerumen impaction:  set up for disimpaction--Debrox twice daily for the two days prior.  7.  Cerebellar ataxia and deconditioning:  shower chair.

## 2023-11-11 NOTE — Patient Instructions (Addendum)
 Debrox 4 drops each ear twice daily for 2 days prior to returning to cleaning.  Call BP and pulse on Monday.

## 2023-11-12 ENCOUNTER — Other Ambulatory Visit: Payer: Self-pay | Admitting: Internal Medicine

## 2023-11-16 NOTE — Telephone Encounter (Signed)
 Second attempt to contact patient to reschedule appointment.

## 2023-11-17 ENCOUNTER — Ambulatory Visit: Admitting: Internal Medicine

## 2023-11-18 ENCOUNTER — Ambulatory Visit: Admitting: Internal Medicine

## 2023-11-19 ENCOUNTER — Other Ambulatory Visit: Payer: Self-pay

## 2023-11-19 ENCOUNTER — Emergency Department (HOSPITAL_COMMUNITY)

## 2023-11-19 ENCOUNTER — Encounter (HOSPITAL_COMMUNITY): Payer: Self-pay

## 2023-11-19 ENCOUNTER — Observation Stay (HOSPITAL_COMMUNITY)
Admission: EM | Admit: 2023-11-19 | Discharge: 2023-11-22 | Disposition: A | Discharge status: Home / Self Care | Attending: Emergency Medicine | Admitting: Emergency Medicine

## 2023-11-19 DIAGNOSIS — Z8673 Personal history of transient ischemic attack (TIA), and cerebral infarction without residual deficits: Secondary | ICD-10-CM | POA: Insufficient documentation

## 2023-11-19 DIAGNOSIS — I2510 Atherosclerotic heart disease of native coronary artery without angina pectoris: Secondary | ICD-10-CM | POA: Diagnosis not present

## 2023-11-19 DIAGNOSIS — R770 Abnormality of albumin: Secondary | ICD-10-CM | POA: Diagnosis not present

## 2023-11-19 DIAGNOSIS — R262 Difficulty in walking, not elsewhere classified: Secondary | ICD-10-CM | POA: Diagnosis not present

## 2023-11-19 DIAGNOSIS — I130 Hypertensive heart and chronic kidney disease with heart failure and stage 1 through stage 4 chronic kidney disease, or unspecified chronic kidney disease: Secondary | ICD-10-CM | POA: Diagnosis not present

## 2023-11-19 DIAGNOSIS — Z85038 Personal history of other malignant neoplasm of large intestine: Secondary | ICD-10-CM | POA: Diagnosis not present

## 2023-11-19 DIAGNOSIS — K625 Hemorrhage of anus and rectum: Principal | ICD-10-CM | POA: Diagnosis present

## 2023-11-19 DIAGNOSIS — J449 Chronic obstructive pulmonary disease, unspecified: Secondary | ICD-10-CM | POA: Insufficient documentation

## 2023-11-19 DIAGNOSIS — I700 Atherosclerosis of aorta: Secondary | ICD-10-CM | POA: Insufficient documentation

## 2023-11-19 DIAGNOSIS — M1120 Other chondrocalcinosis, unspecified site: Secondary | ICD-10-CM

## 2023-11-19 DIAGNOSIS — I5032 Chronic diastolic (congestive) heart failure: Secondary | ICD-10-CM | POA: Diagnosis not present

## 2023-11-19 DIAGNOSIS — M25562 Pain in left knee: Secondary | ICD-10-CM

## 2023-11-19 DIAGNOSIS — F17210 Nicotine dependence, cigarettes, uncomplicated: Secondary | ICD-10-CM | POA: Diagnosis not present

## 2023-11-19 DIAGNOSIS — E785 Hyperlipidemia, unspecified: Secondary | ICD-10-CM | POA: Insufficient documentation

## 2023-11-19 DIAGNOSIS — D649 Anemia, unspecified: Secondary | ICD-10-CM | POA: Diagnosis not present

## 2023-11-19 DIAGNOSIS — M1711 Unilateral primary osteoarthritis, right knee: Secondary | ICD-10-CM | POA: Insufficient documentation

## 2023-11-19 DIAGNOSIS — N184 Chronic kidney disease, stage 4 (severe): Secondary | ICD-10-CM | POA: Diagnosis present

## 2023-11-19 LAB — CBC WITH DIFFERENTIAL/PLATELET
Abs Immature Granulocytes: 0.02 K/uL (ref 0.00–0.07)
Abs Immature Granulocytes: 0.01 K/uL (ref 0.00–0.07)
Basophils Absolute: 0 K/uL (ref 0.0–0.1)
Basophils Absolute: 0 K/uL (ref 0.0–0.1)
Basophils Relative: 1 %
Basophils Relative: 1 %
Eosinophils Absolute: 0.7 K/uL — ABNORMAL HIGH (ref 0.0–0.5)
Eosinophils Absolute: 0.7 K/uL — ABNORMAL HIGH (ref 0.0–0.5)
Eosinophils Relative: 11 %
Eosinophils Relative: 11 %
HCT: 31.7 % — ABNORMAL LOW (ref 39.0–52.0)
HCT: 29.8 % — ABNORMAL LOW (ref 39.0–52.0)
Hemoglobin: 10.2 g/dL — ABNORMAL LOW (ref 13.0–17.0)
Hemoglobin: 9.3 g/dL — ABNORMAL LOW (ref 13.0–17.0)
Immature Granulocytes: 0 %
Immature Granulocytes: 0 %
Lymphocytes Relative: 10 %
Lymphocytes Relative: 13 %
Lymphs Abs: 0.7 K/uL (ref 0.7–4.0)
Lymphs Abs: 0.8 K/uL (ref 0.7–4.0)
MCH: 28.5 pg (ref 26.0–34.0)
MCH: 27.9 pg (ref 26.0–34.0)
MCHC: 32.2 g/dL (ref 30.0–36.0)
MCHC: 31.2 g/dL (ref 30.0–36.0)
MCV: 88.5 fL (ref 80.0–100.0)
MCV: 89.5 fL (ref 80.0–100.0)
Monocytes Absolute: 0.6 K/uL (ref 0.1–1.0)
Monocytes Absolute: 0.6 K/uL (ref 0.1–1.0)
Monocytes Relative: 9 %
Monocytes Relative: 10 %
Neutro Abs: 4.3 K/uL (ref 1.7–7.7)
Neutro Abs: 4.2 K/uL (ref 1.7–7.7)
Neutrophils Relative %: 69 %
Neutrophils Relative %: 65 %
Platelets: 191 K/uL (ref 150–400)
Platelets: 169 K/uL (ref 150–400)
RBC: 3.58 MIL/uL — ABNORMAL LOW (ref 4.22–5.81)
RBC: 3.33 MIL/uL — ABNORMAL LOW (ref 4.22–5.81)
RDW: 14.8 % (ref 11.5–15.5)
RDW: 14.9 % (ref 11.5–15.5)
WBC: 6.2 K/uL (ref 4.0–10.5)
WBC: 6.3 K/uL (ref 4.0–10.5)
nRBC: 0 % (ref 0.0–0.2)
nRBC: 0 % (ref 0.0–0.2)

## 2023-11-19 LAB — COMPREHENSIVE METABOLIC PANEL WITH GFR
ALT: 9 U/L (ref 0–44)
AST: 16 U/L (ref 15–41)
Albumin: 3 g/dL — ABNORMAL LOW (ref 3.5–5.0)
Alkaline Phosphatase: 87 U/L (ref 38–126)
Anion gap: 10 (ref 5–15)
BUN: 41 mg/dL — ABNORMAL HIGH (ref 8–23)
CO2: 22 mmol/L (ref 22–32)
Calcium: 10.2 mg/dL (ref 8.9–10.3)
Chloride: 104 mmol/L (ref 98–111)
Creatinine, Ser: 2.26 mg/dL — ABNORMAL HIGH (ref 0.61–1.24)
GFR, Estimated: 27 mL/min — ABNORMAL LOW (ref >60)
Glucose, Bld: 94 mg/dL (ref 70–99)
Potassium: 4.3 mmol/L (ref 3.5–5.1)
Sodium: 136 mmol/L (ref 135–145)
Total Bilirubin: 0.9 mg/dL (ref 0.0–1.2)
Total Protein: 7.3 g/dL (ref 6.5–8.1)

## 2023-11-19 LAB — POC OCCULT BLOOD, ED: Fecal Occult Bld: POSITIVE — AB

## 2023-11-19 LAB — TYPE AND SCREEN
ABO/RH(D): A POS
Antibody Screen: NEGATIVE

## 2023-11-19 MED ORDER — AMLODIPINE BESYLATE 5 MG PO TABS
10.0000 mg | ORAL_TABLET | Freq: Every day | ORAL | Status: DC
  Administered 2023-11-20 – 2023-11-22 (×3): 10 mg via ORAL
  Filled 2023-11-19 (×3): qty 2

## 2023-11-19 MED ORDER — ENSURE PLUS HIGH PROTEIN PO LIQD
237.0000 mL | Freq: Two times a day (BID) | ORAL | Status: DC
  Administered 2023-11-20 – 2023-11-22 (×4): 237 mL via ORAL

## 2023-11-19 MED ORDER — OXYCODONE HCL 5 MG PO TABS
5.0000 mg | ORAL_TABLET | Freq: Once | ORAL | Status: AC
  Administered 2023-11-19: 5 mg via ORAL
  Filled 2023-11-19: qty 1

## 2023-11-19 MED ORDER — HYDRALAZINE HCL 25 MG PO TABS
25.0000 mg | ORAL_TABLET | Freq: Two times a day (BID) | ORAL | Status: DC
Start: 2023-11-20 — End: 2023-11-22
  Administered 2023-11-20 – 2023-11-22 (×4): 25 mg via ORAL
  Filled 2023-11-19 (×4): qty 1

## 2023-11-19 MED ORDER — ALBUTEROL SULFATE (2.5 MG/3ML) 0.083% IN NEBU: 2.5000 mg | INHALATION_SOLUTION | Freq: Four times a day (QID) | RESPIRATORY_TRACT | Status: DC | PRN

## 2023-11-19 MED ORDER — ACETAMINOPHEN 500 MG PO TABS
1000.0000 mg | ORAL_TABLET | Freq: Four times a day (QID) | ORAL | Status: DC | PRN
  Administered 2023-11-20: 1000 mg via ORAL
  Filled 2023-11-19: qty 2

## 2023-11-19 MED ORDER — ATORVASTATIN CALCIUM 40 MG PO TABS
80.0000 mg | ORAL_TABLET | Freq: Every day | ORAL | Status: DC
  Administered 2023-11-20 – 2023-11-22 (×3): 80 mg via ORAL
  Filled 2023-11-19 (×3): qty 2

## 2023-11-19 MED ORDER — FUROSEMIDE 20 MG PO TABS
20.0000 mg | ORAL_TABLET | Freq: Every day | ORAL | Status: DC
  Administered 2023-11-20 – 2023-11-22 (×3): 20 mg via ORAL
  Filled 2023-11-19 (×3): qty 1

## 2023-11-19 MED ORDER — OXYCODONE-ACETAMINOPHEN 5-325 MG PO TABS
1.0000 | ORAL_TABLET | Freq: Once | ORAL | Status: AC
  Administered 2023-11-19: 1 via ORAL
  Filled 2023-11-19: qty 1

## 2023-11-19 MED ORDER — DICLOFENAC SODIUM 1 % EX GEL
4.0000 g | Freq: Four times a day (QID) | CUTANEOUS | Status: DC
  Administered 2023-11-19 – 2023-11-22 (×8): 4 g via TOPICAL
  Filled 2023-11-19: qty 100

## 2023-11-19 MED ORDER — ISOSORBIDE MONONITRATE ER 30 MG PO TB24
120.0000 mg | ORAL_TABLET | Freq: Every day | ORAL | Status: DC
  Administered 2023-11-20 – 2023-11-22 (×3): 120 mg via ORAL
  Filled 2023-11-19 (×3): qty 4

## 2023-11-19 MED ORDER — NITROGLYCERIN 0.4 MG SL SUBL: 0.4000 mg | SUBLINGUAL_TABLET | SUBLINGUAL | Status: DC | PRN

## 2023-11-19 MED ORDER — BUDESON-GLYCOPYRROL-FORMOTEROL 160-9-4.8 MCG/ACT IN AERO
2.0000 | INHALATION_SPRAY | Freq: Two times a day (BID) | RESPIRATORY_TRACT | Status: DC
  Administered 2023-11-19 – 2023-11-22 (×6): 2 via RESPIRATORY_TRACT
  Filled 2023-11-19: qty 5.9

## 2023-11-19 MED ORDER — METOPROLOL TARTRATE 25 MG PO TABS: 25.0000 mg | ORAL_TABLET | Freq: Two times a day (BID) | ORAL | Status: DC

## 2023-11-19 NOTE — ED Triage Notes (Signed)
 C/O right sided knee pain 4 days ago. No falls, no trauma. Daughter said pt had a bright red blood bowel movement.
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
## 2023-11-19 NOTE — ED Notes (Signed)
 Pt taken to CT. Received report. Pt reporting some knee pain. Denies abd pain. No other complaints right now.

## 2023-11-19 NOTE — ED Notes (Signed)
Pt resting, no distress noted.

## 2023-11-19 NOTE — Hospital Course (Addendum)
#  Acute on Chronic Anemia #BRBPR #History of cecal adenocarcinoma s/p resection in 2008 Initially presented with bright red blood with bowel movement and was found to be FOBT+ by ED provider. Hgb dropped down to 9.3 from 11.3 about a week before admission. He has a history of cecal adenocarcinoma s/p colonic resection in 2008 as well as normocytic anemia which has actually improved recently per PCP note by Dr. Adella. GI was consulted and signed off recommending conservative management with Anusol  suppositories for possible internal hemorrhoids.  He is not a good candidate for colonoscopy/endoscopy evaluation.  He did not have any bleeding episodes during his hospitalization.  On day of discharge, hemoglobin improved to 10.0 which is around his baseline.  #Suspected pseudogout #Moderate right knee effusion with Baker's cyst #Severe patellofemoral osteoarthritis Patient presented with 1 week of right knee pain and swelling to the point where he is unable to ambulate.  He has a prior history of knee surgery 15 years ago.  Exam showed swelling with some warmth and mild tenderness to right knee.  CT imaging findings showed chronic patellar deformity consistent with prior history of surgery as well as severe patellofemoral osteoarthritis and a moderate knee effusion with Baker's cyst.  There is also meniscal chondrocalcinosis favoring CPPD arthropathy.  Orthopedics was consulted and initially received conservative management with pain control, however, the patient still had pain and received 1 steroid injection.  This severely improved his pain and he was able to ambulate.  Given the patient's history of CKD and potential GI bleed, NSAIDs were avoided.  During his stay he received Voltaren  gel, acetaminophen , oxycodone  immediate release 5 mg.   Chronic Stable Medical Conditions:   #CAD s/p PCI (2015, 2008) #CVA #PAD Patient has reported history of STEMI and CAD found with PCI in 2015 and 2008.  He also  has a reported history of stroke, although I am unable to see any brain imaging or notes regarding this.  Recent arterial ultrasound with ABI showing moderate bilateral peripheral arterial disease.  No obvious neurologic deficits on exam during this hospitalization.  Lipid panel from 11/09/2023 showing LDL at goal < 70. No acute concerns this admission.  We continued his home medications with the exception of aspirin  due to concern for GI bleed.  Aspirin  was restarted upon discharge.  #Chronic Diastolic HF Last echo in 2019 showing normal EF with grade 1 diastolic dysfunction.  Mild LVH.  Sclerotic aortic valve with mild aortic insufficiency.  Patient appeared euvolemic on exam.  No acute concerns during this admission.   #Hypertension BP has been increased to 152/61 during this admission.  Unclear if he has been taking his medications at home.  We continued his home medications.   #CKD4 Creatinine on admission of 2.26 which appears improved from prior values.  Baseline appears to be 2.2-2.3.  No acute concerns during this hospitalization.   #Hypoalbuminemia #Frailty Albumin on admission of 3.0.  Patient appearing frail likely high fall risk due to pain in the knee.  Likely in the setting of poor p.o. intake.  During this admission we gave Ensure protein supplement drinks.   #COPD No acute concerns during this hospitalization.  He uses Trelegy Ellipta  inhaler at home and we do not have this on formulary.  We started him on Breztri  while he was in the hospital.  Switched him back to home inhaler upon discharge.  #HLD We continued his home atorvastatin .

## 2023-11-19 NOTE — ED Notes (Signed)
 Daughter [name] [phone] would like an update asap

## 2023-11-19 NOTE — ED Provider Notes (Signed)
 Acute on chronic knee pain. Getting CT knee. Can't ambulate.  Bright red rectal bleeding today. CBC down 1g/dL Anticipate admission. Physical Exam  BP 134/61 (BP Location: Right Arm)   Pulse 66   Temp 97.6 F (36.4 C)   Resp 16   Ht 5' 8 (1.727 m)   Wt 67.1 kg   SpO2 100%   BMI 22.50 kg/m   Physical Exam  Procedures  Procedures  ED Course / MDM   Clinical Course as of 11/19/23 1524  Fri Nov 19, 2023  1522 Discussed with Dr. Shila who will consult for GI bleeding. Signed out to Dr. Armenta pending CT knee, likely admission [WS]    Clinical Course User Index [WS] Francesca Elsie CROME, MD   Medical Decision Making Amount and/or Complexity of Data Reviewed Labs: ordered. Radiology: ordered.  Risk Prescription drug management.  Consult: Internal medicine teaching service for admission.        Armenta Canning, MD 11/19/23 [phone]

## 2023-11-20 DIAGNOSIS — M1120 Other chondrocalcinosis, unspecified site: Secondary | ICD-10-CM

## 2023-11-20 DIAGNOSIS — K627 Radiation proctitis: Secondary | ICD-10-CM | POA: Diagnosis not present

## 2023-11-20 DIAGNOSIS — Z85038 Personal history of other malignant neoplasm of large intestine: Secondary | ICD-10-CM

## 2023-11-20 DIAGNOSIS — K625 Hemorrhage of anus and rectum: Secondary | ICD-10-CM | POA: Diagnosis not present

## 2023-11-20 DIAGNOSIS — M25562 Pain in left knee: Secondary | ICD-10-CM | POA: Diagnosis not present

## 2023-11-20 LAB — CBC
HCT: 32.5 % — ABNORMAL LOW (ref 39.0–52.0)
Hemoglobin: 10.3 g/dL — ABNORMAL LOW (ref 13.0–17.0)
MCH: 28.3 pg (ref 26.0–34.0)
MCHC: 31.7 g/dL (ref 30.0–36.0)
MCV: 89.3 fL (ref 80.0–100.0)
Platelets: 152 K/uL (ref 150–400)
RBC: 3.64 MIL/uL — ABNORMAL LOW (ref 4.22–5.81)
RDW: 14.9 % (ref 11.5–15.5)
WBC: 6.5 K/uL (ref 4.0–10.5)
nRBC: 0 % (ref 0.0–0.2)

## 2023-11-20 LAB — BASIC METABOLIC PANEL WITH GFR
Anion gap: 7 (ref 5–15)
BUN: 43 mg/dL — ABNORMAL HIGH (ref 8–23)
CO2: 22 mmol/L (ref 22–32)
Calcium: 9.7 mg/dL (ref 8.9–10.3)
Chloride: 104 mmol/L (ref 98–111)
Creatinine, Ser: 2.17 mg/dL — ABNORMAL HIGH (ref 0.61–1.24)
GFR, Estimated: 28 mL/min — ABNORMAL LOW (ref >60)
Glucose, Bld: 98 mg/dL (ref 70–99)
Potassium: 4 mmol/L (ref 3.5–5.1)
Sodium: 133 mmol/L — ABNORMAL LOW (ref 135–145)

## 2023-11-20 MED ORDER — HYDROCORTISONE ACETATE 25 MG RE SUPP
25.0000 mg | Freq: Two times a day (BID) | RECTAL | Status: AC
Start: 2023-11-20 — End: 2023-11-22
  Administered 2023-11-20 – 2023-11-21 (×3): 25 mg via RECTAL
  Filled 2023-11-20 (×4): qty 1

## 2023-11-20 NOTE — Progress Notes (Signed)
 SCD placed per order.
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
## 2023-11-20 NOTE — Evaluation (Signed)
 Occupational Therapy Evaluation and Discharge Patient Details Name: [name] MRN: [id number] DOB: November 20, 1930 Today's Date: 11/20/2023   History of Present Illness   Pt is a 89 year old man admitted on 11/19/23 with acute on chronic R knee pain and BRBPR. R knee imaging + for severe patellofemorl OA, effusion, baker's cyst, CPPD. PMH: HTN, CKD, cecal colon CA s/p R hemicolectomy, CAD s/p stenting, CVA, hyperparathyroidism, COVID 19.     Clinical Impressions Pt lives alone and has assistance of family members nearly 24 hours who assist with transfers to his power w/c, ADLs and IADLs. Pt self feeds and participates in grooming and dressing as he is able. Pt presents with significant R knee pain and inability to tolerate weight. Requires supervision for bed mobility, stood and transferred with +2 min assist. Pt is likely at his baseline in ADLs with no further OT needs. Pt is well equipped with DME at home.      If plan is discharge home, recommend the following:   A lot of help with walking and/or transfers;A lot of help with bathing/dressing/bathroom;Assistance with cooking/housework;Assist for transportation;Help with stairs or ramp for entrance     Functional Status Assessment   Patient has had a recent decline in their functional status and demonstrates the ability to make significant improvements in function in a reasonable and predictable amount of time.     Equipment Recommendations   None recommended by OT     Recommendations for Other Services         Precautions/Restrictions   Precautions Precautions: Fall Recall of Precautions/Restrictions: Intact Restrictions Weight Bearing Restrictions Per Provider Order: No     Mobility Bed Mobility Overal bed mobility: Needs Assistance Bed Mobility: Supine to Sit     Supine to sit: Supervision          Transfers Overall transfer level: Needs assistance Equipment used: Rolling walker (2 wheels), 2 person  hand held assist Transfers: Sit to/from Stand, Bed to chair/wheelchair/BSC Sit to Stand: +2 physical assistance, Min assist Stand pivot transfers: +2 physical assistance, Min assist         General transfer comment: assist to rise and steady with RW to use urinal, pivoted to chair from bed with B HHA, pt unable to tolerate weight on R LE      Balance Overall balance assessment: Needs assistance   Sitting balance-Leahy Scale: Good       Standing balance-Leahy Scale: Poor                             ADL either performed or assessed with clinical judgement   ADL Overall ADL's : At baseline                                             Vision Ability to See in Adequate Light: 0 Adequate Patient Visual Report: No change from baseline       Perception         Praxis         Pertinent Vitals/Pain Pain Assessment Pain Assessment: Faces Faces Pain Scale: Hurts whole lot Pain Location: R knee Pain Descriptors / Indicators: Discomfort, Grimacing, Guarding Pain Intervention(s): Monitored during session, Repositioned     Extremity/Trunk Assessment Upper Extremity Assessment Upper Extremity Assessment: Right hand dominant;Generalized weakness;RUE deficits/detail RUE Deficits / Details: Had a  wrist surgery and hands are contracted--longstanding RUE Coordination: decreased fine motor   Lower Extremity Assessment Lower Extremity Assessment: Defer to PT evaluation       Communication Communication Communication: Impaired Factors Affecting Communication: Reduced clarity of speech (low volume)   Cognition Arousal: Alert Behavior During Therapy: WFL for tasks assessed/performed Cognition: No apparent impairments                               Following commands: Intact       Cueing  General Comments   Cueing Techniques: Verbal cues      Exercises     Shoulder Instructions      Home Living Family/patient expects to  be discharged to:: Private residence Living Arrangements: Alone Available Help at Discharge: Family;Available PRN/intermittently (nearly 24 hours) Type of Home: Apartment Home Access: Level entry     Home Layout: One level     Bathroom Shower/Tub: Chief Strategy Officer: Standard Bathroom Accessibility: Yes   Home Equipment: Wheelchair - power;Rollator (4 wheels);Hand held shower head;Grab bars - tub/shower;Shower seat;BSC/3in1          Prior Functioning/Environment Prior Level of Function : Needs assist             Mobility Comments: transfers with assist, uses power w/c for mobility ADLs Comments: Daughter cooks, brings groceries, cleans, helps with showers, son stays at night.    OT Problem List: Impaired balance (sitting and/or standing);Pain   OT Treatment/Interventions:        OT Goals(Current goals can be found in the care plan section)       OT Frequency:       Co-evaluation PT/OT/SLP Co-Evaluation/Treatment: Yes Reason for Co-Treatment: For patient/therapist safety PT goals addressed during session: Mobility/safety with mobility;Balance;Proper use of DME;Strengthening/ROM OT goals addressed during session: ADL's and self-care      AM-PAC OT 6 Clicks Daily Activity     Outcome Measure Help from another person eating meals?: A Little Help from another person taking care of personal grooming?: A Little Help from another person toileting, which includes using toliet, bedpan, or urinal?: A Lot Help from another person bathing (including washing, rinsing, drying)?: A Lot Help from another person to put on and taking off regular upper body clothing?: A Little Help from another person to put on and taking off regular lower body clothing?: A Lot 6 Click Score: 15   End of Session Equipment Utilized During Treatment: Rolling walker (2 wheels);Gait belt Nurse Communication: Mobility status  Activity Tolerance: Patient limited by  pain Patient left: in chair;with call bell/phone within reach;with chair alarm set  OT Visit Diagnosis: Pain;Unsteadiness on feet (R26.81) Pain - Right/Left: Right Pain - part of body: Knee                Time: 0828-0900 OT Time Calculation (min): 32 min Charges:  OT General Charges $OT Visit: 1 Visit OT Evaluation $OT Eval Moderate Complexity: 1 Mod  Mliss HERO, OTR/L Acute Rehabilitation Services Office: [phone]   Kennth Mliss Helling 11/20/2023, 9:27 AM
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
## 2023-11-20 NOTE — Plan of Care (Signed)
  Problem: Skin Integrity: Goal: Risk for impaired skin integrity will decrease Outcome: Progressing   Problem: Safety: Goal: Ability to remain free from injury will improve Outcome: Progressing   Problem: Pain Managment: Goal: General experience of comfort will improve and/or be controlled Outcome: Progressing   Problem: Education: Goal: Knowledge of General Education information will improve Description: Including pain rating scale, medication(s)/side effects and non-pharmacologic comfort measures Outcome: Progressing

## 2023-11-20 NOTE — Progress Notes (Signed)
 HD#0 SUBJECTIVE:  Patient Summary: [name] is a 89 y.o. with a pertinent PMH of CVA, colonic adenocarcinoma s/p resection (2008), CAD s/p PCI (2015, 2008), Chronic diastolic HF, Prostate cancer, CKD4, R knee surgery, HTN, and gout who presented with hematochezia and admitted for BRBPR.   Overnight Events: NAEON  Interim History: States that his pain is much better this morning. Has not had a bowel movement since admission. He is not having any other blood since yesterday morning.  OBJECTIVE:  Vital Signs: Vitals:   11/19/23 2004 11/20/23 0522 11/20/23 0819 11/20/23 0911  BP: (!) 145/72 (!) 149/66  (!) 147/68  Pulse: 61 (!) 55  65  Resp: 16 19  16   Temp: (!) 97.5 F (36.4 C) 98.3 F (36.8 C)  98.3 F (36.8 C)  TempSrc: Oral Oral  Oral  SpO2: 100% 100% 100% 100%  Weight:      Height:        Filed Weights   11/19/23 1143  Weight: 67.1 kg     Intake/Output Summary (Last 24 hours) at 11/20/2023 1127 Last data filed at 11/20/2023 0240 Gross per 24 hour  Intake 120 ml  Output 400 ml  Net -280 ml   Net IO Since Admission: -280 mL [11/20/23 1127]  Physical Exam: Constitutional: chronically ill-appearing, undernourished male lying in hospital bed in no acute distress HENT: normocephalic atraumatic, mucous membranes moist Eyes: conjunctiva non-erythematous, PERRL, no scleral icterus Neck: supple without lesions, thyroid non-enlarged and non-tender Cardiovascular: regular rate and rhythm, no m/r/g Pulmonary/Chest: normal work of breathing on room air, rhonchi heard diffusely with left > right Abdominal: soft, non-tender, non-distended, bowel sounds normal MSK: moderate swelling and warmth of right knee compared to left without erythema or tenderness to palpation; transverse scar noted to mid-patella of right knee; left knee appears grossly normal Neurological: alert & oriented x3, able to roll onto side and hold onto bed rails Skin: warm and dry Extremities: no edema or  cyanosis; peripheral pulses intact Psych: normal mood and affect, thought content normal  Patient Lines/Drains/Airways Status     Active Line/Drains/Airways     Name Placement date Placement time Site Days   Peripheral IV 11/19/23 20 G Anterior;Right Forearm 11/19/23  1242  Forearm  1            Pertinent labs and imaging:     Latest Ref Rng & Units 11/20/2023    4:10 AM 11/19/2023    2:11 PM 11/19/2023   12:38 PM  CBC  WBC 4.0 - 10.5 K/uL 6.5  6.3  6.2   Hemoglobin 13.0 - 17.0 g/dL 89.6  9.3  89.7   Hematocrit 39.0 - 52.0 % 32.5  29.8  31.7   Platelets 150 - 400 K/uL 152  169  191        Latest Ref Rng & Units 11/19/2023   12:38 PM 11/09/2023    9:11 AM 10/23/2022    9:32 AM  CMP  Glucose 70 - 99 mg/dL 94  80  82   BUN 8 - 23 mg/dL 41  54  53   Creatinine 0.61 - 1.24 mg/dL 7.73  7.85  7.62   Sodium 135 - 145 mmol/L 136  139  138   Potassium 3.5 - 5.1 mmol/L 4.3  4.9  4.3   Chloride 98 - 111 mmol/L 104  106  104   CO2 22 - 32 mmol/L 22  CANCELED  19   Calcium  8.9 - 10.3 mg/dL 10.2  10.8  10.9   Total Protein 6.5 - 8.1 g/dL 7.3  8.5  7.7   Total Bilirubin 0.0 - 1.2 mg/dL 0.9  0.5  0.4   Alkaline Phos 38 - 126 U/L 87  114  92   AST 15 - 41 U/L 16  20  10    ALT 0 - 44 U/L 9  9  5      CT Knee Right Wo Contrast Result Date: 11/19/2023 CLINICAL DATA:  Right knee pain over the last 4 days. Knee effusion. EXAM: CT OF THE RIGHT KNEE WITHOUT CONTRAST TECHNIQUE: Multidetector CT imaging of the right knee was performed according to the standard protocol. Multiplanar CT image reconstructions were also generated. RADIATION DOSE REDUCTION: This exam was performed according to the departmental dose-optimization program which includes automated exposure control, adjustment of the mA and/or kV according to patient size and/or use of iterative reconstruction technique. COMPARISON:  Radiographs 11/19/2023 FINDINGS: Bones/Joint/Cartilage Meniscal chondrocalcinosis along with chondrocalcinosis  along the proximal popliteus tendon. Deformity in the upper half of the patella with prominent bony volume loss and scalloped confluent erosions and cyst-like appearance along the upper margin. Thickened and broadened distal quadriceps tendon attaching in the vicinity of this irregular upper patellar margin with scattered calcifications and small ossifications within the quadriceps tendon. Much of this appears to be chronic with a roughly similar although less severe appearance on 02/28/2022. Correlate with history of patellar injury or surgery in the past. Large geodes or degenerative subcortical cysts along the femoral trochlear groove, especially along the medial facet but also along the lateral facet. Full-thickness loss of patellofemoral articular cartilage compatible with severe osteoarthritis. No fracture or acute bony findings identified. Moderate knee effusion. Ligaments Suboptimally assessed by CT. Muscles and Tendons Expanded and broadened distal quadriceps tendon with internal calcifications and ossifications as noted above. Probably this may be a manifestation of CPPD arthropathy and chronic fragmentation of upper patellar spurring. Soft tissues Small to moderate size Baker's cyst. Atherosclerosis. Borderline thickened iliotibial band. IMPRESSION: 1. Chronic deformity in the upper half of the patella with prominent bony volume loss and scalloped confluent erosions and cyst-like appearance along the upper margin. Thickened and broadened distal quadriceps tendon attaching in the vicinity of this irregular upper patellar margin with scattered calcifications and small ossifications within the quadriceps tendon. Much of this appears to be chronic with a roughly similar although less severe appearance on 02/28/2022. Correlate with history of patellar injury or surgery in the past. 2. Severe patellofemoral osteoarthritis. 3. Moderate knee effusion with small to moderate size Baker's cyst. 4. Meniscal  chondrocalcinosis along with chondrocalcinosis along the proximal popliteus tendon. Appearance favors CPPD arthropathy. 5. Atherosclerosis. 6. Borderline thickened iliotibial band. Electronically Signed   By: Ryan Salvage M.D.   On: 11/19/2023 15:49   DG Knee Complete 4 Views Right Result Date: 11/19/2023 CLINICAL DATA:  Four day history of right-sided knee pain. No known injury. EXAM: RIGHT KNEE - COMPLETE 4 VIEW COMPARISON:  Right knee radiograph dated 02/28/2022 FINDINGS: There are no findings of fracture or dislocation. Moderate joint effusion. Similar degenerative changes of the knee, most pronounced in the patellofemoral compartment with similar fragmentation at the insertion of the quadriceps tendon. Small focus of the cortical lucency involving the lateral aspect of the medial femoral condyle. Soft tissues are unremarkable. IMPRESSION: 1. Moderate joint effusion. 2. Small focus of cortical lucency involving the lateral aspect of the medial femoral condyle, which may represent an osteochondral defect. 3. Similar degenerative changes of the knee,  most pronounced in the patellofemoral compartment. Electronically Signed   By: Limin  Xu M.D.   On: 11/19/2023 13:19    ASSESSMENT/PLAN:  Assessment: Principal Problem:   Bright red blood per rectum Active Problems:   Chronic kidney disease (CKD), stage IV (severe) (HCC)   Calcium  pyrophosphate deposition disease (CPPD)   Plan: #Acute on Chronic Anemia #BRBPR #History of cecal adenocarcinoma s/p resection in 2008 Patient reporting no bleeding since yesterday. Hgb improved to 10.3 without any interventions other than holding home medications. Patient has history of long-standing normocytic anemia which was actually improved per recent PCP note by Dr. Adella. Patient without symptoms from anemia.  Differential is broad and includes internal hemorrhoids, anal fissure, diverticulosis, and arteriovenous malformations. Also could be due to  radiation proctitis as patient previously had radiation treatment for prostate cancer. Recurrence of cecal adenocarcinoma is less likely given time course of symptoms. GI was consulted by the ED.  The patient may benefit from further evaluation with colonoscopy.  Will follow-up with GI recommendations. - GI consulted:  - trend CBCs  - Anusol  HC suppository bid  - No plans for endoscopy at this time - Hold home aspirin    #Moderate right knee effusion with Baker's cyst #Severe patellofemoral osteoarthritis Patient reports pain is improved with pain regimen.  Exam unchanged from yesterday.  CT imaging findings with chronic patellar deformity consistent with surgery; severe patellofemoral osteoarthritis; moderate knee effusion with Baker's cyst; meniscal chondrocalcinosis favoring CPPD arthropathy.  Unclear etiology but may be in the setting of CPPD, worsening of chronic osteoarthritis, or gout.  Doubt infection due to normal WBC, vitals wnl, and lack of concordant exam findings.  Received 5mg  oxycodone  in the ED.  Consulted orthopedics as patient may benefit from knee aspiration and synovial fluid analysis with intra-articular steroids.  - Orthopedics consulted, appreciate recs - Pain control: - Voltaren  gel as needed - Acetaminophen  1000 mg every 6 as needed - Oxycodone  IR 5 mg once - PT/OT Evaluation   Chronic Stable Medical Conditions:   #CAD s/p PCI (2015, 2008) #CVA #PAD Patient still not reporting any chest pain.  Patient has reported history of STEMI and CAD found with PCI in 2015 and 2008.  He also has a reported history of stroke, although I am unable to see any brain imaging or notes regarding this.  Recent arterial ultrasound with ABI showing moderate bilateral peripheral arterial disease.  No obvious neurologic deficits on exam.  Lipid panel from 11/09/2023 showing LDL at goal < 70. No acute concerns this admission.  Will continue with home medications minus his aspirin  due to bleeding  concerns. - Continue atorvastatin  80 mg daily - Continue Imdur  120 mg daily - Hold aspirin  81 mg daily in the setting of bleeding - Continue nitroglycerin  0.4 mg sublingual tablet as needed   #Chronic Diastolic HF Last echo in 2019 showing normal EF with grade 1 diastolic dysfunction.  Mild LVH.  Sclerotic aortic valve with mild aortic insufficiency.  Patient appears euvolemic on exam.  No acute concerns during this admission. - Continue Lasix  20 mg daily   #Hypertension BP has been increased to 152/61 during this admission.  Unclear if he has been taking his medications at home.  Will continue home medications. - Amlodipine  10 mg daily - Hydralazine  25 mg twice daily   #CKD4 Creatinine on admission of 2.26 which appears improved from prior values.  Baseline appears to be 2.2-2.3.  No acute concerns. - Trend BMPs   #Hypoalbuminemia #Frailty Albumin on admission  of 3.0.  Patient appearing frail likely high fall risk due to pain in the knee.  Likely in the setting of poor p.o. intake. - Ensure protein supplements  #Lung Nodule Per chart review, the patient had previous incidental 13X 11 mm ground-glass opacity in 2024 with recommendations to follow-up with repeat CT scan in the outpatient setting.  This appears to not have been completed.  Will reemphasize to the patient about completing CT chest.   #COPD Patient not complaining of dyspnea at rest.  Exam showing rhonchorous breath sounds on left worse than right.  Uses Trelegy Ellipta  inhaler at home.  Recent PCP note explains that he has been using this. - Breztri  inhaler while inpatient - Albuterol  inhaler as needed   #HLD - continue atorvastatin  80 mg daily  Best Practice: Diet: Regular IVF: Fluids: None, Rate: None VTE: SCDs Start: 11/19/23 1732 Code: DNR/DNI  Disposition planning: Therapy Recs: Pending, DME: pending Family Contact: [name] (daughter, number in chart), to be notified. DISPO: Anticipated discharge in 1-2  days pending clinical improvement.  Signature:  Letha Cheadle, MD [facility]  PGY-1 11/20/2023, 11:27 AM  On Call pager [phone]

## 2023-11-20 NOTE — Progress Notes (Signed)
 9/13 Patient gave consent to speak to daughter [name] Oman @[phone], verbal consent was given telephonically.

## 2023-11-21 DIAGNOSIS — K625 Hemorrhage of anus and rectum: Secondary | ICD-10-CM | POA: Diagnosis not present

## 2023-11-21 LAB — CBC
HCT: 27.8 % — ABNORMAL LOW (ref 39.0–52.0)
Hemoglobin: 8.8 g/dL — ABNORMAL LOW (ref 13.0–17.0)
MCH: 27.8 pg (ref 26.0–34.0)
MCHC: 31.7 g/dL (ref 30.0–36.0)
MCV: 88 fL (ref 80.0–100.0)
Platelets: 171 K/uL (ref 150–400)
RBC: 3.16 MIL/uL — ABNORMAL LOW (ref 4.22–5.81)
RDW: 14.8 % (ref 11.5–15.5)
WBC: 6.3 K/uL (ref 4.0–10.5)
nRBC: 0 % (ref 0.0–0.2)

## 2023-11-21 MED ORDER — LIDOCAINE HCL 1 % IJ SOLN
4.0000 mL | Freq: Once | INTRAMUSCULAR | Status: DC
Start: 2023-11-21 — End: 2023-11-21
  Filled 2023-11-21: qty 4

## 2023-11-21 MED ORDER — LIDOCAINE HCL (PF) 1 % IJ SOLN
4.0000 mL | Freq: Once | INTRAMUSCULAR | Status: AC
  Administered 2023-11-21: 4 mL
  Filled 2023-11-21: qty 4

## 2023-11-21 MED ORDER — DEXAMETHASONE SODIUM PHOSPHATE 10 MG/ML IJ SOLN
2.0000 mg | Freq: Once | INTRAMUSCULAR | Status: AC
  Administered 2023-11-21: 2 mg via INTRA_ARTICULAR
  Filled 2023-11-21: qty 1

## 2023-11-21 NOTE — Progress Notes (Signed)
.  Subjective: Patient states knee pain and swelling slightly better today. No fevers overnight  Activity level:  WBAT Patient reports pain as moderate.    Objective: Vital signs in last 24 hours: Temp:  [98 F (36.7 C)-98.6 F (37 C)] 98.6 F (37 C) (09/14 0756) Pulse Rate:  [55-73] 55 (09/14 0756) Resp:  [16-20] 20 (09/14 0756) BP: (96-131)/(43-60) 130/59 (09/14 0756) SpO2:  [97 %-100 %] 100 % (09/14 0756)  Labs: Recent Labs    11/19/23 1238 11/19/23 1411 11/20/23 0410 11/21/23 0139  HGB 10.2* 9.3* 10.3* 8.8*   Recent Labs    11/20/23 0410 11/21/23 0139  WBC 6.5 6.3  RBC 3.64* 3.16*  HCT 32.5* 27.8*  PLT 152 171   Recent Labs    11/19/23 1238 11/20/23 1323  NA 136 133*  K 4.3 4.0  CL 104 104  CO2 22 22  BUN 41* 43*  CREATININE 2.26* 2.17*  GLUCOSE 94 98  CALCIUM  10.2 9.7   No results for input(s): LABPT, INR in the last 72 hours.  Physical Exam:  Neurologically intact Neurovascular intact Sensation intact distally Intact pulses distally Dorsiflexion/Plantar flexion intact No cellulitis present Knee effusion improved, ROM slightly improved  Assessment/Plan: 89 y.o. male with the following: Right knee pain likely due to acute exacerbation of severe osteoarthritis vs inflammatory arthropathy (gout vs psuedogout)   Low suspicion of septic arthritis given lack of leukocytosis or fever and no pain with micromotion   This patient also being evaluated for bloody stools   - Weight Bearing Status/Activity: WBAT   - PT/OT evaluation   -VTE Prophylaxis: no orthopaedic contraindication   - Pain control: per primary    PROCEUDURE: Risks and benefits of intra-articular corticosteroid injection discussed with patient. Patient understands and wished to proceed with injection. Under sterile conditions the right knee was injected with 1cc of dexamethasone  and 5 cc of 1% lidocaine . The patient tolerated the procedure well.   [name] Mon 11/21/2023,  11:17 AM

## 2023-11-21 NOTE — Plan of Care (Signed)

## 2023-11-21 NOTE — Progress Notes (Signed)
 HD#0 Subjective:   Summary: This is a 89 year old male with a past medical history of prior CVA, colonic adenocarcinoma status post resection in 2008, CAD status post PCI in 2015 and 2008, HFpEF who presents with concerns of bright red blood per rectum and admitted for further evaluation and management.  Overnight Events: No acute events overnight.  Patient evaluated at bedside this morning.  He reports he is doing well.  He has no concerns.  He had a bowel movement with no blood.  He reports having right knee pain.  He is unable to put any weight on his knee.  Objective:  Vital signs in last 24 hours: Vitals:   11/20/23 1227 11/20/23 1542 11/20/23 1940 11/21/23 0406  BP: (!) 131/54 (!) 96/43 (!) 107/56 124/60  Pulse: 65 73 66 65  Resp: 18 18 16 16   Temp: 98 F (36.7 C) 98.1 F (36.7 C) 98.2 F (36.8 C) 98.5 F (36.9 C)  TempSrc: Oral Oral Oral Oral  SpO2: 100% 97% 100% 99%  Weight:      Height:       Supplemental O2: Room Air SpO2: 99 %   Physical Exam:  Constitutional: Chronically ill-appearing, resting in bed, no acute distress HENT: normocephalic atraumatic Cardiovascular: regular rate and rhythm, no m/r/g Pulmonary/Chest: normal work of breathing on room air, lungs clear to auscultation bilaterally Abdominal: soft, non-tender, non-distended Extremities: Right knee with effusion appreciated.  4/5 strength noted to right lower extremity on knee flexion and knee extension  Filed Weights   11/19/23 1143  Weight: 67.1 kg     Intake/Output Summary (Last 24 hours) at 11/21/2023 0601 Last data filed at 11/21/2023 0130 Gross per 24 hour  Intake --  Output 550 ml  Net -550 ml   Net IO Since Admission: -830 mL [11/21/23 0601]  Pertinent Labs:    Latest Ref Rng & Units 11/21/2023    1:39 AM 11/20/2023    4:10 AM 11/19/2023    2:11 PM  CBC  WBC 4.0 - 10.5 K/uL 6.3  6.5  6.3   Hemoglobin 13.0 - 17.0 g/dL 8.8  89.6  9.3   Hematocrit 39.0 - 52.0 % 27.8  32.5  29.8    Platelets 150 - 400 K/uL 171  152  169        Latest Ref Rng & Units 11/20/2023    1:23 PM 11/19/2023   12:38 PM 11/09/2023    9:11 AM  CMP  Glucose 70 - 99 mg/dL 98  94  80   BUN 8 - 23 mg/dL 43  41  54   Creatinine 0.61 - 1.24 mg/dL 7.82  7.73  7.85   Sodium 135 - 145 mmol/L 133  136  139   Potassium 3.5 - 5.1 mmol/L 4.0  4.3  4.9   Chloride 98 - 111 mmol/L 104  104  106   CO2 22 - 32 mmol/L 22  22  CANCELED   Calcium  8.9 - 10.3 mg/dL 9.7  89.7  89.1   Total Protein 6.5 - 8.1 g/dL  7.3  8.5   Total Bilirubin 0.0 - 1.2 mg/dL  0.9  0.5   Alkaline Phos 38 - 126 U/L  87  114   AST 15 - 41 U/L  16  20   ALT 0 - 44 U/L  9  9     Imaging: No results found.  Assessment/Plan:   Principal Problem:   Bright red blood per rectum Active Problems:  Chronic kidney disease (CKD), stage IV (severe) (HCC)   Calcium  pyrophosphate deposition disease (CPPD)   Left knee pain   Patient Summary: [name] is a 89 y.o. male with a past medical history of prior CVA, colonic adenocarcinoma status post resection in 2008, CAD status post PCI in 2015 and 2008, HFpEF who presents with concerns of bright red blood per rectum and admitted for further evaluation and management.  #Acute on chronic anemia #Bright red blood per rectum #History of cecal adenocarcinoma s/p resection in 2008 GI has signed off.  No further bleeding appreciated from patient.  Patient is getting Anusol  suppositories.  Hemoglobin this morning is stable at 8.8.  Will continue to monitor. -Continue Anusol  suppositories - GI signed off - Holding home aspirin   #Right knee effusion #CPPD flare #Severe patellofemoral osteoarthritis Likely, pain is multifactorial.  Patient has not been able to put weight on his lower extremity.  Patient would benefit from intra-articular injection.  Will reach out to Ortho to see if patient can get that today as they recommended if not improved, can get it today. - Reach out to orthopedics  today - Continue Voltaren  gel - Continue Tylenol  - Continue oxycodone   #HFpEF No acute concerns at this time.  Euvolemic. - Continue Lasix  20 mg daily  #Hypertension Blood pressure currently ranging between high 90s-140s systolics.  Patient currently on amlodipine  10 mg daily, hydralazine  25 mg twice daily - Continue Imdur  120 mg daily Continue hydralazine  25 mg twice daily - Continue amlodipine  10 mg daily  #CKD4 Creatinine on admission of 2.17 which appears improved from prior values.  Baseline appears to be 2.2-2.3.  No acute concerns. - Trend BMPs   #Hypoalbuminemia #Frailty Albumin on admission of 3.0.  Patient appearing frail likely high fall risk due to pain in the knee.  Likely in the setting of poor p.o. intake. - Ensure protein supplements   #Lung Nodule Per chart review, the patient had previous incidental 13X 11 mm ground-glass opacity in 2024 with recommendations to follow-up with repeat CT scan in the outpatient setting.  This appears to not have been completed.  Will reemphasize to the patient about completing CT chest.   #COPD No acute concerns.  Trelegy at home, but not on formulary. - Continue Breztri  while here  Diet: Normal IVF: None,None VTE: SCDs Code: DNR PT/OT recs: None, none.  Dispo: Anticipated discharge to Home in 2 days pending clinical improvement.   Libby Blanch DO Internal Medicine Resident PGY-3 Please contact the on call pager after 5 pm and on weekends at [phone]

## 2023-11-22 ENCOUNTER — Other Ambulatory Visit (HOSPITAL_COMMUNITY): Payer: Self-pay

## 2023-11-22 DIAGNOSIS — M25562 Pain in left knee: Secondary | ICD-10-CM | POA: Diagnosis not present

## 2023-11-22 DIAGNOSIS — Z9049 Acquired absence of other specified parts of digestive tract: Secondary | ICD-10-CM

## 2023-11-22 DIAGNOSIS — K625 Hemorrhage of anus and rectum: Secondary | ICD-10-CM | POA: Diagnosis not present

## 2023-11-22 DIAGNOSIS — Z85038 Personal history of other malignant neoplasm of large intestine: Secondary | ICD-10-CM | POA: Diagnosis not present

## 2023-11-22 LAB — BASIC METABOLIC PANEL WITH GFR
Anion gap: 10 (ref 5–15)
BUN: 61 mg/dL — ABNORMAL HIGH (ref 8–23)
CO2: 21 mmol/L — ABNORMAL LOW (ref 22–32)
Calcium: 10.6 mg/dL — ABNORMAL HIGH (ref 8.9–10.3)
Chloride: 103 mmol/L (ref 98–111)
Creatinine, Ser: 2.56 mg/dL — ABNORMAL HIGH (ref 0.61–1.24)
GFR, Estimated: 23 mL/min — ABNORMAL LOW (ref >60)
Glucose, Bld: 111 mg/dL — ABNORMAL HIGH (ref 70–99)
Potassium: 4.6 mmol/L (ref 3.5–5.1)
Sodium: 134 mmol/L — ABNORMAL LOW (ref 135–145)

## 2023-11-22 LAB — CBC
HCT: 30.9 % — ABNORMAL LOW (ref 39.0–52.0)
Hemoglobin: 10 g/dL — ABNORMAL LOW (ref 13.0–17.0)
MCH: 28.1 pg (ref 26.0–34.0)
MCHC: 32.4 g/dL (ref 30.0–36.0)
MCV: 86.8 fL (ref 80.0–100.0)
Platelets: 197 K/uL (ref 150–400)
RBC: 3.56 MIL/uL — ABNORMAL LOW (ref 4.22–5.81)
RDW: 14.6 % (ref 11.5–15.5)
WBC: 11.2 K/uL — ABNORMAL HIGH (ref 4.0–10.5)
nRBC: 0 % (ref 0.0–0.2)

## 2023-11-22 MED ORDER — DICLOFENAC SODIUM 1 % EX GEL
4.0000 g | Freq: Four times a day (QID) | CUTANEOUS | 0 refills | Status: AC
  Filled 2023-11-22: qty 300, 19d supply, fill #0

## 2023-11-22 MED ORDER — ACETAMINOPHEN 500 MG PO TABS
1000.0000 mg | ORAL_TABLET | Freq: Four times a day (QID) | ORAL | 0 refills | Status: AC | PRN
  Filled 2023-11-22: qty 30, 4d supply, fill #0

## 2023-11-22 MED ORDER — HYDROCORTISONE ACETATE 25 MG RE SUPP
25.0000 mg | Freq: Two times a day (BID) | RECTAL | 0 refills | Status: AC
  Filled 2023-11-22: qty 10, 5d supply, fill #0

## 2023-11-22 NOTE — Discharge Summary (Signed)
 Name: [name] MRN: [id number] DOB: 1930/07/29 89 y.o. PCP: Adella Norris, MD  Date of Admission: 11/19/2023 11:37 AM Date of Discharge: 11/22/2023  Attending Physician: Dr. MICAEL Riis Winfrey  Discharge Diagnosis: Principal Problem:   Bright red blood per rectum Active Problems:   Chronic kidney disease (CKD), stage IV (severe) (HCC)   Calcium  pyrophosphate deposition disease (CPPD)   Left knee pain Hypoalbuminemia Hyperlipidemia COPD Chronic diastolic heart failure CAD s/p PCI CVA PAD Hyponatremia NAGMA Hypercalcemia  Discharge Medications: Allergies as of 11/22/2023   No Known Allergies      Medication List     TAKE these medications    Acetaminophen  Extra Strength 500 MG Tabs Take 2 tablets (1,000 mg total) by mouth every 6 (six) hours as needed for mild pain (pain score 1-3).   albuterol  108 (90 Base) MCG/ACT inhaler Commonly known as: ProAir  HFA Inhale 2 puffs into the lungs every 6 (six) hours as needed for wheezing or shortness of breath.   amLODipine  10 MG tablet Commonly known as: NORVASC  TAKE 1 TABLET BY MOUTH EVERY DAY WITH evening meal   Aspirin  EC Adult Low Dose 81 MG tablet Generic drug: aspirin  EC TAKE 1 TABLET BY MOUTH EVERY MORNING WITH A meal   atorvastatin  80 MG tablet Commonly known as: LIPITOR  TAKE 1 TABLET BY MOUTH EVERY DAY with EVENING MEAL   diclofenac  Sodium 1 % Gel Commonly known as: VOLTAREN  Apply 4 g topically 4 (four) times daily.   furosemide  20 MG tablet Commonly known as: LASIX  TAKE 1 TABLET BY MOUTH ONCE DAILY with morning meal   hydrALAZINE  25 MG tablet Commonly known as: APRESOLINE  TAKE 1 TABLET BY MOUTH 2 TIMES DAILY with morning and evening meals   hydrocortisone  25 MG suppository Commonly known as: ANUSOL -HC Unwrap and place 1 suppository (25 mg total) rectally 2 (two) times daily.   isosorbide  mononitrate 120 MG 24 hr tablet Commonly known as: IMDUR  TAKE 1 TABLET BY MOUTH EVERY DAY WITH  DINNER What changed:  how much to take how to take this when to take this additional instructions   loratadine  10 MG tablet Commonly known as: CLARITIN  TAKE 1 TABLET BY MOUTH ONCE DAILY AS NEEDED FOR ALLERGY SYMPTOMS   nitroGLYCERIN  0.4 MG SL tablet Commonly known as: NITROSTAT  PLACE 1 TABLET UNDER THE TONGUE EVERY 5 MINUTES UP TO 3x AS NEEDED FOR CHEST PAIN, SEEK MEDICAL ATTENTION IF NO RELIEF   Trelegy Ellipta  100-62.5-25 MCG/ACT Aepb Generic drug: Fluticasone -Umeclidin-Vilant 1 inhalation once daily brush teeth and tongue and rinse mouth after each use.        Disposition and follow-up:   [name] was discharged from [facility] in Stable condition.  At the hospital follow up visit please address:  1.  Follow-up:   a. BRBPR - We held his aspirin  while he was admitted. GI evaluated him and did not think he would be a good candidate for colonoscopy. We started him on anusol  suppository for likely internal hemorrhoids. Please reassess whether patient is still having any bleeding.     b. Osteoarthritis/CPPD/Knee Effusion - Managed with pain control and intra-articular steroid injection which improved his pain. Please reassess pain and outpatient adjunct medications for osteoarthritis given he his CKD and potential GI bleeding which limits NSAIDs.   2.  Labs / imaging needed at time of follow-up: None  3.  Pending labs/ test needing follow-up: None   Follow-up Appointments:  Follow-up Information     Adella Norris, MD. Call in  2 day(s).   Specialty: Internal Medicine Contact information: [address] [place] [address] [phone]                 Hospital Course by problem list: #Acute on Chronic Anemia #BRBPR #History of cecal adenocarcinoma s/p resection in 2008 Initially presented with bright red blood with bowel movement and was found to be FOBT+ by ED provider. Hgb dropped down to 9.3 from 11.3 about a week before  admission. He has a history of cecal adenocarcinoma s/p colonic resection in 2008 as well as normocytic anemia which has actually improved recently per PCP note by Dr. Adella. GI was consulted and signed off recommending conservative management with Anusol  suppositories for possible internal hemorrhoids.  He is not a good candidate for colonoscopy/endoscopy evaluation.  He did not have any bleeding episodes during his hospitalization.  On day of discharge, hemoglobin improved to 10.0 which is around his baseline.  #Suspected pseudogout #Moderate right knee effusion with Baker's cyst #Severe patellofemoral osteoarthritis Patient presented with 1 week of right knee pain and swelling to the point where he is unable to ambulate.  He has a prior history of knee surgery 15 years ago.  Exam showed swelling with some warmth and mild tenderness to right knee.  CT imaging findings showed chronic patellar deformity consistent with prior history of surgery as well as severe patellofemoral osteoarthritis and a moderate knee effusion with Baker's cyst.  There is also meniscal chondrocalcinosis favoring CPPD arthropathy.  Orthopedics was consulted and initially received conservative management with pain control, however, the patient still had pain and received 1 steroid injection.  This severely improved his pain and he was able to ambulate.  Given the patient's history of CKD and potential GI bleed, NSAIDs were avoided.  During his stay he received Voltaren  gel, acetaminophen , oxycodone  immediate release 5 mg.   Chronic Stable Medical Conditions:   #CAD s/p PCI (2015, 2008) #CVA #PAD Patient has reported history of STEMI and CAD found with PCI in 2015 and 2008.  He also has a reported history of stroke, although I am unable to see any brain imaging or notes regarding this.  Recent arterial ultrasound with ABI showing moderate bilateral peripheral arterial disease.  No obvious neurologic deficits on exam during this  hospitalization.  Lipid panel from 11/09/2023 showing LDL at goal < 70. No acute concerns this admission.  We continued his home medications with the exception of aspirin  due to concern for GI bleed.  Aspirin  was restarted upon discharge.  #Chronic Diastolic HF Last echo in 2019 showing normal EF with grade 1 diastolic dysfunction.  Mild LVH.  Sclerotic aortic valve with mild aortic insufficiency.  Patient appeared euvolemic on exam.  No acute concerns during this admission.   #Hypertension BP has been increased to 152/61 during this admission.  Unclear if he has been taking his medications at home.  We continued his home medications.   #CKD4 Creatinine on admission of 2.26 which appears improved from prior values.  Baseline appears to be 2.2-2.3.  No acute concerns during this hospitalization.   #Hypoalbuminemia #Frailty Albumin on admission of 3.0.  Patient appearing frail likely high fall risk due to pain in the knee.  Likely in the setting of poor p.o. intake.  During this admission we gave Ensure protein supplement drinks.   #COPD No acute concerns during this hospitalization.  He uses Trelegy Ellipta  inhaler at home and we do not have this on formulary.  We started him  on Breztri  while he was in the hospital.  Switched him back to home inhaler upon discharge.  #HLD We continued his home atorvastatin .   Discharge Subjective: [name] reports his pain is improved from when he came here.  He is actually able to ambulate now.  RN notified us  of potential altered mental status earlier this morning.  However, the patient is oriented to and at baseline per sister who is at bedside.  Occasionally, the patient will have episodes of confusion but this is normal.  Has not noticed any more episodes of bleeding in his stool. Patient is medically ready for discharge.  Discharge Exam:   BP (!) 145/108   Pulse 86   Temp 98 F (36.7 C) (Oral)   Resp 17   Ht 5' 8 (1.727 m)   Wt 67.1 kg    SpO2 100%   BMI 22.50 kg/m  Physical Exam: Constitutional: Well-appearing, undernourished male sitting in recliner in no acute distress HENT: normocephalic atraumatic, mucous membranes moist Eyes: conjunctiva non-erythematous, PERRL, no scleral icterus Cardiovascular: regular rate and rhythm, no m/r/g Pulmonary/Chest: normal work of breathing on room air, lungs CTAB Abdominal: soft, non-tender, non-distended, bowel sounds normal MSK: moderate swelling of right knee compared to left without erythema or tenderness to palpation; transverse scar noted to mid-patella of right knee; left knee appears grossly normal Neurological: alert & oriented to person, place, situation, year, month; requires some redirection though one of them the dialysis people fuck it up for us  he is was to get tunneled dialysis catheter by IR and they were brought to like IR was like asking for him and then they relate the dialysis regularly he is going to dialysis right now like he could have done it after the tunneled catheter was 1 place and then he could leave so stupid Skin: warm and dry Extremities: no edema or cyanosis; peripheral pulses intact Psych: normal mood and affect, thought content normal   Pertinent Labs, Studies, and Procedures:     Latest Ref Rng & Units 11/22/2023    6:24 AM 11/21/2023    1:39 AM 11/20/2023    4:10 AM  CBC  WBC 4.0 - 10.5 K/uL 11.2  6.3  6.5   Hemoglobin 13.0 - 17.0 g/dL 89.9  8.8  89.6   Hematocrit 39.0 - 52.0 % 30.9  27.8  32.5   Platelets 150 - 400 K/uL 197  171  152        Latest Ref Rng & Units 11/22/2023    6:24 AM 11/20/2023    1:23 PM 11/19/2023   12:38 PM  CMP  Glucose 70 - 99 mg/dL 888  98  94   BUN 8 - 23 mg/dL 61  43  41   Creatinine 0.61 - 1.24 mg/dL 7.43  7.82  7.73   Sodium 135 - 145 mmol/L 134  133  136   Potassium 3.5 - 5.1 mmol/L 4.6  4.0  4.3   Chloride 98 - 111 mmol/L 103  104  104   CO2 22 - 32 mmol/L 21  22  22    Calcium  8.9 - 10.3 mg/dL 89.3  9.7  89.7    Total Protein 6.5 - 8.1 g/dL   7.3   Total Bilirubin 0.0 - 1.2 mg/dL   0.9   Alkaline Phos 38 - 126 U/L   87   AST 15 - 41 U/L   16   ALT 0 - 44 U/L   9  CT Knee Right Wo Contrast Result Date: 11/19/2023 CLINICAL DATA:  Right knee pain over the last 4 days. Knee effusion. EXAM: CT OF THE RIGHT KNEE WITHOUT CONTRAST TECHNIQUE: Multidetector CT imaging of the right knee was performed according to the standard protocol. Multiplanar CT image reconstructions were also generated. RADIATION DOSE REDUCTION: This exam was performed according to the departmental dose-optimization program which includes automated exposure control, adjustment of the mA and/or kV according to patient size and/or use of iterative reconstruction technique. COMPARISON:  Radiographs 11/19/2023 FINDINGS: Bones/Joint/Cartilage Meniscal chondrocalcinosis along with chondrocalcinosis along the proximal popliteus tendon. Deformity in the upper half of the patella with prominent bony volume loss and scalloped confluent erosions and cyst-like appearance along the upper margin. Thickened and broadened distal quadriceps tendon attaching in the vicinity of this irregular upper patellar margin with scattered calcifications and small ossifications within the quadriceps tendon. Much of this appears to be chronic with a roughly similar although less severe appearance on 02/28/2022. Correlate with history of patellar injury or surgery in the past. Large geodes or degenerative subcortical cysts along the femoral trochlear groove, especially along the medial facet but also along the lateral facet. Full-thickness loss of patellofemoral articular cartilage compatible with severe osteoarthritis. No fracture or acute bony findings identified. Moderate knee effusion. Ligaments Suboptimally assessed by CT. Muscles and Tendons Expanded and broadened distal quadriceps tendon with internal calcifications and ossifications as noted above. Probably this may be a  manifestation of CPPD arthropathy and chronic fragmentation of upper patellar spurring. Soft tissues Small to moderate size Baker's cyst. Atherosclerosis. Borderline thickened iliotibial band. IMPRESSION: 1. Chronic deformity in the upper half of the patella with prominent bony volume loss and scalloped confluent erosions and cyst-like appearance along the upper margin. Thickened and broadened distal quadriceps tendon attaching in the vicinity of this irregular upper patellar margin with scattered calcifications and small ossifications within the quadriceps tendon. Much of this appears to be chronic with a roughly similar although less severe appearance on 02/28/2022. Correlate with history of patellar injury or surgery in the past. 2. Severe patellofemoral osteoarthritis. 3. Moderate knee effusion with small to moderate size Baker's cyst. 4. Meniscal chondrocalcinosis along with chondrocalcinosis along the proximal popliteus tendon. Appearance favors CPPD arthropathy. 5. Atherosclerosis. 6. Borderline thickened iliotibial band. Electronically Signed   By: Ryan Salvage M.D.   On: 11/19/2023 15:49   DG Knee Complete 4 Views Right Result Date: 11/19/2023 CLINICAL DATA:  Four day history of right-sided knee pain. No known injury. EXAM: RIGHT KNEE - COMPLETE 4 VIEW COMPARISON:  Right knee radiograph dated 02/28/2022 FINDINGS: There are no findings of fracture or dislocation. Moderate joint effusion. Similar degenerative changes of the knee, most pronounced in the patellofemoral compartment with similar fragmentation at the insertion of the quadriceps tendon. Small focus of the cortical lucency involving the lateral aspect of the medial femoral condyle. Soft tissues are unremarkable. IMPRESSION: 1. Moderate joint effusion. 2. Small focus of cortical lucency involving the lateral aspect of the medial femoral condyle, which may represent an osteochondral defect. 3. Similar degenerative changes of the knee, most  pronounced in the patellofemoral compartment. Electronically Signed   By: Limin  Xu M.D.   On: 11/19/2023 13:19     Discharge Instructions:   Discharge Instructions      To [name] or their caretakers,  You were recently admitted to [facility] for bright red blood in your stool.  You were seen by our gastroenterology specialists who did not recommend any  further workup with colonoscopy or endoscopy.  Your bleeding may be related to internal hemorrhoids.  You were also treated for a buildup of fluid in your right knee/osteoarthritis/pseudogout. Your knee pain was treated with a steroid injection as well as acetaminophen  and Voltaren  gel. Please do not take oral NSAIDs such as ibuprofen /Advil /Motrin  due to your kidney disease.  Continue taking your home medications with the following changes:  Start taking Acetaminophen  (Tylenol ) 1000mg  every 6 hours as needed for mild pain Diclofenac  Sodium (Voltaren ) 1% Gel - apply 4g to knee up to 4 times daily Hydrocortisone  (Anusol -HC) 25 mg suppository twice daily  You should seek further medical care if you experience fatigue, shortness of breath, and worsening of bright red blood in your stool or dark tarry stools.  We recommend that you also see your primary care doctor in about a week to make sure that you continue to improve. We are so glad that you are feeling better.  Sincerely,  [facility]      Signed:  Letha Cheadle, MD Internal Medicine Resident, PGY-1 11/22/2023, 3:32 PM Please contact the on call pager after 5 pm and on weekends at [phone].

## 2023-11-22 NOTE — TOC Transition Note (Signed)
 Transition of Care [facility]) - Discharge Note   Patient Details  Name: [name] MRN: [id number] Date of Birth: 07/13/1930  Transition of [facility]) CM/SW Contact:  Roxie KANDICE Stain, RN Phone Number: 11/22/2023, 11:45 AM   Clinical Narrative:    Spoke to patient's daughter, regarding transition needs. Spoke to daughter , [name], who states patient has a rollator, BSC. Address, Phone number and PCP verified. No ICM (Inpatient Care Management) needs.    Final next level of care: Home/Self Care Barriers to Discharge: Barriers Resolved   Patient Goals and CMS Choice Patient states their goals for this hospitalization and ongoing recovery are:: return home          Discharge Placement             Home          Discharge Plan and Services Additional resources added to the After Visit Summary for                                       Social Drivers of Health (SDOH) Interventions SDOH Screenings   Food Insecurity: Patient Unable To Answer (06/20/2022)  Housing: Low Risk  (06/20/2022)  Transportation Needs: No Transportation Needs (06/20/2022)  Utilities: Not At Risk (06/20/2022)  Tobacco Use: High Risk (11/19/2023)     Readmission Risk Interventions     No data to display

## 2023-11-22 NOTE — Plan of Care (Signed)

## 2023-11-22 NOTE — Plan of Care (Signed)

## 2023-11-22 NOTE — TOC CM/SW Note (Signed)
 Transition of Care [facility]) - Inpatient Brief Assessment   Patient Details  Name: [name] MRN: [id number] Date of Birth: 22-Apr-1930  Transition of Care [facility]) CM/SW Contact:    Lauraine FORBES Saa, LCSWA Phone Number: 11/22/2023, 9:24 AM   Clinical Narrative:  9:24 AM Per chart review, patient resides at home alone. Patient has a PCP and insurance. Patient has SNF history with [facility] and [facility]. Patient has HH history with [facility], [facility] and [facility]. Patient has DME (walker, shower seat, BSC, rolling walker) history. Patient's preferred pharmacy's are [facility] and [facility]. Patient is not fully oriented and unable to answer SDOH/admission questions. No TOC needs identified. TOC will continue to follow and be available to assist.  Transition of Care Asessment: Insurance and Status: Insurance coverage has been reviewed Patient has primary care physician: Yes Home environment has been reviewed: Private Residence Prior level of function:: Needs Assistance Prior/Current Home Services: No current home services Social Drivers of Health Review:  (Patient unable to answer) Readmission risk has been reviewed: Yes (Currently Observation Status) Transition of care needs: no transition of care needs at this time

## 2023-11-22 NOTE — Discharge Instructions (Addendum)
 To Asher Kerns or their caretakers,  You were recently admitted to [facility] for bright red blood in your stool.  You were seen by our gastroenterology specialists who did not recommend any further workup with colonoscopy or endoscopy.  Your bleeding may be related to internal hemorrhoids.  You were also treated for a buildup of fluid in your right knee/osteoarthritis/pseudogout. Your knee pain was treated with a steroid injection as well as acetaminophen  and Voltaren  gel. Please do not take oral NSAIDs such as ibuprofen /Advil /Motrin  due to your kidney disease.  Continue taking your home medications with the following changes:  Start taking Acetaminophen  (Tylenol ) 1000mg  every 6 hours as needed for mild pain Diclofenac  Sodium (Voltaren ) 1% Gel - apply 4g to knee up to 4 times daily Hydrocortisone  (Anusol -HC) 25 mg suppository twice daily  You should seek further medical care if you experience fatigue, shortness of breath, and worsening of bright red blood in your stool or dark tarry stools.  We recommend that you also see your primary care doctor in about a week to make sure that you continue to improve. We are so glad that you are feeling better.  Sincerely,  [facility]

## 2023-11-22 NOTE — Progress Notes (Incomplete)
 IV removed.  Discharge edu and instructions given to patient and daughter.  Patient wheeled out of the unit for home.

## 2023-11-24 ENCOUNTER — Telehealth: Payer: Self-pay | Admitting: Internal Medicine

## 2023-11-24 NOTE — Telephone Encounter (Signed)
 Patient missed an appointment on 11/17/23 for ear cleaning, patient was rescheduled for 02/24/24 .  Patient would like a sooner appointment .

## 2023-12-03 ENCOUNTER — Ambulatory Visit: Admitting: Podiatry

## 2023-12-09 ENCOUNTER — Other Ambulatory Visit: Payer: Self-pay | Admitting: Internal Medicine

## 2023-12-22 NOTE — Telephone Encounter (Signed)
Patient was seen and discussed.

## 2024-01-08 ENCOUNTER — Ambulatory Visit: Payer: Self-pay | Admitting: Internal Medicine

## 2024-01-17 ENCOUNTER — Emergency Department (HOSPITAL_COMMUNITY)
Admission: EM | Admit: 2024-01-17 | Discharge: 2024-01-17 | Disposition: A | Discharge status: Home / Self Care | Attending: Emergency Medicine | Admitting: Emergency Medicine

## 2024-01-17 ENCOUNTER — Other Ambulatory Visit: Payer: Self-pay

## 2024-01-17 ENCOUNTER — Emergency Department (HOSPITAL_COMMUNITY)

## 2024-01-17 ENCOUNTER — Encounter (HOSPITAL_COMMUNITY): Payer: Self-pay

## 2024-01-17 DIAGNOSIS — M10041 Idiopathic gout, right hand: Secondary | ICD-10-CM | POA: Insufficient documentation

## 2024-01-17 DIAGNOSIS — Z85038 Personal history of other malignant neoplasm of large intestine: Secondary | ICD-10-CM | POA: Diagnosis not present

## 2024-01-17 DIAGNOSIS — F17210 Nicotine dependence, cigarettes, uncomplicated: Secondary | ICD-10-CM | POA: Insufficient documentation

## 2024-01-17 DIAGNOSIS — Z8546 Personal history of malignant neoplasm of prostate: Secondary | ICD-10-CM | POA: Diagnosis not present

## 2024-01-17 DIAGNOSIS — Z79899 Other long term (current) drug therapy: Secondary | ICD-10-CM | POA: Diagnosis not present

## 2024-01-17 DIAGNOSIS — I130 Hypertensive heart and chronic kidney disease with heart failure and stage 1 through stage 4 chronic kidney disease, or unspecified chronic kidney disease: Secondary | ICD-10-CM | POA: Diagnosis not present

## 2024-01-17 DIAGNOSIS — I2510 Atherosclerotic heart disease of native coronary artery without angina pectoris: Secondary | ICD-10-CM | POA: Insufficient documentation

## 2024-01-17 DIAGNOSIS — N184 Chronic kidney disease, stage 4 (severe): Secondary | ICD-10-CM | POA: Diagnosis not present

## 2024-01-17 DIAGNOSIS — I5032 Chronic diastolic (congestive) heart failure: Secondary | ICD-10-CM | POA: Insufficient documentation

## 2024-01-17 DIAGNOSIS — J449 Chronic obstructive pulmonary disease, unspecified: Secondary | ICD-10-CM | POA: Insufficient documentation

## 2024-01-17 DIAGNOSIS — M7989 Other specified soft tissue disorders: Secondary | ICD-10-CM | POA: Diagnosis present

## 2024-01-17 LAB — CBC WITH DIFFERENTIAL/PLATELET
Abs Immature Granulocytes: 0.01 K/uL (ref 0.00–0.07)
Basophils Absolute: 0.1 K/uL (ref 0.0–0.1)
Basophils Relative: 1 %
Eosinophils Absolute: 0.8 K/uL — ABNORMAL HIGH (ref 0.0–0.5)
Eosinophils Relative: 12 %
HCT: 35.3 % — ABNORMAL LOW (ref 39.0–52.0)
Hemoglobin: 11.1 g/dL — ABNORMAL LOW (ref 13.0–17.0)
Immature Granulocytes: 0 %
Lymphocytes Relative: 18 %
Lymphs Abs: 1.2 K/uL (ref 0.7–4.0)
MCH: 28.2 pg (ref 26.0–34.0)
MCHC: 31.4 g/dL (ref 30.0–36.0)
MCV: 89.6 fL (ref 80.0–100.0)
Monocytes Absolute: 0.6 K/uL (ref 0.1–1.0)
Monocytes Relative: 9 %
Neutro Abs: 4 K/uL (ref 1.7–7.7)
Neutrophils Relative %: 60 %
Platelets: 230 K/uL (ref 150–400)
RBC: 3.94 MIL/uL — ABNORMAL LOW (ref 4.22–5.81)
RDW: 14.2 % (ref 11.5–15.5)
WBC: 6.6 K/uL (ref 4.0–10.5)
nRBC: 0 % (ref 0.0–0.2)

## 2024-01-17 LAB — I-STAT CHEM 8, ED
BUN: 28 mg/dL — ABNORMAL HIGH (ref 8–23)
Calcium, Ion: 1.12 mmol/L — ABNORMAL LOW (ref 1.15–1.40)
Chloride: 113 mmol/L — ABNORMAL HIGH (ref 98–111)
Creatinine, Ser: 2.4 mg/dL — ABNORMAL HIGH (ref 0.61–1.24)
Glucose, Bld: 98 mg/dL (ref 70–99)
HCT: 35 % — ABNORMAL LOW (ref 39.0–52.0)
Hemoglobin: 11.9 g/dL — ABNORMAL LOW (ref 13.0–17.0)
Potassium: 3.9 mmol/L (ref 3.5–5.1)
Sodium: 137 mmol/L (ref 135–145)
TCO2: 20 mmol/L — ABNORMAL LOW (ref 22–32)

## 2024-01-17 LAB — BASIC METABOLIC PANEL WITH GFR
Anion gap: 14 (ref 5–15)
BUN: 28 mg/dL — ABNORMAL HIGH (ref 8–23)
CO2: 17 mmol/L — ABNORMAL LOW (ref 22–32)
Calcium: 10.4 mg/dL — ABNORMAL HIGH (ref 8.9–10.3)
Chloride: 107 mmol/L (ref 98–111)
Creatinine, Ser: 2.04 mg/dL — ABNORMAL HIGH (ref 0.61–1.24)
GFR, Estimated: 30 mL/min — ABNORMAL LOW (ref >60)
Glucose, Bld: 95 mg/dL (ref 70–99)
Potassium: 4 mmol/L (ref 3.5–5.1)
Sodium: 138 mmol/L (ref 135–145)

## 2024-01-17 LAB — URIC ACID: Uric Acid, Serum: 10.9 mg/dL — ABNORMAL HIGH (ref 3.7–8.6)

## 2024-01-17 MED ORDER — OXYCODONE-ACETAMINOPHEN 5-325 MG PO TABS
1.0000 | ORAL_TABLET | Freq: Once | ORAL | Status: AC
  Administered 2024-01-17: 1 via ORAL
  Filled 2024-01-17: qty 1

## 2024-01-17 MED ORDER — PREDNISONE 20 MG PO TABS: 40.0000 mg | ORAL_TABLET | Freq: Every day | ORAL | 0 refills | Status: AC

## 2024-01-17 MED ORDER — PREDNISONE 20 MG PO TABS
40.0000 mg | ORAL_TABLET | Freq: Once | ORAL | Status: AC
  Administered 2024-01-17: 40 mg via ORAL
  Filled 2024-01-17: qty 2

## 2024-01-17 MED ORDER — OXYCODONE-ACETAMINOPHEN 5-325 MG PO TABS: 1.0000 | ORAL_TABLET | Freq: Three times a day (TID) | ORAL | 0 refills | Status: AC | PRN
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
## 2024-01-17 NOTE — Discharge Instructions (Addendum)
 It was a pleasure caring for you today in the emergency department.  Please take the prednisone  daily for the next 4 days for your gout flare.  You can also use the Percocet as needed for pain management.  Please do not take any ibuprofen  or NSAIDs given your chronic kidney disease.  Please follow-up with your PCP if continued symptoms and return to the ED if severely worsening.  We referred you to the ENT specialist for your ongoing hoarseness.  Please call [facility] specialist to schedule an appointment for evaluation.  Please return to the emergency department for any worsening or worrisome symptoms.

## 2024-01-17 NOTE — ED Provider Triage Note (Signed)
 Emergency Medicine Provider Triage Evaluation Note  [name] , a 89 y.o. male  was evaluated in triage.  Pt complains of left hand pain and swelling.  Patient is not forthcoming with significant details.  He reports that his left hand is painful and swollen since yesterday.  He denies trauma.  Patient with apparent history of pseudogout.  Mildly elevated serum uric acid on prior testing.  Review of Systems  Positive: Left hand pain and swelling Negative: Fever, trauma  Physical Exam  BP (!) 167/74 (BP Location: Right Arm)   Pulse 95   Temp (!) 97.5 F (36.4 C)   Resp 18   SpO2 100%  Gen:   Awake, no distress   Resp:  Normal effort  MSK:   Moves extremities without difficulty , diffuse edema to the left hand Other:    Medical Decision Making  Medically screening exam initiated at 9:47 AM.  Appropriate orders placed.  [name] was informed that the remainder of the evaluation will be completed by another provider, this initial triage assessment does not replace that evaluation, and the importance of remaining in the ED until their evaluation is complete.     [name], MD 01/17/24 [phone]

## 2024-01-17 NOTE — ED Triage Notes (Signed)
 Pt has hoarseness x 1 week. Pt has swelling in left hand since yesterday. Denies injury.

## 2024-02-07 ENCOUNTER — Other Ambulatory Visit: Payer: Self-pay | Admitting: Internal Medicine

## 2024-02-11 ENCOUNTER — Other Ambulatory Visit: Payer: Self-pay | Admitting: Internal Medicine

## 2024-02-24 ENCOUNTER — Ambulatory Visit: Admitting: Internal Medicine

## 2024-03-08 ENCOUNTER — Telehealth: Payer: Self-pay | Admitting: Internal Medicine

## 2024-03-08 NOTE — Telephone Encounter (Signed)
Called patient to offer appointment, patient did not answer.

## 2024-03-08 NOTE — Telephone Encounter (Signed)
 Patient's daughter called and states she would like for patient to be seen sooner that 04/14/23,  Daughter states patient keeps getting swelling of his legs , patient has been admitted to the hospital before and was told this was due to Gout and was prescribed steroids,  Daughter states she would like for patient to be seen to see if he can be prescribed a medication that he can take daily.   We will call patient when have the next cancellation.

## 2024-03-13 NOTE — Telephone Encounter (Signed)
 Called patient to offer appointment, patient did not answer . Lvm about appointment available 03/13/24.

## 2024-03-14 NOTE — Telephone Encounter (Signed)
 Patient's daughter called and rescheduled appointment, appointment was rescheduled for 05/03/24,  Daughter called and asked for patient to be on wait list for a sooner appointment.  Called patient couple of times to offer appointment, patient has not answered.

## 2024-03-14 NOTE — Telephone Encounter (Signed)
 Called patient to daughter's number to offer appointment, patient did not answer.

## 2024-03-15 ENCOUNTER — Telehealth: Payer: Self-pay

## 2024-03-15 NOTE — Telephone Encounter (Signed)
 She called today to return call. She said the reason she missed our call was the it showed a scam call. Now she is saving our number to receive our call.

## 2024-03-17 NOTE — Telephone Encounter (Signed)
Called patient to offer appointment, patient did not answer.

## 2024-03-20 ENCOUNTER — Encounter: Payer: Self-pay | Admitting: Internal Medicine

## 2024-03-20 ENCOUNTER — Ambulatory Visit (INDEPENDENT_AMBULATORY_CARE_PROVIDER_SITE_OTHER): Admitting: Internal Medicine

## 2024-03-20 VITALS — BP 140/60 | HR 60 | Resp 10 | Ht 68.0 in | Wt 123.0 lb

## 2024-03-20 DIAGNOSIS — G8929 Other chronic pain: Secondary | ICD-10-CM

## 2024-03-20 DIAGNOSIS — K625 Hemorrhage of anus and rectum: Secondary | ICD-10-CM | POA: Diagnosis not present

## 2024-03-20 DIAGNOSIS — E739 Lactose intolerance, unspecified: Secondary | ICD-10-CM

## 2024-03-20 DIAGNOSIS — H6123 Impacted cerumen, bilateral: Secondary | ICD-10-CM | POA: Diagnosis not present

## 2024-03-20 DIAGNOSIS — K529 Noninfective gastroenteritis and colitis, unspecified: Secondary | ICD-10-CM

## 2024-03-20 DIAGNOSIS — R350 Frequency of micturition: Secondary | ICD-10-CM | POA: Diagnosis not present

## 2024-03-20 DIAGNOSIS — M25561 Pain in right knee: Secondary | ICD-10-CM | POA: Diagnosis not present

## 2024-03-20 DIAGNOSIS — R490 Dysphonia: Secondary | ICD-10-CM | POA: Diagnosis not present

## 2024-03-20 MED ORDER — PREDNISONE 20 MG PO TABS: ORAL_TABLET | ORAL | 0 refills | Status: AC

## 2024-03-20 MED ORDER — FLUCONAZOLE 100 MG PO TABS: ORAL_TABLET | ORAL | 0 refills | Status: AC

## 2024-03-20 NOTE — Telephone Encounter (Signed)
 Patient has been seen.

## 2024-03-20 NOTE — Progress Notes (Signed)
 "  Acute Office Visit  Subjective:  I, MD in training under Dr. Adella, did initial H/P then presented to Dr. Adella. She did her own evaluation and did the final A/P, orders, discharge instructions. Pt's caregiver expressed understanding.   Pt accompanied by his grand-daughter who is his caregiver. History from [facility], pt and his daughter. Pt's caregiver is the main historian.    Patient ID: [name], male    DOB: Mar 18, 1930, 89 y.o.   MRN: [id number]  Chief Complaint  Patient presents with   Leg Pain    Right leg pain and right foot swollen    HPI 1-R KNEE PAIN- 1 month duration. Gradual onset.  Hurts when he walks.  Grand-daughter gave him Oxycodone  twice, Tylenol , Voltaran rub with no adequate control; Oxycodone  left from previous doctor visits. Has an intermittent h/o this: Started at the entire right foot last year. Saw Dr. CHRISTELLA and was given prednisone  and podiatry referral for toe nail management. Sxs eent to baseline for 2 months.  Then R knee and R foot got swollen and painful and he could not walk.  Was hospitalized for it for 3 days and was diagnosed with gout per daughter, discharged on prednisone  and tylenol  and could walk.  Then the swelling and pain left for 4 months,   Then had enitre right hand swelling. Went to the ED, given prednisone  and it went away.   Now has 1 month of right knee pain and swelling, can walk but it hurts.     2- BRBPR - yesterday noted by daughter, quantity 1 teaspoon, some on the tissue, mixed in with stool on the diaper.  NO maroon colored stool. Pt does not know because he does not look in his diaper.  Last time daughter noticed the same it was  a year ago.  +H/o internal hemorrhoids per GI per Dr. Adella. No pain when he poops.  No syncope. No dark stools. Fall last week but it was  trip and fall and hit his head right forehead. Has usual forgetfulness but not worsening.  No change in walking and behavior.  Acting like himself since per  caregiver.  3- HOARSENESS for a year; on Trilogy. Says he gurgles after using the meds. 60+ pack year h/o tobacco.  COPD. Has not been evaluated fully.     Patient Active Problem List   Diagnosis Date Noted   Calcium  pyrophosphate deposition disease (CPPD) 11/20/2023   Left knee pain 11/20/2023   Bright red blood per rectum 11/19/2023   Bradycardia 11/11/2023   Anemia 11/11/2023   Dyslipidemia 11/11/2023   PAD (peripheral artery disease) 11/11/2023   Bilateral hearing loss due to cerumen impaction 11/11/2023   Gout of right foot 07/28/2023   Seasonal allergies 08/03/2022   Protein-calorie malnutrition, severe 06/23/2022   Major depressive disorder with current active episode 06/23/2022   Mild protein-calorie malnutrition 06/22/2022   COVID-19 06/20/2022   AKI (acute kidney injury) 03/01/2022   Generalized weakness 03/01/2022   Right knee pain 03/01/2022   Pulmonary nodule 03/01/2022   Hypercalcemia 03/01/2022   History of CVA (cerebrovascular accident) 03/01/2022   Incarcerated inguinal hernia 05/02/2021   Incarcerated right inguinal hernia 05/02/2021   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)    Right inguinal hernia 11/29/2019   Decreased hearing, bilateral 11/29/2019   CKD stage 3b, GFR 30-44 ml/min (HCC) 08/29/2019   Hyperparathyroidism 06/20/2019   Atypical chest pain 01/20/2018   Right carotid bruit 07/07/2017   Tobacco abuse 05/26/2017  Chest pain 04/27/2016   Unstable angina (HCC) 04/26/2016   Cerebellar ataxia (HCC) 10/02/2015   Ataxic gait 09/18/2015   History of prostate cancer 09/18/2015   Prediabetes 07/29/2015   Chronic kidney disease (CKD), stage IV (severe) (HCC) 04/19/2015   Chronic diastolic HF (heart failure) (HCC)    COPD (chronic obstructive pulmonary disease) (HCC) 02/10/2014   Coronary artery disease 01/25/2013    Class: Chronic   Primary hypertension 01/25/2013    Class: Chronic   Mixed hyperlipidemia 01/25/2013    Class: Chronic    History of colon cancer 05/19/2006   Kidney stones 1954    Allergies[1] ( Hover on the dots) Active Medications[2] ( Hover on the dots; diflucan  and prednisone  put in at discharge)  Review of Systems  Constitutional:  Positive for weight loss (wt was 158 11/25 and 128 1/26 in 2 months.).       No change in diet Breakfast : Scrambled eggs and sausage and applesauce or rice- finishes it as usual  Lunch- Usually skips  Dinner- Baked fish and okra and rice and finishes it as ususal  HENT:  Negative for hearing loss.   Respiratory:         Baseline SOB when he walks to the bathroom  Gastrointestinal:  Positive for abdominal pain (occasional pain in the belly per pt's daughter, none now), blood in stool (See HPI) and diarrhea (wear diapers, always loose, all year; h/o lactose intolerance. Drinks a jug of chocolate milk daily).  Genitourinary:  Positive for frequency (10 times a day; sometimes dribbles).  Neurological:  Negative for dizziness.       NO change in baseline  Psychiatric/Behavioral:  Positive for memory loss (unchanged for a while).        Objective:     Vitals:   03/20/24 1226  BP: (!) 140/60  Pulse: 60  Resp: 10  Weight= 123 pounds Weight 158 pounds 11/25 Weight in clinic 108 6/25, 118 9/25   Physical Exam Vitals reviewed.  Constitutional:      General: He is not in acute distress.    Appearance: Normal appearance. He is not ill-appearing, toxic-appearing or diaphoretic.     Comments: Thin, pleasant, difficulty hearing, caregiver answers most of his questions Voice is hoarse  HENT:     Ears:     Comments: B cerumen impaction per Dr. CHRISTELLA.   Have to speak loud for pt to hear    Mouth/Throat:     Comments: ? White material on top of the entire tongue. R/o thrush Neck:     Comments: No masses or stridor Cardiovascular:     Heart sounds: Normal heart sounds.  Pulmonary:     Effort: Pulmonary effort is normal.     Comments: Occasional rhonchi, distant  sounds Musculoskeletal:     Comments: R knee with general bony enlargement, dec rom due to pain, very small anterior effusion, walks with a walker. Has horizontal, well-healed surgical scar. L knee smaller with no effusion and intact ROM.  Right food and ankle- no swelling  Skin:    Findings: No bruising, lesion or rash.     Comments: Slight increase in warmth of right knee but no redness   Neurological:     Mental Status: He is alert.     Comments: Ambulating with his walker and responds to questions appropriately to best of his recall.  Asks questions at times and voices concerns about his health issues.   Psychiatric:  Mood and Affect: Mood normal.        Behavior: Behavior normal.        Judgment: Judgment normal.        Assessment & Plan:     1- Chronic hoarseness for a year at least, h/o tobacco, on steroid inhaler-  R/O thrush, R/O other pathology -Diflucan  10 days ordered -ENT Referral placed   2- Right knee pain for a month, h/o same, h/o right knee injury/surgery, CRPP-  -Prednisone  40 mg po 5 days. -Cannot tolerate NSAIDS due to kidney disease. Tylenol  and Voltaran  gel and oxycodone  ineffective. -Dr CHRISTELLA. Offered pt injection but pt declined.   3- B cerumen impaction, dec hearing- schedule for flushing the ear  4- Chronic Diarrhea- Lactose Intolerance- Lactaid when drinking his chocolate milk  5- Weight loss- Error. The high numbers are during an ED visit. The numbers here are 108,118, 123 today.   6- Chronic Urinary Frequency- See Note by Dr. Adella.  7- Rectal Bleeding- Most likely hemorrhoids.  H/o same. Monitor.   8- Flu and Covid due- When pt comes in for ear irrigation, we can offer vaccines   Also see by Dr. Adella  RTC PRN and for CPE.  Caron Kaiser, MD      [1] No Known Allergies [2]  Current Meds  Medication Sig   acetaminophen  (TYLENOL ) 500 MG tablet Take 2 tablets (1,000 mg total) by mouth every 6 (six) hours as needed for mild  pain (pain score 1-3).   albuterol  (VENTOLIN  HFA) 108 (90 Base) MCG/ACT inhaler Inhale 2 puffs into the lungs every 6 hours as needed for wheezing or shortness of breath.   amLODipine  (NORVASC ) 10 MG tablet TAKE 1 TABLET BY MOUTH EVERY DAY WITH EVENING MEAL   ASPIRIN  EC ADULT LOW DOSE 81 MG tablet TAKE 1 TABLET BY MOUTH EVERY MORNING WITH A meal   atorvastatin  (LIPITOR ) 80 MG tablet TAKE 1 TABLET BY MOUTH EVERY DAY with EVENING MEAL   diclofenac  Sodium (VOLTAREN ) 1 % GEL Apply 4 g topically 4 (four) times daily.   fluconazole  (DIFLUCAN ) 100 MG tablet 2 tabs by mouth today and then 1 tab by mouth daily for 9 more days.   Fluticasone -Umeclidin-Vilant (TRELEGY ELLIPTA ) 100-62.5-25 MCG/ACT AEPB 1 inhalation once daily brush teeth and tongue and rinse mouth after each use.   furosemide  (LASIX ) 20 MG tablet TAKE 1 TABLET BY MOUTH ONCE DAILY with morning meal   hydrALAZINE  (APRESOLINE ) 25 MG tablet TAKE 1 TABLET BY MOUTH 2 TIMES DAILY with morning and evening meals   hydrocortisone  (ANUSOL -HC) 25 MG suppository Unwrap and place 1 suppository (25 mg total) rectally 2 (two) times daily.   isosorbide  mononitrate (IMDUR ) 120 MG 24 hr tablet TAKE 1 TABLET BY MOUTH EVERY DAY WITH DINNER (Patient taking differently: Take 120 mg by mouth every evening.)   loratadine  (CLARITIN ) 10 MG tablet TAKE 1 TABLET BY MOUTH ONCE DAILY AS NEEDED FOR FOR ALLERGY SYMPTOMS   nitroGLYCERIN  (NITROSTAT ) 0.4 MG SL tablet PLACE 1 TABLET UNDER THE TONGUE EVERY 5 MINUTES UP TO 3x AS NEEDED FOR CHEST PAIN, SEEK MEDICAL ATTENTION IF NO RELIEF   predniSONE  (DELTASONE ) 20 MG tablet 2 tabs by mouth once daily in morning with breakfast.   "

## 2024-03-20 NOTE — Progress Notes (Signed)
"   Right knee pain:  appears to mainly be DJD.  He did not want an knee injection today, but did want prednisone .  40 mg daily for 5 days.  2.  Chronic hoarseness that is worsening.  Does have corticosteroid in maintenance inhaler.  He is washing brushing tongue and washing out mouth after each use.  Appears to have thrush on tongue and concerned may be down into throat.  No pain.  When done with prednisone , will start fluconazole  200 mg first day and then 100 mg daily for 10 day course.    3.  Chronic diarrhea with BRBPR:  [name], daughter and home health states he drinks a large amount of cow's milk all day--has always done this and chronic problems with diarrhea.  They have not tried Lactaid or other milk.  This has never been shared with me before. States the blood was in his adult diaper, not really mixed in with stool and had some blood when wiped.  Has had this before, evaluated by GI and did not feel he could tolerate colonoscopy.  Treated him for hemorrhoid [name] will get Lactaid milk or other milk and lactaid for other milk products. Not clear he will be consistent with use, but will try.    4.  Urinary frequency:  No dysuria.  Unable to get urine sample today.   [name] states unable to urinate on command--will send him home with cup and [name] will see if she can get him to urinate in urinal and then put in cup.  5.  Bilateral cerumen impaction:  has appt in 04/2024 for removal.  Can see if time for knee injection then as well.    6.  History of gout with elevated uric acid in November.  Need to get him back when without joint pain that may be gout to get started on   Have discussed all of his chronic health issues and that he would not likely survive a surgery or chemo/radiation if something is found.  They are in agreement to not be aggressive with work ups.  Pt. Seen with Dr Fleta "

## 2024-03-31 ENCOUNTER — Telehealth: Payer: Self-pay | Admitting: Internal Medicine

## 2024-03-31 NOTE — Telephone Encounter (Signed)
 Patient had been scheduled for 04/04/24. Contacted daughter today to notify her of patient's appointment on Tuesday. Daughter today stated that she thought patient was scheduled in February and that she would like to keep that appointment instead.   Notify daughter that appointment was scheduled sooner per Doctor.  Daughter stated that due to the inclement weather she does not want to bring her father out on Tuesday.  Patient's daughter states she would like to keep appoitnment on 05/03/24 instead, daughter states  also that patient has an appointment with ENT on 04/21/24. patient will like to wait after that appointment to be seen.

## 2024-04-03 MED ORDER — ALLOPURINOL 100 MG PO TABS: ORAL_TABLET | ORAL | 11 refills | Status: AC

## 2024-04-04 ENCOUNTER — Ambulatory Visit: Admitting: Internal Medicine

## 2024-04-21 ENCOUNTER — Institutional Professional Consult (permissible substitution) (INDEPENDENT_AMBULATORY_CARE_PROVIDER_SITE_OTHER)

## 2024-05-03 ENCOUNTER — Ambulatory Visit: Admitting: Internal Medicine
